# Patient Record
Sex: Female | Born: 1970 | Race: White | Hispanic: No | Marital: Married | Smoking: Never smoker
Health system: Southern US, Community
[De-identification: ages and names within clinical notes are randomized; demographics above are authoritative.]

## PROBLEM LIST (undated history)

## (undated) DIAGNOSIS — M549 Dorsalgia, unspecified: Secondary | ICD-10-CM

## (undated) DIAGNOSIS — R519 Headache, unspecified: Secondary | ICD-10-CM

## (undated) DIAGNOSIS — G43909 Migraine, unspecified, not intractable, without status migrainosus: Secondary | ICD-10-CM

## (undated) DIAGNOSIS — T7840XA Allergy, unspecified, initial encounter: Secondary | ICD-10-CM

## (undated) DIAGNOSIS — T8859XA Other complications of anesthesia, initial encounter: Secondary | ICD-10-CM

## (undated) DIAGNOSIS — M542 Cervicalgia: Secondary | ICD-10-CM

## (undated) DIAGNOSIS — I7300 Raynaud's syndrome without gangrene: Secondary | ICD-10-CM

## (undated) DIAGNOSIS — G4710 Hypersomnia, unspecified: Secondary | ICD-10-CM

## (undated) DIAGNOSIS — M5137 Other intervertebral disc degeneration, lumbosacral region: Secondary | ICD-10-CM

## (undated) DIAGNOSIS — H269 Unspecified cataract: Secondary | ICD-10-CM

## (undated) DIAGNOSIS — N189 Chronic kidney disease, unspecified: Secondary | ICD-10-CM

## (undated) DIAGNOSIS — I10 Essential (primary) hypertension: Secondary | ICD-10-CM

## (undated) DIAGNOSIS — L502 Urticaria due to cold and heat: Secondary | ICD-10-CM

## (undated) DIAGNOSIS — I499 Cardiac arrhythmia, unspecified: Secondary | ICD-10-CM

## (undated) DIAGNOSIS — R768 Other specified abnormal immunological findings in serum: Secondary | ICD-10-CM

## (undated) DIAGNOSIS — K469 Unspecified abdominal hernia without obstruction or gangrene: Secondary | ICD-10-CM

## (undated) DIAGNOSIS — D689 Coagulation defect, unspecified: Secondary | ICD-10-CM

## (undated) DIAGNOSIS — K219 Gastro-esophageal reflux disease without esophagitis: Secondary | ICD-10-CM

## (undated) DIAGNOSIS — K589 Irritable bowel syndrome without diarrhea: Secondary | ICD-10-CM

## (undated) DIAGNOSIS — M797 Fibromyalgia: Secondary | ICD-10-CM

## (undated) DIAGNOSIS — R7689 Other specified abnormal immunological findings in serum: Secondary | ICD-10-CM

## (undated) DIAGNOSIS — E876 Hypokalemia: Secondary | ICD-10-CM

## (undated) DIAGNOSIS — G8929 Other chronic pain: Secondary | ICD-10-CM

## (undated) DIAGNOSIS — M1990 Unspecified osteoarthritis, unspecified site: Secondary | ICD-10-CM

## (undated) DIAGNOSIS — D649 Anemia, unspecified: Secondary | ICD-10-CM

## (undated) DIAGNOSIS — F419 Anxiety disorder, unspecified: Secondary | ICD-10-CM

## (undated) DIAGNOSIS — Z8489 Family history of other specified conditions: Secondary | ICD-10-CM

## (undated) DIAGNOSIS — R42 Dizziness and giddiness: Secondary | ICD-10-CM

## (undated) DIAGNOSIS — N3010 Interstitial cystitis (chronic) without hematuria: Secondary | ICD-10-CM

## (undated) DIAGNOSIS — T782XXA Anaphylactic shock, unspecified, initial encounter: Secondary | ICD-10-CM

## (undated) DIAGNOSIS — Z889 Allergy status to unspecified drugs, medicaments and biological substances status: Secondary | ICD-10-CM

## (undated) DIAGNOSIS — F988 Other specified behavioral and emotional disorders with onset usually occurring in childhood and adolescence: Secondary | ICD-10-CM

## (undated) DIAGNOSIS — F429 Obsessive-compulsive disorder, unspecified: Secondary | ICD-10-CM

## (undated) DIAGNOSIS — E785 Hyperlipidemia, unspecified: Secondary | ICD-10-CM

## (undated) DIAGNOSIS — J45909 Unspecified asthma, uncomplicated: Secondary | ICD-10-CM

## (undated) DIAGNOSIS — M51379 Other intervertebral disc degeneration, lumbosacral region without mention of lumbar back pain or lower extremity pain: Secondary | ICD-10-CM

## (undated) DIAGNOSIS — K429 Umbilical hernia without obstruction or gangrene: Secondary | ICD-10-CM

## (undated) HISTORY — PX: SPINE SURGERY: SHX786

## (undated) HISTORY — PX: COMBINED HYSTEROSCOPY DIAGNOSTIC / D&C: SUR297

## (undated) HISTORY — DX: Allergy, unspecified, initial encounter: T78.40XA

## (undated) HISTORY — DX: Unspecified cataract: H26.9

## (undated) HISTORY — PX: ROTATOR CUFF REPAIR: SHX139

## (undated) HISTORY — PX: HAND SURGERY: SHX662

## (undated) HISTORY — PX: CERVICAL FUSION: SHX112

## (undated) HISTORY — DX: Coagulation defect, unspecified: D68.9

## (undated) HISTORY — PX: COLONOSCOPY: SHX174

## (undated) HISTORY — DX: Hypokalemia: E87.6

## (undated) HISTORY — PX: UPPER GASTROINTESTINAL ENDOSCOPY: SHX188

## (undated) HISTORY — DX: Anaphylactic shock, unspecified, initial encounter: T78.2XXA

## (undated) HISTORY — DX: Hyperlipidemia, unspecified: E78.5

## (undated) HISTORY — PX: LAPAROSCOPIC ENDOMETRIOSIS FULGURATION: SUR769

## (undated) HISTORY — PX: BLADDER SURGERY: SHX569

## (undated) HISTORY — PX: OTHER SURGICAL HISTORY: SHX169

---

## 1998-08-12 ENCOUNTER — Other Ambulatory Visit: Admission: RE | Admit: 1998-08-12 | Discharge: 1998-08-12 | Payer: Self-pay | Admitting: Obstetrics & Gynecology

## 1999-08-25 ENCOUNTER — Other Ambulatory Visit: Admission: RE | Admit: 1999-08-25 | Discharge: 1999-08-25 | Payer: Self-pay | Admitting: Obstetrics & Gynecology

## 1999-10-30 ENCOUNTER — Emergency Department (HOSPITAL_COMMUNITY): Admission: EM | Admit: 1999-10-30 | Discharge: 1999-10-31 | Payer: Self-pay

## 2000-07-31 ENCOUNTER — Encounter: Payer: Self-pay | Admitting: Emergency Medicine

## 2000-07-31 ENCOUNTER — Emergency Department (HOSPITAL_COMMUNITY): Admission: EM | Admit: 2000-07-31 | Discharge: 2000-07-31 | Payer: Self-pay | Admitting: Emergency Medicine

## 2000-09-08 ENCOUNTER — Other Ambulatory Visit: Admission: RE | Admit: 2000-09-08 | Discharge: 2000-09-08 | Payer: Self-pay | Admitting: Obstetrics & Gynecology

## 2000-10-21 ENCOUNTER — Encounter: Payer: Self-pay | Admitting: Emergency Medicine

## 2000-10-21 ENCOUNTER — Emergency Department (HOSPITAL_COMMUNITY): Admission: EM | Admit: 2000-10-21 | Discharge: 2000-10-21 | Payer: Self-pay | Admitting: Emergency Medicine

## 2000-12-06 ENCOUNTER — Emergency Department (HOSPITAL_COMMUNITY): Admission: EM | Admit: 2000-12-06 | Discharge: 2000-12-06 | Payer: Self-pay | Admitting: Emergency Medicine

## 2001-04-10 ENCOUNTER — Inpatient Hospital Stay (HOSPITAL_COMMUNITY): Admission: EM | Admit: 2001-04-10 | Discharge: 2001-04-12 | Payer: Self-pay | Admitting: *Deleted

## 2002-03-06 ENCOUNTER — Other Ambulatory Visit: Admission: RE | Admit: 2002-03-06 | Discharge: 2002-03-06 | Payer: Self-pay | Admitting: Obstetrics & Gynecology

## 2003-01-18 ENCOUNTER — Emergency Department (HOSPITAL_COMMUNITY): Admission: EM | Admit: 2003-01-18 | Discharge: 2003-01-18 | Payer: Self-pay | Admitting: Emergency Medicine

## 2009-01-15 ENCOUNTER — Emergency Department (HOSPITAL_BASED_OUTPATIENT_CLINIC_OR_DEPARTMENT_OTHER): Admission: EM | Admit: 2009-01-15 | Discharge: 2009-01-15 | Payer: Self-pay | Admitting: Emergency Medicine

## 2009-01-15 ENCOUNTER — Ambulatory Visit: Payer: Self-pay | Admitting: Radiology

## 2011-01-28 NOTE — H&P (Signed)
[facility]  Patient:    Samantha [name], Samantha [name] Visit Number: [id number] MRN: [id number]          Service Type: PSY Location: 30 [id number] [id number] Attending Physician:  Denny Peon Dictated by:   Candi Leash. Orsini, N.P. Admit Date:  04/09/2001 Discharge Date: 04/12/2001                     Psychiatric Admission Assessment  IDENTIFYING INFORMATION:  This is a 40 year old married white female, voluntarily admitted on April 09, 2001, for intentional overdose.  HISTORY OF PRESENT ILLNESS:  The patient presents with a history of intentional overdose on 6 Ativan, 7 Xanax, 1 Trazodone on April 09, 2001. Patient reports that she took her medicine because she was "tired of everyones mouth."  She wanted a peaceful sleep for about 12-15 hours. Her intention was not a suicide attempt.  The patient reports she has been having some depressive symptoms, financial concerns, been unemployed for the past 6 months, and grieving over 3 family deaths for the past year.  The patient has been feeling very overwhelmed.  Her sleeping and eating has been satisfactory. She denies any current suicidal or homicidal ideations.  She denies any auditory or visual hallucinations, no paranoia.  Patient is overwhelmed that her coworkers have gotten jobs and she has not, and is worried about getting fired.  PAST PSYCHIATRIC HISTORY:  This is the first admission to [facility].  She sees Dr. Isaac Bliss in [facility].  She was in [facility] in September 2001 for an overdose.  SOCIAL HISTORY:  She is a 40 year old married white female, married for 5 years with no children.  She lives with her husband.  She is out of work.  She has no legal problems.  She has completed high school and 3 years of college.  FAMILY HISTORY:  She has a grandfather and uncle with alcohol issues.  ALCOHOL DRUG HISTORY:  She is a nonsmoker.  She drinks 1-2 beers about 3 times a week, been drinking  since the age of 56, and marijuana use about 10 years ago.  PAST MEDICAL HISTORY:  Primary care Alic Hilburn is Dr. Piedad Climes in [place]. Medical problems are hypoglycemia, interstitial cystitis, irritable bowel syndrome, hiatal hernia and asthma.  MEDICATIONS: 1. Fluoxetine  20 mg 2 caps 3 times a day. 2. Maxifed G 550 CR 1 tab bid 3. Vioxx 25 mg 1 tab q.d. 4. Clarinex 5 mg 1 tab p.o. 5. Nexium 40 mg 1 q.d.  DRUG ALLERGIES:  EES.  PHYSICAL EXAMINATION:  Performed at [facility].  Blood sugar was 74, salicylate level was less than 4, acetaminophen level less than 10. CBC was within normal limits.  Urinalysis showed trace ketones.  Alcohol level was 68.  MENTAL STATUS EXAMINATION:  She is an alert, young adult, cooperative female with psychomotor retardation, fair eye contact, somewhat disheveled.  Speech is soft spoken, relevant, slow to answer.  Mood is depressed, affect is angry and sarcastic initially, then became more pleasant.  Patient was teary-eyed when she was speaking about the deaths in her family.  Thought processes are coherent.  There is no evidence of psychosis, no auditory or visual hallucinations, no suicidal or homicidal ideation, no paranoia.  Cognitively, she is oriented x 3.  Her memory is good, her judgment is impaired.  Insight is fair.  She has poor impulse control.  ADMISSION DIAGNOSES: Axis I:    1. Major depression.  2. Obsessive-compulsive disorder by history. Axis II:   Deferred. Axis III:  Asthma, gastroesophageal reflux disease and irritable bowel            syndrome, and interstitial cystitis. Axis IV:   Moderate with problems with primary support group, occupation,            and Nurse, children's. Axis V:    Current 40, estimated this past year 54.  INITIAL PLAN OF CARE:  Voluntary admission to [facility] for depression and intentional overdose.  Contract for safety, check every 15 minutes.  We will resume her  routine medications.  Patient is to attend groups.  Our goal is to decrease depressive symptoms so patient can be safe to follow up with Dr. Isaac Bliss in [facility].  TENTATIVE LENGTH OF STAY:  Two to three days. Dictated by:   Candi Leash. Orsini, N.P. Attending Physician:  Denny Peon DD:  05/29/01 TD:  05/29/01 Job: [id number]

## 2011-01-28 NOTE — Discharge Summary (Signed)
[facility]  Patient:    Samantha [name], BUZZELL Visit Number: [id number] MRN: [id number]          Service Type: PSY Location: 30 0302 02 Attending Physician:  Denny Peon Dictated by:   Netta Cedars, M.D. Admit Date:  04/09/2001 Discharge Date: 04/12/2001                             Discharge Summary  HISTORY OF PRESENT ILLNESS:  The patient is a 40 year old, white, married female who was admitted on voluntary paper after intentional overdose on six tablets of Ativan, seven tablets of Xanax, and one tablet of Trazodone on April 09, 2001.  She wanted "peaceful sleep," but denied suicidal ideations. She is not sure whether she was aware that dose of medication could hurt her, but she strongly denies that hurting herself was intended by her action.  PAST MEDICAL HISTORY:  Historically, she was under treatment with Roselind Messier, M.D., at the [facility] and was hospitalized at one point after a serious overdose at [facility] in September of 2001. Medically she suffers from hypoglycemia, interstitial cystitis, and hiatal hernia.  The patient gives a history suggestive for some obsessive and compulsive symptoms.  MEDICATIONS PRIOR TO ADMISSION:  Prozac 40 mg daily, Vioxx, Clarinex, and Nexium.  PHYSICAL EXAMINATION:  The physical examination done at the [facility] Emergency Department was normal.  The salicylate level was lower than 4.  The patient was kept several hours in the emergency room and then transferred to our unit since she became medically clear.  HOSPITAL COURSE:  After admitting to the ward, the patient was placed on special observation.  She strongly denied suicidal thoughts.  She was evidently frustrated, having a hard time finding a new job.  She also listed several incidents of death within her family which supposedly triggered current depression reaction.  The patient also suffered from chronic  pain related to cystitis and she had surgery of the bladder in the past.  On April 10, 2001, she had a lengthy session with me.  She recognizes depressed for over a month, but not to the point of wanting to kill herself.  She felt simply exhausted.  She felt that she took more medications than necessary in order to put herself to sleep.  She complained of having a "traffic jam" in her head and reported occasional auditory hallucinations like somebody calling her name.  I decided to try Zyprexa 2.5 mg at bedtime.  Later on on April 11, 2001, the patient told that she felt too sleepy and overdrugged on Zyprexa, but denied having any more hallucinations or racing thoughts.  Review of her history once again brought some notion of "mood swings."  I decided to start her on Neurontin, which could help both with anxiety and thus a mood stabilizer.  On April 11, 2001, the patient had a successful meeting, a family session with her husband in the presence of the case worker.  Both clients the patient felt that she was ready for discharge.  On April 12, 2001, she reported tolerating Neurontin okay.  Still some difficulties with initial insomnia, but she sleeps about seven hours.  Affect was bright.  She reported more even moods.  No hallucinations.  No dangerous ideations.  The patient agreed for the plan to discharge her to an intensive outpatient program. Review of blood work showed normal FHA, tyroid function tests, and chemistry. A urine pregnancy  test was negative.  At the time of hospitalization, the patient did not have symptoms of cystitis.  DISCHARGE DIAGNOSES: Axis I.    1. Major depression, recurrent with psychotic features, moderate to               severe.            2. Rule out bipolar disorder, type 2, depressed.            3. Obsessive-compulsive disorder, improved.            4. Rule out attention deficit disorder, adult type. Axis II.   No diagnosis. Axis III.  1. Interstitial  cystitis.            2. Arthritis.            3. Gastroesophageal reflux disease. Axis IV.   Psychosocial stressors:  Moderate stresses, medical problems,            social problems, and financial problems. Axis V.    Global assessment of functioning on admission of 35, upon discharge            of 60, and maximum for the past year of 65.  DISCHARGE MEDICATIONS: 1. Prozac 40 mg daily. 2. Neurontin 100 mg one in the morning and at noon and two at night. 3. Protonix 40 mg twice a day. 4. Nasonex spray daily. 5. Vioxx 25 mg daily. 6. Xanax 1 mg half of a tablet up to twice a day as needed for anxiety.  SPECIAL INSTRUCTIONS:  The patients husband will secure her medications.  She should call for recurrence of symptoms or problems with these medications. The side effects of medications were explained to her.  FOLLOW-UP:  The patient has a follow-up appointment with Roselind Messier, M.D., on May 02, 2001, at 10:15 a.m.  She prefers this over an intensive outpatient program. Dictated by:   Netta Cedars, M.D. Attending Physician:  Denny Peon DD:  05/29/01 TD:  05/29/01 Job: [id number]

## 2011-06-22 ENCOUNTER — Other Ambulatory Visit: Payer: Self-pay | Admitting: Orthopedic Surgery

## 2011-06-22 ENCOUNTER — Encounter (HOSPITAL_BASED_OUTPATIENT_CLINIC_OR_DEPARTMENT_OTHER)
Admission: RE | Admit: 2011-06-22 | Discharge: 2011-06-22 | Disposition: A | Discharge status: Home / Self Care | Payer: PRIVATE HEALTH INSURANCE | Source: Ambulatory Visit | Attending: Orthopedic Surgery | Admitting: Orthopedic Surgery

## 2011-06-22 ENCOUNTER — Ambulatory Visit
Admission: RE | Admit: 2011-06-22 | Discharge: 2011-06-22 | Disposition: A | Discharge status: Home / Self Care | Payer: PRIVATE HEALTH INSURANCE | Source: Ambulatory Visit | Attending: Orthopedic Surgery | Admitting: Orthopedic Surgery

## 2011-06-22 DIAGNOSIS — Z01811 Encounter for preprocedural respiratory examination: Secondary | ICD-10-CM

## 2011-06-22 LAB — BASIC METABOLIC PANEL
CO2: 31 mEq/L (ref 19–32)
Calcium: 10 mg/dL (ref 8.4–10.5)
Creatinine, Ser: 0.67 mg/dL (ref 0.50–1.10)
GFR calc Af Amer: >90 mL/min (ref >90)
Sodium: 138 mEq/L (ref 135–145)

## 2011-06-24 ENCOUNTER — Other Ambulatory Visit: Payer: Self-pay | Admitting: Orthopedic Surgery

## 2011-06-24 ENCOUNTER — Ambulatory Visit (HOSPITAL_BASED_OUTPATIENT_CLINIC_OR_DEPARTMENT_OTHER)
Admission: RE | Admit: 2011-06-24 | Discharge: 2011-06-24 | Disposition: A | Discharge status: Home / Self Care | Payer: PRIVATE HEALTH INSURANCE | Source: Ambulatory Visit | Attending: Orthopedic Surgery | Admitting: Orthopedic Surgery

## 2011-06-24 DIAGNOSIS — I7300 Raynaud's syndrome without gangrene: Secondary | ICD-10-CM | POA: Insufficient documentation

## 2011-06-24 DIAGNOSIS — Z01818 Encounter for other preprocedural examination: Secondary | ICD-10-CM | POA: Insufficient documentation

## 2011-06-24 DIAGNOSIS — Z01812 Encounter for preprocedural laboratory examination: Secondary | ICD-10-CM | POA: Insufficient documentation

## 2011-06-24 DIAGNOSIS — R229 Localized swelling, mass and lump, unspecified: Secondary | ICD-10-CM | POA: Insufficient documentation

## 2011-06-24 DIAGNOSIS — IMO0001 Reserved for inherently not codable concepts without codable children: Secondary | ICD-10-CM | POA: Insufficient documentation

## 2011-06-24 DIAGNOSIS — Z01810 Encounter for preprocedural cardiovascular examination: Secondary | ICD-10-CM | POA: Insufficient documentation

## 2011-06-24 LAB — POCT HEMOGLOBIN-HEMACUE: Hemoglobin: 13.6 g/dL (ref 12.0–15.0)

## 2011-06-29 NOTE — Op Note (Signed)
NAMEMarland [name]  ADDISON, FREIMUTH    ACCOUNT NO.:  [id number]  MEDICAL RECORD NO.:  [id number]  LOCATION:                                 FACILITY:  PHYSICIAN:  Katy Fitch. Anikka Marsan, M.D. DATE OF BIRTH:  12/04/1970  DATE OF PROCEDURE:  06/24/2011 DATE OF DISCHARGE:                              OPERATIVE REPORT   PREOPERATIVE DIAGNOSIS:  Uncomfortable mass, left long finger, A1-A2 pulley junction of flexor retinaculum.  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  OPERATION:  Excisional biopsy of mass, left long finger, A1-A2 pulley junction of flexor retinaculum.  OPERATING SURGEON:  Katy Fitch. Micaiah Remillard, MD  ASSISTANT:  Marveen Reeks Dasnoit, PA-C  ANESTHESIA:  General sedation/2% lidocaine metacarpal head level block and flexor sheath block of left long finger.  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  She was self-referred for evaluation of painful lumps and swelling over the fingers of her right and left hands.  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  She has a history of "arthritis." She has Raynaud phenomenon and advised that she has fibromyalgia.  She has multiple drug allergies.  She is followed by Dr. Derek Jack for primary care.  She presented for evaluation of tender nodule type masses over the left long finger A1-A2 pulley junction and in the region of her right ring finger.  We advised that we could proceed with excisional biopsy for diagnosis and hopeful resolution of this predicament.  With her history of arthritis, it is possible that these represent areas of synovitis or inflammatory cell collection.  After informed consent, she is brought to the operating at this time.  The goal of surgery is to relieve her pain and the mass effect.  Sharlon initially contemplated bilateral surgeries at a single setting and after  deliberating her choice decided to address one hand at a time, so that she would have a hand for self-care.  PROCEDURE IN DETAIL:  Makayli Johnson-Coleman was brought to room 1 of the [facility] and placed in supine position on the operating table.  Following an anesthesia informed consent with Dr. Gelene Mink, monitored anesthesia care was recommended and accepted.  Under Dr. Thornton Dales supervision, IV sedation was provided.  The left hand was prepped with Betadine soap solution and sterilely draped. Lidocaine 2% was infiltrated in the path of the intended incision and the flexor sheath.  After 5 minutes, excellent anesthesia was achieved.  After routine surgical time-out, the left hand and arm were exsanguinated with an Esmarch bandage and the arterial tourniquet on the proximal left brachium inflated to 220 mmHg.  The procedure commenced with a oblique Brunner incision exposing the flexor sheath.  Subcutaneous tissues were carefully divided revealing a moderate degree of edema in the subcutaneous region.  The flexor sheath was encountered with identification of proximal margin of the A2 pulley, and with careful dissection on the central and ulnar aspect of the A1-A2 pulley junction, we identified a gray inflammatory mass that appeared to be a form of synovitis extruding through the flexor sheath.  This raises the question of an inflammatory arthritis disorder versus a possible infectious predicament.  We did not have  much tissue to culture and as it was solid, we elected to send the mass for pathologic evaluation with specific attention to inflammatory conditions such as rheumatoid arthritis or other inflammatory disorders.  If Shaakira has an inflammatory biopsy report, she is contemplating surgery to the right hand for similar predicament.  If we operate on the right ring finger, we will be able to send biopsy specimens for culture.  She has multiple drug  allergies including multiple antibiotics.  We will wait a pathologic result prior to proceeding with attempted treatment of this predicament pending pathologic evaluation.  For aftercare, she is provided hydrocodone/Vicodin 5/325 one p.o. q.4-6 hours p.r.n. pain, 20 tablets without refill.  We will see her back for followup in 7 days for dressing change, discussion of pathologic results, and probable suture removal.     Katy Fitch. Misk Galentine, M.D.     RVS/MEDQ  D:  06/24/2011  T:  06/24/2011  Job:  [id number]  cc:   Debbora Dus, MD  Electronically Signed by Josephine Igo M.D. on 06/29/2011 07:55:53 AM

## 2011-07-15 ENCOUNTER — Ambulatory Visit (HOSPITAL_BASED_OUTPATIENT_CLINIC_OR_DEPARTMENT_OTHER)
Admission: RE | Admit: 2011-07-15 | Discharge: 2011-07-15 | Disposition: A | Discharge status: Home / Self Care | Payer: PRIVATE HEALTH INSURANCE | Source: Ambulatory Visit | Attending: Orthopedic Surgery | Admitting: Orthopedic Surgery

## 2011-07-15 DIAGNOSIS — M65849 Other synovitis and tenosynovitis, unspecified hand: Secondary | ICD-10-CM | POA: Insufficient documentation

## 2011-07-15 DIAGNOSIS — M65839 Other synovitis and tenosynovitis, unspecified forearm: Secondary | ICD-10-CM | POA: Insufficient documentation

## 2011-07-15 DIAGNOSIS — M6530 Trigger finger, unspecified finger: Secondary | ICD-10-CM | POA: Insufficient documentation

## 2011-07-15 DIAGNOSIS — M6740 Ganglion, unspecified site: Secondary | ICD-10-CM | POA: Insufficient documentation

## 2011-07-15 DIAGNOSIS — I10 Essential (primary) hypertension: Secondary | ICD-10-CM | POA: Insufficient documentation

## 2011-07-17 NOTE — Op Note (Signed)
Samantha [name], Samantha [name]    ACCOUNT NO.:  [id number]  MEDICAL RECORD NO.:  [id number]  LOCATION:                               FACILITY:  [facility]  PHYSICIAN:  Katy Fitch. Dennys Guin, M.D. DATE OF BIRTH:  [birth year]  DATE OF PROCEDURE:  07/15/2011 DATE OF DISCHARGE:                              OPERATIVE REPORT   PREOPERATIVE DIAGNOSES: 1. 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 2. Painful mass, right ring finger A2 pulley, radial aspect causing     compression of radial proper digital nerve with grasp prehension.  POSTOPERATIVE DIAGNOSES: 1. 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 2. Painful mass, right ring finger A2 pulley, radial aspect causing     compression of radial proper digital nerve with grasp prehension.  OPERATION: 1. Release of right ring finger A1 pulley. 2. Through separate incision is excision of A2 myxoid cyst,     decompressing radial proper digital nerve.  OPERATING SURGEON:  Katy Fitch. Carinna Newhart, MD  ASSISTANT:  Marveen Reeks Dasnoit, PA  ANESTHESIA:  2% lidocaine, metacarpal head level block, and flexor sheath block of right ring finger, supplemented by IV sedation.  SUPERVISING ANESTHESIOLOGIST:  Dr. Gelene Mink  INDICATIONS:  Samantha [name] is a 40 year old woman referred for evaluation and management of bilateral hand discomfort and masses. She is status post correction of her left hand predicament.  She now presents for management of her right ring finger symptoms.  She was noted to have triggering of right ring finger, particularly in the morning.  She has a history of Raynaud phenomenon and possible inflammatory arthropathy.  She has a history also of a painful 4-mm diameter mass on the radial aspect of the A2 pulley irritating her right ring finger radial neurovascular bundle.  She requested correction of both predicaments.  Preoperatively, she was reminded of the potential risks, benefits of surgery.   Risks include infection, neurovascular injury, failure to relieve all her symptoms, and the possibility of developing another cyst.  Potential benefits would be pain relief, improved hand function, and relief of triggering.  Questions were invited and answered in detail.  Preoperatively, she was interviewed in the holding area where proper operative site identification protocol was followed and her finger was marked.  Informed consent was accomplished by Dr. Gelene Mink regarding anesthetic choices.  Monitored anesthesia care was recommended and accepted.  In room 1 under Dr. Thornton Dales direct supervision, Ms. Tamala Julian was sedated.  Her right arm was then prepped with Betadine soap  and solution, sterilely draped.  Following exsanguination of the right arm with Esmarch bandage, an arterial tourniquet on the proximal brachium was inflated to 250 mmHg.  After routine surgical time-out, procedure commenced with an oblique incision directly over the A1 pulley of the right ring finger.  Subcutaneous tissues were carefully divided, placing four blunt Ragnell retractors, allowing exposure of the A1 pulley.  The pulley was split with scalpel and scissors.  The tendons were, otherwise, normal.  The wound was then repaired with intradermal 3-0 Prolene. Attention was directed to the A2 pulley where a short oblique incision was fashioned directly over the palpable mass.  Subcutaneous tissues were carefully divided taking care to identify the radial neurovascular bundle.  Again, four blunt Ragnell retractors were  placed for exposure followed by removal of the cyst with a micro rongeur.  The cyst was removed intact followed by debridement of its base with the micro rongeur.  This wound was likewise repaired with intradermal 3-0 Prolene.  Compressive dressing was applied with Xeroflo, sterile gauze, and Coban. There were no apparent complications.  For aftercare, [name]- [name] will  keep her wounds dry.  She will work on immediate range of motion exercise.  We will see her back for followup in our office in a week for suture removal and initiation of a postop rehabilitation program.     Katy Fitch. Johari Pinney, M.D.     RVS/MEDQ  D:  07/15/2011  T:  07/16/2011  Job:  [id number]  Electronically Signed by Josephine Igo M.D. on 07/17/2011 06:00:14 AM

## 2011-10-31 ENCOUNTER — Other Ambulatory Visit: Payer: Self-pay | Admitting: Orthopaedic Surgery

## 2011-10-31 ENCOUNTER — Other Ambulatory Visit (HOSPITAL_COMMUNITY): Payer: Self-pay | Admitting: Orthopaedic Surgery

## 2011-10-31 DIAGNOSIS — M25562 Pain in left knee: Secondary | ICD-10-CM

## 2011-11-07 ENCOUNTER — Ambulatory Visit (HOSPITAL_COMMUNITY)
Admission: RE | Admit: 2011-11-07 | Discharge: 2011-11-07 | Disposition: A | Discharge status: Home / Self Care | Payer: Commercial Managed Care - PPO | Source: Ambulatory Visit | Attending: Orthopaedic Surgery | Admitting: Orthopaedic Surgery

## 2011-11-07 DIAGNOSIS — M25569 Pain in unspecified knee: Secondary | ICD-10-CM | POA: Insufficient documentation

## 2011-11-07 DIAGNOSIS — M25469 Effusion, unspecified knee: Secondary | ICD-10-CM | POA: Insufficient documentation

## 2011-11-07 DIAGNOSIS — M25562 Pain in left knee: Secondary | ICD-10-CM

## 2011-11-25 ENCOUNTER — Other Ambulatory Visit: Payer: Self-pay

## 2011-11-25 ENCOUNTER — Emergency Department (HOSPITAL_BASED_OUTPATIENT_CLINIC_OR_DEPARTMENT_OTHER)
Admission: EM | Admit: 2011-11-25 | Discharge: 2011-11-25 | Disposition: A | Discharge status: Home / Self Care | Payer: Commercial Managed Care - PPO | Attending: Emergency Medicine | Admitting: Emergency Medicine

## 2011-11-25 ENCOUNTER — Encounter (HOSPITAL_BASED_OUTPATIENT_CLINIC_OR_DEPARTMENT_OTHER): Payer: Self-pay | Admitting: *Deleted

## 2011-11-25 DIAGNOSIS — M79609 Pain in unspecified limb: Secondary | ICD-10-CM | POA: Insufficient documentation

## 2011-11-25 DIAGNOSIS — Z79899 Other long term (current) drug therapy: Secondary | ICD-10-CM | POA: Insufficient documentation

## 2011-11-25 DIAGNOSIS — IMO0001 Reserved for inherently not codable concepts without codable children: Secondary | ICD-10-CM | POA: Insufficient documentation

## 2011-11-25 DIAGNOSIS — I7300 Raynaud's syndrome without gangrene: Secondary | ICD-10-CM | POA: Insufficient documentation

## 2011-11-25 DIAGNOSIS — I10 Essential (primary) hypertension: Secondary | ICD-10-CM | POA: Insufficient documentation

## 2011-11-25 DIAGNOSIS — F411 Generalized anxiety disorder: Secondary | ICD-10-CM | POA: Insufficient documentation

## 2011-11-25 DIAGNOSIS — K219 Gastro-esophageal reflux disease without esophagitis: Secondary | ICD-10-CM | POA: Insufficient documentation

## 2011-11-25 DIAGNOSIS — R0789 Other chest pain: Secondary | ICD-10-CM | POA: Insufficient documentation

## 2011-11-25 DIAGNOSIS — F988 Other specified behavioral and emotional disorders with onset usually occurring in childhood and adolescence: Secondary | ICD-10-CM | POA: Insufficient documentation

## 2011-11-25 HISTORY — DX: Other specified behavioral and emotional disorders with onset usually occurring in childhood and adolescence: F98.8

## 2011-11-25 HISTORY — DX: Irritable bowel syndrome, unspecified: K58.9

## 2011-11-25 HISTORY — DX: Fibromyalgia: M79.7

## 2011-11-25 HISTORY — DX: Gastro-esophageal reflux disease without esophagitis: K21.9

## 2011-11-25 HISTORY — DX: Obsessive-compulsive disorder, unspecified: F42.9

## 2011-11-25 HISTORY — DX: Unspecified osteoarthritis, unspecified site: M19.90

## 2011-11-25 HISTORY — DX: Dorsalgia, unspecified: M54.9

## 2011-11-25 HISTORY — DX: Dizziness and giddiness: R42

## 2011-11-25 HISTORY — DX: Cervicalgia: M54.2

## 2011-11-25 HISTORY — DX: Essential (primary) hypertension: I10

## 2011-11-25 HISTORY — DX: Unspecified abdominal hernia without obstruction or gangrene: K46.9

## 2011-11-25 HISTORY — DX: Raynaud's syndrome without gangrene: I73.00

## 2011-11-25 HISTORY — DX: Other chronic pain: G89.29

## 2011-11-25 LAB — COMPREHENSIVE METABOLIC PANEL
Alkaline Phosphatase: 61 U/L (ref 39–117)
BUN: 11 mg/dL (ref 6–23)
Calcium: 10 mg/dL (ref 8.4–10.5)
GFR calc Af Amer: >90 mL/min (ref >90)
GFR calc non Af Amer: >90 mL/min (ref >90)
Glucose, Bld: 86 mg/dL (ref 70–99)
Total Protein: 7.5 g/dL (ref 6.0–8.3)

## 2011-11-25 LAB — DIFFERENTIAL
Eosinophils Absolute: 0 10*3/uL (ref 0.0–0.7)
Eosinophils Relative: 0 % (ref 0–5)
Lymphs Abs: 2.5 10*3/uL (ref 0.7–4.0)
Monocytes Absolute: 0.5 10*3/uL (ref 0.1–1.0)
Monocytes Relative: 4 % (ref 3–12)

## 2011-11-25 LAB — CBC
MCHC: 35.2 g/dL (ref 30.0–36.0)
WBC: 13 10*3/uL — ABNORMAL HIGH (ref 4.0–10.5)

## 2011-11-25 LAB — TROPONIN I: Troponin I: <0.3 ng/mL (ref <0.30)

## 2011-11-25 NOTE — Discharge Instructions (Signed)
Chest Pain (Nonspecific) It is often hard to give a specific diagnosis for the cause of chest pain. There is always a chance that your pain could be related to something serious, such as a heart attack or a blood clot in the lungs. You need to follow up with your caregiver for further evaluation. CAUSES   Heartburn.   Pneumonia or bronchitis.   Anxiety or stress.   Inflammation around your heart (pericarditis) or lung (pleuritis or pleurisy).   A blood clot in the lung.   A collapsed lung (pneumothorax). It can develop suddenly on its own (spontaneous pneumothorax) or from injury (trauma) to the chest.   Shingles infection (herpes zoster virus).  The chest wall is composed of bones, muscles, and cartilage. Any of these can be the source of the pain.  The bones can be bruised by injury.   The muscles or cartilage can be strained by coughing or overwork.   The cartilage can be affected by inflammation and become sore (costochondritis).  DIAGNOSIS  Lab tests or other studies, such as X-rays, electrocardiography, stress testing, or cardiac imaging, may be needed to find the cause of your pain.  TREATMENT   Treatment depends on what may be causing your chest pain. Treatment may include:   Acid blockers for heartburn.   Anti-inflammatory medicine.   Pain medicine for inflammatory conditions.   Antibiotics if an infection is present.   You may be advised to change lifestyle habits. This includes stopping smoking and avoiding alcohol, caffeine, and chocolate.   You may be advised to keep your head raised (elevated) when sleeping. This reduces the chance of acid going backward from your stomach into your esophagus.   Most of the time, nonspecific chest pain will improve within 2 to 3 days with rest and mild pain medicine.  HOME CARE INSTRUCTIONS   If antibiotics were prescribed, take your antibiotics as directed. Finish them even if you start to feel better.   For the next few  days, avoid physical activities that bring on chest pain. Continue physical activities as directed.   Do not smoke.   Avoid drinking alcohol.   Only take over-the-counter or prescription medicine for pain, discomfort, or fever as directed by your caregiver.   Follow your caregiver's suggestions for further testing if your chest pain does not go away.   Keep any follow-up appointments you made. If you do not go to an appointment, you could develop lasting (chronic) problems with pain. If there is any problem keeping an appointment, you must call to reschedule.  SEEK MEDICAL CARE IF:   You think you are having problems from the medicine you are taking. Read your medicine instructions carefully.   Your chest pain does not go away, even after treatment.   You develop a rash with blisters on your chest.  SEEK IMMEDIATE MEDICAL CARE IF:   You have increased chest pain or pain that spreads to your arm, neck, jaw, back, or abdomen.   You develop shortness of breath, an increasing cough, or you are coughing up blood.   You have severe back or abdominal pain, feel nauseous, or vomit.   You develop severe weakness, fainting, or chills.   You have a fever.  THIS IS AN EMERGENCY. Do not wait to see if the pain will go away. Get medical help at once. Call your local emergency services (911 in [place] Do not drive yourself to the hospital. MAKE SURE YOU:   Understand these instructions.     Will watch your condition.   Will get help right away if you are not doing well or get worse.  Document Released: 06/08/2005 Document Revised: 08/18/2011 Document Reviewed: 04/03/2008 [employer] Patient Information 2012 [employer]. 

## 2011-11-25 NOTE — ED Provider Notes (Signed)
History     CSN: [id number]  Arrival date & time 11/25/11  7829   First MD Initiated Contact with Patient 11/25/11 [id number]      Chief Complaint  Patient presents with  . Chest Pain    (Consider location/radiation/quality/duration/timing/severity/associated sxs/prior treatment) HPI Comments: This began after some stressful events that occurred at school her autistic child attends.  Patient is a 41 y.o. female presenting with chest pain. The history is provided by the patient.  Chest Pain The chest pain began yesterday. Chest pain occurs constantly. The chest pain is unchanged. The pain is associated with stress. The severity of the pain is moderate. The quality of the pain is described as tightness. The pain radiates to the left arm. Chest pain is worsened by certain positions and stress. Pertinent negatives for primary symptoms include no fever, no shortness of breath, no cough, no palpitations, no nausea and no vomiting. Treatments tried: valium. Risk factors: htn.  Her past medical history is significant for anxiety/panic attacks.  Her family medical history is significant for CAD in family.  Procedure history is negative for cardiac catheterization, stress echo and exercise treadmill test.     Past Medical History  Diagnosis Date  . IBS (irritable bowel syndrome)   . GERD (gastroesophageal reflux disease)   . Hernia   . Fibromyalgia   . Raynaud disease   . Arthritis   . Chronic neck pain   . Endometriosis   . Vertigo   . ADD (attention deficit disorder)   . OCD (obsessive compulsive disorder)   . Hypertension   . Chronic back pain     Past Surgical History  Procedure Date  . Bladder surgery   . Cervical fusion   . Rotator cuff repair   . Cesarean section   . Hand surgery     No family history on file.  History  Substance Use Topics  . Smoking status: Not on file  . Smokeless tobacco: Not on file  . Alcohol Use:     OB History    Grav Para Term Preterm  Abortions TAB SAB Ect Mult Living                  Review of Systems  Constitutional: Negative for fever.  Respiratory: Negative for cough and shortness of breath.   Cardiovascular: Positive for chest pain. Negative for palpitations.  Gastrointestinal: Negative for nausea and vomiting.  All other systems reviewed and are negative.    Allergies  Aleve; Bactrim; Cymbalta; Erythromycin; Hydrocodone-homatropine; Levaquin; Meloxicam; and Tetracyclines & related  Home Medications   Current Outpatient Rx  Name Route Sig Dispense Refill  . AMPHETAMINE-DEXTROAMPHET ER 10 MG PO CP24 Oral Take 10 mg by mouth every morning.    . ATENOLOL-CHLORTHALIDONE 50-25 MG PO TABS Oral Take 1 tablet by mouth daily.    Marland Kitchen BUTALBITAL-APAP-CAFF-COD 50-325-40-30 MG PO CAPS Oral Take 1 capsule by mouth every 4 (four) hours as needed.    Marland Kitchen VITAMIN D 1000 UNITS PO TABS Oral Take by mouth Nightly.    . CYCLOBENZAPRINE HCL 10 MG PO TABS Oral Take 10 mg by mouth 3 (three) times daily as needed.    Marland Kitchen DIAZEPAM 10 MG PO TABS Oral Take 10 mg by mouth every 6 (six) hours as needed.    Marland Kitchen HYDROCODONE-ACETAMINOPHEN 5-500 MG PO TABS Oral Take 1 tablet by mouth every 6 (six) hours as needed.    Marland Kitchen HYDROMORPHONE HCL 4 MG PO TABS Oral Take 4 mg by  mouth every 4 (four) hours as needed.    Marland Kitchen NORTRIPTYLINE HCL 50 MG PO CAPS Oral Take 50 mg by mouth at bedtime.    . SERTRALINE HCL 100 MG PO TABS Oral Take 100 mg by mouth Nightly.      BP 116/76  Pulse 76  Temp(Src) 97.7 F (36.5 C) (Oral)  Resp 20  Ht 5\' 3"  (1.6 m)  Wt 145 lb (65.772 kg)  BMI 25.69 kg/m2  SpO2 100%  Physical Exam  Nursing note and vitals reviewed. Constitutional: She is oriented to person, place, and time. She appears well-developed and well-nourished. No distress.  HENT:  Head: Normocephalic and atraumatic.  Neck: Normal range of motion. Neck supple.  Cardiovascular: Normal rate and regular rhythm.  Exam reveals no gallop and no friction rub.   No  murmur heard. Pulmonary/Chest: Effort normal and breath sounds normal. No respiratory distress. She has no wheezes.  Abdominal: Soft. Bowel sounds are normal. She exhibits no distension. There is no tenderness.  Musculoskeletal: Normal range of motion.  Neurological: She is alert and oriented to person, place, and time.  Skin: Skin is warm and dry. She is not diaphoretic.    ED Course  Procedures (including critical care time)  Labs Reviewed - No data to display No results found.   No diagnosis found.   Date: 11/25/2011  Rate: 77  Rhythm: normal sinus rhythm  QRS Axis: normal  Intervals: normal  ST/T Wave abnormalities: normal  Conduction Disutrbances:none  Narrative Interpretation:   Old EKG Reviewed: unchanged    MDM  The ekg, labs are normal and the symptoms are atypical for cardiac pain.  Her presentation is more consistent with anxiety/panic attack.  She will discharged with follow up with pcp in the next week if not improving.  To return prn if she worsens.        Geoffery Lyons, MD 11/25/11 1125

## 2011-11-25 NOTE — ED Notes (Signed)
Secondary Assessment- Pt states she took PO valium last night and PTA without relief. Denies nausea, diaphoresis or SHOB however states she doesn't want to take a deep breath.  Lungs CTA throughout.

## 2012-07-17 ENCOUNTER — Encounter: Payer: Self-pay | Admitting: Internal Medicine

## 2012-07-17 ENCOUNTER — Ambulatory Visit (INDEPENDENT_AMBULATORY_CARE_PROVIDER_SITE_OTHER): Payer: Commercial Managed Care - PPO | Admitting: Internal Medicine

## 2012-07-17 VITALS — BP 111/78 | HR 86 | Temp 97.5°F | Ht 63.0 in | Wt 153.0 lb

## 2012-07-17 DIAGNOSIS — A4902 Methicillin resistant Staphylococcus aureus infection, unspecified site: Secondary | ICD-10-CM

## 2012-07-17 MED ORDER — MUPIROCIN 2 % EX OINT: TOPICAL_OINTMENT | Freq: Two times a day (BID) | CUTANEOUS | Status: DC

## 2012-07-17 NOTE — Progress Notes (Signed)
  Subjective:    Patient ID: Samantha [name], female    DOB: Oct 25, 1970, 41 y.o.   MRN: [id number]  HPI She comes in as a new patient. She has a history of multiple medication allergies and intolerances.  She was sent by her hand surgeon by her request to see her prior to consideration of surgery. She had a documented MRSA abscess infection of the right cheek in the right ear. It has now cleared up. She did take antibiotics and used over-the-counter therapy. She's never had this before. She had no fever or chills. She also relates that her son has had it recently as well twice. She is also going to a allergist in reference to her multiple allergies. She did have pictures available of the infection. Culture was resistant to urethra myosin, sensitive to Cipro, tetracycline and clindamycin, though no D test reported.    Review of Systems  Constitutional: Negative for fever, chills and fatigue.  Gastrointestinal: Negative for nausea and diarrhea.  Skin: Negative for rash.  Hematological: Negative for adenopathy.  Psychiatric/Behavioral: Negative for dysphoric mood. The patient is not nervous/anxious.        Objective:   Physical Exam  Constitutional: She appears well-developed and well-nourished. No distress.  HENT:       Well-healed lesion of her right cheek          Assessment & Plan:

## 2012-07-17 NOTE — Assessment & Plan Note (Addendum)
Her infection has now resolved. I did give her instructions for decolonization with mupirocin. She will return as needed. With active MRSA, perioperative prophylaxis should be with vancomycin. More than 30 minutes spent including counseling and review of allergies with pharmacy.

## 2012-07-17 NOTE — Addendum Note (Signed)
Addended by: Gardiner Barefoot on: 07/17/2012 11:42 AM   Modules accepted: Level of Service

## 2012-07-19 NOTE — Progress Notes (Signed)
Thank you for forwarding this note. We will prophylax accordingly.  Rob Tour manager

## 2012-08-17 ENCOUNTER — Other Ambulatory Visit: Payer: Self-pay | Admitting: Orthopedic Surgery

## 2012-08-22 ENCOUNTER — Encounter (HOSPITAL_BASED_OUTPATIENT_CLINIC_OR_DEPARTMENT_OTHER): Payer: Self-pay | Admitting: *Deleted

## 2012-08-22 NOTE — Progress Notes (Signed)
Pt here 11/12 for other finger cysts Multiple allergies-fibromyalgia-IC,no cardiac-asthma

## 2012-08-22 NOTE — H&P (Signed)
  Samantha [name] is an 41 y.o. female.   Chief Complaint: c/o mass left long finger HPI: Patient is a 41 y/o female who presented 07/04/12 for evaluation of a mass overlying the flexor sheath of the left long finger for about 1 month prior to presentation. No history of any trauma. She has pain with direct palpation. No numbness or tingling.   Past Medical History  Diagnosis Date  . IBS (irritable bowel syndrome)   . GERD (gastroesophageal reflux disease)   . Hernia   . Fibromyalgia   . Raynaud disease   . Arthritis   . Chronic neck pain   . Endometriosis   . Vertigo   . ADD (attention deficit disorder)   . OCD (obsessive compulsive disorder)   . Hypertension   . Chronic back pain   . Multiple allergies   . IC (interstitial cystitis)   . Asthma     Past Surgical History  Procedure Date  . Bladder surgery   . Cervical fusion   . Rotator cuff repair   . Cesarean section   . Hand surgery     No family history on file. Social History:  reports that she has never smoked. She has never used smokeless tobacco. She reports that she does not drink alcohol or use illicit drugs.  Allergies:  Allergies  Allergen Reactions  . Bactrim Anaphylaxis  . Cymbalta (Duloxetine Hcl) Shortness Of Breath  . Adhesive (Tape)   . Doxycycline     Possibly joint pain or itch   . Erythromycin     Upset stomach  . Hydrocodone-Homatropine     Not true allergy. "irritated"  . Levofloxacin     Weakness, myalgia  . Lorazepam Other (See Comments)    Hallucination  . Meloxicam Itching  . Naproxen Sodium Itching  . Omeprazole-Sodium Bicarbonate     Chest pain-sore all over-HA  . Ranitidine     Severe joint pain  . Tetracyclines & Related   . Neosporin (Neomycin-Bacitracin Zn-Polymyx) Rash    No prescriptions prior to admission    No results found for this or any previous visit (from the past 48 hour(s)).  No results found.   Pertinent items are noted in HPI.  Height 5\' 3"   (1.6 m), weight 65.772 kg (145 lb).  General appearance: alert Head: Normocephalic, without obvious abnormality Neck: supple, symmetrical, trachea midline Resp: clear to auscultation bilaterally Cardio: regular rate and rhythm GI: normal findings: bowel sounds normal Extremities: Exam reveals cystic mass overlying the A-2 pulley of the left long finger. She has FROM of the digits. She has pain with direct palpation. Pulses: 2+ and symmetric Skin: mobility and turgor normal Neurologic: Grossly normal    Assessment/Plan Impression: Left long finger A-2 pulley cyst  Plan: To the OR for excision A-2 pulley cyst left long finger.The procedure, risks,benefits and post-op course were discussed with the patient at length and they were in agreement with the plan.   DASNOIT,Argus Caraher J 08/22/2012, 4:39 PM    H&P documentation: 08/23/2012  -History and Physical Reviewed  -Patient has been re-examined  -No change in the plan of care  Wyn Forster, MD

## 2012-08-23 ENCOUNTER — Encounter (HOSPITAL_BASED_OUTPATIENT_CLINIC_OR_DEPARTMENT_OTHER)
Admission: RE | Disposition: A | Discharge status: Home / Self Care | Payer: Self-pay | Source: Ambulatory Visit | Attending: Orthopedic Surgery

## 2012-08-23 ENCOUNTER — Encounter (HOSPITAL_BASED_OUTPATIENT_CLINIC_OR_DEPARTMENT_OTHER): Payer: Self-pay | Admitting: Anesthesiology

## 2012-08-23 ENCOUNTER — Ambulatory Visit (HOSPITAL_BASED_OUTPATIENT_CLINIC_OR_DEPARTMENT_OTHER)
Admission: RE | Admit: 2012-08-23 | Discharge: 2012-08-23 | Disposition: A | Discharge status: Home / Self Care | Payer: Commercial Managed Care - PPO | Source: Ambulatory Visit | Attending: Orthopedic Surgery | Admitting: Orthopedic Surgery

## 2012-08-23 ENCOUNTER — Encounter (HOSPITAL_BASED_OUTPATIENT_CLINIC_OR_DEPARTMENT_OTHER): Payer: Self-pay | Admitting: *Deleted

## 2012-08-23 ENCOUNTER — Ambulatory Visit (HOSPITAL_BASED_OUTPATIENT_CLINIC_OR_DEPARTMENT_OTHER): Payer: Commercial Managed Care - PPO | Admitting: Anesthesiology

## 2012-08-23 ENCOUNTER — Encounter (HOSPITAL_BASED_OUTPATIENT_CLINIC_OR_DEPARTMENT_OTHER): Payer: Self-pay | Admitting: Certified Registered"

## 2012-08-23 DIAGNOSIS — J45909 Unspecified asthma, uncomplicated: Secondary | ICD-10-CM | POA: Insufficient documentation

## 2012-08-23 DIAGNOSIS — K219 Gastro-esophageal reflux disease without esophagitis: Secondary | ICD-10-CM | POA: Insufficient documentation

## 2012-08-23 DIAGNOSIS — Z881 Allergy status to other antibiotic agents status: Secondary | ICD-10-CM | POA: Insufficient documentation

## 2012-08-23 DIAGNOSIS — Z8614 Personal history of Methicillin resistant Staphylococcus aureus infection: Secondary | ICD-10-CM | POA: Insufficient documentation

## 2012-08-23 DIAGNOSIS — Z981 Arthrodesis status: Secondary | ICD-10-CM | POA: Insufficient documentation

## 2012-08-23 DIAGNOSIS — IMO0001 Reserved for inherently not codable concepts without codable children: Secondary | ICD-10-CM | POA: Insufficient documentation

## 2012-08-23 DIAGNOSIS — I10 Essential (primary) hypertension: Secondary | ICD-10-CM | POA: Insufficient documentation

## 2012-08-23 DIAGNOSIS — N3010 Interstitial cystitis (chronic) without hematuria: Secondary | ICD-10-CM | POA: Insufficient documentation

## 2012-08-23 DIAGNOSIS — M6740 Ganglion, unspecified site: Secondary | ICD-10-CM | POA: Insufficient documentation

## 2012-08-23 DIAGNOSIS — F988 Other specified behavioral and emotional disorders with onset usually occurring in childhood and adolescence: Secondary | ICD-10-CM | POA: Insufficient documentation

## 2012-08-23 DIAGNOSIS — M129 Arthropathy, unspecified: Secondary | ICD-10-CM | POA: Insufficient documentation

## 2012-08-23 DIAGNOSIS — I7300 Raynaud's syndrome without gangrene: Secondary | ICD-10-CM | POA: Insufficient documentation

## 2012-08-23 HISTORY — DX: Unspecified asthma, uncomplicated: J45.909

## 2012-08-23 HISTORY — DX: Interstitial cystitis (chronic) without hematuria: N30.10

## 2012-08-23 HISTORY — PX: LESION REMOVAL: SHX5196

## 2012-08-23 HISTORY — DX: Allergy status to unspecified drugs, medicaments and biological substances: Z88.9

## 2012-08-23 LAB — POCT I-STAT, CHEM 8
BUN: 16 mg/dL (ref 6–23)
Calcium, Ion: 1.2 mmol/L (ref 1.12–1.23)
Chloride: 100 mEq/L (ref 96–112)
Glucose, Bld: 86 mg/dL (ref 70–99)
HCT: 36 % (ref 36.0–46.0)
Potassium: 3.5 mEq/L (ref 3.5–5.1)

## 2012-08-23 SURGERY — EXCISION, LESION, HAND
Anesthesia: General | Site: Finger | Laterality: Left | Wound class: Clean

## 2012-08-23 MED ORDER — PROPOFOL 10 MG/ML IV EMUL
INTRAVENOUS | Status: DC | PRN
  Administered 2012-08-23: 100 ug/kg/min via INTRAVENOUS

## 2012-08-23 MED ORDER — FENTANYL CITRATE 0.05 MG/ML IJ SOLN
INTRAMUSCULAR | Status: DC | PRN
  Administered 2012-08-23: 100 ug via INTRAVENOUS

## 2012-08-23 MED ORDER — MIDAZOLAM HCL 5 MG/5ML IJ SOLN
INTRAMUSCULAR | Status: DC | PRN
  Administered 2012-08-23: 2 mg via INTRAVENOUS

## 2012-08-23 MED ORDER — CHLORHEXIDINE GLUCONATE 4 % EX LIQD: 60.0000 mL | Freq: Once | CUTANEOUS | Status: DC

## 2012-08-23 MED ORDER — FENTANYL CITRATE 0.05 MG/ML IJ SOLN: 25.0000 ug | INTRAMUSCULAR | Status: DC | PRN

## 2012-08-23 MED ORDER — LIDOCAINE HCL 2 % IJ SOLN
INTRAMUSCULAR | Status: DC | PRN
  Administered 2012-08-23: 3 mL

## 2012-08-23 MED ORDER — LIDOCAINE HCL (CARDIAC) 20 MG/ML IV SOLN
INTRAVENOUS | Status: DC | PRN
  Administered 2012-08-23: 2 mg via INTRAVENOUS

## 2012-08-23 MED ORDER — ONDANSETRON HCL 4 MG/2ML IJ SOLN
INTRAMUSCULAR | Status: DC | PRN
  Administered 2012-08-23: 4 mg via INTRAVENOUS

## 2012-08-23 MED ORDER — LACTATED RINGERS IV SOLN
INTRAVENOUS | Status: DC
Start: 2012-08-23 — End: 2012-08-23
  Administered 2012-08-23: 10:00:00 via INTRAVENOUS

## 2012-08-23 SURGICAL SUPPLY — 48 items
BANDAGE ADHESIVE 1X3 (GAUZE/BANDAGES/DRESSINGS) IMPLANT
BANDAGE ELASTIC 3 VELCRO ST LF (GAUZE/BANDAGES/DRESSINGS) IMPLANT
BANDAGE GAUZE ELAST BULKY 4 IN (GAUZE/BANDAGES/DRESSINGS) IMPLANT
BLADE MINI RND TIP GREEN BEAV (BLADE) IMPLANT
BLADE SURG 15 STRL LF DISP TIS (BLADE) ×1 IMPLANT
BLADE SURG 15 STRL SS (BLADE) ×2
BNDG CMPR 9X4 STRL LF SNTH (GAUZE/BANDAGES/DRESSINGS) ×1
BNDG CMPR MD 5X2 ELC HKLP STRL (GAUZE/BANDAGES/DRESSINGS) ×1
BNDG ELASTIC 2 VLCR STRL LF (GAUZE/BANDAGES/DRESSINGS) ×2 IMPLANT
BNDG ESMARK 4X9 LF (GAUZE/BANDAGES/DRESSINGS) ×2 IMPLANT
BRUSH SCRUB EZ PLAIN DRY (MISCELLANEOUS) ×2 IMPLANT
CLOTH BEACON ORANGE TIMEOUT ST (SAFETY) ×2 IMPLANT
CORDS BIPOLAR (ELECTRODE) IMPLANT
COVER MAYO STAND STRL (DRAPES) ×2 IMPLANT
COVER TABLE BACK 60X90 (DRAPES) ×2 IMPLANT
CUFF TOURNIQUET SINGLE 18IN (TOURNIQUET CUFF) ×1 IMPLANT
DECANTER SPIKE VIAL GLASS SM (MISCELLANEOUS) IMPLANT
DRAPE EXTREMITY T 121X128X90 (DRAPE) ×2 IMPLANT
DRAPE SURG 17X23 STRL (DRAPES) ×2 IMPLANT
GLOVE BIO SURGEON STRL SZ 6.5 (GLOVE) ×2 IMPLANT
GLOVE BIOGEL M STRL SZ7.5 (GLOVE) ×2 IMPLANT
GLOVE EXAM NITRILE EXT CUFF MD (GLOVE) ×1 IMPLANT
GLOVE ORTHO TXT STRL SZ7.5 (GLOVE) ×2 IMPLANT
GOWN BRE IMP PREV XXLGXLNG (GOWN DISPOSABLE) ×4 IMPLANT
GOWN PREVENTION PLUS XLARGE (GOWN DISPOSABLE) ×2 IMPLANT
NDL SAFETY ECLIPSE 18X1.5 (NEEDLE) ×1 IMPLANT
NEEDLE 27GAX1X1/2 (NEEDLE) ×2 IMPLANT
NEEDLE HYPO 18GX1.5 SHARP (NEEDLE) ×2
PACK BASIN DAY SURGERY FS (CUSTOM PROCEDURE TRAY) ×2 IMPLANT
PAD CAST 3X4 CTTN HI CHSV (CAST SUPPLIES) IMPLANT
PADDING CAST ABS 4INX4YD NS (CAST SUPPLIES)
PADDING CAST ABS COTTON 4X4 ST (CAST SUPPLIES) IMPLANT
PADDING CAST COTTON 3X4 STRL (CAST SUPPLIES)
SPLINT PLASTER CAST XFAST 3X15 (CAST SUPPLIES) IMPLANT
SPLINT PLASTER XTRA FASTSET 3X (CAST SUPPLIES)
SPONGE GAUZE 4X4 12PLY (GAUZE/BANDAGES/DRESSINGS) ×2 IMPLANT
STOCKINETTE 4X48 STRL (DRAPES) ×2 IMPLANT
STRIP CLOSURE SKIN 1/2X4 (GAUZE/BANDAGES/DRESSINGS) ×2 IMPLANT
SUT PROLENE 3 0 PS 2 (SUTURE) IMPLANT
SUT PROLENE 4 0 P 3 18 (SUTURE) ×2 IMPLANT
SUT VIC AB 4-0 P-3 18XBRD (SUTURE) IMPLANT
SUT VIC AB 4-0 P3 18 (SUTURE)
SYR 3ML 23GX1 SAFETY (SYRINGE) IMPLANT
SYR CONTROL 10ML LL (SYRINGE) ×2 IMPLANT
TOWEL OR 17X24 6PK STRL BLUE (TOWEL DISPOSABLE) ×4 IMPLANT
TRAY DSU PREP LF (CUSTOM PROCEDURE TRAY) ×2 IMPLANT
UNDERPAD 30X30 INCONTINENT (UNDERPADS AND DIAPERS) ×2 IMPLANT
WATER STERILE IRR 1000ML POUR (IV SOLUTION) ×1 IMPLANT

## 2012-08-23 NOTE — Transfer of Care (Signed)
Immediate Anesthesia Transfer of Care Note  Patient: Samantha [name]  Procedure(s) Performed: Procedure(s) (LRB) with comments: LESION REMOVAL HAND (Left)  Patient Location: PACU  Anesthesia Type:MAC  Level of Consciousness: awake, alert , oriented and patient cooperative  Airway & Oxygen Therapy: Patient Spontanous Breathing and Patient connected to face mask oxygen  Post-op Assessment: Report given to PACU RN and Post -op Vital signs reviewed and stable  Post vital signs: Reviewed and stable  Complications: No apparent anesthesia complications

## 2012-08-23 NOTE — Anesthesia Postprocedure Evaluation (Signed)
  Anesthesia Post-op Note  Patient: Samantha [name]  Procedure(s) Performed: Procedure(s) (LRB) with comments: LESION REMOVAL HAND (Left)  Patient Location: PACU  Anesthesia Type:MAC  Level of Consciousness: awake  Airway and Oxygen Therapy: Patient Spontanous Breathing  Post-op Pain: mild  Post-op Assessment: Post-op Vital signs reviewed  Post-op Vital Signs: Reviewed  Complications: No apparent anesthesia complications

## 2012-08-23 NOTE — Anesthesia Preprocedure Evaluation (Signed)
Anesthesia Evaluation  Patient identified by MRN, date of birth, ID band Patient awake    Reviewed: Allergy & Precautions, H&P , NPO status , Patient's Chart, lab work & pertinent test results  Airway Mallampati: II      Dental   Pulmonary neg pulmonary ROS, asthma ,  breath sounds clear to auscultation        Cardiovascular hypertension, Pt. on medications + Peripheral Vascular Disease Rhythm:Regular Rate:Normal     Neuro/Psych Anxiety    GI/Hepatic Neg liver ROS, GERD-  ,  Endo/Other  negative endocrine ROS  Renal/GU negative Renal ROS     Musculoskeletal  (+) Fibromyalgia -  Abdominal   Peds  Hematology   Anesthesia Other Findings   Reproductive/Obstetrics                           Anesthesia Physical Anesthesia Plan  ASA: III  Anesthesia Plan: General   Post-op Pain Management:    Induction: Intravenous  Airway Management Planned: LMA  Additional Equipment:   Intra-op Plan:   Post-operative Plan: Extubation in OR  Informed Consent: I have reviewed the patients History and Physical, chart, labs and discussed the procedure including the risks, benefits and alternatives for the proposed anesthesia with the patient or authorized representative who has indicated his/her understanding and acceptance.   Dental advisory given  Plan Discussed with: CRNA, Anesthesiologist and Surgeon  Anesthesia Plan Comments:         Anesthesia Quick Evaluation

## 2012-08-23 NOTE — Op Note (Signed)
[id number] 

## 2012-08-23 NOTE — Brief Op Note (Signed)
08/23/2012  11:56 AM  PATIENT:  Samantha [name]  41 y.o. female  PRE-OPERATIVE DIAGNOSIS:  cyst left long finger A2  POST-OPERATIVE DIAGNOSIS:  cyst left long finger A2  PROCEDURE:  Excision of A-2 pulley myxoid cyst of left long finger  SURGEON:  Surgeon(s) and Role:    * Wyn Forster., MD - Primary  PHYSICIAN ASSISTANT:   ASSISTANTS: Mallory Shirk.A-C    ANESTHESIA:   IV sedation  EBL:  Total I/O In: 200 [I.V.:200] Out: -   BLOOD ADMINISTERED:none  DRAINS: none   LOCAL MEDICATIONS USED:  XYLOCAINE   SPECIMEN:  No Specimen  DISPOSITION OF SPECIMEN:  N/A  COUNTS:  YES  TOURNIQUET:   Total Tourniquet Time Documented: Forearm (Left) - 7 minutes  DICTATION: .Other Dictation: Dictation Number [id number]  PLAN OF CARE: Discharge to home after PACU  PATIENT DISPOSITION:  PACU - hemodynamically stable.

## 2012-08-23 NOTE — Anesthesia Procedure Notes (Signed)
Procedure Name: MAC Date/Time: 08/23/2012 11:30 AM Performed by: Verlan Friends Pre-anesthesia Checklist: Patient identified, Timeout performed, Emergency Drugs available, Suction available and Patient being monitored Patient Re-evaluated:Patient Re-evaluated prior to inductionOxygen Delivery Method: Simple face mask Placement Confirmation: positive ETCO2

## 2012-08-24 ENCOUNTER — Encounter (HOSPITAL_BASED_OUTPATIENT_CLINIC_OR_DEPARTMENT_OTHER): Payer: Self-pay | Admitting: Orthopedic Surgery

## 2012-08-24 NOTE — Op Note (Signed)
NAMEMarland Kitchen  Samantha, [name]    ACCOUNT NO.:  [id number]  MEDICAL RECORD NO.:  [id number]  LOCATION:                                 FACILITY:  PHYSICIAN:  Katy Fitch. Marciel Offenberger, M.D. DATE OF BIRTH:  1971/08/26  DATE OF PROCEDURE:  08/23/2012 DATE OF DISCHARGE:                              OPERATIVE REPORT   PREOPERATIVE DIAGNOSIS:  Painful, enlarging myxoid cyst, left long finger A2 pulley.  POSTOPERATIVE DIAGNOSIS:  Painful, enlarging myxoid cyst, left long finger A2 pulley.  OPERATION:  Excision of A2 pulley myxoid cyst.  OPERATING SURGEON:  Katy Fitch. Jade Burright, M.D.  ASSISTANT:  Marveen Reeks. Dasnoit, PA-C  ANESTHESIA:  2% lidocaine metacarpal head level block of left long finger supplemented by IV sedation.  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  She has a history of a painful mass developing in her left long finger flexor sheath.  We have treated her in the past for similar predicaments in other fingers.  Samantha [name] has a complex past medical history.  She is currently being evaluated by Dr. Laurette Schimke for multiple environmental and drug allergies.  She had a recent MRSA infection, which postpone surgery.  She now presents for relief of her mass in the left long finger.  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  Preoperatively, she was interviewed in the holding area.  Her current list of allergies is influx.  Dr. Lucie Leather had performed extensive testing and informed Samantha [name] that she did not have any allergies that he could document through skin testing.  On the trial in her basis, she still maintains an extensive list of medications and environmental stimulants that caused her to have an untoward response.  At the present  time, she will not require prophylactic antibiotics.  PROCEDURE:  Samantha [name] was brought to room #2 of the [facility] and placed in supine position on the operating table.  Following IV sedation under the supervision of Dr. Randa Evens, the left hand was prepped with Betadine soap and solution, and sterilely draped. A pneumatic tourniquet was applied to the proximal forearm.  A 2% lidocaine was infiltrated in the path of the intended incision and around the digital nerves of the long finger.  After few moments, excellent anesthesia was achieved.  The left hand and arm were then prepped with Betadine soap and solution, and sterilely draped. Following exsanguination by direct compression, the arterial tourniquet on the proximal forearm was inflated to 220 mmHg.  Procedure commenced with a routine surgical time-out.  We then performed a short oblique incision ending at the proximal finger flexion crease.  Subcutaneous tissues were meticulously dissected retracting the ulnar neurovascular bundle.  The flexor sheath was identified and a 1-cm diameter cyst encountered.  After four Ragnell retractors were placed, the cyst was drained of its contents, removed from the flexor sheath and its base, curetted down to the level of the flexor tendon.  Bleeding points were not problematic.  The wound was repaired with intradermal 4-0 Prolene.  A compressive dressing was applied with Xeroflo, sterile gauze, and a  compression wrap.  For aftercare, Samantha [name] is going to use hydromorphone 4 mg tablets that she has at home for chronic pain management.  We will see her back for followup in our office in 1 week for suture removal.     Katy Fitch. Bj Morlock, M.D.     RVS/MEDQ  D:  08/23/2012  T:  08/24/2012  Job:  [id number]

## 2013-04-01 ENCOUNTER — Telehealth: Payer: Self-pay | Admitting: *Deleted

## 2013-04-01 NOTE — Telephone Encounter (Signed)
Pt.notified

## 2013-04-01 NOTE — Telephone Encounter (Signed)
Pt left message requesting an appointment, states she was bitten by a bug over the weekend. The bit is now red with induration, pain, and is hot to the touch.  She would like to know if she should follow up with [facility] or her PCP.  Pt last seen in 07/2012 for evaluation of MRSA prior to a surgical procedure. Andree Coss, RN

## 2013-04-01 NOTE — Telephone Encounter (Signed)
PCP?

## 2013-07-30 ENCOUNTER — Ambulatory Visit (HOSPITAL_COMMUNITY): Payer: Commercial Managed Care - PPO | Admitting: Psychiatry

## 2014-07-09 DIAGNOSIS — Z8614 Personal history of Methicillin resistant Staphylococcus aureus infection: Secondary | ICD-10-CM | POA: Insufficient documentation

## 2014-07-31 DIAGNOSIS — J45909 Unspecified asthma, uncomplicated: Secondary | ICD-10-CM | POA: Insufficient documentation

## 2014-07-31 DIAGNOSIS — L719 Rosacea, unspecified: Secondary | ICD-10-CM | POA: Insufficient documentation

## 2014-07-31 DIAGNOSIS — M791 Myalgia, unspecified site: Secondary | ICD-10-CM | POA: Insufficient documentation

## 2014-07-31 DIAGNOSIS — H8110 Benign paroxysmal vertigo, unspecified ear: Secondary | ICD-10-CM | POA: Insufficient documentation

## 2014-07-31 DIAGNOSIS — M25579 Pain in unspecified ankle and joints of unspecified foot: Secondary | ICD-10-CM | POA: Insufficient documentation

## 2014-07-31 DIAGNOSIS — M51379 Other intervertebral disc degeneration, lumbosacral region without mention of lumbar back pain or lower extremity pain: Secondary | ICD-10-CM | POA: Insufficient documentation

## 2014-07-31 DIAGNOSIS — G8929 Other chronic pain: Secondary | ICD-10-CM | POA: Insufficient documentation

## 2014-07-31 DIAGNOSIS — M549 Dorsalgia, unspecified: Secondary | ICD-10-CM | POA: Insufficient documentation

## 2014-07-31 DIAGNOSIS — R200 Anesthesia of skin: Secondary | ICD-10-CM | POA: Insufficient documentation

## 2014-07-31 DIAGNOSIS — M5137 Other intervertebral disc degeneration, lumbosacral region: Secondary | ICD-10-CM | POA: Insufficient documentation

## 2014-07-31 DIAGNOSIS — G5760 Lesion of plantar nerve, unspecified lower limb: Secondary | ICD-10-CM | POA: Insufficient documentation

## 2014-07-31 DIAGNOSIS — I10 Essential (primary) hypertension: Secondary | ICD-10-CM | POA: Insufficient documentation

## 2014-07-31 DIAGNOSIS — R102 Pelvic and perineal pain: Secondary | ICD-10-CM

## 2014-07-31 DIAGNOSIS — M5481 Occipital neuralgia: Secondary | ICD-10-CM | POA: Insufficient documentation

## 2014-11-18 ENCOUNTER — Encounter: Payer: Self-pay | Admitting: Neurology

## 2014-11-18 DIAGNOSIS — F5111 Primary hypersomnia: Secondary | ICD-10-CM | POA: Insufficient documentation

## 2014-11-18 DIAGNOSIS — M5410 Radiculopathy, site unspecified: Secondary | ICD-10-CM | POA: Insufficient documentation

## 2014-11-18 DIAGNOSIS — M5416 Radiculopathy, lumbar region: Secondary | ICD-10-CM | POA: Insufficient documentation

## 2014-11-18 DIAGNOSIS — G4700 Insomnia, unspecified: Secondary | ICD-10-CM | POA: Insufficient documentation

## 2014-11-18 DIAGNOSIS — G894 Chronic pain syndrome: Secondary | ICD-10-CM | POA: Insufficient documentation

## 2014-11-18 DIAGNOSIS — M5010 Cervical disc disorder with radiculopathy, unspecified cervical region: Secondary | ICD-10-CM | POA: Insufficient documentation

## 2014-11-19 ENCOUNTER — Ambulatory Visit (INDEPENDENT_AMBULATORY_CARE_PROVIDER_SITE_OTHER): Payer: Commercial Managed Care - PPO | Admitting: Neurology

## 2014-11-19 ENCOUNTER — Encounter: Payer: Self-pay | Admitting: Neurology

## 2014-11-19 VITALS — BP 100/70 | HR 66 | Resp 12 | Ht 63.5 in | Wt 151.0 lb

## 2014-11-19 DIAGNOSIS — G5601 Carpal tunnel syndrome, right upper limb: Secondary | ICD-10-CM

## 2014-11-19 DIAGNOSIS — G4700 Insomnia, unspecified: Secondary | ICD-10-CM | POA: Diagnosis not present

## 2014-11-19 DIAGNOSIS — M5416 Radiculopathy, lumbar region: Secondary | ICD-10-CM

## 2014-11-19 DIAGNOSIS — M5010 Cervical disc disorder with radiculopathy, unspecified cervical region: Secondary | ICD-10-CM | POA: Diagnosis not present

## 2014-11-19 DIAGNOSIS — F5111 Primary hypersomnia: Secondary | ICD-10-CM

## 2014-11-19 DIAGNOSIS — G5600 Carpal tunnel syndrome, unspecified upper limb: Secondary | ICD-10-CM | POA: Insufficient documentation

## 2014-11-19 MED ORDER — HYDROMORPHONE HCL 4 MG PO TABS: 4.0000 mg | ORAL_TABLET | Freq: Three times a day (TID) | ORAL | Status: DC | PRN

## 2014-11-19 MED ORDER — AMPHETAMINE-DEXTROAMPHETAMINE 10 MG PO TABS: 10.0000 mg | ORAL_TABLET | Freq: Every day | ORAL | Status: DC

## 2014-11-19 MED ORDER — FENTANYL 25 MCG/HR TD PT72: 25.0000 ug | MEDICATED_PATCH | TRANSDERMAL | Status: DC

## 2014-11-19 MED ORDER — DIAZEPAM 10 MG PO TABS: ORAL_TABLET | ORAL | Status: DC

## 2014-11-19 NOTE — Progress Notes (Signed)
[facility]  PATIENT: Samantha [name] DOB: 05/27/1971  REFERRING DOCTOR OR PCP:  Dr. Jesse Fall SOURCE: Patient and [facility] is external give me to sleepiness and pain sleepiness ogy records  _________________________________   HISTORICAL  CHIEF COMPLAINT:  Chief Complaint  Patient presents with  . Insomnia  . ADHD  . Fibromyalgia    HISTORY OF PRESENT ILLNESS:  Samantha [name] is a 44 year old woman with excessive daytime sleepiness and lower back and leg pain. I had seen her in the past at [facility] and she is transitioning care to [facility] neurology.  Daytime sleepiness: She had a long history of constant sleepiness despite getting 8-12 hours of sleep most nights. Despite being sleepy during the day she will often have insomnia at night. Overnight PSG showed delayed sleep onset but was otherwise normal. MSLT showed mildly reduced mean sleep latency of 9.6 minutes without any sleep onset REM.   She was placed on stimulants with Adderall 20 mg in the morning and 10 mg around noon. Some days, she takes just 20 mg or takes 15 - 15 mg.   That helped her excessive daytime sleepiness but she continued to have difficulty falling asleep at night.    There is some trouble falling asleep at night.   She notes attention deficit as well and some difficulties coming up with the right words.     Sleep studies:   PSG 06/27/2013 showed no significant OSA (AHI = 1).  She had moderately severe snoring. There is no significant periodic limb movements of sleep. Sleep efficiency was 68% and she had a delay sleep latency. She had normal amount of slow wave N3 sleep but no REM sleep.   MSLT 11/29/2013 was abnormal with a slightly reduced mean sleep latency of 9.6 minutes.   There was no sleep onset REM sleep. 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  Low back and leg pain: MRI report was reviewed from 08/09/2011. It shows L4-L5 and  L5-S1 degenerative disc disease but no definite nerve root compression.  Neck pain/cervical radiculopathy:  She reports neck pain and pain that goes down the right arm to the fingers.   She has had a C5-C6 fusion in the past. MRI report from 12/19/2013 was reviewed.  It showed C5-C6 fusion and also showed C6-C7 > C4-C5 disc protrusion.  Chronic pain management: She is on fentanyl 25 g patches and hydromorphone 4 mg 3 times a day. She sometimes will cut fentanyl in 1/2 or 1/3 of pain is better.  She does not take all  the  3 hydromorphone's every day.   She is also on diazepam 5 mg 3 times a day. In the past, she has had lumbar and cervical ESI's with short term benefit.  Hand numbness:   The NCV was reportedly negative but the injections and wearing a brace helped.  REVIEW OF SYSTEMS: Constitutional: No fevers, chills, sweats, or change in appetite.   Sleepiness Eyes: No visual changes, double vision, eye pain.   Eyes itch at times Ear, nose and throat: No hearing loss, ear pain, nasal congestion, sore throat Cardiovascular: No chest pain, palpitations Respiratory: No shortness of breath at rest or with exertion.   No wheezes GastrointestinaI: No nausea, vomiting, diarrhea, abdominal pain, fecal incontinence Genitourinary: No dysuria, urinary retention or frequency.  No nocturia. Musculoskeletal: as above Integumentary: No rash, pruritus, skin lesions Neurological: as above Psychiatric: No depression at this time.  No anxiety Endocrine: No palpitations, diaphoresis,  change in appetite, change in weigh.  Reports increased thirst Hematologic/Lymphatic: No anemia, purpura, petechiae. Allergic/Immunologic: No itchy/runny eyes, nasal congestion, recent allergic reactions, rashes  ALLERGIES: Allergies  Allergen Reactions  . Bactrim Anaphylaxis  . Cymbalta [Duloxetine Hcl] Shortness Of Breath  . Adhesive [Tape]   . Doxycycline     Possibly joint pain or itch   . Erythromycin     Upset  stomach  . Hydrocodone-Homatropine     Not true allergy. "irritated"  . Levofloxacin     Weakness, myalgia  . Lorazepam Other (See Comments)    Hallucination  . Meloxicam Itching  . Naproxen Sodium Itching  . Omeprazole-Sodium Bicarbonate     Chest pain-sore all over-HA  . Ranitidine     Severe joint pain  . Tetracyclines & Related   . Neosporin [Neomycin-Bacitracin Zn-Polymyx] Rash    HOME MEDICATIONS:  Current outpatient prescriptions:  .  albuterol (PROVENTIL HFA;VENTOLIN HFA) 108 (90 BASE) MCG/ACT inhaler, Inhale 2 puffs into the lungs every 6 (six) hours as needed., Disp: , Rfl:  .  amphetamine-dextroamphetamine (ADDERALL XR) 10 MG 24 hr capsule, Take 10 mg by mouth every morning., Disp: , Rfl:  .  atenolol-chlorthalidone (TENORETIC) 50-25 MG per tablet, Take 1 tablet by mouth daily., Disp: , Rfl:  .  azelastine (ASTELIN) 137 MCG/SPRAY nasal spray, Place 1 spray into the nose every evening. Use in each nostril as directed, Disp: , Rfl:  .  butalbital-acetaminophen-caffeine (FIORICET WITH CODEINE) 50-325-40-30 MG per capsule, Take 1 capsule by mouth every 4 (four) hours as needed., Disp: , Rfl:  .  cholecalciferol (VITAMIN D) 1000 UNITS tablet, Take by mouth Nightly., Disp: , Rfl:  .  cyclobenzaprine (FLEXERIL) 10 MG tablet, Take 10 mg by mouth 3 (three) times daily as needed., Disp: , Rfl:  .  diazepam (VALIUM) 10 MG tablet, Take 10 mg by mouth every 6 (six) hours as needed., Disp: , Rfl:  .  fluticasone (FLONASE) 50 MCG/ACT nasal spray, Place 2 sprays into the nose daily., Disp: , Rfl:  .  fluticasone (FLOVENT HFA) 110 MCG/ACT inhaler, Inhale 1 puff into the lungs 2 (two) times daily., Disp: , Rfl:  .  HYDROmorphone (DILAUDID) 4 MG tablet, Take 4 mg by mouth every 4 (four) hours as needed., Disp: , Rfl:  .  mupirocin ointment (BACTROBAN) 2 %, Apply topically 2 (two) times daily., Disp: 30 g, Rfl: 0 .  sertraline (ZOLOFT) 100 MG tablet, Take 150 mg by mouth Nightly. , Disp: ,  Rfl:   PAST MEDICAL HISTORY: Past Medical History  Diagnosis Date  . IBS (irritable bowel syndrome)   . GERD (gastroesophageal reflux disease)   . Hernia   . Fibromyalgia   . Raynaud disease   . Arthritis   . Chronic neck pain   . Endometriosis   . Vertigo   . ADD (attention deficit disorder)   . OCD (obsessive compulsive disorder)   . Hypertension   . Chronic back pain   . Multiple allergies   . IC (interstitial cystitis)   . Asthma     PAST SURGICAL HISTORY: Past Surgical History  Procedure Laterality Date  . Bladder surgery    . Cervical fusion    . Rotator cuff repair    . Cesarean section    . Hand surgery    . Lesion removal  08/23/2012    Procedure: LESION REMOVAL HAND;  Surgeon: Cammie Sickle., MD;  Location: [facility];  Service: Orthopedics;  Laterality:  Left;    FAMILY HISTORY: History reviewed. No pertinent family history.  SOCIAL HISTORY:  History   Social History  . Marital Status: Married    Spouse Name: N/A  . Number of Children: N/A  . Years of Education: N/A   Occupational History  . Not on file.   Social History Main Topics  . Smoking status: Never Smoker   . Smokeless tobacco: Never Used  . Alcohol Use: No  . Drug Use: No  . Sexual Activity: Not on file   Other Topics Concern  . Not on file   Social History Narrative     PHYSICAL EXAM  Filed Vitals:   11/19/14 0904  BP: 100/70  Pulse: 66  Resp: 12  Height: 5' 3.5" (1.613 m)  Weight: 151 lb (68.493 kg)    Body mass index is 26.33 kg/(m^2).   General: The patient is well-developed and well-nourished and in no acute distress  Neck: The neck is supple, no carotid bruits are noted.  The neck is tender.  Cardiovascular: The heart has a regular rate and rhythm with a normal S1 and S2. There were no murmurs, gallops or rubs. Lungs are clear to auscultation.  Skin: Extremities are without significant edema.  Musculoskeletal:  Back is tender.   Tender  over cerv paraspinals and many FMS tender points  Neurologic Exam  Mental status: The patient is alert and oriented x 3 at the time of the examination. The patient has apparent normal recent and remote memory, with an apparently normal attention span and concentration ability.   Speech is normal.  Cranial nerves: Extraocular movements are full.  Facial symmetry is present. Reports dec left facial sensation to soft touch.  .Facial strength is normal.  Trapezius and sternocleidomastoid strength is normal. No dysarthria is noted.  The tongue is midline, and the patient has symmetric elevation of the soft palate.  Motor:  Muscle bulk is normal.   Tone is normal. Strength is  5 / 5 in all 4 extremities except 4+/5 right APB.   Sensory: Sensory testing shows reduced right thenar eminence sensation and intact to pinprick, soft touch and vibration sensation elsewhere.  Coordination: Cerebellar testing reveals good finger-nose-finger and heel-to-shin bilaterally.  Gait and station: Station is normal.   Gait is normal. Tandem gait is normal. Romberg is negative.   Reflexes: Deep tendon reflexes are symmetric and normal bilaterally.   Plantar responses are flexor.    DIAGNOSTIC DATA (LABS, IMAGING, TESTING) - I reviewed patient records, labs, notes, testing and imaging myself where available.      ASSESSMENT AND PLAN  Primary hypersomnia  Insomnia  Lumbar radicular pain  Cervical disc disorder with radiculopathy of cervical region  Carpal tunnel syndrome of right wrist   1.  Refill Adderall for hypersomnia 2.  Refill fentanyl/dilaudid for pain 3.  Change valium to 5 mg prn anxiety qd and 10 mg nightly to help insomnia better 4.  Where right wrist splint at night and when not active during the day. rtc 4 months or call sooner if problems.   Richard A. Felecia Shelling, MD, PhD 11/16/4825, 0:78 AM Certified in Neurology, Clinical Neurophysiology, Sleep Medicine, Pain Medicine and  Neuroimaging  [facility] [address], [place], [place] [address] [phone]

## 2014-11-24 DIAGNOSIS — E876 Hypokalemia: Secondary | ICD-10-CM | POA: Insufficient documentation

## 2014-11-24 DIAGNOSIS — F4322 Adjustment disorder with anxiety: Secondary | ICD-10-CM | POA: Insufficient documentation

## 2015-01-07 ENCOUNTER — Ambulatory Visit (HOSPITAL_COMMUNITY): Payer: Commercial Managed Care - PPO | Admitting: Clinical

## 2015-01-12 ENCOUNTER — Encounter (HOSPITAL_COMMUNITY): Payer: Self-pay | Admitting: Clinical

## 2015-01-12 ENCOUNTER — Encounter (INDEPENDENT_AMBULATORY_CARE_PROVIDER_SITE_OTHER): Payer: Self-pay

## 2015-01-12 ENCOUNTER — Ambulatory Visit (INDEPENDENT_AMBULATORY_CARE_PROVIDER_SITE_OTHER): Payer: Commercial Managed Care - PPO | Admitting: Clinical

## 2015-01-12 DIAGNOSIS — F909 Attention-deficit hyperactivity disorder, unspecified type: Secondary | ICD-10-CM | POA: Diagnosis not present

## 2015-01-12 DIAGNOSIS — F4310 Post-traumatic stress disorder, unspecified: Secondary | ICD-10-CM | POA: Diagnosis not present
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
## 2015-01-19 ENCOUNTER — Telehealth: Payer: Self-pay | Admitting: Neurology

## 2015-01-19 NOTE — Telephone Encounter (Signed)
Patient called and needs a refill on her Rx. amphetamine-dextroamphetamine (ADDERALL) 10 MG tablet. Please call and advise.

## 2015-01-20 NOTE — Telephone Encounter (Signed)
I have spoken with [facility] this morning.  She has an appt. with RAS tomorrow am--Adderall can be r/f at that appt/fim

## 2015-01-21 ENCOUNTER — Ambulatory Visit (INDEPENDENT_AMBULATORY_CARE_PROVIDER_SITE_OTHER): Payer: Commercial Managed Care - PPO | Admitting: Neurology

## 2015-01-21 ENCOUNTER — Encounter: Payer: Self-pay | Admitting: Neurology

## 2015-01-21 VITALS — BP 100/70 | HR 64 | Resp 12 | Ht 63.5 in | Wt 147.2 lb

## 2015-01-21 DIAGNOSIS — G894 Chronic pain syndrome: Secondary | ICD-10-CM | POA: Diagnosis not present

## 2015-01-21 DIAGNOSIS — F5111 Primary hypersomnia: Secondary | ICD-10-CM

## 2015-01-21 DIAGNOSIS — M5416 Radiculopathy, lumbar region: Secondary | ICD-10-CM

## 2015-01-21 DIAGNOSIS — G4700 Insomnia, unspecified: Secondary | ICD-10-CM | POA: Diagnosis not present

## 2015-01-21 DIAGNOSIS — M5010 Cervical disc disorder with radiculopathy, unspecified cervical region: Secondary | ICD-10-CM

## 2015-01-21 DIAGNOSIS — F4322 Adjustment disorder with anxiety: Secondary | ICD-10-CM

## 2015-01-21 DIAGNOSIS — M25512 Pain in left shoulder: Secondary | ICD-10-CM | POA: Diagnosis not present

## 2015-01-21 MED ORDER — AMPHETAMINE-DEXTROAMPHETAMINE 10 MG PO TABS: 10.0000 mg | ORAL_TABLET | Freq: Every day | ORAL | Status: DC

## 2015-01-21 MED ORDER — OXYCODONE-ACETAMINOPHEN 5-325 MG PO TABS: 1.0000 | ORAL_TABLET | Freq: Two times a day (BID) | ORAL | Status: DC | PRN

## 2015-01-21 NOTE — Progress Notes (Signed)
[facility]  PATIENT: Samantha [name] DOB: 1970-09-27  REFERRING DOCTOR OR PCP:  Dr. Jesse Fall SOURCE: Patient and [facility] is external give me to sleepiness and pain sleepiness ogy records  _________________________________   HISTORICAL  CHIEF COMPLAINT:  Chief Complaint  Patient presents with  . hypersomnia    Sts. she is sleeping better with Valium 10mg  qhs,  She also takes Benadryl at night for allergies and feels this helps. Sts. lbp is worse.  She is using Fentanyl patches but feels Fentanyl actually exacerbates pain.  Sts. she injured her right shoulder during yardwork--sts. she saw her pcp and x-rays were negative, but pain is not resolving.   Sts. neck pain is the same.  Sts. she only uses right wrist splint for cts at night if she is hurting--o/w does not wear splint/fim  . Insomnia    Sts. she has stopped taking Dilaudid because it does not help pain/fim  . Back Pain  . Neck Pain  . Carpal Tunnel    HISTORY OF PRESENT ILLNESS:  Samantha [name] is a 44 year old woman with excessive daytime sleepiness and lower back and leg pain.   Daytime sleepiness/insomnia: She reports that she still has EDS but does not need to take naps.   She takes Adderall 20 mg in am and 10 mg at lunch.    She is getting 5-6 hours sleep most nights, sometimes longer.   That helped her excessive daytime sleepiness but she continued to have difficulty falling asleep at night.    There is some trouble falling asleep at night.   She also notes attention deficit as well and some difficulties focusing and coming up with the right words.      She is sleeping better since getting a dog, falling asleep quickly most nights but still waking up after 5-6 hours and then having trouble falling back asleep.  She notes anxiety at night.     EPWORTH SLEEPINESS SCALE  On a scale of 0 - 3 what is the chance of dozing:  Sitting and Reading:   3 Watching  TV:    2 Sitting inactive in a public place: 1 Passenger in car for one hour: 2 Lying down to rest in the afternoon: 2 Sitting and talking to someone: 0 Sitting quietly after lunch:  3 In a car, stopped in traffic:  1  Total (out of 24):   14/24   Sleep studies:   PSG 06/27/2013 showed no significant OSA (AHI = 1).  She had moderately severe snoring. There is no significant periodic limb movements of sleep. Sleep efficiency was 68% and she had a delay sleep latency. She had normal amount of slow wave N3 sleep but no REM sleep.   MSLT 11/29/2013 was abnormal with a slightly reduced mean sleep latency of 9.6 minutes.   There was no sleep onset REM sleep. 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  Low back and leg pain: She gets pain in the back, legs and shoulders.   LB and leg / knee pain is worse with walking.   Rest helps.     MRI report was reviewed from 08/09/2011. It shows L4-L5 and L5-S1 degenerative disc disease but no definite nerve root compression.  Neck pain/cervical radiculopathy:  She reports neck pain and pain that goes down the right arm to the fingers.     Touching her shoulder (ie seatbelt) and external elevation of arm increases shoulder pain.   She was  told xray of shoulder was fine.  She has had a C5-C6 fusion in the past. MRI report from 12/19/2013 was reviewed.  It showed C5-C6 fusion and also showed C6-C7 > C4-C5 disc protrusion.  Chronic pain management: She tried fentanyl 25 g patches and hydromorphone 4 mg 3 times a day. She sometimes will cut fentanyl in 1/2 or 1/3 since the fentanyl makes her feel bad.   She stopped Dilaudid as she got 'bitchy'.  She did best on Percocet in the past.  She is also on diazepam 5 mg 3 times a day. In the past, she has had lumbar and cervical ESI's with short term benefit.  Hand numbness:   The NCV was reportedly negative but the injections and wearing a brace helped.  REVIEW OF SYSTEMS: Constitutional: No fevers, chills,  sweats, or change in appetite.   Sleepiness Eyes: No visual changes, double vision, eye pain.   Eyes itch at times Ear, nose and throat: No hearing loss, ear pain, nasal congestion, sore throat Cardiovascular: No chest pain, palpitations Respiratory: No shortness of breath at rest or with exertion.   No wheezes GastrointestinaI: No nausea, vomiting, diarrhea, abdominal pain, fecal incontinence Genitourinary: No dysuria, urinary retention or frequency.  No nocturia. Musculoskeletal: as above Integumentary: No rash, pruritus, skin lesions Neurological: as above Psychiatric: No depression at this time.  No anxiety Endocrine: No palpitations, diaphoresis, change in appetite, change in weigh.  Reports increased thirst Hematologic/Lymphatic: No anemia, purpura, petechiae. Allergic/Immunologic: No itchy/runny eyes, nasal congestion, recent allergic reactions, rashes  ALLERGIES: Allergies  Allergen Reactions  . Bactrim Anaphylaxis  . Cymbalta [Duloxetine Hcl] Shortness Of Breath  . Adhesive [Tape]   . Doxycycline     Possibly joint pain or itch   . Erythromycin     Upset stomach  . Hydrocodone-Homatropine     Not true allergy. "irritated"  . Levofloxacin     Weakness, myalgia  . Lorazepam Other (See Comments)    Hallucination  . Meloxicam Itching  . Naproxen Sodium Itching  . Omeprazole-Sodium Bicarbonate     Chest pain-sore all over-HA  . Ranitidine     Severe joint pain  . Tetracyclines & Related   . Neosporin [Neomycin-Bacitracin Zn-Polymyx] Rash    HOME MEDICATIONS:  Current outpatient prescriptions:  .  albuterol (PROVENTIL HFA;VENTOLIN HFA) 108 (90 BASE) MCG/ACT inhaler, Inhale 2 puffs into the lungs every 6 (six) hours as needed., Disp: , Rfl:  .  amphetamine-dextroamphetamine (ADDERALL) 10 MG tablet, Take 1 tablet (10 mg total) by mouth daily with breakfast., Disp: 90 tablet, Rfl: 0 .  atenolol-chlorthalidone (TENORETIC) 50-25 MG per tablet, Take 1 tablet by mouth  daily., Disp: , Rfl:  .  azelastine (ASTELIN) 137 MCG/SPRAY nasal spray, Place 1 spray into the nose every evening. Use in each nostril as directed, Disp: , Rfl:  .  buPROPion (WELLBUTRIN SR) 100 MG 12 hr tablet, Take 100 mg by mouth 2 (two) times daily., Disp: , Rfl:  .  butalbital-acetaminophen-caffeine (FIORICET WITH CODEINE) 50-325-40-30 MG per capsule, Take 1 capsule by mouth every 4 (four) hours as needed., Disp: , Rfl:  .  cholecalciferol (VITAMIN D) 1000 UNITS tablet, Take by mouth Nightly., Disp: , Rfl:  .  diazepam (VALIUM) 10 MG tablet, 1/2 pill as needed daily for anxiety prn 1 pill at bedtime, Disp: 45 tablet, Rfl: 5 .  fentaNYL (DURAGESIC - DOSED MCG/HR) 25 MCG/HR patch, Place 1 patch (25 mcg total) onto the skin every 3 (three) days., Disp: 10 patch,  Rfl: 0 .  fluticasone (FLONASE) 50 MCG/ACT nasal spray, Place 2 sprays into the nose daily., Disp: , Rfl:  .  fluticasone (FLOVENT HFA) 110 MCG/ACT inhaler, Inhale 1 puff into the lungs 2 (two) times daily., Disp: , Rfl:  .  mupirocin ointment (BACTROBAN) 2 %, Apply topically 2 (two) times daily., Disp: 30 g, Rfl: 0 .  potassium chloride (K-DUR) 10 MEQ tablet, Take 2 tablets daily, Disp: , Rfl:  .  sertraline (ZOLOFT) 100 MG tablet, Take 150 mg by mouth Nightly. , Disp: , Rfl:  .  cyclobenzaprine (FLEXERIL) 10 MG tablet, Take 10 mg by mouth 3 (three) times daily as needed., Disp: , Rfl:  .  HYDROmorphone (DILAUDID) 4 MG tablet, Take 1 tablet (4 mg total) by mouth 3 (three) times daily as needed. (Patient not taking: Reported on 01/21/2015), Disp: 90 tablet, Rfl: 0  PAST MEDICAL HISTORY: Past Medical History  Diagnosis Date  . IBS (irritable bowel syndrome)   . GERD (gastroesophageal reflux disease)   . Hernia   . Fibromyalgia   . Raynaud disease   . Arthritis   . Chronic neck pain   . Endometriosis   . Vertigo   . ADD (attention deficit disorder)   . OCD (obsessive compulsive disorder)   . Hypertension   . Chronic back pain    . Multiple allergies   . IC (interstitial cystitis)   . Asthma     PAST SURGICAL HISTORY: Past Surgical History  Procedure Laterality Date  . Bladder surgery    . Cervical fusion    . Rotator cuff repair    . Cesarean section    . Hand surgery    . Lesion removal  08/23/2012    Procedure: LESION REMOVAL HAND;  Surgeon: Cammie Sickle., MD;  Location: [facility];  Service: Orthopedics;  Laterality: Left;    FAMILY HISTORY: Family History  Problem Relation Age of Onset  . ADD / ADHD Brother     SOCIAL HISTORY:  History   Social History  . Marital Status: Married    Spouse Name: N/A  . Number of Children: N/A  . Years of Education: N/A   Occupational History  . Not on file.   Social History Main Topics  . Smoking status: Never Smoker   . Smokeless tobacco: Never Used  . Alcohol Use: No  . Drug Use: No  . Sexual Activity: Yes    Birth Control/ Protection: IUD   Other Topics Concern  . Not on file   Social History Narrative     PHYSICAL EXAM  Filed Vitals:   01/21/15 0914  BP: 100/70  Pulse: 64  Resp: 12  Height: 5' 3.5" (1.613 m)  Weight: 147 lb 3.2 oz (66.769 kg)    Body mass index is 25.66 kg/(m^2).   General: The patient is well-developed and well-nourished and in no acute distress.  Musculoskeletal:  Back is tender.   Mildly tender over lower cerv paraspinals and many FMS tender points.   Moderately severe tender over left subacromial bursa.  Neurologic Exam  Mental status: The patient is alert and oriented x 3 at the time of the examination. The patient has apparent normal recent and remote memory, with an apparently normal attention span and concentration ability.   Speech is normal.  Cranial nerves: Extraocular movements are full.  Facial symmetry is present.   .Facial strength is normal.  Trapezius and sternocleidomastoid strength is normal. No dysarthria is noted.  The  tongue is midline, and the patient has symmetric  elevation of the soft palate.   Motor:  Muscle bulk is normal.   Tone is normal. Strength is  5 / 5 in all 4 extremities except 4+/5 right APB.   Sensory: Sensory testing shows reduced right thenar eminence sensation and intact to pinprick, soft touch and vibration sensation elsewhere.  Coordination: Cerebellar testing reveals good finger-nose-finger.  Gait and station: Station is normal.   Gait is normal. Tandem gait is normal.    Reflexes: Deep tendon reflexes are symmetric and normal bilaterally.       DIAGNOSTIC DATA (LABS, IMAGING, TESTING) - I reviewed patient records, labs, notes, testing and imaging myself where available.      ASSESSMENT AND PLAN  Primary hypersomnia  Chronic pain syndrome  Insomnia  Lumbar radicular pain  Cervical disc disorder with radiculopathy  Left shoulder pain  Adjustment disorder with anxiety    1.  Refill Adderall for hypersomnia 2.  changel fentanyl/dilaudid to percocet for pain 3.  Inject subacromial bursa with 40 mg depot Medrol and Marcaine using sterile technique. A few minutes later she noted reduced shoulder pain.  rtc 4 months or call sooner if problems.   Richard A. Felecia Shelling, MD, PhD 10/16/5595, 4:16 AM Certified in Neurology, Clinical Neurophysiology, Sleep Medicine, Pain Medicine and Neuroimaging  [facility] [address], [place], [place] [address] [phone]

## 2015-02-03 ENCOUNTER — Ambulatory Visit (INDEPENDENT_AMBULATORY_CARE_PROVIDER_SITE_OTHER): Payer: Commercial Managed Care - PPO | Admitting: Clinical

## 2015-02-03 ENCOUNTER — Encounter (HOSPITAL_COMMUNITY): Payer: Self-pay | Admitting: Clinical

## 2015-02-03 DIAGNOSIS — F909 Attention-deficit hyperactivity disorder, unspecified type: Secondary | ICD-10-CM

## 2015-02-03 DIAGNOSIS — F4310 Post-traumatic stress disorder, unspecified: Secondary | ICD-10-CM | POA: Diagnosis not present

## 2015-02-03 NOTE — Psych (Signed)
   THERAPIST PROGRESS NOTE  Session Time: 8:12 -9:07  Participation Level: Active  Behavioral Response: CasualAlertAnxious  Type of Therapy: Individual Therapy  Treatment Goals addressed: improve psychiatric symptoms, emotional regulation skills, discuss and process trauma  Interventions: CBT and Motivational Interviewing, Grounding and Mindfulness Techniques  Summary: Samantha [name] is a 44 y.o. female who presents with PTSD and ADHD.  Suicidal/Homicidal: No -without intent/plan  Therapist Response:  Samantha [name] met with clinician for an individual session. Samantha [name] discussed her psychiatric symptoms and  her current life events. Samantha [name] shared that her son turned 6 and that in preparing for his party she had flashbacks. Client and clinician discussed briefly what the flashbacks were. Samantha [name] shared about her thoughts and emotions about the flash backs. Clinician introduced grounding and mindfulness techniques. Client and clinician discussed the process and the purpose. Samantha [name] asked clarifying questions which clinician answered. 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 Samantha [name] agreed to practice two techniques daily until next session.   Plan: Return again in 1 weeks.  Diagnosis: Axis I: PTSD and ADHD    Yarden Hillis A, LCSW 02/03/2015

## 2015-02-11 ENCOUNTER — Ambulatory Visit (HOSPITAL_COMMUNITY): Payer: Self-pay | Admitting: Clinical

## 2015-02-18 ENCOUNTER — Encounter (HOSPITAL_COMMUNITY): Payer: Self-pay | Admitting: Clinical

## 2015-02-18 ENCOUNTER — Ambulatory Visit (INDEPENDENT_AMBULATORY_CARE_PROVIDER_SITE_OTHER): Payer: Commercial Managed Care - PPO | Admitting: Clinical

## 2015-02-18 DIAGNOSIS — F909 Attention-deficit hyperactivity disorder, unspecified type: Secondary | ICD-10-CM

## 2015-02-18 DIAGNOSIS — F4310 Post-traumatic stress disorder, unspecified: Secondary | ICD-10-CM
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
## 2015-03-02 ENCOUNTER — Ambulatory Visit (HOSPITAL_COMMUNITY): Payer: Self-pay | Admitting: Clinical

## 2015-03-19 ENCOUNTER — Ambulatory Visit (INDEPENDENT_AMBULATORY_CARE_PROVIDER_SITE_OTHER): Payer: Commercial Managed Care - PPO | Admitting: Clinical

## 2015-03-19 ENCOUNTER — Encounter (HOSPITAL_COMMUNITY): Payer: Self-pay | Admitting: Clinical

## 2015-03-19 DIAGNOSIS — F909 Attention-deficit hyperactivity disorder, unspecified type: Secondary | ICD-10-CM

## 2015-03-19 DIAGNOSIS — F4310 Post-traumatic stress disorder, unspecified: Secondary | ICD-10-CM | POA: Diagnosis not present

## 2015-03-19 NOTE — Progress Notes (Signed)
   THERAPIST PROGRESS NOTE  Session Time: 9:07 -10:03  Participation Level: Active  Behavioral Response: CasualAlertDepressed  Type of Therapy: Individual Therapy  Treatment Goals addressed: improve psychiatric symptoms, Improve unhelpful thinking patterns, emotional regulation skills,   Interventions: CBT and Motivational Interviewing,   Summary: Samantha [name] is a 44 y.o. female who presents with PTSD and ADHD.  Suicidal/Homicidal: No -without intent/plan  Therapist Response:  Samantha [name] met with clinician for an individual session. Samantha [name] discussed her psychiatric symptoms, her current life events, and her homework. Samantha [name] shared that she had been practicing her grounding and mindfulness techniques. She shared that she had been using doodling as a method of grounding also. Samantha [name] shared that she found the exercises helpful. Samantha [name] also discussed her relationship with her husband. She shared that she would like to be separated from him but that financial circumstances make that very difficult right now. Samantha [name] and clinician discussed what was in her power to change and what was not. Client and clinician discussed some basic cbt concepts. Client and clinician discussed how our thoughts affect our emotions and actions. Clinician introduced the idea of keeping a gratitude journal. Clinician explained the process and purpose. Samantha [name] stated that she thought she would enjoy the process and agreed to keep one daily until the next session.  Plan: Return again in 1 weeks.  Diagnosis: Axis I: PTSD and ADHD    Gwenette Wellons A, LCSW 03/19/2015

## 2015-03-23 ENCOUNTER — Ambulatory Visit: Payer: Commercial Managed Care - PPO | Admitting: Neurology

## 2015-03-26 ENCOUNTER — Encounter (HOSPITAL_COMMUNITY): Payer: Self-pay | Admitting: Clinical

## 2015-03-26 ENCOUNTER — Ambulatory Visit (INDEPENDENT_AMBULATORY_CARE_PROVIDER_SITE_OTHER): Payer: Commercial Managed Care - PPO | Admitting: Clinical

## 2015-03-26 DIAGNOSIS — F4310 Post-traumatic stress disorder, unspecified: Secondary | ICD-10-CM | POA: Diagnosis not present

## 2015-03-26 DIAGNOSIS — F909 Attention-deficit hyperactivity disorder, unspecified type: Secondary | ICD-10-CM

## 2015-03-26 NOTE — Progress Notes (Signed)
   THERAPIST PROGRESS NOTE  Session Time: 8:00 -  Participation Level: Active  Behavioral Response: CasualAlertNA  Type of Therapy: Individual Therapy  Treatment Goals addressed: improve psychiatric symptoms, Improve unhelpful thinking patterns, emotional regulation skills, discuss and process trauma  Interventions: CBT and Motivational Interviewing, Grounding and Mindfulness Techniques  Summary: Serai Tukes is a 44 y.o. female who presents with PTSD and ADHD.  Suicidal/Homicidal: No -without intent/plan  Therapist Response:  Priscilla met with clinician for an individual session. Eniyah discussed her psychiatric symptoms, her current life events, and her homework. Tanith reported that she had a very good week. She shared that it had been her birthday and she had gotten to spend the evening with a very dear friend. She shared that they laughed a lot and that she felt good about being able to share some good advice with her. Kayann shared this was a rare birthday for her because everyone was nice to her. Her husband arranged for her to see her friend, her parents had come by and were pleasant. She smiled and giggled as she spoke about her day. Donetta shared that she has continued to practice her modified grounding techniques. She shared that she found them very helpful. Oveta shared some entries from her gratitude journal. Client and clinician discussed how Merrisa could modify her practice so that she could get a greater therapeutic value from the exercise. Nyrie and clinician discussed the purpose of the practice. Wilmarie asked clarifying questions and stated that she understood and would continue the practice along with her grounding and mindfulness practice.    Plan: Return again in 1 weeks.  Diagnosis: Axis I: PTSD and ADHD    Evalena Fujii A, LCSW 03/26/2015

## 2015-04-09 ENCOUNTER — Ambulatory Visit (HOSPITAL_COMMUNITY): Payer: Self-pay | Admitting: Psychology

## 2015-04-29 ENCOUNTER — Ambulatory Visit (HOSPITAL_COMMUNITY): Payer: Self-pay | Admitting: Psychology

## 2015-05-04 ENCOUNTER — Ambulatory Visit (HOSPITAL_COMMUNITY): Payer: Self-pay | Admitting: Clinical

## 2015-05-13 ENCOUNTER — Ambulatory Visit (INDEPENDENT_AMBULATORY_CARE_PROVIDER_SITE_OTHER): Payer: Commercial Managed Care - PPO | Admitting: Clinical

## 2015-05-13 ENCOUNTER — Ambulatory Visit (HOSPITAL_COMMUNITY): Payer: Self-pay | Admitting: Psychology

## 2015-05-13 ENCOUNTER — Encounter (HOSPITAL_COMMUNITY): Payer: Self-pay | Admitting: Clinical

## 2015-05-13 DIAGNOSIS — F4310 Post-traumatic stress disorder, unspecified: Secondary | ICD-10-CM

## 2015-05-13 DIAGNOSIS — F909 Attention-deficit hyperactivity disorder, unspecified type: Secondary | ICD-10-CM
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
## 2015-05-26 ENCOUNTER — Encounter: Payer: Self-pay | Admitting: Neurology

## 2015-05-26 ENCOUNTER — Ambulatory Visit (INDEPENDENT_AMBULATORY_CARE_PROVIDER_SITE_OTHER): Payer: Commercial Managed Care - PPO | Admitting: Neurology

## 2015-05-26 VITALS — BP 108/70 | HR 66 | Resp 14 | Ht 63.5 in | Wt 149.0 lb

## 2015-05-26 DIAGNOSIS — M791 Myalgia, unspecified site: Secondary | ICD-10-CM

## 2015-05-26 DIAGNOSIS — F4322 Adjustment disorder with anxiety: Secondary | ICD-10-CM | POA: Diagnosis not present

## 2015-05-26 DIAGNOSIS — G4700 Insomnia, unspecified: Secondary | ICD-10-CM | POA: Diagnosis not present

## 2015-05-26 DIAGNOSIS — M25512 Pain in left shoulder: Secondary | ICD-10-CM

## 2015-05-26 DIAGNOSIS — M5481 Occipital neuralgia: Secondary | ICD-10-CM | POA: Diagnosis not present

## 2015-05-26 DIAGNOSIS — G894 Chronic pain syndrome: Secondary | ICD-10-CM

## 2015-05-26 DIAGNOSIS — F5111 Primary hypersomnia: Secondary | ICD-10-CM | POA: Diagnosis not present

## 2015-05-26 MED ORDER — FENTANYL 25 MCG/HR TD PT72: 25.0000 ug | MEDICATED_PATCH | TRANSDERMAL | Status: DC
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
## 2015-05-28 ENCOUNTER — Ambulatory Visit (INDEPENDENT_AMBULATORY_CARE_PROVIDER_SITE_OTHER): Payer: Commercial Managed Care - PPO | Admitting: Clinical

## 2015-05-28 DIAGNOSIS — F909 Attention-deficit hyperactivity disorder, unspecified type: Secondary | ICD-10-CM

## 2015-05-28 DIAGNOSIS — F4310 Post-traumatic stress disorder, unspecified: Secondary | ICD-10-CM

## 2015-06-03 ENCOUNTER — Encounter (HOSPITAL_COMMUNITY): Payer: Self-pay | Admitting: Clinical

## 2015-06-03 NOTE — Progress Notes (Signed)
   THERAPIST PROGRESS NOTE  Session Time: 9:02 - 9:58  Participation Level: Active  Behavioral Response: CasualAlertAnxious  Type of Therapy: Individual Therapy  Treatment Goals addressed: improve psychiatric symptoms, Improve unhelpful thinking patterns, emotional regulation skills,   Interventions: CBT and Motivational Interviewing, Grounding and Mindfulness Techniques  Summary: Samantha [name] is a 44 y.o. female who presents with PTSD and ADHD.  Suicidal/Homicidal: No -without intent/plan  Therapist Response:  Samantha [name] met with clinician for an individual session. Samantha [name] discussed her psychiatric symptoms, her current life events, and her homework.  She shared that she had completed her homework. Client and clinician reviewed her homework. Samantha [name] shared how the topics related to her life. Samantha [name] shared about practicing her techniques to lessen the effects of her PTSD symptoms. Samantha [name] identified areas for improvement. She shared some of the things she might be able to practice to improve her emotional regulation.  Samantha [name] shared that she and he husband were not getting a long. She shared that their interactions make her anxious and depressed. Client and clinician discussed her response to his behaviors. Samantha [name] shared that when she remembers she uses her grounding and mindfulness techniques. She shared about practicing her martial arts as a mindfulness technique. Samantha [name] shared that she can not leave her husband because she is unable to work because of all her health issues. She shred that she feels like they would both be better off if they were separate but she plans to stay.  Samantha [name] explored ways that she could increase her independence. Samantha [name] agreed to continue her homework until next session.       Plan: Return again in 1 weeks.  Diagnosis: Axis I: PTSD and ADHD  Powell,Frances A, LCSW 06/03/2015

## 2015-06-16 ENCOUNTER — Ambulatory Visit (HOSPITAL_COMMUNITY): Payer: Self-pay | Admitting: Clinical

## 2015-06-30 ENCOUNTER — Ambulatory Visit (HOSPITAL_COMMUNITY): Payer: Self-pay | Admitting: Clinical

## 2015-07-14 ENCOUNTER — Ambulatory Visit (HOSPITAL_COMMUNITY): Payer: Self-pay | Admitting: Clinical

## 2015-07-28 ENCOUNTER — Ambulatory Visit (HOSPITAL_COMMUNITY): Payer: Self-pay | Admitting: Clinical

## 2015-09-01 DIAGNOSIS — R1011 Right upper quadrant pain: Secondary | ICD-10-CM | POA: Insufficient documentation

## 2015-09-01 DIAGNOSIS — J0110 Acute frontal sinusitis, unspecified: Secondary | ICD-10-CM | POA: Insufficient documentation

## 2015-09-03 DIAGNOSIS — R079 Chest pain, unspecified: Secondary | ICD-10-CM | POA: Insufficient documentation

## 2015-09-03 DIAGNOSIS — I700 Atherosclerosis of aorta: Secondary | ICD-10-CM | POA: Insufficient documentation

## 2015-09-25 ENCOUNTER — Ambulatory Visit (INDEPENDENT_AMBULATORY_CARE_PROVIDER_SITE_OTHER): Payer: Managed Care, Other (non HMO) | Admitting: Neurology

## 2015-09-25 ENCOUNTER — Encounter: Payer: Self-pay | Admitting: Neurology

## 2015-09-25 VITALS — BP 112/78 | HR 66 | Resp 14 | Ht 63.5 in | Wt 148.0 lb

## 2015-09-25 DIAGNOSIS — G4700 Insomnia, unspecified: Secondary | ICD-10-CM | POA: Diagnosis not present

## 2015-09-25 DIAGNOSIS — M25512 Pain in left shoulder: Secondary | ICD-10-CM | POA: Diagnosis not present

## 2015-09-25 DIAGNOSIS — G894 Chronic pain syndrome: Secondary | ICD-10-CM

## 2015-09-25 DIAGNOSIS — M542 Cervicalgia: Secondary | ICD-10-CM | POA: Diagnosis not present

## 2015-09-25 DIAGNOSIS — F5111 Primary hypersomnia: Secondary | ICD-10-CM | POA: Diagnosis not present

## 2015-09-25 MED ORDER — MORPHINE SULFATE ER 15 MG PO TBCR: 15.0000 mg | EXTENDED_RELEASE_TABLET | Freq: Two times a day (BID) | ORAL | Status: DC

## 2015-09-25 MED ORDER — CYCLOBENZAPRINE HCL 10 MG PO TABS: 10.0000 mg | ORAL_TABLET | Freq: Three times a day (TID) | ORAL | Status: DC | PRN
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
## 2015-09-25 NOTE — Patient Instructions (Signed)
Cervical Sprain  A cervical sprain is an injury in the neck in which the strong, fibrous tissues (ligaments) that connect your neck bones stretch or tear. Cervical sprains can range from mild to severe. Severe cervical sprains can cause the neck vertebrae to be unstable. This can lead to damage of the spinal cord and can result in serious nervous system problems. The amount of time it takes for a cervical sprain to get better depends on the cause and extent of the injury. Most cervical sprains heal in 1 to 3 weeks.  CAUSES   Severe cervical sprains may be caused by:    Contact sport injuries (such as from football, rugby, wrestling, hockey, auto racing, gymnastics, diving, martial arts, or boxing).    Motor vehicle collisions.    Whiplash injuries. This is an injury from a sudden forward and backward whipping movement of the head and neck.   Falls.   Mild cervical sprains may be caused by:    Being in an awkward position, such as while cradling a telephone between your ear and shoulder.    Sitting in a chair that does not offer proper support.    Working at a poorly designed computer station.    Looking up or down for long periods of time.   SYMPTOMS    Pain, soreness, stiffness, or a burning sensation in the front, back, or sides of the neck. This discomfort may develop immediately after the injury or slowly, 24 hours or more after the injury.    Pain or tenderness directly in the middle of the back of the neck.    Shoulder or upper back pain.    Limited ability to move the neck.    Headache.    Dizziness.    Weakness, numbness, or tingling in the hands or arms.    Muscle spasms.    Difficulty swallowing or chewing.    Tenderness and swelling of the neck.   DIAGNOSIS   Most of the time your health care provider can diagnose a cervical sprain by taking your history and doing a physical exam. Your health care provider will ask about previous neck injuries and any known neck  problems, such as arthritis in the neck. X-rays may be taken to find out if there are any other problems, such as with the bones of the neck. Other tests, such as a CT scan or MRI, may also be needed.   TREATMENT   Treatment depends on the severity of the cervical sprain. Mild sprains can be treated with rest, keeping the neck in place (immobilization), and pain medicines. Severe cervical sprains are immediately immobilized. Further treatment is done to help with pain, muscle spasms, and other symptoms and may include:   Medicines, such as pain relievers, numbing medicines, or muscle relaxants.    Physical therapy. This may involve stretching exercises, strengthening exercises, and posture training. Exercises and improved posture can help stabilize the neck, strengthen muscles, and help stop symptoms from returning.   HOME CARE INSTRUCTIONS    Put ice on the injured area.     Put ice in a plastic bag.     Place a towel between your skin and the bag.     Leave the ice on for 15-20 minutes, 3-4 times a day.    If your injury was severe, you may have been given a cervical collar to wear. A cervical collar is a two-piece collar designed to keep your neck from moving while it heals.      Do not remove the collar unless instructed by your health care provider.    If you have long hair, keep it outside of the collar.    Ask your health care provider before making any adjustments to your collar. Minor adjustments may be required over time to improve comfort and reduce pressure on your chin or on the back of your head.    Ifyou are allowed to remove the collar for cleaning or bathing, follow your health care provider's instructions on how to do so safely.    Keep your collar clean by wiping it with mild soap and water and drying it completely. If the collar you have been given includes removable pads, remove them every 1-2 days and hand wash them with soap and water. Allow them to air dry. They should be completely  dry before you wear them in the collar.    If you are allowed to remove the collar for cleaning and bathing, wash and dry the skin of your neck. Check your skin for irritation or sores. If you see any, tell your health care provider.    Do not drive while wearing the collar.    Only take over-the-counter or prescription medicines for pain, discomfort, or fever as directed by your health care provider.    Keep all follow-up appointments as directed by your health care provider.    Keep all physical therapy appointments as directed by your health care provider.    Make any needed adjustments to your workstation to promote good posture.    Avoid positions and activities that make your symptoms worse.    Warm up and stretch before being active to help prevent problems.   SEEK MEDICAL CARE IF:    Your pain is not controlled with medicine.    You are unable to decrease your pain medicine over time as planned.    Your activity level is not improving as expected.   SEEK IMMEDIATE MEDICAL CARE IF:    You develop any bleeding.   You develop stomach upset.   You have signs of an allergic reaction to your medicine.    Your symptoms get worse.    You develop new, unexplained symptoms.    You have numbness, tingling, weakness, or paralysis in any part of your body.   MAKE SURE YOU:    Understand these instructions.   Will watch your condition.   Will get help right away if you are not doing well or get worse.     This information is not intended to replace advice given to you by your health care provider. Make sure you discuss any questions you have with your health care provider.     Document Released: 06/26/2007 Document Revised: 09/03/2013 Document Reviewed: 03/06/2013  [employer]vier Interactive Patient Education 2016 [employer]

## 2015-09-29 ENCOUNTER — Ambulatory Visit (INDEPENDENT_AMBULATORY_CARE_PROVIDER_SITE_OTHER): Payer: Managed Care, Other (non HMO) | Admitting: Allergy and Immunology

## 2015-09-29 ENCOUNTER — Encounter: Payer: Self-pay | Admitting: Allergy and Immunology

## 2015-09-29 VITALS — BP 102/68 | HR 64 | Resp 16

## 2015-09-29 DIAGNOSIS — H1010 Acute atopic conjunctivitis, unspecified eye: Secondary | ICD-10-CM

## 2015-09-29 DIAGNOSIS — M1990 Unspecified osteoarthritis, unspecified site: Secondary | ICD-10-CM

## 2015-09-29 DIAGNOSIS — K219 Gastro-esophageal reflux disease without esophagitis: Secondary | ICD-10-CM

## 2015-09-29 DIAGNOSIS — T7840XD Allergy, unspecified, subsequent encounter: Secondary | ICD-10-CM | POA: Diagnosis not present

## 2015-09-29 DIAGNOSIS — J309 Allergic rhinitis, unspecified: Secondary | ICD-10-CM

## 2015-09-29 DIAGNOSIS — J387 Other diseases of larynx: Secondary | ICD-10-CM | POA: Diagnosis not present

## 2015-09-29 DIAGNOSIS — J4520 Mild intermittent asthma, uncomplicated: Secondary | ICD-10-CM

## 2015-09-29 NOTE — Patient Instructions (Addendum)
  1. Continue action plan for asthma flare:   A. Flovent 222 inhalations twice a day with spacer  B. ProAir HFA 2 puffs every 4-6 hours if needed  2. Continue EpiPen, Benadryl, M.D./ER for allergic reaction  3. Continue treatment of allergic rhinitis:   A. fluticasone nasal spray - 1-2 sprays each nostril one time per day  B. Azelastine nasal spray - 1-2 sprays each nostril two time per day  4. Can continue over-the-counter antihistamine if needed  5. Blood tests - alpha gal panel, ANA with reflex, RF, CCP, sed, CRP  6. Further evaluation?  7. Return to clinic in 1 year or earlier if problem

## 2015-09-29 NOTE — Progress Notes (Signed)
[facility]  Follow-up Note  Referring Provider: Maylon Peppers, MD Primary Provider: Maylon Peppers, MD Date of Office Visit: 09/29/2015  Subjective:   Samantha [name] is a 45 y.o. female who returns to the [facility] in re-evaluation of the following:  HPI Comments: Samantha [name] returns to this clinic on 09/29/2015 in reevaluation of her asthma, allergic rhinoconjunctivitis, and laryngopharyngeal reflux, and recurrent allergic reactions. It is been over a year and a half since I've last seen her in his clinic in overall she is done very well. She has an action plan to initiate whenever she develops an asthma flare. She's had to activate that plan during the spring and fall of the year for about one or 2 weeks. Her action plan includes high-dose Flovent and as needed use of a bronchodilator. Her requirement for bronchodilator averages out to about twice a month. She can exercise for the most part without any problem. She's not been having much problems with her nose while intermittently using nasal fluticasone and nasal saline and occasionally some antihistamines spray. She did have to allergic reactions with unknown trigger giving rise to diffuse flushing for which she used her EpiPen on 2 occasions since last seen in this clinic. Her reflux is okay and she does not require any type of treatment at this point in time and managed and she's not had any throat issues. She apparently had an upper endoscopy performed in October 2016 which was "great".  Samantha [name] is apparently also been having problems with her joints. She complains about having hand joint pain and swelling on a pretty common basis. 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 Sometimes her ankles and toes will swell up as well. She is questioning whether or not she may have an autoimmune disease giving rise to  these problems.   Current Outpatient Prescriptions on File Prior to Visit  Medication Sig Dispense Refill  . albuterol (PROVENTIL HFA;VENTOLIN HFA) 108 (90 BASE) MCG/ACT inhaler Inhale 2 puffs into the lungs every 6 (six) hours as needed.    Marland Kitchen amphetamine-dextroamphetamine (ADDERALL) 10 MG tablet Take 2 in am, 1 in pm daily.    Marland Kitchen aspirin EC 81 MG tablet Take 81 mg by mouth.    Marland Kitchen atenolol-chlorthalidone (TENORETIC) 50-25 MG per tablet Take 1 tablet by mouth daily.    Marland Kitchen azelastine (ASTELIN) 137 MCG/SPRAY nasal spray Place 1 spray into the nose every evening. Use in each nostril as directed    . butalbital-acetaminophen-caffeine (FIORICET WITH CODEINE) 50-325-40-30 MG per capsule Take 1 capsule by mouth every 4 (four) hours as needed.    . cholecalciferol (VITAMIN D) 1000 UNITS tablet Take by mouth Nightly.    . cyclobenzaprine (FLEXERIL) 10 MG tablet Take 1 tablet (10 mg total) by mouth 3 (three) times daily as needed. Reported on 09/25/2015 90 tablet 3  . diazepam (VALIUM) 10 MG tablet 1/2 pill as needed daily for anxiety prn 1 pill at bedtime 45 tablet 5  . fluticasone (FLONASE) 50 MCG/ACT nasal spray Place 2 sprays into the nose daily.    . fluticasone (FLOVENT HFA) 110 MCG/ACT inhaler Inhale 1 puff into the lungs 2 (two) times daily.    Marland Kitchen morphine (MS CONTIN) 15 MG 12 hr tablet Take 1 tablet (15 mg total) by mouth every 12 (twelve) hours. 60 tablet 0  . mupirocin ointment (BACTROBAN) 2 % Apply topically 2 (two) times  daily. 30 g 0  . oxyCODONE-acetaminophen (ROXICET) 5-325 MG per tablet Take 1 tablet by mouth 2 (two) times daily as needed for severe pain. 60 tablet 0  . potassium chloride (K-DUR) 10 MEQ tablet Reported on 09/25/2015    . pravastatin (PRAVACHOL) 20 MG tablet Take 20 mg by mouth.    . sertraline (ZOLOFT) 100 MG tablet Take 150 mg by mouth Nightly.      No current facility-administered medications on file prior to visit.    No orders of the defined types were placed in this  encounter.    Past Medical History  Diagnosis Date  . IBS (irritable bowel syndrome)   . GERD (gastroesophageal reflux disease)   . Hernia   . Fibromyalgia   . Raynaud disease   . Arthritis   . Chronic neck pain   . Endometriosis   . Vertigo   . ADD (attention deficit disorder)   . OCD (obsessive compulsive disorder)   . Hypertension   . Chronic back pain   . Multiple allergies   . IC (interstitial cystitis)   . Asthma     Past Surgical History  Procedure Laterality Date  . Bladder surgery    . Cervical fusion    . Rotator cuff repair    . Cesarean section    . Hand surgery    . Lesion removal  08/23/2012    Procedure: LESION REMOVAL HAND;  Surgeon: Cammie Sickle., MD;  Location: [facility];  Service: Orthopedics;  Laterality: Left;    Allergies  Allergen Reactions  . Bactrim Anaphylaxis  . Cymbalta [Duloxetine Hcl] Shortness Of Breath  . Adhesive [Tape]   . Ciprofloxacin   . Doxycycline     Possibly joint pain or itch   . Erythromycin     Upset stomach  . Hydrocodone-Homatropine     Not true allergy. "irritated"  . Levofloxacin     Weakness, myalgia  . Lorazepam Other (See Comments)    Hallucination  . Meloxicam Itching  . Naproxen Sodium Itching  . Omeprazole-Sodium Bicarbonate     Chest pain-sore all over-HA  . Ranitidine     Severe joint pain  . Tetracyclines & Related   . Triamcinolone   . Neosporin [Neomycin-Bacitracin Zn-Polymyx] Rash    Review of systems negative except as noted in HPI / PMHx or noted below:  Review of Systems  Constitutional: Negative.   HENT: Negative.   Eyes: Negative.   Respiratory: Negative.   Cardiovascular: Negative.   Gastrointestinal: Negative.   Genitourinary: Negative.   Musculoskeletal: Negative.   Skin: Negative.   Neurological: Negative.   Endo/Heme/Allergies: Negative.   Psychiatric/Behavioral: Negative.      Objective:   Filed Vitals:   09/29/15 0828  BP: 102/68  Pulse: 64   Resp: 16          Physical Exam  Constitutional: She is well-developed, well-nourished, and in no distress. No distress.  HENT:  Head: Normocephalic.  Right Ear: Tympanic membrane, external ear and ear canal normal.  Left Ear: Tympanic membrane, external ear and ear canal normal.  Nose: Nose normal. No mucosal edema or rhinorrhea.  Mouth/Throat: Uvula is midline, oropharynx is clear and moist and mucous membranes are normal. No oropharyngeal exudate.  Eyes: Conjunctivae are normal.  Neck: Trachea normal. No tracheal tenderness present. No tracheal deviation present. No thyromegaly present.  Cardiovascular: Normal rate, regular rhythm, S1 normal, S2 normal and normal heart sounds.   No murmur heard. Pulmonary/Chest:  Breath sounds normal. No stridor. No respiratory distress. She has no wheezes. She has no rales.  Musculoskeletal: She exhibits no edema or tenderness.  Lymphadenopathy:       Head (right side): No tonsillar adenopathy present.       Head (left side): No tonsillar adenopathy present.    She has no cervical adenopathy.    She has no axillary adenopathy.  Neurological: She is alert. Gait normal.  Skin: No rash noted. She is not diaphoretic. No erythema. Nails show no clubbing.  Psychiatric: Mood and affect normal.    Diagnostics:    Spirometry was performed and demonstrated an FEV1 of 2.33 at 124 % of predicted.  The patient had an Asthma Control Test with the following results: ACT Total Score: 20.    Assessment and Plan:   1. Asthma, mild intermittent, well-controlled   2. Allergic rhinoconjunctivitis   3. LPRD (laryngopharyngeal reflux disease)   4. Allergic reaction, subsequent encounter   5. Arthritis     1. Continue action plan for asthma flare:   A. Flovent 220 2 inhalations twice a day with spacer  B. ProAir HFA 2 puffs every 4-6 hours if needed  2. Continue EpiPen, Benadryl, M.D./ER for allergic reaction  3. Continue treatment of allergic  rhinitis:   A. fluticasone nasal spray - 1-2 sprays each nostril one time per day  B. Azelastine nasal spray - 1-2 sprays each nostril two time per day  4. Can continue over-the-counter antihistamine if needed  5. Blood tests - alpha gal panel, ANA with reflex, RF, CCP, sed, CRP  6. Further evaluation?  7. Return to clinic in 1 year or earlier if problem  Rachelle's respiratory tract issue appears to stable while using her current medical therapy and I see no need for changing her medical therapy at this point in time. She will contact me should she have significant problems in the future while utilizing this plan. She does appear to have some problem  With arthritis and I will have her obtain the blood tests noted above in investigation of this problem.  Allena Katz, MD [place]

## 2015-10-06 LAB — ALPHA-GAL PANEL
Alpha Gal IgE*: <0.1 kU/L (ref <0.35)
BEEF CLASS INTERPRETATION: 0
Class Interpretation: 0
Class Interpretation: 0
Lamb/Mutton (Ovis spp) IgE: <0.1 kU/L (ref <0.35)

## 2015-10-06 LAB — RHEUMATOID FACTOR

## 2015-10-06 LAB — C-REACTIVE PROTEIN: CRP: 0.3 mg/L (ref 0.0–4.9)

## 2015-10-06 LAB — CYCLIC CITRUL PEPTIDE ANTIBODY, IGG/IGA: CYCLIC CITRULLIN PEPTIDE AB: 7 U (ref 0–19)

## 2015-10-06 LAB — SEDIMENTATION RATE: SED RATE: 2 mm/h (ref 0–32)

## 2015-10-06 LAB — ANA W/REFLEX IF POSITIVE: ANA: NEGATIVE

## 2015-10-20 ENCOUNTER — Other Ambulatory Visit: Payer: Self-pay | Admitting: *Deleted

## 2015-10-20 ENCOUNTER — Other Ambulatory Visit: Payer: Self-pay

## 2015-10-20 MED ORDER — FLUTICASONE PROPIONATE HFA 110 MCG/ACT IN AERO: 1.0000 | INHALATION_SPRAY | Freq: Two times a day (BID) | RESPIRATORY_TRACT | Status: DC

## 2015-10-20 MED ORDER — ALBUTEROL SULFATE HFA 108 (90 BASE) MCG/ACT IN AERS: 2.0000 | INHALATION_SPRAY | Freq: Four times a day (QID) | RESPIRATORY_TRACT | Status: DC | PRN

## 2015-10-20 MED ORDER — BECLOMETHASONE DIPROPIONATE 80 MCG/ACT IN AERS: 2.0000 | INHALATION_SPRAY | Freq: Two times a day (BID) | RESPIRATORY_TRACT | Status: DC

## 2015-10-20 MED ORDER — AZELASTINE HCL 0.1 % NA SOLN: 1.0000 | Freq: Every evening | NASAL | Status: DC

## 2015-10-20 NOTE — Telephone Encounter (Signed)
Patient was seen on 09/29/2015, she went to pick her meds and none were sent in.  Flovent 220 2 inhalations twice a day with spacer ProAir HFA 2 puffs every 4-6 hours if needed  EpiPen fluticasone nasal spray - 1-2 sprays each nostril one time per day Azelastine nasal spray - 1-2 sprays each nostril two time per day  [place] [facility] [address]. Patient is wanting a 90 day supply.  Thanks

## 2015-10-20 NOTE — Telephone Encounter (Signed)
Sent in refils

## 2015-10-20 NOTE — Telephone Encounter (Signed)
Ovar 80 two inhalations two times per day

## 2015-10-20 NOTE — Telephone Encounter (Signed)
Sent script to pharmacy. 

## 2015-10-20 NOTE — Telephone Encounter (Signed)
Flovent is no longer cover by her insurance. Insurance allows Qvar and Pulmicort. Please advise.

## 2016-01-06 ENCOUNTER — Telehealth: Payer: Self-pay | Admitting: Neurology

## 2016-01-06 MED ORDER — MORPHINE SULFATE ER 15 MG PO TBCR: 15.0000 mg | EXTENDED_RELEASE_TABLET | Freq: Two times a day (BID) | ORAL | Status: DC

## 2016-01-06 NOTE — Telephone Encounter (Signed)
Patient  is calling to get a written Rx for medication morphine (MS CONTIN) 15 MG 12 hr tablet #60. I advised the Rx will be ready in 24 hours unless the nurse advises otherwise. The patient also advised her husband will pay her account balance when he picks up the Rx.

## 2016-01-06 NOTE — Telephone Encounter (Signed)
RAS is ooo today.  Rx. awaiting Dr. Rhea Belton sig/fim

## 2016-01-06 NOTE — Telephone Encounter (Signed)
RX for morphine placed up front for pick up.

## 2016-01-25 ENCOUNTER — Ambulatory Visit (INDEPENDENT_AMBULATORY_CARE_PROVIDER_SITE_OTHER): Payer: Managed Care, Other (non HMO) | Admitting: Neurology

## 2016-01-25 ENCOUNTER — Encounter: Payer: Self-pay | Admitting: Neurology

## 2016-01-25 VITALS — BP 106/70 | HR 66 | Resp 14 | Ht 63.5 in | Wt 142.5 lb

## 2016-01-25 DIAGNOSIS — M791 Myalgia, unspecified site: Secondary | ICD-10-CM

## 2016-01-25 DIAGNOSIS — M51379 Other intervertebral disc degeneration, lumbosacral region without mention of lumbar back pain or lower extremity pain: Secondary | ICD-10-CM

## 2016-01-25 DIAGNOSIS — M5481 Occipital neuralgia: Secondary | ICD-10-CM

## 2016-01-25 DIAGNOSIS — F5111 Primary hypersomnia: Secondary | ICD-10-CM

## 2016-01-25 DIAGNOSIS — M542 Cervicalgia: Secondary | ICD-10-CM

## 2016-01-25 DIAGNOSIS — M5137 Other intervertebral disc degeneration, lumbosacral region: Secondary | ICD-10-CM

## 2016-01-25 DIAGNOSIS — G894 Chronic pain syndrome: Secondary | ICD-10-CM | POA: Diagnosis not present

## 2016-01-25 MED ORDER — MORPHINE SULFATE ER 15 MG PO TBCR: 15.0000 mg | EXTENDED_RELEASE_TABLET | Freq: Two times a day (BID) | ORAL | Status: DC

## 2016-01-25 MED ORDER — CYCLOBENZAPRINE HCL 10 MG PO TABS: 10.0000 mg | ORAL_TABLET | Freq: Three times a day (TID) | ORAL | Status: DC | PRN
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
## 2016-06-02 ENCOUNTER — Encounter: Payer: Self-pay | Admitting: Neurology

## 2016-06-02 ENCOUNTER — Ambulatory Visit (INDEPENDENT_AMBULATORY_CARE_PROVIDER_SITE_OTHER): Payer: Managed Care, Other (non HMO) | Admitting: Neurology

## 2016-06-02 VITALS — BP 106/76 | HR 74 | Resp 14 | Ht 63.5 in | Wt 141.5 lb

## 2016-06-02 DIAGNOSIS — G8929 Other chronic pain: Secondary | ICD-10-CM

## 2016-06-02 DIAGNOSIS — R32 Unspecified urinary incontinence: Secondary | ICD-10-CM | POA: Insufficient documentation

## 2016-06-02 DIAGNOSIS — G894 Chronic pain syndrome: Secondary | ICD-10-CM | POA: Diagnosis not present

## 2016-06-02 DIAGNOSIS — F5111 Primary hypersomnia: Secondary | ICD-10-CM | POA: Diagnosis not present

## 2016-06-02 DIAGNOSIS — R102 Pelvic and perineal pain: Secondary | ICD-10-CM

## 2016-06-02 DIAGNOSIS — R292 Abnormal reflex: Secondary | ICD-10-CM

## 2016-06-02 DIAGNOSIS — M25512 Pain in left shoulder: Secondary | ICD-10-CM | POA: Diagnosis not present

## 2016-06-02 DIAGNOSIS — N949 Unspecified condition associated with female genital organs and menstrual cycle: Secondary | ICD-10-CM

## 2016-06-02 DIAGNOSIS — M542 Cervicalgia: Secondary | ICD-10-CM | POA: Diagnosis not present

## 2016-06-02 MED ORDER — MORPHINE SULFATE 15 MG PO TABS: 15.0000 mg | ORAL_TABLET | Freq: Two times a day (BID) | ORAL | 0 refills | Status: DC | PRN
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
## 2016-06-12 ENCOUNTER — Ambulatory Visit
Admission: RE | Admit: 2016-06-12 | Discharge: 2016-06-12 | Disposition: A | Discharge status: Home / Self Care | Payer: Managed Care, Other (non HMO) | Source: Ambulatory Visit | Attending: Neurology | Admitting: Neurology

## 2016-06-12 DIAGNOSIS — M542 Cervicalgia: Secondary | ICD-10-CM | POA: Diagnosis not present

## 2016-06-12 DIAGNOSIS — R32 Unspecified urinary incontinence: Secondary | ICD-10-CM

## 2016-06-12 DIAGNOSIS — R292 Abnormal reflex: Secondary | ICD-10-CM

## 2016-06-17 ENCOUNTER — Telehealth: Payer: Self-pay | Admitting: Neurology

## 2016-06-17 ENCOUNTER — Telehealth: Payer: Self-pay | Admitting: *Deleted

## 2016-06-17 DIAGNOSIS — M5010 Cervical disc disorder with radiculopathy, unspecified cervical region: Secondary | ICD-10-CM

## 2016-06-17 NOTE — Telephone Encounter (Signed)
-----   Message from Britt Bottom, MD sent at 06/17/2016  8:33 AM EDT ----- Please let her know that the MRI of the cervical spine does show a lot of degenerative changes. At C2-C3, the changes are worse to the left and could put some pressure on the left C3 nerve root that could cause some left shoulder region pain. If she is not doing any better, we can refer to a surgeon for an opinion.

## 2016-06-17 NOTE — Telephone Encounter (Addendum)
Patient called to request results of October 1st MRI, please call [phone] to advise.

## 2016-06-17 NOTE — Telephone Encounter (Signed)
I have spoken with Samantha [name] this morning and per RAS, advised MRI of c-spine shows a lot of degen changes, some worse on the left; possibly putting pressure on a nerve root and thereby causing left shoulder region pain.  OK for referral to NS if pain has continued.  She sts. it has and accepted NS referral.  Referral ordered in EPIC/fim

## 2016-06-17 NOTE — Telephone Encounter (Signed)
See result note/fim 

## 2016-07-08 ENCOUNTER — Other Ambulatory Visit: Payer: Self-pay

## 2016-07-08 ENCOUNTER — Other Ambulatory Visit: Payer: Self-pay | Admitting: Neurology

## 2016-07-08 MED ORDER — ALBUTEROL SULFATE HFA 108 (90 BASE) MCG/ACT IN AERS: 2.0000 | INHALATION_SPRAY | Freq: Four times a day (QID) | RESPIRATORY_TRACT | 1 refills | Status: DC | PRN

## 2016-07-18 DIAGNOSIS — L509 Urticaria, unspecified: Secondary | ICD-10-CM | POA: Insufficient documentation

## 2016-07-29 DIAGNOSIS — G43909 Migraine, unspecified, not intractable, without status migrainosus: Secondary | ICD-10-CM | POA: Insufficient documentation

## 2016-07-29 DIAGNOSIS — G458 Other transient cerebral ischemic attacks and related syndromes: Secondary | ICD-10-CM | POA: Insufficient documentation

## 2016-07-29 DIAGNOSIS — K582 Mixed irritable bowel syndrome: Secondary | ICD-10-CM | POA: Insufficient documentation

## 2016-08-23 ENCOUNTER — Telehealth: Payer: Self-pay | Admitting: Neurology

## 2016-08-23 MED ORDER — MORPHINE SULFATE 15 MG PO TABS: 15.0000 mg | ORAL_TABLET | Freq: Two times a day (BID) | ORAL | 0 refills | Status: DC | PRN

## 2016-08-23 NOTE — Telephone Encounter (Signed)
Rx. awaiting RAS sig/fim 

## 2016-08-23 NOTE — Telephone Encounter (Signed)
MSIR rx. up front [facility]/fim

## 2016-08-23 NOTE — Telephone Encounter (Signed)
Pt request refill for morphine (MSIR) 15 MG tablet

## 2016-10-04 ENCOUNTER — Encounter: Payer: Self-pay | Admitting: Neurology

## 2016-10-04 ENCOUNTER — Ambulatory Visit (INDEPENDENT_AMBULATORY_CARE_PROVIDER_SITE_OTHER): Payer: Managed Care, Other (non HMO) | Admitting: Neurology

## 2016-10-04 VITALS — BP 106/70 | HR 66 | Resp 6 | Ht 63.5 in | Wt 147.0 lb

## 2016-10-04 DIAGNOSIS — G894 Chronic pain syndrome: Secondary | ICD-10-CM | POA: Diagnosis not present

## 2016-10-04 DIAGNOSIS — M542 Cervicalgia: Secondary | ICD-10-CM | POA: Diagnosis not present

## 2016-10-04 DIAGNOSIS — G5601 Carpal tunnel syndrome, right upper limb: Secondary | ICD-10-CM

## 2016-10-04 DIAGNOSIS — M5010 Cervical disc disorder with radiculopathy, unspecified cervical region: Secondary | ICD-10-CM | POA: Diagnosis not present

## 2016-10-04 DIAGNOSIS — M797 Fibromyalgia: Secondary | ICD-10-CM

## 2016-10-04 MED ORDER — MORPHINE SULFATE 15 MG PO TABS: 15.0000 mg | ORAL_TABLET | Freq: Two times a day (BID) | ORAL | 0 refills | Status: DC | PRN

## 2016-10-04 MED ORDER — ETODOLAC 400 MG PO TABS: 400.0000 mg | ORAL_TABLET | Freq: Two times a day (BID) | ORAL | 4 refills | Status: DC

## 2016-10-04 NOTE — Progress Notes (Signed)
[name] DOB: May 16, 1971  REFERRING DOCTOR OR PCP:  Dr. Jesse Fall SOURCE: Patient and [facility] is external give me to sleepiness and pain sleepiness ogy records  _________________________________   HISTORICAL  CHIEF COMPLAINT:  Chief Complaint  Patient presents with  . Neck Pain    Since stopping MS Contin and only taking MSIR, she sts. abd. pain has resolved, neck and back pain are worse, coccygeal pain is the same/fim  . Back Pain  . Coccygeal Pain    HISTORY OF PRESENT ILLNESS:  Samantha [name] is a 46 year old woman with neck pain, arm pain and lower back pain  Neck/arm Pain:   She reports more pain in the neck going down the right arm to the right 3-5th fingers.     She also has pain in the upper back/posterior shoulder region.    Pain increases with movements  and she is wearing a splint on the right.     She has had a C5-C6 fusion in 1999. MRI 06/13/2016 showed Multilevel degenerative changes. At C2-C3 and is moderately severe right foraminal narrowing at could lead to right C3 nerve root compression. He 3 C4 there are degenerative changes at multiple upon both the C4 nerve root prior ACDF. At C6-C7 there is mild spinal stenosis.    She saw a neurosurgeon who felt the changes were not bad enough for surgery.     TPI and/or shoulder injections have not helped much in the past.    PT did not help in the past.  Other arm pain:   She sees Dr. Burney Gauze, ortho, who is seeing her for her elbow pain.         Low back, mid back pain and leg pain:   She also reports axial lower back and coccyx region .  If she sits along time, pain will radiate into legs.     Pain increases with activity,  walking or prolonged sitting.    Pain is mildly better with heat.   MSIR 15 mg helps slightly but she tries to just take one/day.     Oxycodone caused GI upset and did not help.  Fentanyl adhesive was not tolerated.  PT did not  help in the past.  Some TPIs helped but the past few have not lasted long.     MRI report  from 08/09/2011 showed L4-L5 and L5-S1 degenerative disc disease but no definite nerve root compression.  She reports another MRI 2015 with Dr. Patrice Paradise (ortho spine) .     In the past, she has not been able to tolerate gabapentin or Cymbalta   Daytime sleepiness:     She has excessive daytime sleepiness helped by Adderall (Dr. Lenna Gilford writes).  She is sleepy every day and takes naps without benefit..   She is noting some sleep onset insomnia despite valium which was helping more in the past.  Flexeril helps her stay asleep.     PSG 06/27/2013 showed no significant OSA (AHI = 1).  She had moderately severe snoring. There is no significant periodic limb movements of sleep. Sleep efficiency was 68% and she had a delay sleep latency. She had normal amount of slow wave N3 sleep but no REM sleep.   MSLT 11/29/2013 had mean sleep latency of 9.6 minutes (borderline).   There was no sleep onset REM sleep. 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   She has sleepiness Eyes: No visual changes, double vision, eye pain.   Eyes itch at times Ear, nose and throat: No hearing loss, ear pain, nasal congestion, sore throat Cardiovascular: No chest pain, palpitations Respiratory: No shortness of breath at rest or with exertion.   No wheezes GastrointestinaI: No nausea, vomiting, diarrhea, abdominal pain, fecal incontinence Genitourinary: No dysuria, urinary retention or frequency.  No nocturia. Musculoskeletal: as above Integumentary: No rash, pruritus, skin lesions Neurological: as above Psychiatric: Mild depression and anxiety. \Endocrine: No palpitations, diaphoresis, change in appetite, change in weigh.  Reports increased thirst Hematologic/Lymphatic: No anemia, purpura,  petechiae. Allergic/Immunologic: No itchy/runny eyes, nasal congestion, recent allergic reactions, rashes  ALLERGIES: Allergies  Allergen Reactions  . Bactrim Anaphylaxis  . Cymbalta [Duloxetine Hcl] Shortness Of Breath  . Adhesive [Tape]   . Ciprofloxacin   . Doxycycline     Possibly joint pain or itch   . Erythromycin     Upset stomach  . Hydrocodone-Homatropine     Not true allergy. "irritated"  . Levofloxacin     Weakness, myalgia  . Lorazepam Other (See Comments)    Hallucination  . Meloxicam Itching  . Naproxen Sodium Itching  . Omeprazole-Sodium Bicarbonate     Chest pain-sore all over-HA  . Ranitidine     Severe joint pain  . Tetracyclines & Related   . Triamcinolone   . Neosporin [Neomycin-Bacitracin Zn-Polymyx] Rash    HOME MEDICATIONS:  Current Outpatient Prescriptions:  .  albuterol (PROVENTIL HFA;VENTOLIN HFA) 108 (90 Base) MCG/ACT inhaler, Inhale 2 puffs into the lungs every 6 (six) hours as needed., Disp: 1 Inhaler, Rfl: 1 .  atenolol-chlorthalidone (TENORETIC) 50-25 MG per tablet, Take 1 tablet by mouth daily., Disp: , Rfl:  .  azelastine (ASTELIN) 0.1 % nasal spray, Place 1 spray into both nostrils every evening. Use in each nostril as directed, Disp: 30 mL, Rfl: 5 .  beclomethasone (QVAR) 80 MCG/ACT inhaler, Inhale 2 puffs into the lungs 2 (two) times daily., Disp: 1 Inhaler, Rfl: 5 .  butalbital-acetaminophen-caffeine (FIORICET WITH CODEINE) 50-325-40-30 MG per capsule, Take 1 capsule by mouth every 4 (four) hours as needed., Disp: , Rfl:  .  cholecalciferol (VITAMIN D) 1000 UNITS tablet, Take by mouth Nightly., Disp: , Rfl:  .  cyclobenzaprine (FLEXERIL) 10 MG tablet, TAKE ONE (1) TABLET BY MOUTH 3 TIMES DAILY AS NEEDED, Disp: 90 tablet, Rfl: 3 .  diazepam (VALIUM) 10 MG tablet, 1/2 pill as needed daily for anxiety prn 1 pill at bedtime, Disp: 45 tablet, Rfl: 5 .  fluticasone (FLONASE) 50 MCG/ACT nasal spray, Place 2 sprays into the nose daily., Disp: ,  Rfl:  .  fluticasone (FLOVENT HFA) 110 MCG/ACT inhaler, Inhale 1 puff into the lungs 2 (two) times daily., Disp: 1 Inhaler, Rfl: 1 .  levonorgestrel (MIRENA) 20 MCG/24HR IUD, by Intrauterine route., Disp: , Rfl:  .  morphine (MSIR) 15 MG tablet, Take 1 tablet (15 mg total) by mouth 2 (two) times daily as needed for severe pain., Disp: 60 tablet, Rfl: 0 .  rosuvastatin (CRESTOR) 10 MG tablet, Take 10 mg by mouth., Disp: , Rfl:  .  sertraline (ZOLOFT) 100 MG tablet, Take 150 mg by mouth Nightly. , Disp: , Rfl:  .  amphetamine-dextroamphetamine (ADDERALL) 10 MG tablet, Take 2 in am, 1 in pm daily., Disp: , Rfl:  .  etodolac (LODINE) 400 MG tablet, Take 1 tablet (400 mg total) by  mouth 2 (two) times daily., Disp: 60 tablet, Rfl: 4  PAST MEDICAL HISTORY: Past Medical History:  Diagnosis Date  . ADD (attention deficit disorder)   . Arthritis   . Asthma   . Chronic back pain   . Chronic neck pain   . Endometriosis   . Fibromyalgia   . GERD (gastroesophageal reflux disease)   . Hernia   . Hypertension   . IBS (irritable bowel syndrome)   . IC (interstitial cystitis)   . Multiple allergies   . OCD (obsessive compulsive disorder)   . Raynaud disease   . Vertigo     PAST SURGICAL HISTORY: Past Surgical History:  Procedure Laterality Date  . BLADDER SURGERY    . CERVICAL FUSION    . CESAREAN SECTION    . HAND SURGERY    . LESION REMOVAL  08/23/2012   Procedure: LESION REMOVAL HAND;  Surgeon: Cammie Sickle., MD;  Location: [facility];  Service: Orthopedics;  Laterality: Left;  . ROTATOR CUFF REPAIR      FAMILY HISTORY: Family History  Problem Relation Age of Onset  . ADD / ADHD Brother     SOCIAL HISTORY:  Social History   Social History  . Marital status: Married    Spouse name: N/A  . Number of children: N/A  . Years of education: N/A   Occupational History  . Not on file.   Social History Main Topics  . Smoking status: Never Smoker  . Smokeless  tobacco: Never Used  . Alcohol use No  . Drug use: No  . Sexual activity: Yes    Birth control/ protection: IUD   Other Topics Concern  . Not on file   Social History Narrative  . No narrative on file     PHYSICAL EXAM  Vitals:   10/04/16 1143  BP: 106/70  Pulse: 66  Resp: (!) 6  Weight: 147 lb (66.7 kg)  Height: 5' 3.5" (1.613 m)    Body mass index is 25.63 kg/m.   General: The patient is well-developed and well-nourished and in no acute distress.  Musculoskeletal: .   Tender over right > left lower cerv paraspinals, trapezius muscles.  Also tender over many FMS tender points (bilateral in trunk)   Right > left lower lumbar tenderness.  Tender right elbow.   OK Rom in shoulders  Neurologic Exam  Mental status: The patient is alert and oriented x 3 at the time of the examination. The patient has apparent normal recent and remote memory, with an apparently normal attention span and concentration ability.   Speech is normal.  Cranial nerves: Extraocular movements are full.  Facial symmetry is present.   .Facial strength is normal.  Trapezius and sternocleidomastoid strength is normal. No dysarthria is noted.    Motor:  Muscle bulk is normal.   Tone is normal. Strength is  5 / 5 in all 4 extremities   Sensory: On sensory testing, she reports reduced right hand touch / temp sensation, worse in C6 distribution.  Coordination: Cerebellar testing reveals good finger-nose-finger.  Gait and station: Station is normal.   Gait is mildly arthritic. Tandem gait is mildly wide.    Reflexes: Deep tendon reflexes are symmetric and increased in  Knees with spread,  and 2+ elsewhere bilaterally.       DIAGNOSTIC DATA (LABS, IMAGING, TESTING) - I reviewed patient records, labs, notes, testing and imaging myself where available.      ASSESSMENT AND PLAN  Chronic pain syndrome  Cervical disc disorder with radiculopathy  Neck pain  Carpal tunnel syndrome of right  wrist  Fibromyalgia   1.  Renew MSIR 15 mg up to bid.   Continue cyclobenzaprine for muscle spasms.  Add etodolac 2.   She will continue to get  Adderall and diazepam from Dr. Lenna Gilford  3.  Advised to remain active and exercises tolerated.     4.  rtc 4 months, sooner if problems rtc 4 months or call sooner if problems.   Manvi Guilliams A. Felecia Shelling, MD, PhD [id number], [id number] PM Certified in Neurology, Clinical Neurophysiology, Sleep Medicine, Pain Medicine and Neuroimaging  [facility] [address] [place], [place] [address] [phone]

## 2016-10-06 DIAGNOSIS — G8929 Other chronic pain: Secondary | ICD-10-CM | POA: Insufficient documentation

## 2016-10-06 DIAGNOSIS — M7711 Lateral epicondylitis, right elbow: Secondary | ICD-10-CM | POA: Insufficient documentation

## 2016-10-06 DIAGNOSIS — M25521 Pain in right elbow: Secondary | ICD-10-CM | POA: Insufficient documentation

## 2016-11-08 DIAGNOSIS — E782 Mixed hyperlipidemia: Secondary | ICD-10-CM | POA: Insufficient documentation

## 2016-11-08 DIAGNOSIS — E785 Hyperlipidemia, unspecified: Secondary | ICD-10-CM | POA: Insufficient documentation

## 2016-11-08 DIAGNOSIS — M1009 Idiopathic gout, multiple sites: Secondary | ICD-10-CM | POA: Insufficient documentation

## 2016-12-20 DIAGNOSIS — D6861 Antiphospholipid syndrome: Secondary | ICD-10-CM | POA: Insufficient documentation

## 2017-01-02 ENCOUNTER — Other Ambulatory Visit: Payer: Self-pay

## 2017-01-02 ENCOUNTER — Telehealth: Payer: Self-pay | Admitting: Neurology

## 2017-01-02 MED ORDER — MORPHINE SULFATE 15 MG PO TABS: 15.0000 mg | ORAL_TABLET | Freq: Two times a day (BID) | ORAL | 0 refills | Status: DC | PRN

## 2017-01-02 NOTE — Telephone Encounter (Signed)
Dr.Sater is out of the office the whole week, he has prescribed MSIR for the patient. Dr.Sethi will only do a 30 day refill for patient, he is the work in.

## 2017-01-02 NOTE — Telephone Encounter (Signed)
LMOM (identified vm) that rx. will be up front [facility] tomorrow for her to pick up; hopefully she will hear back from [facility] regarding an appt. with Dr. Jon Billings

## 2017-01-02 NOTE — Telephone Encounter (Signed)
Patient called office in reference to being seen sooner with Dr. Felecia Shelling.  Patient states she is having pain from wrist going all the way up to her neck and right eye.  Patient has gone to her PCP (stating nothing they can do for her)  and an eye Dr. Patient is barley able to use the right arm and sleeps with braces on her wrist.  Unable to life or grab anything, can barely write.  Please call

## 2017-01-02 NOTE — Telephone Encounter (Signed)
I have spoken with Iyonna this afternoon.  She c/o increasing neck pain.  In Oct. 2017, she was referred to [facility], sts. was seen there around Christmas time. Sts. she has not been able to get thru to [facility] to make another appt. I have spoken with Helene Kelp at [facility]. Dr. Joya Salm has retired, they received the one message that pt. left this morning and are sending her chart to Dr. Trenton Gammon for review, to see if he is able to see her. Pt. requesting r/f of Morphine. As Dr. Felecia Shelling is out of the office all week, I will check with w/I physician Dr. Leonie Man to see if he is able to r/f this/fim

## 2017-01-05 ENCOUNTER — Other Ambulatory Visit: Payer: Self-pay | Admitting: Allergy and Immunology

## 2017-01-17 ENCOUNTER — Encounter: Payer: Self-pay | Admitting: Allergy and Immunology

## 2017-01-17 ENCOUNTER — Ambulatory Visit (INDEPENDENT_AMBULATORY_CARE_PROVIDER_SITE_OTHER): Payer: Managed Care, Other (non HMO) | Admitting: Allergy and Immunology

## 2017-01-17 VITALS — BP 128/70 | HR 72 | Resp 16

## 2017-01-17 DIAGNOSIS — T7840XD Allergy, unspecified, subsequent encounter: Secondary | ICD-10-CM

## 2017-01-17 DIAGNOSIS — K219 Gastro-esophageal reflux disease without esophagitis: Secondary | ICD-10-CM

## 2017-01-17 DIAGNOSIS — J3089 Other allergic rhinitis: Secondary | ICD-10-CM | POA: Diagnosis not present

## 2017-01-17 DIAGNOSIS — L500 Allergic urticaria: Secondary | ICD-10-CM

## 2017-01-17 DIAGNOSIS — J4520 Mild intermittent asthma, uncomplicated: Secondary | ICD-10-CM

## 2017-01-17 MED ORDER — BECLOMETHASONE DIPROP HFA 80 MCG/ACT IN AERB: 2.0000 | INHALATION_SPRAY | Freq: Two times a day (BID) | RESPIRATORY_TRACT | 3 refills | Status: DC

## 2017-01-17 NOTE — Patient Instructions (Addendum)
  1. Continue action plan for asthma flare:   A. Qvar 80 redihaler 2 inhalations twice a day with spacer  B. ProAir HFA 2 puffs every 4-6 hours if needed  2. Continue EpiPen, Benadryl, M.D./ER for allergic reaction  3. Continue treatment of allergic rhinitis:   A. Nasacort nasal spray - 1 sprays each nostril one time per day  B. Azelastine nasal spray - 1-2 sprays each nostril two time per day  4. Can continue over-the-counter antihistamine if needed  5. Blood - lupus anticoagulant panel, anticardiolipin antibody   6. Omalizumab / Xolair for urticaria and allergies?   7. Return to clinic in 12 weeks or earlier if problem. Call about omalizumab / Xolair

## 2017-01-17 NOTE — Progress Notes (Signed)
Follow-up Note  Referring Provider: Maylon Peppers, MD Primary Provider: Maylon Peppers, MD Date of Office Visit: 01/17/2017  Subjective:   Samantha [name] (DOB: 03/27/1971) is a 46 y.o. female who returns to the [facility] on 01/17/2017 in re-evaluation of the following:  HPI: Amaria returns to this clinic in reevaluation of her asthma and allergic rhinoconjunctivitis and LPR and history of recurrent allergic reactions with urticaria. I last saw her in this clinic approximately 16 months ago.  Overall her respiratory status has been very good while consistently using medical therapy. She has not required a systemic steroid or an antibiotic to treat a respiratory tract issues since I have last seen her int his clinic. She only utilizes her action plan which includes Qvar and pro-air a few times a month. Her upper airways are doing well as long she continues to use a nasal steroid and a nasal antihistamine on a pretty consistent basis.  She has continued to have problems with her hives. On a daily basis she has a few hives pop up and occasionally she will develop a global outbreak. Once again there is no obvious provoking factor giving rise to these issues.  In addition, she informs me that recently she took etodolac and developed a erythematous swelling episode on her face after one week of use. She develops itchiness when using Aleve and a similar type reaction with meloxicam. She can take ibuprofen without any problem but this doesn't appear to help her pain issue.  When she was last seen in this clinic she had lots of arthralgia and we obtain blood tests looking for a significant rheumatologic disease which were all negative. 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  Allergies as of 01/17/2017      Reactions   Bactrim Anaphylaxis   Cymbalta [duloxetine Hcl] Shortness Of Breath   Adhesive [tape]    Ciprofloxacin    Doxycycline    Possibly joint pain or itch   Erythromycin    Upset stomach   Hydrocodone-homatropine    Not true allergy. "irritated"   Levofloxacin    Weakness, myalgia   Lorazepam Other (See Comments)   Hallucination   Meloxicam Itching   Naproxen Sodium Itching   Omeprazole-sodium Bicarbonate    Chest pain-sore all over-HA   Ranitidine    Severe joint pain   Tetracyclines & Related    Triamcinolone    Neosporin [neomycin-bacitracin Zn-polymyx] Rash      Medication List      ADDERALL 10 MG tablet Generic drug:  amphetamine-dextroamphetamine Take 2 in am, 1 in pm daily.   albuterol 108 (90 Base) MCG/ACT inhaler Commonly known as:  PROVENTIL HFA;VENTOLIN HFA Inhale 2 puffs into the lungs every 6 (six) hours as needed.   atenolol-chlorthalidone 50-25 MG tablet Commonly known as:  TENORETIC Take 1 tablet by mouth daily.   azelastine 0.1 % nasal spray Commonly known as:  ASTELIN Place 1 spray into both nostrils every evening. Use in each nostril as directed   beclomethasone 80 MCG/ACT inhaler Commonly known as:  QVAR Inhale 2 puffs into the lungs 2 (two) times daily.   butalbital-acetaminophen-caffeine 50-325-40-30 MG capsule Commonly known as:  FIORICET WITH CODEINE Take 1 capsule by  mouth every 4 (four) hours as needed.   cholecalciferol 1000 units tablet Commonly known as:  VITAMIN D Take by mouth Nightly.   cyclobenzaprine 10 MG tablet Commonly known as:  FLEXERIL TAKE ONE (1) TABLET BY MOUTH 3 TIMES DAILY AS NEEDED   diazepam 10 MG tablet Commonly known as:  VALIUM 1/2 pill as needed daily for anxiety prn 1 pill at bedtime   EPINEPHrine 0.3 mg/0.3 mL Soaj injection Commonly known as:  EPI-PEN Inject 0.3 mg into the skin.   etodolac 400 MG  tablet Commonly known as:  LODINE Take 1 tablet (400 mg total) by mouth 2 (two) times daily.   fluticasone 110 MCG/ACT inhaler Commonly known as:  FLOVENT HFA Inhale 1 puff into the lungs 2 (two) times daily.   fluticasone 50 MCG/ACT nasal spray Commonly known as:  FLONASE Place 2 sprays into the nose daily.   levonorgestrel 20 MCG/24HR IUD Commonly known as:  MIRENA by Intrauterine route.   morphine 15 MG tablet Commonly known as:  MSIR Take 1 tablet (15 mg total) by mouth 2 (two) times daily as needed for severe pain.   rosuvastatin 10 MG tablet Commonly known as:  CRESTOR Take 10 mg by mouth.   sertraline 100 MG tablet Commonly known as:  ZOLOFT Take 150 mg by mouth Nightly.   triamcinolone 55 MCG/ACT Aero nasal inhaler Commonly known as:  NASACORT Place into the nose.       Past Medical History:  Diagnosis Date  . ADD (attention deficit disorder)   . Arthritis   . Asthma   . Chronic back pain   . Chronic neck pain   . Endometriosis   . Fibromyalgia   . GERD (gastroesophageal reflux disease)   . Hernia   . Hypertension   . IBS (irritable bowel syndrome)   . IC (interstitial cystitis)   . Multiple allergies   . OCD (obsessive compulsive disorder)   . Raynaud disease   . Vertigo     Past Surgical History:  Procedure Laterality Date  . BLADDER SURGERY    . CERVICAL FUSION    . CESAREAN SECTION    . HAND SURGERY    . LESION REMOVAL  08/23/2012   Procedure: LESION REMOVAL HAND;  Surgeon: Cammie Sickle., MD;  Location: [facility];  Service: Orthopedics;  Laterality: Left;  . ROTATOR CUFF REPAIR      Review of systems negative except as noted in HPI / PMHx or noted below:  Review of Systems  Constitutional: Negative.   HENT: Negative.   Eyes: Negative.   Respiratory: Negative.   Cardiovascular: Negative.   Gastrointestinal: Negative.   Genitourinary: Negative.   Musculoskeletal: Negative.   Skin: Negative.   Neurological:  Negative.   Endo/Heme/Allergies: Negative.   Psychiatric/Behavioral: Negative.      Objective:   Vitals:   01/17/17 1732  BP: 128/70  Pulse: 72  Resp: 16          Physical Exam  Constitutional: She is well-developed, well-nourished, and in no distress.  HENT:  Head: Normocephalic.  Right Ear: Tympanic membrane, external ear and ear canal normal.  Left Ear: Tympanic membrane, external ear and ear canal normal.  Nose: Nose normal. No mucosal edema or rhinorrhea.  Mouth/Throat: Uvula is midline, oropharynx is clear and moist and mucous membranes are normal. No oropharyngeal exudate.  Eyes: Conjunctivae are normal.  Neck: Trachea normal. No tracheal tenderness present. No tracheal deviation present. No thyromegaly present.  Cardiovascular:  Normal rate, regular rhythm, S1 normal, S2 normal and normal heart sounds.   No murmur heard. Pulmonary/Chest: Breath sounds normal. No stridor. No respiratory distress. She has no wheezes. She has no rales.  Musculoskeletal: She exhibits no edema.  Lymphadenopathy:       Head (right side): No tonsillar adenopathy present.       Head (left side): No tonsillar adenopathy present.    She has no cervical adenopathy.  Neurological: She is alert. Gait normal.  Skin: No rash noted. She is not diaphoretic. No erythema. Nails show no clubbing.  Psychiatric: Mood and affect normal.    Diagnostics:    Spirometry was performed and demonstrated an FEV1 of 2.38 at 87 % of predicted.  Assessment and Plan:   1. Asthma, mild intermittent, well-controlled   2. Other allergic rhinitis   3. LPRD (laryngopharyngeal reflux disease)   4. Allergic reaction, subsequent encounter   5. Allergic urticaria     1. Continue action plan for asthma flare:   A. Qvar 80 redihaler 2 inhalations twice a day with spacer  B. ProAir HFA 2 puffs every 4-6 hours if needed  2. Continue EpiPen, Benadryl, M.D./ER for allergic reaction  3. Continue treatment of allergic  rhinitis:   A. Nasacort nasal spray - 1 sprays each nostril one time per day  B. Azelastine nasal spray - 1-2 sprays each nostril two time per day  4. Can continue over-the-counter antihistamine if needed  5. Blood - lupus anticoagulant panel, anticardiolipin antibody   6. Omalizumab / Xolair for urticaria and allergies?   7. Return to clinic in 12 weeks or earlier if problem. Call about omalizumab / Loreli Slot appears to be doing okay regarding her respiratory tract issue and I will have her continue to use anti-inflammatory agents as noted above for her upper and lower respiratory tract. Her urticaria is somewhat out of control and she may be a candidate for omalizumab and I've given her literature on this form of treatment today and she is presently considering this option. I did order blood tests in further investigation of her anticardiolipin antibody laboratory status and I will contact her with the results of these blood tests once they're available for review.  Allena Katz, MD Allergy / Immunology [place]

## 2017-01-19 LAB — CARDIOLIPIN ANTIBODIES, IGG, IGM, IGA: Anticardiolipin IgG: 37 GPL U/mL — ABNORMAL HIGH (ref 0–14)

## 2017-01-19 LAB — LUPUS ANTICOAGULANT PANEL
Dilute Viper Venom Time: 31.2 s (ref 0.0–47.0)
PTT Lupus Anticoagulant: 34.9 s (ref 0.0–51.9)

## 2017-01-23 ENCOUNTER — Telehealth: Payer: Self-pay | Admitting: Allergy and Immunology

## 2017-01-23 MED ORDER — EPINEPHRINE 0.3 MG/0.3ML IJ SOAJ: INTRAMUSCULAR | 1 refills | Status: DC

## 2017-01-23 MED ORDER — ALBUTEROL SULFATE HFA 108 (90 BASE) MCG/ACT IN AERS: 2.0000 | INHALATION_SPRAY | RESPIRATORY_TRACT | 1 refills | Status: DC | PRN

## 2017-01-23 MED ORDER — AZELASTINE HCL 0.1 % NA SOLN: 1.0000 | Freq: Every evening | NASAL | 5 refills | Status: DC

## 2017-01-23 NOTE — Telephone Encounter (Signed)
Sent rx in . Patient notified

## 2017-01-23 NOTE — Telephone Encounter (Signed)
Pt called and said that only the Qvar was called in and nothing else, Need pro air, nasacort, azelastine,alb of neb, epi-pen called into [facility] in [place]. [phone]

## 2017-02-07 ENCOUNTER — Ambulatory Visit (INDEPENDENT_AMBULATORY_CARE_PROVIDER_SITE_OTHER): Payer: Managed Care, Other (non HMO) | Admitting: Neurology

## 2017-02-07 ENCOUNTER — Telehealth: Payer: Self-pay | Admitting: *Deleted

## 2017-02-07 ENCOUNTER — Encounter: Payer: Self-pay | Admitting: Neurology

## 2017-02-07 VITALS — BP 106/60 | HR 72 | Resp 16 | Ht 63.5 in | Wt 144.5 lb

## 2017-02-07 DIAGNOSIS — G8929 Other chronic pain: Secondary | ICD-10-CM

## 2017-02-07 DIAGNOSIS — R260 Ataxic gait: Secondary | ICD-10-CM | POA: Diagnosis not present

## 2017-02-07 DIAGNOSIS — M797 Fibromyalgia: Secondary | ICD-10-CM

## 2017-02-07 DIAGNOSIS — M545 Low back pain: Secondary | ICD-10-CM

## 2017-02-07 DIAGNOSIS — M542 Cervicalgia: Secondary | ICD-10-CM | POA: Diagnosis not present

## 2017-02-07 DIAGNOSIS — H9192 Unspecified hearing loss, left ear: Secondary | ICD-10-CM

## 2017-02-07 MED ORDER — METHOCARBAMOL 500 MG PO TABS: 500.0000 mg | ORAL_TABLET | Freq: Three times a day (TID) | ORAL | 5 refills | Status: DC

## 2017-02-07 MED ORDER — MORPHINE SULFATE 15 MG PO TABS: 15.0000 mg | ORAL_TABLET | Freq: Two times a day (BID) | ORAL | 0 refills | Status: DC | PRN

## 2017-02-07 NOTE — Progress Notes (Signed)
[name] DOB: 24-Dec-1970  REFERRING DOCTOR OR PCP:  Dr. Jesse Fall SOURCE: Patient and [facility] is external give me to sleepiness and pain sleepiness ogy records  _________________________________   HISTORICAL  CHIEF COMPLAINT:  Chief Complaint  Patient presents with  . Neck Pain    Sts. neck and back pain are worse. She was not able to tolerate Etodoloac--sts. it caused a facial rash, and so PCP has rec. she avoid all NSAIDS.   PCP has increased Adderall to 20mg  tid./fim  . Back Pain  . Coccygeal Pain    HISTORY OF PRESENT ILLNESS:  Samantha [name] is a 46 year old woman with neck pain, arm pain and lower back pain.    Neck/arm Pain:   She reports pain in her right neck radiating to the back of the head and also in the upper back on the right.   She reports having a NCV/EMG of the right arm and they reportedly showed (study at Dr. Bertis Ruddy office).   No nerve damage and she says they told her it was probably coming more from her neck.    She has had a C5-C6 fusion in 1999. MRI 06/13/2016 showed multilevel degenerative changes. At C2-C3 there is moderately severe right foraminal narrowing at could lead to right C3 nerve root compression. He 3 C4 there are degenerative changes at multiple upon both the C4 nerve root prior ACDF.  At C6-C7 there is mild spinal stenosis.   She has seen Dr. Annette Stable.     TPI and/or shoulder injections have not helped much in the past.    PT did not help in the past.    Etodolac caused swelling so they stopped.     Low back, mid back pain and leg pain:  She feels her lower back pain is doing worse. The worst pain is in the sacral coccyx region    Pain is mildly better with heat.   PT did not help in the past.  Some TPIs helped but the past few have not lasted long.     MRI report  from 08/09/2011 showed L4-L5 and L5-S1 degenerative disc disease but no definite nerve root compression.  She  reports another MRI 2015 with Dr. Patrice Paradise (ortho spine) .     In the past, she has not been able to tolerate gabapentin or Cymbalta.   PT has not helped.     Pain Med's:   MSIR 15 mg helps slightly but she tries to < one/day.  It worsens constipation    Oxycodone caused GI upset and did not help.  Fentanyl adhesive was not tolerated.     She is also on Flexeril but it makes her sleepy.   She takes valium once daily (Dr. Lenna Gilford prescribes).     Daytime sleepiness:    This is stable.    She reports that the excessive daytime sleepiness is better with Adderall.   She is sleepy every day and takes naps without benefit..   She is noting some sleep onset insomnia despite valium which was helping more in the past.  Flexeril helps her stay asleep.     PSG 06/27/2013 showed no significant OSA (AHI = 1).  She had moderately severe snoring. There is no significant periodic limb movements of sleep. Sleep efficiency was 68% and she had a delay sleep latency. She had normal amount of slow wave N3 sleep but no REM sleep.   MSLT 11/29/2013 had  mean sleep latency of 9.6 minutes (borderline).   There was no sleep onset REM sleep. 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       Other:   She has a positive anti-cardiolipin IgG but other tests (ANA, RF, Lup Anticoag are negative)  REVIEW OF SYSTEMS: Constitutional: No fevers, chills, sweats, or change in appetite.   She has sleepiness Eyes: No visual changes, double vision, eye pain.   Eyes itch at times Ear, nose and throat: No hearing loss, ear pain, nasal congestion, sore throat Cardiovascular: No chest pain, palpitations Respiratory: No shortness of breath at rest or with exertion.   No wheezes GastrointestinaI: No nausea, vomiting, diarrhea, abdominal pain, fecal incontinence Genitourinary: No dysuria, urinary retention or frequency.  No nocturia. Musculoskeletal: as above Integumentary: No rash, pruritus, skin lesions Neurological: as  above Psychiatric: Mild depression and anxiety. \Endocrine: No palpitations, diaphoresis, change in appetite, change in weigh.  Reports increased thirst Hematologic/Lymphatic: No anemia, purpura, petechiae. Allergic/Immunologic: No itchy/runny eyes, nasal congestion, recent allergic reactions, rashes  ALLERGIES: Allergies  Allergen Reactions  . Bactrim Anaphylaxis  . Cymbalta [Duloxetine Hcl] Shortness Of Breath  . Adhesive [Tape]   . Ciprofloxacin   . Doxycycline     Possibly joint pain or itch   . Erythromycin     Upset stomach  . Hydrocodone-Homatropine     Not true allergy. "irritated"  . Levofloxacin     Weakness, myalgia  . Lorazepam Other (See Comments)    Hallucination  . Meloxicam Itching  . Naproxen Sodium Itching  . Omeprazole-Sodium Bicarbonate     Chest pain-sore all over-HA  . Ranitidine     Severe joint pain  . Tetracyclines & Related   . Triamcinolone   . Etodolac Rash  . Neosporin [Neomycin-Bacitracin Zn-Polymyx] Rash    HOME MEDICATIONS:  Current Outpatient Prescriptions:  .  albuterol (PROVENTIL HFA;VENTOLIN HFA) 108 (90 Base) MCG/ACT inhaler, Inhale 2 puffs into the lungs every 4 (four) hours as needed., Disp: 1 Inhaler, Rfl: 1 .  amphetamine-dextroamphetamine (ADDERALL) 20 MG tablet, Take 20 mg by mouth 3 (three) times daily., Disp: , Rfl:  .  atenolol-chlorthalidone (TENORETIC) 50-25 MG tablet, Take 1 tablet by mouth daily., Disp: , Rfl:  .  azelastine (ASTELIN) 0.1 % nasal spray, Place 1 spray into both nostrils every evening. Use in each nostril as directed, Disp: 30 mL, Rfl: 5 .  beclomethasone (QVAR) 80 MCG/ACT inhaler, Inhale 2 puffs into the lungs 2 (two) times daily., Disp: 1 Inhaler, Rfl: 5 .  Beclomethasone Diprop HFA (QVAR REDIHALER) 80 MCG/ACT AERB, Inhale 2 puffs into the lungs 2 (two) times daily., Disp: 1 Inhaler, Rfl: 3 .  butalbital-acetaminophen-caffeine (FIORICET WITH CODEINE) 50-325-40-30 MG per capsule, Take 1 capsule by mouth  every 4 (four) hours as needed., Disp: , Rfl:  .  cholecalciferol (VITAMIN D) 1000 UNITS tablet, Take by mouth Nightly., Disp: , Rfl:  .  cyclobenzaprine (FLEXERIL) 10 MG tablet, TAKE ONE (1) TABLET BY MOUTH 3 TIMES DAILY AS NEEDED, Disp: 90 tablet, Rfl: 3 .  diazepam (VALIUM) 10 MG tablet, 1/2 pill as needed daily for anxiety prn 1 pill at bedtime, Disp: 45 tablet, Rfl: 5 .  EPINEPHrine 0.3 mg/0.3 mL IJ SOAJ injection, Use as directed for severe allergic reaction, Disp: 2 Device, Rfl: 1 .  fluticasone (FLONASE) 50 MCG/ACT nasal spray, Place 2 sprays into the nose daily., Disp: , Rfl:  .  fluticasone (FLOVENT HFA) 110 MCG/ACT inhaler, Inhale 1 puff into the  lungs 2 (two) times daily., Disp: 1 Inhaler, Rfl: 1 .  levonorgestrel (MIRENA) 20 MCG/24HR IUD, by Intrauterine route., Disp: , Rfl:  .  morphine (MSIR) 15 MG tablet, Take 1 tablet (15 mg total) by mouth 2 (two) times daily as needed for severe pain., Disp: 60 tablet, Rfl: 0 .  rosuvastatin (CRESTOR) 10 MG tablet, Take 10 mg by mouth., Disp: , Rfl:  .  sertraline (ZOLOFT) 100 MG tablet, Take 150 mg by mouth Nightly. , Disp: , Rfl:  .  triamcinolone (NASACORT) 55 MCG/ACT AERO nasal inhaler, Place into the nose., Disp: , Rfl:  .  methocarbamol (ROBAXIN) 500 MG tablet, Take 1 tablet (500 mg total) by mouth 3 (three) times daily., Disp: 90 tablet, Rfl: 5  PAST MEDICAL HISTORY: Past Medical History:  Diagnosis Date  . ADD (attention deficit disorder)   . Arthritis   . Asthma   . Chronic back pain   . Chronic neck pain   . Endometriosis   . Fibromyalgia   . GERD (gastroesophageal reflux disease)   . Hernia   . Hypertension   . IBS (irritable bowel syndrome)   . IC (interstitial cystitis)   . Multiple allergies   . OCD (obsessive compulsive disorder)   . Raynaud disease   . Vertigo     PAST SURGICAL HISTORY: Past Surgical History:  Procedure Laterality Date  . BLADDER SURGERY    . CERVICAL FUSION    . CESAREAN SECTION    . HAND  SURGERY    . LESION REMOVAL  08/23/2012   Procedure: LESION REMOVAL HAND;  Surgeon: Cammie Sickle., MD;  Location: [facility];  Service: Orthopedics;  Laterality: Left;  . ROTATOR CUFF REPAIR      FAMILY HISTORY: Family History  Problem Relation Age of Onset  . ADD / ADHD Brother     SOCIAL HISTORY:  Social History   Social History  . Marital status: Married    Spouse name: N/A  . Number of children: N/A  . Years of education: N/A   Occupational History  . Not on file.   Social History Main Topics  . Smoking status: Never Smoker  . Smokeless tobacco: Never Used  . Alcohol use No  . Drug use: No  . Sexual activity: Yes    Birth control/ protection: IUD   Other Topics Concern  . Not on file   Social History Narrative  . No narrative on file     PHYSICAL EXAM  Vitals:   02/07/17 1142  BP: 106/60  Pulse: 72  Resp: 16  Weight: 144 lb 8 oz (65.5 kg)  Height: 5' 3.5" (1.613 m)    Body mass index is 25.2 kg/m.   General: The patient is well-developed and well-nourished and in no acute distress.  Musculoskeletal: .   She has tenderness over the lower cervical paraspinal muscles, right greater than left. He is also tenderness in the trapezius and rhomboid muscles. There is moderate tenderness over many of the fibromyalgia tender points, especially in the upper trunk.    She she also has   Right > left lower lumbar tenderness.  Tender right elbow.   OK Rom in shoulders  Neurologic Exam  Mental status: The patient is alert and oriented x 3 at the time of the examination. The patient has apparent normal recent and remote memory, with an apparently normal attention span and concentration ability.   Speech is normal.     Cranial nerves: Extraocular  movements are full.  Facial strength and sensation is normal.  Trapezius and sternocleidomastoid strength is normal. No dysarthria is noted.    Hearing is reduced on the left and Weber lateralizes to the  right  Motor:  Muscle bulk is normal.   Tone is normal. Strength is  5 / 5 in all 4 extremities   Sensory: On sensory testing, she reports reduced left vibration sensation relative to the left.   Coordination: Cerebellar testing reveals good finger-nose-finger.  Gait and station: Station is normal.   Gait is mildly arthritic. Tandem gait is wide.    Reflexes: Deep tendon reflexes are symmetric and increased in knees with spread,  and 2+ elsewhere bilaterally.       DIAGNOSTIC DATA (LABS, IMAGING, TESTING) - I reviewed patient records, labs, notes, testing and imaging myself where available.      ASSESSMENT AND PLAN  Neck pain  Fibromyalgia  Chronic midline low back pain without sciatica  Ataxic gait  Hearing loss of left ear, unspecified hearing loss type   1.   Renew MSIR 15 mg up to bid.  Change cyclobenzaprine to Robaxin for muscle spasms.    2.   She will continue to get  Adderall and diazepam from Dr. Lenna Gilford  3.  MRI brain for left hearing loss and ataxia.   R/o schwannoma and demyelination 4.   Advised to remain active and exercises tolerated.     5.   rtc 4 months or call sooner if problems.   Brittiany Wiehe A. Felecia Shelling, MD, PhD 2/48/2500, 37:04 PM Certified in Neurology, Clinical Neurophysiology, Sleep Medicine, Pain Medicine and Neuroimaging  [facility] [address], [place], [place] [address] [phone]

## 2017-02-07 NOTE — Telephone Encounter (Signed)
Please clarify on lab note. Unsure what to tell patient please advise. Patient has questions and unable to answer

## 2017-02-09 NOTE — Telephone Encounter (Signed)
Please advise 

## 2017-02-13 NOTE — Telephone Encounter (Signed)
Please inform patient that unless she becomes symptomatic at this point in time there is no need for any therapy. She can consider taking a daily aspirin, if she can tolerate aspirin, as a preventative. However, whether or not aspirin will prevent anything in her circumstance is unknown. She may just have a low level laboratory abnormality that will never give rise to any type of condition. We will discuss further with RV.

## 2017-02-14 NOTE — Telephone Encounter (Signed)
Patient has been advised and informed of labs and recommendations.

## 2017-04-05 ENCOUNTER — Telehealth: Payer: Self-pay | Admitting: Neurology

## 2017-04-05 MED ORDER — MORPHINE SULFATE 15 MG PO TABS: 15.0000 mg | ORAL_TABLET | Freq: Two times a day (BID) | ORAL | 0 refills | Status: DC | PRN

## 2017-04-05 NOTE — Addendum Note (Signed)
Addended by: France Ravens I on: 04/05/2017 12:53 PM   Modules accepted: Orders

## 2017-04-05 NOTE — Telephone Encounter (Signed)
Patient requesting refill of morphine (MSIR) 15 MG tablet.

## 2017-04-05 NOTE — Telephone Encounter (Signed)
Rx. up front [facility]/fim 

## 2017-04-05 NOTE — Telephone Encounter (Signed)
Rx. awaiting YY sig, as RAS is ooo today/fim 

## 2017-04-25 ENCOUNTER — Encounter: Payer: Self-pay | Admitting: Allergy and Immunology

## 2017-04-25 ENCOUNTER — Ambulatory Visit (INDEPENDENT_AMBULATORY_CARE_PROVIDER_SITE_OTHER): Payer: Managed Care, Other (non HMO) | Admitting: Allergy and Immunology

## 2017-04-25 VITALS — BP 118/68 | HR 72 | Resp 16

## 2017-04-25 DIAGNOSIS — J3089 Other allergic rhinitis: Secondary | ICD-10-CM

## 2017-04-25 DIAGNOSIS — L500 Allergic urticaria: Secondary | ICD-10-CM

## 2017-04-25 DIAGNOSIS — R768 Other specified abnormal immunological findings in serum: Secondary | ICD-10-CM

## 2017-04-25 DIAGNOSIS — J4520 Mild intermittent asthma, uncomplicated: Secondary | ICD-10-CM

## 2017-04-25 DIAGNOSIS — R760 Raised antibody titer: Secondary | ICD-10-CM

## 2017-04-25 NOTE — Progress Notes (Signed)
Follow-up Note  Referring Provider: Maylon Peppers, MD Primary Provider: Maylon Peppers, MD Date of Office Visit: 04/25/2017  Subjective:   Samantha [name] (DOB: 1970/10/20) is a 46 y.o. female who returns to the [facility] on 04/25/2017 in re-evaluation of the following:  HPI: Samantha [name] returns to this clinic in reevaluation of her asthma and allergic rhinitis and chronic urticaria and history of LPR. I last saw her in this clinic on 01/17/2017 at which point in time she was doing very well.  She has continued to do very well. Her urticaria is under very good control and only appears to exacerbate about 1 time per week for which she will take some Benadryl. Apparently cat exposure is a major trigger for this to occur.   Her asthma has been under excellent control and she rarely uses a short acting bronchodilator and she has not had to activate her action plan and has not required a systemic steroid.  Her nose has really been doing quite well. She has been using a combination of a nasal steroid and and nasal antihistamine which she thinks is working quite well and she has not required an antibiotic to treat an episode of sinusitis.  She has not been having many issues with reflux at this point and her throat is doing well.  She had neck surgery in June with a good result. She is scheduled to have a head MRI in evaluation of right sided pressure-like headaches.  During her last visit she informed me that she apparently had a history of being anticardiolipin antibody positive which we investigated with blood tests during that visit.   Allergies as of 04/25/2017      Reactions   Bactrim Anaphylaxis   Cymbalta [duloxetine Hcl] Shortness Of Breath   Adhesive [tape]    Ciprofloxacin    Doxycycline    Possibly joint pain or itch   Erythromycin    Upset stomach   Hydrocodone-homatropine    Not true allergy. "irritated"   Levofloxacin    Weakness, myalgia   Lorazepam Other (See Comments)   Hallucination   Meloxicam Itching   Naproxen Sodium Itching   Omeprazole-sodium Bicarbonate    Chest pain-sore all over-HA   Ranitidine    Severe joint pain   Tetracyclines & Related    Triamcinolone    Etodolac Rash   Neosporin [neomycin-bacitracin Zn-polymyx] Rash      Medication List      albuterol 108 (90 Base) MCG/ACT inhaler Commonly known as:  PROVENTIL HFA;VENTOLIN HFA Inhale 2 puffs into the lungs every 4 (four) hours as needed.   amphetamine-dextroamphetamine 20 MG tablet Commonly known as:  ADDERALL Take 20 mg by mouth 3 (three) times daily.   atenolol-chlorthalidone 50-25 MG tablet Commonly known as:  TENORETIC Take 1 tablet by mouth daily.   azelastine 0.1 % nasal spray Commonly known as:  ASTELIN Place 1 spray into both nostrils every evening. Use in each nostril as directed   beclomethasone 80 MCG/ACT inhaler Commonly known as:  QVAR Inhale 2 puffs into the lungs 2 (two) times daily.   beclomethasone 80 MCG/ACT inhaler Commonly known as:  QVAR REDIHALER Inhale 2 puffs into the lungs 2 (two) times daily.   butalbital-acetaminophen-caffeine 50-325-40-30 MG capsule Commonly known as:  FIORICET WITH CODEINE Take 1 capsule by mouth every 4 (four) hours as needed.   cholecalciferol 1000 units tablet Commonly known as:  VITAMIN D Take by mouth Nightly.   cyclobenzaprine 10  MG tablet Commonly known as:  FLEXERIL TAKE ONE (1) TABLET BY MOUTH 3 TIMES DAILY AS NEEDED   diazepam 10 MG tablet Commonly known as:  VALIUM 1/2 pill as needed daily for anxiety prn 1 pill at bedtime   EPINEPHrine 0.3 mg/0.3 mL Soaj injection Commonly known as:  EPI-PEN Use as directed for severe allergic reaction   fluticasone 110 MCG/ACT inhaler Commonly known as:  FLOVENT HFA Inhale 1 puff into the lungs 2 (two) times daily.   fluticasone 50 MCG/ACT nasal spray Commonly known as:  FLONASE Place 2 sprays into the nose daily.     levonorgestrel 20 MCG/24HR IUD Commonly known as:  MIRENA by Intrauterine route.   methocarbamol 500 MG tablet Commonly known as:  ROBAXIN Take 1 tablet (500 mg total) by mouth 3 (three) times daily.   morphine 15 MG tablet Commonly known as:  MSIR Take 1 tablet (15 mg total) by mouth 2 (two) times daily as needed for severe pain.   rosuvastatin 10 MG tablet Commonly known as:  CRESTOR Take 10 mg by mouth.   sertraline 100 MG tablet Commonly known as:  ZOLOFT Take 150 mg by mouth Nightly.   triamcinolone 55 MCG/ACT Aero nasal inhaler Commonly known as:  NASACORT Place into the nose.       Past Medical History:  Diagnosis Date  . ADD (attention deficit disorder)   . Arthritis   . Asthma   . Chronic back pain   . Chronic neck pain   . Endometriosis   . Fibromyalgia   . GERD (gastroesophageal reflux disease)   . Hernia   . Hypertension   . IBS (irritable bowel syndrome)   . IC (interstitial cystitis)   . Multiple allergies   . OCD (obsessive compulsive disorder)   . Raynaud disease   . Vertigo     Past Surgical History:  Procedure Laterality Date  . BLADDER SURGERY    . CERVICAL FUSION    . CESAREAN SECTION    . HAND SURGERY    . LESION REMOVAL  08/23/2012   Procedure: LESION REMOVAL HAND;  Surgeon: Cammie Sickle., MD;  Location: [facility];  Service: Orthopedics;  Laterality: Left;  . ROTATOR CUFF REPAIR      Review of systems negative except as noted in HPI / PMHx or noted below:  Review of Systems  Constitutional: Negative.   HENT: Negative.   Eyes: Negative.   Respiratory: Negative.   Cardiovascular: Negative.   Gastrointestinal: Negative.   Genitourinary: Negative.   Musculoskeletal: Negative.   Skin: Negative.   Neurological: Negative.   Endo/Heme/Allergies: Negative.   Psychiatric/Behavioral: Negative.      Objective:   Vitals:   04/25/17 1207  BP: 118/68  Pulse: 72  Resp: 16          Physical Exam   Constitutional: She is well-developed, well-nourished, and in no distress.  HENT:  Head: Normocephalic.  Right Ear: Tympanic membrane, external ear and ear canal normal.  Left Ear: Tympanic membrane, external ear and ear canal normal.  Nose: Nose normal. No mucosal edema or rhinorrhea.  Mouth/Throat: Uvula is midline, oropharynx is clear and moist and mucous membranes are normal. No oropharyngeal exudate.  Eyes: Conjunctivae are normal.  Neck: Trachea normal. No tracheal tenderness present. No tracheal deviation present. No thyromegaly present.  Cardiovascular: Normal rate, regular rhythm, S1 normal, S2 normal and normal heart sounds.   No murmur heard. Pulmonary/Chest: Breath sounds normal. No stridor. No respiratory  distress. She has no wheezes. She has no rales.  Musculoskeletal: She exhibits no edema.  Lymphadenopathy:       Head (right side): No tonsillar adenopathy present.       Head (left side): No tonsillar adenopathy present.    She has no cervical adenopathy.  Neurological: She is alert. Gait normal.  Skin: No rash noted. She is not diaphoretic. No erythema. Nails show no clubbing.  Psychiatric: Mood and affect normal.    Diagnostics: Results of blood tests obtained 01/17/2017 identified a anticardiolipin IgG antibody titer of 37 U/L with a negative IgM and negative IgA anticardiolipin and normal lupus anticoagulant analysis.   Spirometry was performed and demonstrated an FEV1 of 2.44 at 84 % of predicted.  The patient had an Asthma Control Test with the following results: ACT Total Score: 18.    Assessment and Plan:   1. Asthma, mild intermittent, well-controlled   2. Other allergic rhinitis   3. Allergic urticaria   4. Anticardiolipin antibody positive     1. Continue action plan for asthma flare:   A. Qvar 80 Redihaler 2 inhalations twice a day with spacer  B. ProAir HFA 2 puffs every 4-6 hours if needed  2. Continue EpiPen, Benadryl, M.D./ER for allergic  reaction  3. Continue treatment of allergic rhinitis:   A. Nasacort nasal spray - 1 sprays each nostril one time per day  B. Azelastine nasal spray - 1-2 sprays each nostril two time per day  4. Can continue over-the-counter antihistamine if needed  5. Continue daily aspirin use for general preventative health  6. Return to clinic in 6 months or earlier if problem.    7. Obtain fall flu vaccine  Samantha [name] appears to be doing relatively well on her current plan and we will continue to have her use the medications noted above to address her atopic state. Her anticardiolipin antibody positivity is relatively low and she is already taking an aspirin for general health so she is probably covered concerning this issue at this point. I will see her back in this clinic in 6 months or earlier if there is a problem. I encouraged her to obtain the flu vaccine but her previous history with receiving the flu vaccine has not been very good and I doubt that she will actually get this vaccination.  Allena Katz, MD Allergy / Immunology [place]

## 2017-04-25 NOTE — Patient Instructions (Addendum)
  1. Continue action plan for asthma flare:   A. Qvar 80 Redihaler 2 inhalations twice a day with spacer  B. ProAir HFA 2 puffs every 4-6 hours if needed  2. Continue EpiPen, Benadryl, M.D./ER for allergic reaction  3. Continue treatment of allergic rhinitis:   A. Nasacort nasal spray - 1 sprays each nostril one time per day  B. Azelastine nasal spray - 1-2 sprays each nostril two time per day  4. Can continue over-the-counter antihistamine if needed  5. Cantinue daily aspirin use for general preventative health  6. Return to clinic in 6 months or earlier if problem.    7. Obtain fall flu vaccine

## 2017-04-30 ENCOUNTER — Ambulatory Visit
Admission: RE | Admit: 2017-04-30 | Discharge: 2017-04-30 | Disposition: A | Discharge status: Home / Self Care | Payer: Managed Care, Other (non HMO) | Source: Ambulatory Visit | Attending: Neurology | Admitting: Neurology

## 2017-04-30 DIAGNOSIS — R260 Ataxic gait: Secondary | ICD-10-CM | POA: Diagnosis not present

## 2017-04-30 DIAGNOSIS — H9192 Unspecified hearing loss, left ear: Secondary | ICD-10-CM

## 2017-04-30 MED ORDER — GADOBENATE DIMEGLUMINE 529 MG/ML IV SOLN
10.0000 mL | Freq: Once | INTRAVENOUS | Status: AC | PRN
  Administered 2017-04-30: 10 mL via INTRAVENOUS

## 2017-05-02 ENCOUNTER — Other Ambulatory Visit: Payer: Self-pay | Admitting: Allergy

## 2017-05-02 ENCOUNTER — Telehealth: Payer: Self-pay | Admitting: *Deleted

## 2017-05-02 MED ORDER — EPINEPHRINE 0.3 MG/0.3ML IJ SOAJ: 0.3000 mg | Freq: Once | INTRAMUSCULAR | 1 refills | Status: AC

## 2017-05-02 NOTE — Telephone Encounter (Signed)
-----   Message from Britt Bottom, MD sent at 05/01/2017  7:37 PM EDT ----- Please let her know that the MRI of the brain was normal.

## 2017-05-02 NOTE — Telephone Encounter (Signed)
I have spoken with pt. this morning and per RAS, advised her that MRI brain is normal.  She verbalized understanding of same/fim

## 2017-06-05 ENCOUNTER — Other Ambulatory Visit: Payer: Self-pay | Admitting: Allergy

## 2017-06-05 MED ORDER — ALBUTEROL SULFATE HFA 108 (90 BASE) MCG/ACT IN AERS: 2.0000 | INHALATION_SPRAY | RESPIRATORY_TRACT | 1 refills | Status: DC | PRN

## 2017-06-12 ENCOUNTER — Ambulatory Visit (INDEPENDENT_AMBULATORY_CARE_PROVIDER_SITE_OTHER): Payer: Managed Care, Other (non HMO) | Admitting: Neurology

## 2017-06-12 ENCOUNTER — Encounter: Payer: Self-pay | Admitting: Neurology

## 2017-06-12 VITALS — BP 97/55 | HR 76 | Resp 18 | Ht 63.5 in | Wt 140.5 lb

## 2017-06-12 DIAGNOSIS — M5416 Radiculopathy, lumbar region: Secondary | ICD-10-CM | POA: Diagnosis not present

## 2017-06-12 DIAGNOSIS — G894 Chronic pain syndrome: Secondary | ICD-10-CM

## 2017-06-12 DIAGNOSIS — M542 Cervicalgia: Secondary | ICD-10-CM

## 2017-06-12 DIAGNOSIS — R29898 Other symptoms and signs involving the musculoskeletal system: Secondary | ICD-10-CM

## 2017-06-12 MED ORDER — MORPHINE SULFATE 15 MG PO TABS: 15.0000 mg | ORAL_TABLET | Freq: Two times a day (BID) | ORAL | 0 refills | Status: DC | PRN

## 2017-06-12 NOTE — Progress Notes (Signed)
[name] DOB: 1971/01/26  REFERRING DOCTOR OR PCP:  Dr. Jesse Fall SOURCE: Patient and [facility] is external give me to sleepiness and pain sleepiness ogy records  _________________________________   HISTORICAL  CHIEF COMPLAINT:  Chief Complaint  Patient presents with  . Neck Pain    Sts. coccygeal pain is worse.  Since last ov, she's had cervical surgery (fusion).  Sts. due to continued insomnia, pcp added Xanax 1mg  qhs, in addition to Diazepam. Samantha [name]  . Back Pain  . Coccygeal Pain    HISTORY OF PRESENT ILLNESS:  Samantha [name] is a 46 year old woman with neck pain, arm pain and lower back pain.    Update 06/12/2017:  Since the last visit, she has had cervical fusion at C6-C7 (Dr. Annette Stable) and previously, she had fusion at C5-C6.   She continues to report neck pain and arm pain but is able to use her arms better since the surgery.        Unfortunately, she is now noting more pain in the lower back especially in the coccyx region. She also has more leg pain, right worse than left.   She notes the right leg feels weak when she stands up and she notes numbness going into her legs, right worse than left.     She is on MSIR 15 mg po, usually 1/2 bid up to bid.   She is also on diazepam 10 mg po up to tid (qd or bid most days) and Flexeril prn.   She takes Xanax at bedtime 1mg .  She tolerates the medications well and has not escalated.    She notes some depression despite zoloft.   Cymbalta was poorly tolerated in the past.    ____________________________________ From 02/07/2017:  Neck/arm Pain:   She reports pain in her right neck radiating to the back of the head and also in the upper back on the right.   She reports having a NCV/EMG of the right arm and they reportedly showed (study at Dr. Bertis Ruddy office).   No nerve damage and she says they told her it was probably coming more from her neck.    She has had  a C5-C6 fusion in 1999. MRI 06/13/2016 showed multilevel degenerative changes. At C2-C3 there is moderately severe right foraminal narrowing at could lead to right C3 nerve root compression. He 3 C4 there are degenerative changes at multiple upon both the C4 nerve root prior ACDF.  At C6-C7 there is mild spinal stenosis.   She has seen Dr. Annette Stable.     TPI and/or shoulder injections have not helped much in the past.    PT did not help in the past.    Etodolac caused swelling so they stopped.     Low back, mid back pain and leg pain:  She feels her lower back pain is doing worse. The worst pain is in the sacral coccyx region    Pain is mildly better with heat.   PT did not help in the past.  Some TPIs helped but the past few have not lasted long.     MRI report  from 08/09/2011 showed L4-L5 and L5-S1 degenerative disc disease but no definite nerve root compression.  She reports another MRI 2015 with Dr. Patrice Paradise (ortho spine) .     In the past, she has not been able to tolerate gabapentin or Cymbalta.   PT has not helped.     Pain Med's:  MSIR 15 mg helps slightly but she tries to < one/day.  It worsens constipation    Oxycodone caused GI upset and did not help.  Fentanyl adhesive was not tolerated.     She is also on Flexeril but it makes her sleepy.   She takes valium once daily (Dr. Lenna Gilford prescribes).     Daytime sleepiness:    This is stable.    She reports that the excessive daytime sleepiness is better with Adderall.   She is sleepy every day and takes naps without benefit..   She is noting some sleep onset insomnia despite valium which was helping more in the past.  Flexeril helps her stay asleep.     PSG 06/27/2013 showed no significant OSA (AHI = 1).  She had moderately severe snoring. There is no significant periodic limb movements of sleep. Sleep efficiency was 68% and she had a delay sleep latency. She had normal amount of slow wave N3 sleep but no REM sleep.   MSLT 11/29/2013 had mean sleep latency of  9.6 minutes (borderline).   There was no sleep onset REM sleep. 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       Other:   She has a positive anti-cardiolipin IgG but other tests (ANA, RF, Lup Anticoag are negative)  REVIEW OF SYSTEMS: Constitutional: No fevers, chills, sweats, or change in appetite.   She has sleepiness Eyes: No visual changes, double vision, eye pain.   Eyes itch at times Ear, nose and throat: No hearing loss, ear pain, nasal congestion, sore throat Cardiovascular: No chest pain, palpitations Respiratory: No shortness of breath at rest or with exertion.   No wheezes GastrointestinaI: No nausea, vomiting, diarrhea, abdominal pain, fecal incontinence Genitourinary: No dysuria, urinary retention or frequency.  No nocturia. Musculoskeletal: as above Integumentary: No rash, pruritus, skin lesions Neurological: as above Psychiatric: Mild depression and anxiety. \Endocrine: No palpitations, diaphoresis, change in appetite, change in weigh.  Reports increased thirst Hematologic/Lymphatic: No anemia, purpura, petechiae. Allergic/Immunologic: No itchy/runny eyes, nasal congestion, recent allergic reactions, rashes  ALLERGIES: Allergies  Allergen Reactions  . Bactrim Anaphylaxis  . Cymbalta [Duloxetine Hcl] Shortness Of Breath  . Other Hives    Insect stings  . Sulfur Itching  . Adhesive [Tape]   . Ciprofloxacin   . Doxycycline     Possibly joint pain or itch   . Erythromycin     Upset stomach  . Hydrocodone-Homatropine     Not true allergy. "irritated"  . Levofloxacin     Weakness, myalgia  . Lorazepam Other (See Comments)    Hallucination  . Meloxicam Itching  . Naproxen Sodium Itching  . Omeprazole-Sodium Bicarbonate     Chest pain-sore all over-HA  . Ranitidine     Severe joint pain  . Tetracyclines & Related   . Triamcinolone   . Etodolac Rash  . Neosporin [Neomycin-Bacitracin Zn-Polymyx] Rash    HOME  MEDICATIONS:  Current Outpatient Prescriptions:  .  albuterol (PROVENTIL HFA;VENTOLIN HFA) 108 (90 Base) MCG/ACT inhaler, Inhale 2 puffs into the lungs every 4 (four) hours as needed., Disp: 1 Inhaler, Rfl: 1 .  ALPRAZolam (XANAX) 1 MG tablet, Take 1 mg by mouth., Disp: , Rfl:  .  amphetamine-dextroamphetamine (ADDERALL) 20 MG tablet, Take 20 mg by mouth 3 (three) times daily., Disp: , Rfl:  .  atenolol-chlorthalidone (TENORETIC) 50-25 MG tablet, Take 1 tablet by mouth daily., Disp: , Rfl:  .  azelastine (ASTELIN) 0.1 % nasal spray, Place 1 spray  into both nostrils every evening. Use in each nostril as directed, Disp: 30 mL, Rfl: 5 .  Beclomethasone Diprop HFA (QVAR REDIHALER) 80 MCG/ACT AERB, Inhale 2 puffs into the lungs 2 (two) times daily., Disp: 1 Inhaler, Rfl: 3 .  butalbital-acetaminophen-caffeine (FIORICET WITH CODEINE) 50-325-40-30 MG per capsule, Take 1 capsule by mouth every 4 (four) hours as needed., Disp: , Rfl:  .  cholecalciferol (VITAMIN D) 1000 UNITS tablet, Take by mouth Nightly., Disp: , Rfl:  .  Cyanocobalamin (VITAMIN B 12 PO), Take by mouth as needed., Disp: , Rfl:  .  cyclobenzaprine (FLEXERIL) 10 MG tablet, TAKE ONE (1) TABLET BY MOUTH 3 TIMES DAILY AS NEEDED, Disp: 90 tablet, Rfl: 3 .  diazepam (VALIUM) 10 MG tablet, 1/2 pill as needed daily for anxiety prn 1 pill at bedtime, Disp: 45 tablet, Rfl: 5 .  EPINEPHrine 0.3 mg/0.3 mL IJ SOAJ injection, Use as directed for severe allergic reaction, Disp: 2 Device, Rfl: 1 .  fluticasone (FLONASE) 50 MCG/ACT nasal spray, Place 2 sprays into the nose daily., Disp: , Rfl:  .  fluticasone (FLOVENT HFA) 110 MCG/ACT inhaler, Inhale 1 puff into the lungs 2 (two) times daily., Disp: 1 Inhaler, Rfl: 1 .  levonorgestrel (MIRENA) 20 MCG/24HR IUD, by Intrauterine route., Disp: , Rfl:  .  MAGNESIUM PO, Take by mouth as needed., Disp: , Rfl:  .  morphine (MSIR) 15 MG tablet, Take 1 tablet (15 mg total) by mouth 2 (two) times daily as needed for  severe pain., Disp: 60 tablet, Rfl: 0 .  rosuvastatin (CRESTOR) 10 MG tablet, Take 10 mg by mouth., Disp: , Rfl:  .  sertraline (ZOLOFT) 100 MG tablet, Take 150 mg by mouth Nightly. , Disp: , Rfl:  .  triamcinolone (NASACORT) 55 MCG/ACT AERO nasal inhaler, Use one spray in each nostril once daily., Disp: , Rfl:   PAST MEDICAL HISTORY: Past Medical History:  Diagnosis Date  . ADD (attention deficit disorder)   . Arthritis   . Asthma   . Chronic back pain   . Chronic neck pain   . Endometriosis   . Fibromyalgia   . GERD (gastroesophageal reflux disease)   . Hernia   . Hypertension   . IBS (irritable bowel syndrome)   . IC (interstitial cystitis)   . Multiple allergies   . OCD (obsessive compulsive disorder)   . Raynaud disease   . Vertigo     PAST SURGICAL HISTORY: Past Surgical History:  Procedure Laterality Date  . BLADDER SURGERY    . CERVICAL FUSION    . CESAREAN SECTION    . HAND SURGERY    . LESION REMOVAL  08/23/2012   Procedure: LESION REMOVAL HAND;  Surgeon: Cammie Sickle., MD;  Location: [facility];  Service: Orthopedics;  Laterality: Left;  . ROTATOR CUFF REPAIR      FAMILY HISTORY: Family History  Problem Relation Age of Onset  . ADD / ADHD Brother     SOCIAL HISTORY:  Social History   Social History  . Marital status: Married    Spouse name: N/A  . Number of children: N/A  . Years of education: N/A   Occupational History  . Not on file.   Social History Main Topics  . Smoking status: Never Smoker  . Smokeless tobacco: Never Used  . Alcohol use No  . Drug use: No  . Sexual activity: Yes    Birth control/ protection: IUD   Other Topics Concern  .  Not on file   Social History Narrative  . No narrative on file     PHYSICAL EXAM  Vitals:   06/12/17 1119  BP: (!) 97/55  Pulse: 76  Resp: 18  Weight: 140 lb 8 oz (63.7 kg)  Height: 5' 3.5" (1.613 m)    Body mass index is 24.5 kg/m.   General: The patient is  well-developed and well-nourished and in no acute distress.   ACDF scar anteriorly  Musculoskeletal: .   She is tender ov there is moderate tenderness over the right greater than left lower lumbar paraspinal muscles and the right greater than left piriformis muscles.    Neurologic Exam  Mental status: The patient is alert and oriented x 3 at the time of the examination. The patient has apparent normal recent and remote memory, with an apparently normal attention span and concentration ability.   Speech is normal.     Cranial nerves: Extraocular movements are full.  Facial strength and sensation is normal. Trapezius strength is normal.   Hearing is reduced on the left and Weber lateralizes to the right  Motor:  Muscle bulk is normal.   Tone is normal. Strength is  5 / 5 in the arms but reduced EHL strength in her feet   Sensory: On sensory testing, she reports reduced left vibration sensation relative to the left in the entire foot.   She notes reduced touch/pain in the left L5 distribution and the right S1 distribution.   Coordination: Cerebellar testing reveals good finger-nose-finger.  Gait and station: Station is normal.   Gait is mildly arthritic. Tandem gait is wide.    Reflexes: Deep tendon reflexes are symmetric and increased in knees with spread,  They are 2 at the right ankle and 3 at the left ankle.       DIAGNOSTIC DATA (LABS, IMAGING, TESTING) - I reviewed patient records, labs, notes, testing and imaging myself where available.      ASSESSMENT AND PLAN  Lumbar radicular pain  Weakness of right lower extremity  Neck pain  Chronic pain syndrome   1.   Renew MSIR 15 mg up to bid.  Continue cyclobenzaprine .    2.   She will continue to get  Adderall and diazepam from Dr. Lenna Gilford  3.   MRI lumbar spine without contrast for lumbar radiculopathy (has sensory, motor and DTR asymmetry on exam), not responding to medications and conservative therapy (meds and has done PT  in past).   Based on results, we may need to refer for [facility] or surgery.    4.   Advised to remain active and exercises tolerated.     5.   rtc 4 months or call sooner if problems.   Samantha [name] A. Felecia Shelling, MD, PhD 86/03/6194, 09:32 AM Certified in Neurology, Clinical Neurophysiology, Sleep Medicine, Pain Medicine and Neuroimaging  [facility] [address], [place], [place] [address] [phone]

## 2017-06-14 ENCOUNTER — Telehealth: Payer: Self-pay | Admitting: Neurology

## 2017-06-14 DIAGNOSIS — R29898 Other symptoms and signs involving the musculoskeletal system: Secondary | ICD-10-CM

## 2017-06-14 DIAGNOSIS — M5416 Radiculopathy, lumbar region: Secondary | ICD-10-CM

## 2017-06-14 NOTE — Telephone Encounter (Signed)
New order for MRI L-spine without contrast in EPIC/fim

## 2017-06-14 NOTE — Telephone Encounter (Signed)
Noted,  Thank you!

## 2017-06-14 NOTE — Telephone Encounter (Signed)
Samantha [name] with [facility] contacted me and informed me the order needs to be changed to MRI Lumbar spine wo contrast because they don't do limited.

## 2017-08-17 ENCOUNTER — Other Ambulatory Visit: Payer: Self-pay | Admitting: Allergy and Immunology

## 2018-03-27 ENCOUNTER — Other Ambulatory Visit: Payer: Self-pay | Admitting: Allergy

## 2018-03-27 MED ORDER — ALBUTEROL SULFATE HFA 108 (90 BASE) MCG/ACT IN AERS: 2.0000 | INHALATION_SPRAY | RESPIRATORY_TRACT | 1 refills | Status: DC | PRN

## 2018-03-27 MED ORDER — AZELASTINE HCL 0.1 % NA SOLN: 1.0000 | Freq: Every evening | NASAL | 1 refills | Status: DC

## 2018-06-06 ENCOUNTER — Telehealth: Payer: Self-pay | Admitting: Neurology

## 2018-06-06 NOTE — Telephone Encounter (Signed)
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 1st available appt 12/26. She is on the wait list but would like to be seen sooner if possible.

## 2018-06-06 NOTE — Telephone Encounter (Signed)
Noted. I have added her to [facility] wait list and will call if a sooner appt. becomes available/fim

## 2018-07-02 ENCOUNTER — Ambulatory Visit (INDEPENDENT_AMBULATORY_CARE_PROVIDER_SITE_OTHER): Payer: Managed Care, Other (non HMO) | Admitting: Neurology

## 2018-07-02 ENCOUNTER — Telehealth: Payer: Self-pay | Admitting: Neurology

## 2018-07-02 ENCOUNTER — Encounter: Payer: Self-pay | Admitting: Neurology

## 2018-07-02 VITALS — BP 118/60 | HR 61 | Ht 63.5 in | Wt 150.5 lb

## 2018-07-02 DIAGNOSIS — G894 Chronic pain syndrome: Secondary | ICD-10-CM | POA: Diagnosis not present

## 2018-07-02 DIAGNOSIS — M542 Cervicalgia: Secondary | ICD-10-CM | POA: Diagnosis not present

## 2018-07-02 DIAGNOSIS — M5010 Cervical disc disorder with radiculopathy, unspecified cervical region: Secondary | ICD-10-CM | POA: Diagnosis not present

## 2018-07-02 DIAGNOSIS — M51379 Other intervertebral disc degeneration, lumbosacral region without mention of lumbar back pain or lower extremity pain: Secondary | ICD-10-CM

## 2018-07-02 DIAGNOSIS — F5111 Primary hypersomnia: Secondary | ICD-10-CM

## 2018-07-02 DIAGNOSIS — M5137 Other intervertebral disc degeneration, lumbosacral region: Secondary | ICD-10-CM

## 2018-07-02 DIAGNOSIS — R159 Full incontinence of feces: Secondary | ICD-10-CM

## 2018-07-02 MED ORDER — DIAZEPAM 10 MG PO TABS: ORAL_TABLET | ORAL | 5 refills | Status: DC

## 2018-07-02 MED ORDER — CYCLOBENZAPRINE HCL 10 MG PO TABS: 10.0000 mg | ORAL_TABLET | Freq: Two times a day (BID) | ORAL | 5 refills | Status: DC | PRN

## 2018-07-02 MED ORDER — AMPHETAMINE-DEXTROAMPHETAMINE 10 MG PO TABS: 10.0000 mg | ORAL_TABLET | Freq: Three times a day (TID) | ORAL | 0 refills | Status: DC

## 2018-07-02 NOTE — Telephone Encounter (Signed)
Spoke to patient she is aware of this and to call GI at [phone] if she has not heard anything in the next 2-3 business days.

## 2018-07-02 NOTE — Telephone Encounter (Signed)
Called pt. Offered appt today at 11am, check in 1030am with Dr. Felecia Shelling. She accepted. I scheduled her. She will bring updated insurance cards and med list with her.

## 2018-07-02 NOTE — Telephone Encounter (Signed)
[employer] order sent to GI. They obtain the auth and will reach out to the pt to schedule.  °

## 2018-07-02 NOTE — Progress Notes (Signed)
[name] DOB: 1970-11-11  REFERRING DOCTOR OR PCP:  Dr. Gerarda Fraction is PCP   _________________________________   HISTORICAL  CHIEF COMPLAINT:  Chief Complaint  Patient presents with  . Follow-up    RM 12, alone. Last seen 06/12/17. Here to discuss adderral. States it no longer is helping. She is homeschooling and hard for her to stay awake.    HISTORY OF PRESENT ILLNESS:  Samantha [name] is a 47 y.o. woman with neck pain, arm pain and lower back pain and excessive sleepiness.    Update 07/02/2018: She is having more excessive daytime sleepiness and attention deficit.   She had been on Adderall 20 mg tid (written by Jesse Fall then [facility], PA-C over the past year).  She reports that her current PCP is not writing stimulants so she is stretching ler last script out with 10 mg 1-2 times a day.     She is prescribed on Diazepam 10 mg po tid but takes it just once a day.   In the past, she was on opiates but has not been on any opiates for a while.  She also had been on Fioricet for migraine headaches in the past.  Earlier this year, she ws switched from Paxil to Zoloft but had eye pain and headaches.   She is now off of the antidepressant.     Currently, mood is doing ok.   She is taking valium prn (but usually once a day).      She gets a lot of neck spasms.   Flexeril has helped her pain as much as opiates did.   She notes reduced ROM in her neck.   She has bowel incontinence x a couple years but it is worse this year.     She had C6C7 fusion by Dr. Annette Stable June 2018 (previously had C5C6 surgery).        Update 06/12/2017:  Since the last visit, she has had cervical fusion at C6-C7 (Dr. Annette Stable) and previously, she had fusion at C5-C6.   She continues to report neck pain and arm pain but is able to use her arms better since the surgery.        Unfortunately, she is now noting more pain in the lower back especially in  the coccyx region. She also has more leg pain, right worse than left.   She notes the right leg feels weak when she stands up and she notes numbness going into her legs, right worse than left.     She is on MSIR 15 mg po, usually 1/2 bid up to bid.   She is also on diazepam 10 mg po up to tid (qd or bid most days) and Flexeril prn.   She takes Xanax at bedtime 1mg .  She tolerates the medications well and has not escalated.    She notes some depression despite zoloft.   Cymbalta was poorly tolerated in the past.    ____________________________________ From 02/07/2017:  Neck/arm Pain:   She reports pain in her right neck radiating to the back of the head and also in the upper back on the right.   She reports having a NCV/EMG of the right arm and they reportedly showed (study at Dr. Bertis Ruddy office).   No nerve damage and she says they told her it was probably coming more from her neck.    She has had a C5-C6 fusion in 1999. MRI 06/13/2016 showed multilevel degenerative changes. At  C2-C3 there is moderately severe right foraminal narrowing at could lead to right C3 nerve root compression. He 3 C4 there are degenerative changes at multiple upon both the C4 nerve root prior ACDF.  At C6-C7 there is mild spinal stenosis.   She has seen Dr. Annette Stable.     TPI and/or shoulder injections have not helped much in the past.    PT did not help in the past.    Etodolac caused swelling so they stopped.     Low back, mid back pain and leg pain:  She feels her lower back pain is doing worse. The worst pain is in the sacral coccyx region    Pain is mildly better with heat.   PT did not help in the past.  Some TPIs helped but the past few have not lasted long.     MRI report  from 08/09/2011 showed L4-L5 and L5-S1 degenerative disc disease but no definite nerve root compression.  She reports another MRI 2015 with Dr. Patrice Paradise (ortho spine) .     In the past, she has not been able to tolerate gabapentin or Cymbalta.   PT has not  helped.     Pain Med's:   MSIR 15 mg helps slightly but she tries to < one/day.  It worsens constipation    Oxycodone caused GI upset and did not help.  Fentanyl adhesive was not tolerated.     She is also on Flexeril but it makes her sleepy.   She takes valium once daily (Dr. Lenna Gilford prescribes).     Daytime sleepiness:    This is stable.    She reports that the excessive daytime sleepiness is better with Adderall.   She is sleepy every day and takes naps without benefit..   She is noting some sleep onset insomnia despite valium which was helping more in the past.  Flexeril helps her stay asleep.     PSG 06/27/2013 showed no significant OSA (AHI = 1).  She had moderately severe snoring. There is no significant periodic limb movements of sleep. Sleep efficiency was 68% and she had a delay sleep latency. She had normal amount of slow wave N3 sleep but no REM sleep.   MSLT 11/29/2013 had mean sleep latency of 9.6 minutes (borderline).   There was no sleep onset REM sleep. 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       Other:   She has a positive anti-cardiolipin IgG but other tests (ANA, RF, Lup Anticoag are negative)  REVIEW OF SYSTEMS: Constitutional: No fevers, chills, sweats, or change in appetite.   She has sleepiness Eyes: No visual changes, double vision, eye pain.   Eyes itch at times Ear, nose and throat: No hearing loss, ear pain, nasal congestion, sore throat Cardiovascular: No chest pain, palpitations Respiratory: No shortness of breath at rest or with exertion.   No wheezes GastrointestinaI: No nausea, vomiting, diarrhea, abdominal pain, fecal incontinence Genitourinary: No dysuria, urinary retention or frequency.  No nocturia. Musculoskeletal: as above Integumentary: No rash, pruritus, skin lesions Neurological: as above Psychiatric: Mild depression and anxiety. \Endocrine: No palpitations, diaphoresis, change in appetite, change in weigh.  Reports  increased thirst Hematologic/Lymphatic: No anemia, purpura, petechiae. Allergic/Immunologic: No itchy/runny eyes, nasal congestion, recent allergic reactions, rashes  ALLERGIES: Allergies  Allergen Reactions  . Bactrim Anaphylaxis  . Cymbalta [Duloxetine Hcl] Shortness Of Breath  . Other Hives    Insect stings  . Sulfur Itching  . Adhesive [Tape]   .  Ciprofloxacin   . Doxycycline     Possibly joint pain or itch   . Erythromycin     Upset stomach  . Hydrocodone-Homatropine     Not true allergy. "irritated"  . Levofloxacin     Weakness, myalgia  . Lorazepam Other (See Comments)    Hallucination  . Meloxicam Itching  . Naproxen Sodium Itching  . Omeprazole-Sodium Bicarbonate     Chest pain-sore all over-HA  . Ranitidine     Severe joint pain  . Tetracyclines & Related   . Tizanidine     "knocked her out"- had a rebound migraine  . Triamcinolone   . Etodolac Rash  . Neosporin [Neomycin-Bacitracin Zn-Polymyx] Rash    HOME MEDICATIONS:  Current Outpatient Medications:  .  albuterol (PROVENTIL HFA;VENTOLIN HFA) 108 (90 Base) MCG/ACT inhaler, Inhale 2 puffs into the lungs every 4 (four) hours as needed., Disp: 1 Inhaler, Rfl: 1 .  atenolol-chlorthalidone (TENORETIC) 50-25 MG tablet, Take 1 tablet by mouth daily., Disp: , Rfl:  .  azelastine (ASTELIN) 0.1 % nasal spray, Place 1 spray into both nostrils every evening. Use in each nostril as directed, Disp: 30 mL, Rfl: 1 .  butalbital-acetaminophen-caffeine (FIORICET WITH CODEINE) 50-325-40-30 MG per capsule, Take 1 capsule by mouth every 4 (four) hours as needed., Disp: , Rfl:  .  cholecalciferol (VITAMIN D) 1000 UNITS tablet, Take by mouth Nightly., Disp: , Rfl:  .  Cyanocobalamin (VITAMIN B 12 PO), Take by mouth as needed., Disp: , Rfl:  .  EPINEPHrine 0.3 mg/0.3 mL IJ SOAJ injection, Use as directed for severe allergic reaction, Disp: 2 Device, Rfl: 1 .  fluticasone (FLONASE) 50 MCG/ACT nasal spray, Place 2 sprays into the  nose daily., Disp: , Rfl:  .  fluticasone (FLOVENT HFA) 110 MCG/ACT inhaler, Inhale 1 puff into the lungs 2 (two) times daily., Disp: 1 Inhaler, Rfl: 1 .  levonorgestrel (MIRENA) 20 MCG/24HR IUD, by Intrauterine route., Disp: , Rfl:  .  MAGNESIUM PO, Take by mouth as needed., Disp: , Rfl:  .  QVAR REDIHALER 80 MCG/ACT inhaler, INHALE 2 PUFFS TWICE DAILY, Disp: 10.6 g, Rfl: 2 .  rosuvastatin (CRESTOR) 10 MG tablet, Take 10 mg by mouth., Disp: , Rfl:  .  triamcinolone (NASACORT) 55 MCG/ACT AERO nasal inhaler, Use one spray in each nostril once daily., Disp: , Rfl:  .  amphetamine-dextroamphetamine (ADDERALL) 10 MG tablet, Take 1 tablet (10 mg total) by mouth 3 (three) times daily., Disp: 90 tablet, Rfl: 0 .  cyclobenzaprine (FLEXERIL) 10 MG tablet, Take 1 tablet (10 mg total) by mouth 2 (two) times daily as needed for muscle spasms., Disp: 60 tablet, Rfl: 5 .  diazepam (VALIUM) 10 MG tablet, Up to one pill po daily for anxiety or sleep, Disp: 30 tablet, Rfl: 5  PAST MEDICAL HISTORY: Past Medical History:  Diagnosis Date  . ADD (attention deficit disorder)   . Arthritis   . Asthma   . Chronic back pain   . Chronic neck pain   . Endometriosis   . Fibromyalgia   . GERD (gastroesophageal reflux disease)   . Hernia   . Hypertension   . IBS (irritable bowel syndrome)   . IC (interstitial cystitis)   . Multiple allergies   . OCD (obsessive compulsive disorder)   . Raynaud disease   . Vertigo     PAST SURGICAL HISTORY: Past Surgical History:  Procedure Laterality Date  . BLADDER SURGERY    . CERVICAL FUSION    . CESAREAN  SECTION    . HAND SURGERY    . LESION REMOVAL  08/23/2012   Procedure: LESION REMOVAL HAND;  Surgeon: Cammie Sickle., MD;  Location: [place];  Service: Orthopedics;  Laterality: Left;  . ROTATOR CUFF REPAIR      FAMILY HISTORY: Family History  Problem Relation Age of Onset  . ADD / ADHD Brother     SOCIAL HISTORY:  Social History    Socioeconomic History  . Marital status: Married    Spouse name: Not on file  . Number of children: Not on file  . Years of education: Not on file  . Highest education level: Not on file  Occupational History  . Not on file  Social Needs  . Financial resource strain: Not on file  . Food insecurity:    Worry: Not on file    Inability: Not on file  . Transportation needs:    Medical: Not on file    Non-medical: Not on file  Tobacco Use  . Smoking status: Never Smoker  . Smokeless tobacco: Never Used  Substance and Sexual Activity  . Alcohol use: No  . Drug use: No  . Sexual activity: Yes    Birth control/protection: IUD  Lifestyle  . Physical activity:    Days per week: Not on file    Minutes per session: Not on file  . Stress: Not on file  Relationships  . Social connections:    Talks on phone: Not on file    Gets together: Not on file    Attends religious service: Not on file    Active member of club or organization: Not on file    Attends meetings of clubs or organizations: Not on file    Relationship status: Not on file  . Intimate partner violence:    Fear of current or ex partner: Not on file    Emotionally abused: Not on file    Physically abused: Not on file    Forced sexual activity: Not on file  Other Topics Concern  . Not on file  Social History Narrative  . Not on file     PHYSICAL EXAM  Vitals:   07/02/18 1059  BP: 118/60  Pulse: 61  Weight: 150 lb 8 oz (68.3 kg)  Height: 5' 3.5" (1.613 m)    Body mass index is 26.24 kg/m.   General: The patient is well-developed and well-nourished and in no acute distress.   She has an ACDF scar anteriorly  Musculoskeletal: .  She has tenderness over the lower lumbar paraspinal muscles.  She has milder tenderness over the cervical paraspinal muscles.   Reduced range of motion in the neck.  Neurologic Exam  Mental status: The patient is alert and oriented x 3 at the time of the examination. The  patient has apparent normal recent and remote memory, with an apparently normal attention span and concentration ability.   Speech is normal.     Cranial nerves: Extraocular movements are full.  Facial strength is normal. Trapezius strength is normal.     Motor:  Muscle bulk is normal.   Tone is normal. Strength is  5 / 5 in the arms but reduced EHL strength in her feet    Coordination: Cerebellar testing reveals good finger-nose-finger.  Gait and station: Station is normal.   Gait is mildly arthritic. Tandem gait is wide.    Reflexes: Deep tendon reflexes are symmetric and increased in knees with spread,  They are  2 at the right ankle and 3 at the left ankle.       DIAGNOSTIC DATA (LABS, IMAGING, TESTING) - I reviewed patient records, labs, notes, testing and imaging myself where available.      ASSESSMENT AND PLAN  Cervical disc disorder with radiculopathy of cervical region - Plan: DG Cervical Spine With Flex & Extend, MR THORACIC SPINE WO CONTRAST  Cervicalgia - Plan: MR CERVICAL SPINE WO CONTRAST, DG Cervical Spine With Flex & Extend, MR THORACIC SPINE WO CONTRAST  Degeneration of intervertebral disc of lumbosacral region  Chronic pain syndrome  Primary hypersomnia  Incontinence of feces, unspecified fecal incontinence type   1.   Continue cyclobenzaprine prn for pain and spasms    2.   I will write her for Adderall 10 mg p.o. 3 times daily and diazepam 10 mg up to 1 pill a day.  Due to interactions of butalbital with other medications, I prefer her not to be on Fioricet. 3.   MRI cervical and thoracic spine due to her pain and reports of fecal incontinence.  This will help to rule out spinal cord compression or significant spinal stenosis.  Additionally I will check regular x-rays of the cervical spine with flexion and extension to make sure there is no pseudoarthrosis. 4.   Advised to remain active and exercises tolerated.     5.   rtc 5-6 months or call sooner if  problems.   Based on results of the study she also may need to come in earlier or be referred   Richard A. Felecia Shelling, MD, PhD 84/[id number]/5993, 5:70 PM Certified in Neurology, Clinical Neurophysiology, Sleep Medicine, Pain Medicine and Neuroimaging  [facility] [address] [place], [place] [address] [phone]

## 2018-07-04 DIAGNOSIS — F411 Generalized anxiety disorder: Secondary | ICD-10-CM | POA: Insufficient documentation

## 2018-07-04 DIAGNOSIS — F32A Depression, unspecified: Secondary | ICD-10-CM | POA: Insufficient documentation

## 2018-07-04 DIAGNOSIS — F419 Anxiety disorder, unspecified: Secondary | ICD-10-CM | POA: Insufficient documentation

## 2018-07-11 ENCOUNTER — Ambulatory Visit: Payer: 59 | Admitting: Psychology

## 2018-07-24 ENCOUNTER — Ambulatory Visit (INDEPENDENT_AMBULATORY_CARE_PROVIDER_SITE_OTHER): Payer: Managed Care, Other (non HMO) | Admitting: Allergy and Immunology

## 2018-07-24 ENCOUNTER — Encounter: Payer: Self-pay | Admitting: Allergy and Immunology

## 2018-07-24 VITALS — BP 118/80 | HR 100 | Resp 20 | Ht 63.0 in | Wt 156.4 lb

## 2018-07-24 DIAGNOSIS — K219 Gastro-esophageal reflux disease without esophagitis: Secondary | ICD-10-CM | POA: Diagnosis not present

## 2018-07-24 DIAGNOSIS — L500 Allergic urticaria: Secondary | ICD-10-CM | POA: Diagnosis not present

## 2018-07-24 DIAGNOSIS — J4520 Mild intermittent asthma, uncomplicated: Secondary | ICD-10-CM

## 2018-07-24 DIAGNOSIS — J3089 Other allergic rhinitis: Secondary | ICD-10-CM

## 2018-07-24 DIAGNOSIS — Z886 Allergy status to analgesic agent status: Secondary | ICD-10-CM

## 2018-07-24 DIAGNOSIS — R760 Raised antibody titer: Secondary | ICD-10-CM

## 2018-07-24 NOTE — Progress Notes (Signed)
Follow-up Note  Referring Provider: Maylon Peppers, MD Primary Provider: Thomes Dinning, MD Date of Office Visit: 07/24/2018  Subjective:   Samantha [name] (DOB: 17-Aug-1971) is a 47 y.o. female who returns to the [facility] on 07/24/2018 in re-evaluation of the following:  HPI: Samantha [name] returns to this clinic in evaluation of her asthma and allergic rhinitis and chronic urticaria and history of LPR.  I have not seen her in this clinic since 25 April 2017.  Her asthma appears to be doing quite well.  Rarely does she need to activate an action plan or use a short acting bronchodilator.  However, she states that she took high-dose aspirin sometime in October and developed very significant stuffy nose and had some wheezing and coughing and had to use a short acting bronchodilator.  In the past the use of ibuprofen and Naprosyn has given rise to hives.  She was using 82 mg of aspirin on a daily basis for her anticardiolipin antibody positivity without any adverse effect prior to the use of high-dose aspirin.  She has not required a systemic steroid or antibiotic to treat any type of respiratory tract issue.  She believes that her nose is doing quite well at this point in time while consistently using any nasal antihistamine and a nasal steroid.  Her reflux is doing quite well at this point and she is had very little issues with LPR.  Her urticaria has for the most part been relatively inactive.  Occasionally she will develop a little red area for which she will take a Benadryl but for the most part this is not a particularly big issue at this point.  Apparently she used high dose acetaminophen on 29 October and she broke out in a painful blistering rash on her face for which she went to see her primary care doctor and was treated with Valtrex.  In about 4 days her reaction resolved.  She refuses to receive the flu vaccine.  Allergies as of 07/24/2018    Reactions   Bactrim Anaphylaxis   Bee Venom Hives   Chocolate Flavor Anaphylaxis   Cymbalta [duloxetine Hcl] Shortness Of Breath   Other Hives   Insect stings Wine spirit   Aspartame Other (See Comments)   Coconut Oil Other (See Comments)   Cranberry Extract Other (See Comments)   Sulfur Itching   Adhesive [tape]    Ciprofloxacin    Doxycycline    Possibly joint pain or itch   Erythromycin    Upset stomach   Hydrocodone-homatropine    Not true allergy. "irritated"   Levofloxacin    Weakness, myalgia   Lorazepam Other (See Comments)   Hallucination   Meloxicam Itching   Naproxen Sodium Itching   Omeprazole-sodium Bicarbonate    Chest pain-sore all over-HA   Ranitidine    Severe joint pain   Tetracyclines & Related    Tizanidine    "knocked her out"- had a rebound migraine   Triamcinolone    Etodolac Rash   Neosporin [neomycin-bacitracin Zn-polymyx] Rash      Medication List      albuterol 108 (90 Base) MCG/ACT inhaler Commonly known as:  PROVENTIL HFA;VENTOLIN HFA Inhale 2 puffs into the lungs every 4 (four) hours as needed.   amphetamine-dextroamphetamine 10 MG tablet Commonly known as:  ADDERALL Take 1 tablet (10 mg total) by mouth 3 (three) times daily.   atenolol-chlorthalidone 50-25 MG tablet Commonly known as:  TENORETIC Take 1 tablet by mouth  daily.   azelastine 0.1 % nasal spray Commonly known as:  ASTELIN Place 1 spray into both nostrils every evening. Use in each nostril as directed   busPIRone 7.5 MG tablet Commonly known as:  BUSPAR Take 7.5 mg by mouth 3 (three) times daily.   butalbital-acetaminophen-caffeine 50-325-40-30 MG capsule Commonly known as:  FIORICET WITH CODEINE Take 1 capsule by mouth every 4 (four) hours as needed.   cholecalciferol 1000 units tablet Commonly known as:  VITAMIN D Take by mouth Nightly.   cyclobenzaprine 10 MG tablet Commonly known as:  FLEXERIL Take 1 tablet (10 mg total) by mouth 2 (two) times daily as  needed for muscle spasms.   diazepam 10 MG tablet Commonly known as:  VALIUM Up to one pill po daily for anxiety or sleep   EPINEPHrine 0.3 mg/0.3 mL Soaj injection Commonly known as:  EPI-PEN Use as directed for severe allergic reaction   FLUoxetine 10 MG tablet Commonly known as:  PROZAC Take 10 mg by mouth daily.   fluticasone 110 MCG/ACT inhaler Commonly known as:  FLOVENT HFA Inhale 1 puff into the lungs 2 (two) times daily.   fluticasone 50 MCG/ACT nasal spray Commonly known as:  FLONASE Place 2 sprays into the nose daily.   levonorgestrel 20 MCG/24HR IUD Commonly known as:  MIRENA by Intrauterine route.   MAGNESIUM PO Take by mouth as needed.   pravastatin 20 MG tablet Commonly known as:  PRAVACHOL Take 20 mg by mouth daily.   QVAR REDIHALER 80 MCG/ACT inhaler Generic drug:  beclomethasone INHALE 2 PUFFS TWICE DAILY   rosuvastatin 10 MG tablet Commonly known as:  CRESTOR Take 10 mg by mouth.   triamcinolone 55 MCG/ACT Aero nasal inhaler Commonly known as:  NASACORT Use one spray in each nostril once daily.   VITAMIN B 12 PO Take by mouth as needed.       Past Medical History:  Diagnosis Date  . ADD (attention deficit disorder)   . Arthritis   . Asthma   . Chronic back pain   . Chronic neck pain   . Endometriosis   . Fibromyalgia   . GERD (gastroesophageal reflux disease)   . Hernia   . Hypertension   . IBS (irritable bowel syndrome)   . IC (interstitial cystitis)   . Multiple allergies   . OCD (obsessive compulsive disorder)   . Raynaud disease   . Vertigo     Past Surgical History:  Procedure Laterality Date  . BLADDER SURGERY    . CERVICAL FUSION    . CESAREAN SECTION    . HAND SURGERY    . LESION REMOVAL  08/23/2012   Procedure: LESION REMOVAL HAND;  Surgeon: Cammie Sickle., MD;  Location: [facility];  Service: Orthopedics;  Laterality: Left;  . ROTATOR CUFF REPAIR      Review of systems negative except as  noted in HPI / PMHx or noted below:  Review of Systems  Constitutional: Negative.   HENT: Negative.   Eyes: Negative.   Respiratory: Negative.   Cardiovascular: Negative.   Gastrointestinal: Negative.   Genitourinary: Negative.   Musculoskeletal: Negative.   Skin: Negative.   Neurological: Negative.   Endo/Heme/Allergies: Negative.   Psychiatric/Behavioral: Negative.      Objective:   Vitals:   07/24/18 1604  BP: 118/80  Pulse: 100  Resp: 20   Height: 5\' 3"  (160 cm)  Weight: 156 lb 6.4 oz (70.9 kg)   Physical Exam  HENT:  Head: Normocephalic.  Right Ear: Tympanic membrane, external ear and ear canal normal.  Left Ear: Tympanic membrane, external ear and ear canal normal.  Nose: Nose normal. No mucosal edema or rhinorrhea.  Mouth/Throat: Uvula is midline, oropharynx is clear and moist and mucous membranes are normal. No oropharyngeal exudate.  Eyes: Conjunctivae are normal.  Neck: Trachea normal. No tracheal tenderness present. No tracheal deviation present. No thyromegaly present.  Cardiovascular: Normal rate, regular rhythm, S1 normal, S2 normal and normal heart sounds.  No murmur heard. Pulmonary/Chest: Breath sounds normal. No stridor. No respiratory distress. She has no wheezes. She has no rales.  Musculoskeletal: She exhibits no edema.  Lymphadenopathy:       Head (right side): No tonsillar adenopathy present.       Head (left side): No tonsillar adenopathy present.    She has no cervical adenopathy.  Neurological: She is alert.  Skin: No rash noted. She is not diaphoretic. No erythema. Nails show no clubbing.    Diagnostics:    Spirometry was performed and demonstrated an FEV1 of 1.51 at 54 % of predicted.  The patient had an Asthma Control Test with the following results: ACT Total Score: 18.    Assessment and Plan:   1. Asthma, mild intermittent, well-controlled   2. Other allergic rhinitis   3. Allergic urticaria   4. LPRD (laryngopharyngeal  reflux disease)   5. Anticardiolipin antibody positive   6. Allergy to nonsteroidal anti-inflammatory drug (NSAID)     1. Continue action plan for asthma flare:   A. Qvar 80 Redihaler 2 inhalations twice a day with spacer  B. ProAir HFA 2 puffs every 4-6 hours if needed  2. Continue EpiPen, Benadryl, M.D./ER for allergic reaction  3. Continue treatment of allergic rhinitis:   A. Nasacort nasal spray - 1 sprays each nostril one time per day  B. Azelastine nasal spray - 1-2 sprays each nostril two time per day  4. Can continue over-the-counter antihistamine if needed  5. Continue daily 82 mcg aspirin use for general preventative health  6. Return to clinic in 6 months or earlier if problem.    Samantha [name] has had pretty good control of her airway disease on her current plan and she will remain on a combination of a nasal antihistamine and nasal steroid and use an action plan for an asthma flare.  As well, her urticaria appears to be doing relatively well and she has not had any bad allergic reactions in the past year.  I think with her anticardiolipin antibody positivity she should probably remain on a low-dose aspirin.  Obviously low-dose aspirin has never cause her problem in the past.  Certainly with her history of ibuprofen and Naprosyn and high-dose aspirin reactivity she should not be using nonsteroidal anti-inflammatory drugs.  I think her acetaminophen issue was probably tied up with zoster and she will need to work through whether or not she is allergic to acetaminophen by using a very low dose and building up over the course of a few days.  If there is any problems she will let me know and we will work through this issue in more detail.  She may need to rely on the use of a selective Cox 2 inhibitor if she cannot tolerate NSAIDs and acetaminophen.  Otherwise, I will see her back in this clinic in 6 months or earlier if there is a problem.  Allena Katz, MD Allergy / Immunology [place]

## 2018-07-24 NOTE — Patient Instructions (Addendum)
  1. Continue action plan for asthma flare:   A. Qvar 80 Redihaler 2 inhalations twice a day with spacer  B. ProAir HFA 2 puffs every 4-6 hours if needed  2. Continue EpiPen, Benadryl, M.D./ER for allergic reaction  3. Continue treatment of allergic rhinitis:   A. Nasacort nasal spray - 1 sprays each nostril one time per day  B. Azelastine nasal spray - 1-2 sprays each nostril two time per day  4. Can continue over-the-counter antihistamine if needed  5. Continue daily 82 mcg aspirin use for general preventative health  6. Return to clinic in 6 months or earlier if problem.

## 2018-07-25 ENCOUNTER — Encounter: Payer: Self-pay | Admitting: Allergy and Immunology

## 2018-07-27 ENCOUNTER — Other Ambulatory Visit: Payer: Self-pay | Admitting: *Deleted

## 2018-07-27 MED ORDER — EPINEPHRINE 0.3 MG/0.3ML IJ SOAJ: INTRAMUSCULAR | 1 refills | Status: DC

## 2018-07-27 MED ORDER — ALBUTEROL SULFATE HFA 108 (90 BASE) MCG/ACT IN AERS: 2.0000 | INHALATION_SPRAY | RESPIRATORY_TRACT | 2 refills | Status: DC | PRN

## 2018-07-27 MED ORDER — AZELASTINE HCL 0.1 % NA SOLN: 1.0000 | Freq: Every evening | NASAL | 5 refills | Status: DC

## 2018-07-28 ENCOUNTER — Other Ambulatory Visit: Payer: Self-pay

## 2018-07-28 ENCOUNTER — Ambulatory Visit
Admission: RE | Admit: 2018-07-28 | Discharge: 2018-07-28 | Disposition: A | Discharge status: Home / Self Care | Payer: 59 | Source: Ambulatory Visit | Attending: Neurology | Admitting: Neurology

## 2018-07-28 DIAGNOSIS — M5010 Cervical disc disorder with radiculopathy, unspecified cervical region: Secondary | ICD-10-CM | POA: Diagnosis not present

## 2018-07-28 DIAGNOSIS — M542 Cervicalgia: Secondary | ICD-10-CM

## 2018-07-30 ENCOUNTER — Telehealth: Payer: Self-pay | Admitting: Neurology

## 2018-07-30 ENCOUNTER — Telehealth: Payer: Self-pay

## 2018-07-30 DIAGNOSIS — M51379 Other intervertebral disc degeneration, lumbosacral region without mention of lumbar back pain or lower extremity pain: Secondary | ICD-10-CM

## 2018-07-30 DIAGNOSIS — M5010 Cervical disc disorder with radiculopathy, unspecified cervical region: Secondary | ICD-10-CM

## 2018-07-30 DIAGNOSIS — M5137 Other intervertebral disc degeneration, lumbosacral region: Secondary | ICD-10-CM

## 2018-07-30 MED ORDER — EPINEPHRINE 0.3 MG/0.3ML IJ SOAJ: INTRAMUSCULAR | 2 refills | Status: DC

## 2018-07-30 MED ORDER — METHOCARBAMOL 500 MG PO TABS: 500.0000 mg | ORAL_TABLET | Freq: Three times a day (TID) | ORAL | 5 refills | Status: DC

## 2018-07-30 NOTE — Telephone Encounter (Signed)
Patient is calling to see if we can do auvi-q for her epi pens.  Patient also tried to do acetaminophen again and she broke out in big nodules. She took about two doses of 500 mg.  Please Advise

## 2018-07-30 NOTE — Telephone Encounter (Signed)
Spoke to patient advised of change. Patient is allergic to both of these medications will send message to Dr Neldon Mc to advise

## 2018-07-30 NOTE — Telephone Encounter (Addendum)
I called pt. Advised I spoke with Dr. Felecia Shelling who recommended Robaxin 500mg  TID instead of flexeril. She is agreeable to this. I sent rx to pharmacy.   I went over MRI cervical/thoracic also. She is requesting referral to Dr. Trenton Gammon again at [facility] and spine to be re-evaluated. She is having increased pain. Having numb/ting in fingers and toes. She has decreased function of right arm and leg. Her leg drags as she walks per pt. Advised we will place this for her.  She has not gotten xrays done that Dr. Felecia Shelling ordered on 07/02/18. She will try to get this done as well.

## 2018-07-30 NOTE — Addendum Note (Signed)
Addended by: Hope Pigeon on: 07/30/2018 04:59 PM   Modules accepted: Orders

## 2018-07-30 NOTE — Telephone Encounter (Addendum)
Patient statescyclobenzaprine (FLEXERIL) 10 MG tablet is making her have heart palpitations and chest pains. She cannot take any high dose aspirin, Excedrin  because it triggers her asthma. She cannot take Tylenol because she breaks out in blisters and a rash. Is there anything she can take for pain, muscle spasms? Please call and discuss

## 2018-07-30 NOTE — Telephone Encounter (Signed)
Please have her try a selective Cox-2 inhibitor for pain relief. Her choices are Celebrex and Mobic. We can start with Mobic 7.5 mg 1-2 times per day if needed.

## 2018-07-30 NOTE — Telephone Encounter (Signed)
Spoke to patient advised we sent in [place] Q. Dr Elder Cyphers

## 2018-07-30 NOTE — Telephone Encounter (Signed)
Samantha [name]: [id number] (exp. 07/25/18 to 10/23/18) patient had Mri's at GI on 07/28/18.

## 2018-07-30 NOTE — Telephone Encounter (Signed)
Then she has a problem that will require more direction from a pain specialist. If she can not take Cox-1 editors or Cox 2 inhibitors or acetaminophen then she is going to require some form of prescription pain altering medication which I do not have the [place] license schedule to prescribe.

## 2018-07-31 NOTE — Telephone Encounter (Signed)
Attempted to call pt x 2 received a fax tone unable to leave message

## 2018-08-01 NOTE — Telephone Encounter (Signed)
Spoke to patient advised  she needs to go see a pain specialist for pain management per Dr Jacqualine Code. Patient verbalized understanding states she is seeing a neurologist and he has prescribed something new 2 days ago.

## 2018-08-08 ENCOUNTER — Ambulatory Visit (HOSPITAL_COMMUNITY): Payer: 59 | Admitting: Psychiatry

## 2018-08-13 NOTE — Telephone Encounter (Signed)
I called and spoke with pt. She states Robaxin did not help with pain/spasms. She is very sleepy/tired during the day and then unable to sleep at night. She cannot drive while taking medication. She is too sleepy. She would like to know what other medication Dr. Felecia Shelling would recommend.   She has appt scheduled with neurosurgery the middle of December. I recommended she get put on the cx list. She verbalized understanding but states she has to pre-plan her appt so her husband can bring her.

## 2018-08-13 NOTE — Telephone Encounter (Signed)
Patient states methocarbamol (ROBAXIN) 500 MG tablet keeps her awake all night and she is asleep all day. She has decreased medication and has not helped. Please call and discuss. I advised Dr. Felecia Shelling is out of the office and will send to his nurse.

## 2018-08-14 MED ORDER — METAXALONE 800 MG PO TABS: ORAL_TABLET | ORAL | 0 refills | Status: DC

## 2018-08-14 NOTE — Telephone Encounter (Signed)
Pt called back. I relayed recommendation below. She is agreeable to plan. Sent in new rx for Skelaxin for pt. She will call back if she has further questions or concerns.   She has noticed since being off robaxin that she has been sleeping well at night.

## 2018-08-14 NOTE — Telephone Encounter (Signed)
Spoke with Dr. Felecia Shelling- she can stop robaxin and we can try her on Skelaxin 800mg  tab up to three times daily #90, 0 refills

## 2018-08-14 NOTE — Telephone Encounter (Signed)
Called, LVM for pt to call 

## 2018-08-14 NOTE — Addendum Note (Signed)
Addended by: Rossie Muskrat L on: 08/14/2018 11:27 AM   Modules accepted: Orders

## 2018-08-20 ENCOUNTER — Telehealth: Payer: Self-pay | Admitting: Neurology

## 2018-08-20 DIAGNOSIS — M5416 Radiculopathy, lumbar region: Secondary | ICD-10-CM

## 2018-08-20 DIAGNOSIS — G894 Chronic pain syndrome: Secondary | ICD-10-CM

## 2018-08-20 DIAGNOSIS — M5010 Cervical disc disorder with radiculopathy, unspecified cervical region: Secondary | ICD-10-CM

## 2018-08-20 NOTE — Addendum Note (Signed)
Addended by: France Ravens I on: 08/20/2018 02:01 PM   Modules accepted: Orders

## 2018-08-20 NOTE — Telephone Encounter (Signed)
I have spoken with Samantha [name] and advised she stop Robaxin. She sts. she continues to have worsening neck/back pain. Doesn't want injections--does not feel they have helped. Sts. she is not able to tolerate other muscle relaxers either d/t side effects. She has been referred to Dr. Trenton Gammon to explore any surgical options that may be available to her, and sts. she has a pending appt.  Referral also placed to Dr. Maryjean Ka so that other less invasive  pain mx. options may be explored./fim

## 2018-08-20 NOTE — Telephone Encounter (Signed)
Pt has called re: her taking metaxalone (SKELAXIN) 800 MG tablet, pt states shortly after taking medication she had an asthma attack.  Please call

## 2018-08-21 ENCOUNTER — Other Ambulatory Visit: Payer: Self-pay | Admitting: Neurology

## 2018-08-23 ENCOUNTER — Ambulatory Visit
Admission: RE | Admit: 2018-08-23 | Discharge: 2018-08-23 | Disposition: A | Discharge status: Home / Self Care | Payer: Managed Care, Other (non HMO) | Source: Ambulatory Visit | Attending: Neurology | Admitting: Neurology

## 2018-08-23 DIAGNOSIS — M542 Cervicalgia: Secondary | ICD-10-CM

## 2018-08-23 DIAGNOSIS — M5010 Cervical disc disorder with radiculopathy, unspecified cervical region: Secondary | ICD-10-CM

## 2018-08-24 ENCOUNTER — Telehealth: Payer: Self-pay

## 2018-08-24 NOTE — Telephone Encounter (Signed)
I called Samantha [name] and discussed her xray results. Samantha [name] reports that she is already aware of these results because she saw Dr. Trenton Gammon yesterday and will be seeing Dr. Maryjean Ka in pain management soon. Samantha [name] verbalized understanding of results. Samantha [name] had no questions at this time but was encouraged to call back if questions arise.

## 2018-08-24 NOTE — Telephone Encounter (Signed)
-----   Message from Britt Bottom, MD sent at 08/24/2018 12:36 PM EST ----- Please let her know that the x-ray of her cervical spine with flexion and extension shows the postoperative changes but the fusion appears to be good.  There is no sliding of the vertebral bodies when she moves her neck.  She should continue to follow-up with neurosurgery.

## 2018-08-27 ENCOUNTER — Encounter: Payer: Self-pay | Admitting: Gastroenterology

## 2018-09-03 ENCOUNTER — Other Ambulatory Visit: Payer: Self-pay | Admitting: Allergy

## 2018-09-03 MED ORDER — BECLOMETHASONE DIPROP HFA 80 MCG/ACT IN AERB: 2.0000 | INHALATION_SPRAY | Freq: Two times a day (BID) | RESPIRATORY_TRACT | 2 refills | Status: DC

## 2018-09-06 ENCOUNTER — Ambulatory Visit: Payer: Managed Care, Other (non HMO) | Admitting: Neurology

## 2018-09-06 ENCOUNTER — Encounter

## 2018-09-19 ENCOUNTER — Other Ambulatory Visit: Payer: Self-pay | Admitting: Neurology

## 2018-09-21 ENCOUNTER — Telehealth: Payer: Self-pay | Admitting: *Deleted

## 2018-09-21 NOTE — Telephone Encounter (Signed)
Advised front office to contact patient to make an appointment to address concerns.

## 2018-09-21 NOTE — Telephone Encounter (Signed)
Patient called stating she has a question for Dr Neldon Mc, patient would like to know if Dr Neldon Mc can do a test for medications she will know what she can and can't take. Patient also states she is breaking out in hives when certain parts of her skin gets cold, patient states she has to wear layers of clothes. Please advise [phone]

## 2018-09-21 NOTE — Telephone Encounter (Signed)
I was advise by at nurse to schedule an appointment  Need to know if patient needs a office visit to discuss issues or if an appointment needs to be made for testing.  Patient was just seen in august and has an upcoming appointment in may for office visit. Please advise

## 2018-09-24 ENCOUNTER — Telehealth: Payer: Self-pay | Admitting: Allergy and Immunology

## 2018-09-24 NOTE — Telephone Encounter (Signed)
Patient was advise to take antihistamine, she stated she has many concerns regarding many medication and doesn't want to specify which medication it is cause she states it is many, so wanted to know if there is an underlying cause that may be having her react to so many medication ie: abx, pain medication (Tylenol). Advise patient she had labs drawn back 2017 and may have to have labs redrawn to make sure it may not be something new. Patient is unsure of what to do regarding this.

## 2018-09-24 NOTE — Telephone Encounter (Signed)
Please inform patient that Samantha [name] can add OTC cetirizine 10 mg tablet 1 or 2 times per day to see if this helps prevent cold-induced hives.  Samantha [name] can see [place] in clinic if this is ineffective.  Concerning her medication allergy which medications does Samantha [name] want to investigate?

## 2018-09-24 NOTE — Telephone Encounter (Signed)
Returned call. Called patient back left voicemail to return call.

## 2018-09-24 NOTE — Telephone Encounter (Signed)
Lmom for patient to call back regarding her concern

## 2018-09-24 NOTE — Telephone Encounter (Signed)
Pt was returning a phone call [phone].

## 2018-09-24 NOTE — Telephone Encounter (Signed)
See previous telephone note. 

## 2018-09-26 ENCOUNTER — Ambulatory Visit (INDEPENDENT_AMBULATORY_CARE_PROVIDER_SITE_OTHER): Payer: 59 | Admitting: Psychology

## 2018-09-26 DIAGNOSIS — F429 Obsessive-compulsive disorder, unspecified: Secondary | ICD-10-CM | POA: Diagnosis not present

## 2018-09-26 DIAGNOSIS — F4310 Post-traumatic stress disorder, unspecified: Secondary | ICD-10-CM

## 2018-10-02 ENCOUNTER — Ambulatory Visit: Payer: Self-pay | Admitting: Gastroenterology

## 2018-10-10 ENCOUNTER — Ambulatory Visit (INDEPENDENT_AMBULATORY_CARE_PROVIDER_SITE_OTHER): Payer: 59 | Admitting: Psychology

## 2018-10-10 DIAGNOSIS — F429 Obsessive-compulsive disorder, unspecified: Secondary | ICD-10-CM

## 2018-10-10 DIAGNOSIS — F4310 Post-traumatic stress disorder, unspecified: Secondary | ICD-10-CM

## 2018-10-22 ENCOUNTER — Ambulatory Visit: Payer: Managed Care, Other (non HMO) | Admitting: Neurology

## 2018-10-22 ENCOUNTER — Other Ambulatory Visit: Payer: Self-pay | Admitting: Neurology

## 2018-10-26 ENCOUNTER — Ambulatory Visit (INDEPENDENT_AMBULATORY_CARE_PROVIDER_SITE_OTHER): Payer: 59 | Admitting: Psychology

## 2018-10-26 DIAGNOSIS — F429 Obsessive-compulsive disorder, unspecified: Secondary | ICD-10-CM

## 2018-10-26 DIAGNOSIS — F4310 Post-traumatic stress disorder, unspecified: Secondary | ICD-10-CM

## 2018-11-02 ENCOUNTER — Ambulatory Visit (HOSPITAL_COMMUNITY): Payer: Self-pay | Admitting: Psychiatry

## 2018-11-06 DIAGNOSIS — R3129 Other microscopic hematuria: Secondary | ICD-10-CM | POA: Insufficient documentation

## 2018-11-07 ENCOUNTER — Encounter (HOSPITAL_COMMUNITY): Payer: Self-pay

## 2018-11-07 ENCOUNTER — Ambulatory Visit (HOSPITAL_COMMUNITY): Payer: 59 | Admitting: Psychiatry

## 2018-11-08 ENCOUNTER — Ambulatory Visit (HOSPITAL_COMMUNITY): Payer: 59 | Admitting: Psychiatry

## 2018-11-09 ENCOUNTER — Ambulatory Visit (INDEPENDENT_AMBULATORY_CARE_PROVIDER_SITE_OTHER): Payer: 59 | Admitting: Psychology

## 2018-11-09 DIAGNOSIS — F429 Obsessive-compulsive disorder, unspecified: Secondary | ICD-10-CM

## 2018-11-09 DIAGNOSIS — F4310 Post-traumatic stress disorder, unspecified: Secondary | ICD-10-CM | POA: Diagnosis not present

## 2018-11-13 DIAGNOSIS — R918 Other nonspecific abnormal finding of lung field: Secondary | ICD-10-CM | POA: Insufficient documentation

## 2018-11-21 ENCOUNTER — Ambulatory Visit (INDEPENDENT_AMBULATORY_CARE_PROVIDER_SITE_OTHER): Payer: 59 | Admitting: Psychiatry

## 2018-11-21 ENCOUNTER — Encounter (HOSPITAL_COMMUNITY): Payer: Self-pay | Admitting: Psychiatry

## 2018-11-21 VITALS — BP 124/80 | Ht 63.5 in | Wt 166.0 lb

## 2018-11-21 DIAGNOSIS — F4312 Post-traumatic stress disorder, chronic: Secondary | ICD-10-CM

## 2018-11-21 DIAGNOSIS — F4001 Agoraphobia with panic disorder: Secondary | ICD-10-CM | POA: Insufficient documentation

## 2018-11-21 DIAGNOSIS — F908 Attention-deficit hyperactivity disorder, other type: Secondary | ICD-10-CM

## 2018-11-21 DIAGNOSIS — F410 Panic disorder [episodic paroxysmal anxiety] without agoraphobia: Secondary | ICD-10-CM | POA: Diagnosis not present

## 2018-11-21 MED ORDER — AMPHETAMINE-DEXTROAMPHETAMINE 10 MG PO TABS: 10.0000 mg | ORAL_TABLET | Freq: Three times a day (TID) | ORAL | 0 refills | Status: DC

## 2018-11-21 MED ORDER — SUVOREXANT 10 MG PO TABS: 10.0000 mg | ORAL_TABLET | Freq: Every evening | ORAL | 0 refills | Status: AC | PRN

## 2018-11-21 MED ORDER — VORTIOXETINE HBR 5 MG PO TABS: 5.0000 mg | ORAL_TABLET | Freq: Every day | ORAL | 0 refills | Status: AC

## 2018-11-21 MED ORDER — OXAZEPAM 15 MG PO CAPS: 15.0000 mg | ORAL_CAPSULE | Freq: Every day | ORAL | 0 refills | Status: AC | PRN
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
## 2018-11-23 ENCOUNTER — Ambulatory Visit: Payer: 59 | Admitting: Psychology

## 2018-11-28 NOTE — Progress Notes (Deleted)
PATIENT: Samantha [name] DOB: [birth year]  REASON FOR VISIT: follow up HISTORY FROM: patient  No chief complaint on file.    HISTORY OF PRESENT ILLNESS: Today 11/28/18 Samantha [name] is a 48 y.o. female here today for follow up.     HISTORY: (copied from Dr Garth Bigness note on 07/02/2018) [name] is a 48 y.o. woman with neck pain, arm pain and lower back pain and excessive sleepiness.    Update 07/02/2018: She is having more excessive daytime sleepiness and attention deficit.   She had been on Adderall 20 mg tid (written by Jesse Fall then [facility], PA-C over the past year).  She reports that her current PCP is not writing stimulants so she is stretching ler last script out with 10 mg 1-2 times a day.     She is prescribed on Diazepam 10 mg po tid but takes it just once a day.   In the past, she was on opiates but has not been on any opiates for a while.  She also had been on Fioricet for migraine headaches in the past.  Earlier this year, she ws switched from Paxil to Zoloft but had eye pain and headaches.   She is now off of the antidepressant.     Currently, mood is doing ok.   She is taking valium prn (but usually once a day).      She gets a lot of neck spasms.   Flexeril has helped her pain as much as opiates did.   She notes reduced ROM in her neck.   She has bowel incontinence x a couple years but it is worse this year.     She had C6C7 fusion by Dr. Annette Stable June 2018 (previously had C5C6 surgery).        Update 06/12/2017:  Since the last visit, she has had cervical fusion at C6-C7 (Dr. Annette Stable) and previously, she had fusion at C5-C6.   She continues to report neck pain and arm pain but is able to use her arms better since the surgery.        Unfortunately, she is now noting more pain in the lower back especially in the coccyx region. She also has more leg pain, right worse than left.   She notes the right leg feels weak when she stands up and she notes  numbness going into her legs, right worse than left.     She is on MSIR 15 mg po, usually 1/2 bid up to bid.   She is also on diazepam 10 mg po up to tid (qd or bid most days) and Flexeril prn.   She takes Xanax at bedtime 1mg .  She tolerates the medications well and has not escalated.    She notes some depression despite zoloft.   Cymbalta was poorly tolerated in the past.    ____________________________________ From 02/07/2017:  Neck/arm Pain:   She reports pain in her right neck radiating to the back of the head and also in the upper back on the right.   She reports having a NCV/EMG of the right arm and they reportedly showed (study at Dr. Bertis Ruddy office).   No nerve damage and she says they told her it was probably coming more from her neck.    She has had a C5-C6 fusion in 1999. MRI 06/13/2016 showed multilevel degenerative changes. At C2-C3 there is moderately severe right foraminal narrowing at could lead to right C3 nerve root compression. He 3 C4 there are  degenerative changes at multiple upon both the C4 nerve root prior ACDF.  At C6-C7 there is mild spinal stenosis.   She has seen Dr. Annette Stable.     TPI and/or shoulder injections have not helped much in the past.    PT did not help in the past.    Etodolac caused swelling so they stopped.     Low back, mid back pain and leg pain:  She feels her lower back pain is doing worse. The worst pain is in the sacral coccyx region    Pain is mildly better with heat.   PT did not help in the past.  Some TPIs helped but the past few have not lasted long.     MRI report  from 08/09/2011 showed L4-L5 and L5-S1 degenerative disc disease but no definite nerve root compression.  She reports another MRI 2015 with Dr. Patrice Paradise (ortho spine) .     In the past, she has not been able to tolerate gabapentin or Cymbalta.   PT has not helped.     Pain Med's:   MSIR 15 mg helps slightly but she tries to < one/day.  It worsens constipation    Oxycodone caused GI upset  and did not help.  Fentanyl adhesive was not tolerated.     She is also on Flexeril but it makes her sleepy.   She takes valium once daily (Dr. Lenna Gilford prescribes).     Daytime sleepiness:    This is stable.    She reports that the excessive daytime sleepiness is better with Adderall.   She is sleepy every day and takes naps without benefit..   She is noting some sleep onset insomnia despite valium which was helping more in the past.  Flexeril helps her stay asleep.     PSG 06/27/2013 showed no significant OSA (AHI = 1).  She had moderately severe snoring. There is no significant periodic limb movements of sleep. Sleep efficiency was 68% and she had a delay sleep latency. She had normal amount of slow wave N3 sleep but no REM sleep.   MSLT 11/29/2013 had mean sleep latency of 9.6 minutes (borderline).   There was no sleep onset REM sleep. 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       Other:   She has a positive anti-cardiolipin IgG but other tests (ANA, RF, Lup Anticoag are negative)  REVIEW OF SYSTEMS: Out of a complete 14 system review of symptoms, the patient complains only of the following symptoms, and all other reviewed systems are negative.  ALLERGIES: Allergies  Allergen Reactions  . Bactrim Anaphylaxis  . Bee Venom Hives  . Chocolate Flavor Anaphylaxis  . Cymbalta [Duloxetine Hcl] Shortness Of Breath  . Other Hives    Insect stings Wine spirit  . Aspartame Other (See Comments)  . Coconut Oil Other (See Comments)  . Cranberry Extract Other (See Comments)  . Sulfur Itching  . Adhesive [Tape]   . Ciprofloxacin   . Doxycycline     Possibly joint pain or itch   . Erythromycin     Upset stomach  . Hydrocodone-Homatropine     Not true allergy. "irritated"  . Levofloxacin     Weakness, myalgia  . Lorazepam Other (See Comments)    Hallucination  . Meloxicam Itching  . Naproxen Sodium Itching  . Omeprazole-Sodium Bicarbonate     Chest pain-sore all  over-HA  . Ranitidine     Severe joint pain  . Robaxin [Methocarbamol]  Made her very tired and unable to sleep at night  . Tetracyclines & Related   . Tizanidine     "knocked her out"- had a rebound migraine  . Triamcinolone   . Etodolac Rash  . Neosporin [Neomycin-Bacitracin Zn-Polymyx] Rash    HOME MEDICATIONS: Outpatient Medications Prior to Visit  Medication Sig Dispense Refill  . albuterol (PROVENTIL HFA;VENTOLIN HFA) 108 (90 Base) MCG/ACT inhaler Inhale 2 puffs into the lungs every 4 (four) hours as needed. 1 Inhaler 2  . amphetamine-dextroamphetamine (ADDERALL) 10 MG tablet Take 1 tablet (10 mg total) by mouth 3 (three) times daily. 90 tablet 0  . atenolol-chlorthalidone (TENORETIC) 50-25 MG tablet Take 1 tablet by mouth daily.    Marland Kitchen azelastine (ASTELIN) 0.1 % nasal spray Place 1 spray into both nostrils every evening. 30 mL 5  . beclomethasone (QVAR REDIHALER) 80 MCG/ACT inhaler Inhale 2 puffs into the lungs 2 (two) times daily. 10.6 g 2  . butalbital-acetaminophen-caffeine (FIORICET WITH CODEINE) 50-325-40-30 MG per capsule Take 1 capsule by mouth every 4 (four) hours as needed.    . cholecalciferol (VITAMIN D) 1000 UNITS tablet Take by mouth Nightly.    . Cyanocobalamin (VITAMIN B 12 PO) Take by mouth as needed.    Marland Kitchen EPINEPHrine (AUVI-Q) 0.3 mg/0.3 mL IJ SOAJ injection Use as directed for severe allergic reaction 2 Device 2  . fluticasone (FLONASE) 50 MCG/ACT nasal spray Place 2 sprays into the nose daily.    . fluticasone (FLOVENT HFA) 110 MCG/ACT inhaler Inhale 1 puff into the lungs 2 (two) times daily. 1 Inhaler 1  . levonorgestrel (MIRENA) 20 MCG/24HR IUD by Intrauterine route.    Marland Kitchen MAGNESIUM PO Take by mouth as needed.    . metaxalone (SKELAXIN) 800 MG tablet Take 1 tablet up to three times a day as needed 90 tablet 0  . oxazepam (SERAX) 15 MG capsule Take 1 capsule (15 mg total) by mouth daily as needed for up to 30 days for sleep or anxiety. 30 capsule 0  .  pravastatin (PRAVACHOL) 20 MG tablet Take 20 mg by mouth daily.    . rosuvastatin (CRESTOR) 10 MG tablet Take 10 mg by mouth.    . Suvorexant (BELSOMRA) 10 MG TABS Take 10 mg by mouth at bedtime as needed for up to 30 days. 30 tablet 0  . triamcinolone (NASACORT) 55 MCG/ACT AERO nasal inhaler Use one spray in each nostril once daily.    Marland Kitchen vortioxetine HBr (TRINTELLIX) 5 MG TABS tablet Take 1 tablet (5 mg total) by mouth daily for 30 days. 30 tablet 0   No facility-administered medications prior to visit.     PAST MEDICAL HISTORY: Past Medical History:  Diagnosis Date  . ADD (attention deficit disorder)   . Arthritis   . Asthma   . Chronic back pain   . Chronic neck pain   . Endometriosis   . Fibromyalgia   . GERD (gastroesophageal reflux disease)   . Hernia   . Hypertension   . IBS (irritable bowel syndrome)   . IC (interstitial cystitis)   . Multiple allergies   . OCD (obsessive compulsive disorder)   . Raynaud disease   . Vertigo     PAST SURGICAL HISTORY: Past Surgical History:  Procedure Laterality Date  . BLADDER SURGERY    . CERVICAL FUSION    . CESAREAN SECTION    . HAND SURGERY    . LESION REMOVAL  08/23/2012   Procedure: LESION REMOVAL HAND;  Surgeon: Youlanda Mighty  Luisa Dago., MD;  Location: [place];  Service: Orthopedics;  Laterality: Left;  . ROTATOR CUFF REPAIR      FAMILY HISTORY: Family History  Problem Relation Age of Onset  . ADD / ADHD Brother     SOCIAL HISTORY: Social History   Socioeconomic History  . Marital status: Married    Spouse name: Not on file  . Number of children: Not on file  . Years of education: Not on file  . Highest education level: Not on file  Occupational History  . Not on file  Social Needs  . Financial resource strain: Not on file  . Food insecurity:    Worry: Not on file    Inability: Not on file  . Transportation needs:    Medical: Not on file    Non-medical: Not on file  Tobacco Use  . Smoking  status: Never Smoker  . Smokeless tobacco: Never Used  Substance and Sexual Activity  . Alcohol use: No  . Drug use: No  . Sexual activity: Yes    Birth control/protection: I.U.D.  Lifestyle  . Physical activity:    Days per week: Not on file    Minutes per session: Not on file  . Stress: Not on file  Relationships  . Social connections:    Talks on phone: Not on file    Gets together: Not on file    Attends religious service: Not on file    Active member of club or organization: Not on file    Attends meetings of clubs or organizations: Not on file    Relationship status: Not on file  . Intimate partner violence:    Fear of current or ex partner: Not on file    Emotionally abused: Not on file    Physically abused: Not on file    Forced sexual activity: Not on file  Other Topics Concern  . Not on file  Social History Narrative  . Not on file      PHYSICAL EXAM  There were no vitals filed for this visit. There is no height or weight on file to calculate BMI.  Generalized: Well developed, in no acute distress  Cardiology: normal rate and rhythm, no murmur noted Neurological examination  Mentation: Alert oriented to time, place, history taking. Follows all commands speech and language fluent Cranial nerve II-XII: Pupils were equal round reactive to light. Extraocular movements were full, visual field were full on confrontational test. Facial sensation and strength were normal. Uvula tongue midline. Head turning and shoulder shrug  were normal and symmetric. Motor: The motor testing reveals 5 over 5 strength of all 4 extremities. Good symmetric motor tone is noted throughout.  Sensory: Sensory testing is intact to soft touch on all 4 extremities. No evidence of extinction is noted.  Coordination: Cerebellar testing reveals good finger-nose-finger and heel-to-shin bilaterally.  Gait and station: Gait is normal. Tandem gait is normal. Romberg is negative. No drift is seen.   Reflexes: Deep tendon reflexes are symmetric and normal bilaterally.   DIAGNOSTIC DATA (LABS, IMAGING, TESTING) - I reviewed patient records, labs, notes, testing and imaging myself where available.  No flowsheet data found.   Lab Results  Component Value Date   WBC 13.0 (H) 11/25/2011   HGB 12.2 08/23/2012   HCT 36.0 08/23/2012   MCV 87.8 11/25/2011   PLT 261 11/25/2011      Component Value Date/Time   NA 139 08/23/2012 1023   K 3.5 08/23/2012 1023  CL 100 08/23/2012 1023   CO2 29 11/25/2011 0950   GLUCOSE 86 08/23/2012 1023   BUN 16 08/23/2012 1023   CREATININE 0.70 08/23/2012 1023   CALCIUM 10.0 11/25/2011 0950   PROT 7.5 11/25/2011 0950   ALBUMIN 4.1 11/25/2011 0950   AST 17 11/25/2011 0950   ALT 13 11/25/2011 0950   ALKPHOS 61 11/25/2011 0950   BILITOT 0.2 (L) 11/25/2011 0950   GFRNONAA >90 11/25/2011 0950   GFRAA >90 11/25/2011 0950   No results found for: CHOL, HDL, LDLCALC, LDLDIRECT, TRIG, CHOLHDL No results found for: HGBA1C No results found for: VITAMINB12 No results found for: TSH     ASSESSMENT AND PLAN 48 y.o. year old female  has a past medical history of ADD (attention deficit disorder), Arthritis, Asthma, Chronic back pain, Chronic neck pain, Endometriosis, Fibromyalgia, GERD (gastroesophageal reflux disease), Hernia, Hypertension, IBS (irritable bowel syndrome), IC (interstitial cystitis), Multiple allergies, OCD (obsessive compulsive disorder), Raynaud disease, and Vertigo. here with ***    ICD-10-CM   1. 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    No orders of the defined types were placed in this encounter.     I spent 15 minutes with the patient. 50% of this time was spent counseling and educating patient on plan of care and medications.    Debbora Presto, FNP-C 11/28/2018, 3:22 PM [facility]  [address] [place], [place] [address] [phone]

## 2018-12-04 ENCOUNTER — Ambulatory Visit: Payer: Managed Care, Other (non HMO) | Admitting: Family Medicine

## 2018-12-04 ENCOUNTER — Ambulatory Visit: Payer: Managed Care, Other (non HMO) | Admitting: Neurology

## 2018-12-07 ENCOUNTER — Ambulatory Visit (INDEPENDENT_AMBULATORY_CARE_PROVIDER_SITE_OTHER): Payer: 59 | Admitting: Psychology

## 2018-12-07 DIAGNOSIS — F429 Obsessive-compulsive disorder, unspecified: Secondary | ICD-10-CM

## 2018-12-07 DIAGNOSIS — F4310 Post-traumatic stress disorder, unspecified: Secondary | ICD-10-CM

## 2018-12-21 ENCOUNTER — Ambulatory Visit: Payer: 59 | Admitting: Psychology

## 2018-12-24 ENCOUNTER — Encounter (HOSPITAL_COMMUNITY): Payer: Self-pay | Admitting: Psychiatry

## 2018-12-24 ENCOUNTER — Ambulatory Visit (INDEPENDENT_AMBULATORY_CARE_PROVIDER_SITE_OTHER): Payer: 59 | Admitting: Psychiatry

## 2018-12-24 ENCOUNTER — Other Ambulatory Visit: Payer: Self-pay

## 2018-12-24 DIAGNOSIS — F908 Attention-deficit hyperactivity disorder, other type: Secondary | ICD-10-CM

## 2018-12-24 DIAGNOSIS — F410 Panic disorder [episodic paroxysmal anxiety] without agoraphobia: Secondary | ICD-10-CM

## 2018-12-24 DIAGNOSIS — F4312 Post-traumatic stress disorder, chronic: Secondary | ICD-10-CM

## 2018-12-24 MED ORDER — AMPHETAMINE-DEXTROAMPHETAMINE 10 MG PO TABS: 10.0000 mg | ORAL_TABLET | Freq: Three times a day (TID) | ORAL | 0 refills | Status: DC

## 2018-12-24 MED ORDER — VORTIOXETINE HBR 10 MG PO TABS: 10.0000 mg | ORAL_TABLET | Freq: Every day | ORAL | 1 refills | Status: DC

## 2018-12-24 MED ORDER — ALPRAZOLAM 1 MG PO TABS: 1.0000 mg | ORAL_TABLET | Freq: Two times a day (BID) | ORAL | 1 refills | Status: AC | PRN
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
## 2018-12-24 NOTE — Progress Notes (Deleted)
BH MD/PA/NP OP Progress Note  12/24/2018 4:29 PM Ragan Duhon  MRN:  [id number]  Chief Complaint:  HPI: *** Visit Diagnosis:    ICD-10-CM   1. Panic disorder F41.0   2. Chronic post-traumatic stress disorder (PTSD) F43.12   3. 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) F90.8     Past Psychiatric History: ***  Past Medical History:  Past Medical History:  Diagnosis Date  . ADD (attention deficit disorder)   . Arthritis   . Asthma   . Chronic back pain   . Chronic neck pain   . Endometriosis   . Fibromyalgia   . GERD (gastroesophageal reflux disease)   . Hernia   . Hypertension   . IBS (irritable bowel syndrome)   . IC (interstitial cystitis)   . Multiple allergies   . OCD (obsessive compulsive disorder)   . Raynaud disease   . Vertigo     Past Surgical History:  Procedure Laterality Date  . BLADDER SURGERY    . CERVICAL FUSION    . CESAREAN SECTION    . HAND SURGERY    . LESION REMOVAL  08/23/2012   Procedure: LESION REMOVAL HAND;  Surgeon: Cammie Sickle., MD;  Location: [facility];  Service: Orthopedics;  Laterality: Left;  . ROTATOR CUFF REPAIR      Family Psychiatric History: ***  Family History:  Family History  Problem Relation Age of Onset  . ADD / ADHD Brother     Social History:  Social History   Socioeconomic History  . Marital status: Married    Spouse name: Not on file  . Number of children: Not on file  . Years of education: Not on file  . Highest education level: Not on file  Occupational History  . Not on file  Social Needs  . Financial resource strain: Not on file  . Food insecurity:    Worry: Not on file    Inability: Not on file  . Transportation needs:    Medical: Not on file    Non-medical: Not on file  Tobacco Use  . Smoking status: Never Smoker  . Smokeless tobacco: Never Used  Substance and Sexual Activity  . Alcohol use: No  . Drug use: No  . Sexual activity: Yes   Birth control/protection: I.U.D.  Lifestyle  . Physical activity:    Days per week: Not on file    Minutes per session: Not on file  . Stress: Not on file  Relationships  . Social connections:    Talks on phone: Not on file    Gets together: Not on file    Attends religious service: Not on file    Active member of club or organization: Not on file    Attends meetings of clubs or organizations: Not on file    Relationship status: Not on file  Other Topics Concern  . Not on file  Social History Narrative  . Not on file    Allergies:  Allergies  Allergen Reactions  . Bactrim Anaphylaxis  . Bee Venom Hives  . Chocolate Flavor Anaphylaxis  . Cymbalta [Duloxetine Hcl] Shortness Of Breath  . Other Hives    Insect stings Wine spirit  . Aspartame Other (See Comments)  . Coconut Oil Other (See Comments)  . Cranberry Extract Other (See Comments)  . Sulfur Itching  . Adhesive [Tape]   . Ciprofloxacin   . Doxycycline     Possibly joint pain or itch   .  Erythromycin     Upset stomach  . Hydrocodone-Homatropine     Not true allergy. "irritated"  . Levofloxacin     Weakness, myalgia  . Lorazepam Other (See Comments)    Hallucination  . Meloxicam Itching  . Naproxen Sodium Itching  . Omeprazole-Sodium Bicarbonate     Chest pain-sore all over-HA  . Ranitidine     Severe joint pain  . Robaxin [Methocarbamol]     Made her very tired and unable to sleep at night  . Tetracyclines & Related   . Tizanidine     "knocked her out"- had a rebound migraine  . Triamcinolone   . Etodolac Rash  . Neosporin [Neomycin-Bacitracin Zn-Polymyx] Rash    Metabolic Disorder Labs: No results found for: HGBA1C, MPG No results found for: PROLACTIN No results found for: CHOL, TRIG, HDL, CHOLHDL, VLDL, LDLCALC No results found for: TSH  Therapeutic Level Labs: No results found for: LITHIUM No results found for: VALPROATE No components found for:  CBMZ  Current Medications: Current  Outpatient Medications  Medication Sig Dispense Refill  . albuterol (PROVENTIL HFA;VENTOLIN HFA) 108 (90 Base) MCG/ACT inhaler Inhale 2 puffs into the lungs every 4 (four) hours as needed. 1 Inhaler 2  . ALPRAZolam (XANAX) 1 MG tablet Take 1 tablet (1 mg total) by mouth 2 (two) times daily as needed for anxiety or sleep. 60 tablet 1  . amphetamine-dextroamphetamine (ADDERALL) 10 MG tablet Take 1 tablet (10 mg total) by mouth 3 (three) times daily. 90 tablet 0  . atenolol-chlorthalidone (TENORETIC) 50-25 MG tablet Take 1 tablet by mouth daily.    Marland Kitchen azelastine (ASTELIN) 0.1 % nasal spray Place 1 spray into both nostrils every evening. 30 mL 5  . beclomethasone (QVAR REDIHALER) 80 MCG/ACT inhaler Inhale 2 puffs into the lungs 2 (two) times daily. 10.6 g 2  . butalbital-acetaminophen-caffeine (FIORICET WITH CODEINE) 50-325-40-30 MG per capsule Take 1 capsule by mouth every 4 (four) hours as needed.    . cholecalciferol (VITAMIN D) 1000 UNITS tablet Take by mouth Nightly.    . Cyanocobalamin (VITAMIN B 12 PO) Take by mouth as needed.    Marland Kitchen EPINEPHrine (AUVI-Q) 0.3 mg/0.3 mL IJ SOAJ injection Use as directed for severe allergic reaction 2 Device 2  . fluticasone (FLONASE) 50 MCG/ACT nasal spray Place 2 sprays into the nose daily.    . fluticasone (FLOVENT HFA) 110 MCG/ACT inhaler Inhale 1 puff into the lungs 2 (two) times daily. 1 Inhaler 1  . levonorgestrel (MIRENA) 20 MCG/24HR IUD by Intrauterine route.    Marland Kitchen MAGNESIUM PO Take by mouth as needed.    . metaxalone (SKELAXIN) 800 MG tablet Take 1 tablet up to three times a day as needed 90 tablet 0  . pravastatin (PRAVACHOL) 20 MG tablet Take 20 mg by mouth daily.    . rosuvastatin (CRESTOR) 10 MG tablet Take 10 mg by mouth.    . triamcinolone (NASACORT) 55 MCG/ACT AERO nasal inhaler Use one spray in each nostril once daily.    Marland Kitchen vortioxetine HBr (TRINTELLIX) 10 MG TABS tablet Take 1 tablet (10 mg total) by mouth daily. 30 tablet 1   No current  facility-administered medications for this visit.      Musculoskeletal: Strength & Muscle Tone: {desc; muscle tone:32375} Gait & Station: {PE GAIT ED WUJW:11914} Patient leans: {Patient Leans:21022755}  Psychiatric Specialty Exam: ROS  There were no vitals taken for this visit.There is no height or weight on file to calculate BMI.  General Appearance: {Appearance:22683}  Eye Contact:  {BHH EYE CONTACT:22684}  Speech:  {Speech:22685}  Volume:  {Volume (PAA):22686}  Mood:  {BHH MOOD:22306}  Affect:  {Affect (PAA):22687}  Thought Process:  {Thought Process (PAA):22688}  Orientation:  {BHH ORIENTATION (PAA):22689}  Thought Content: {Thought Content:22690}   Suicidal Thoughts:  {ST/HT (PAA):22692}  Homicidal Thoughts:  {ST/HT (PAA):22692}  Memory:  {BHH MEMORY:22881}  Judgement:  {Judgement (PAA):22694}  Insight:  {Insight (PAA):22695}  Psychomotor Activity:  {Psychomotor (PAA):22696}  Concentration:  {Concentration:21399}  Recall:  {BHH GOOD/FAIR/POOR:22877}  Fund of Knowledge: {BHH GOOD/FAIR/POOR:22877}  Language: {BHH GOOD/FAIR/POOR:22877}  Akathisia:  {BHH YES OR NO:22294}  Handed:  {Handed:22697}  AIMS (if indicated): {Desc; done/not:10129}  Assets:  {Assets (PAA):22698}  ADL's:  {BHH JSE'G:31517}  Cognition: {chl bhh cognition:304700322}  Sleep:  {BHH GOOD/FAIR/POOR:22877}   Screenings: PHQ2-9     Office Visit from 07/17/2012 in [facility] for Infectious Disease  PHQ-2 Total Score  0       Assessment and Plan: ***   Stephanie Acre, MD 12/24/2018, 4:29 PM

## 2018-12-27 IMAGING — CR DG CERVICAL SPINE WITH FLEX & EXTEND
8 series · 8 of 8 positions shown · non-contrast
Comparison: Cervical MRI July 28, 2018; cervical spine
radiograph April 18, 2017

CLINICAL DATA: Chronic cervicalgia.  Postoperative fixation

EXAM:
CERVICAL SPINE COMPLETE WITH FLEXION AND EXTENSION VIEWS

[w cervical spine lat]
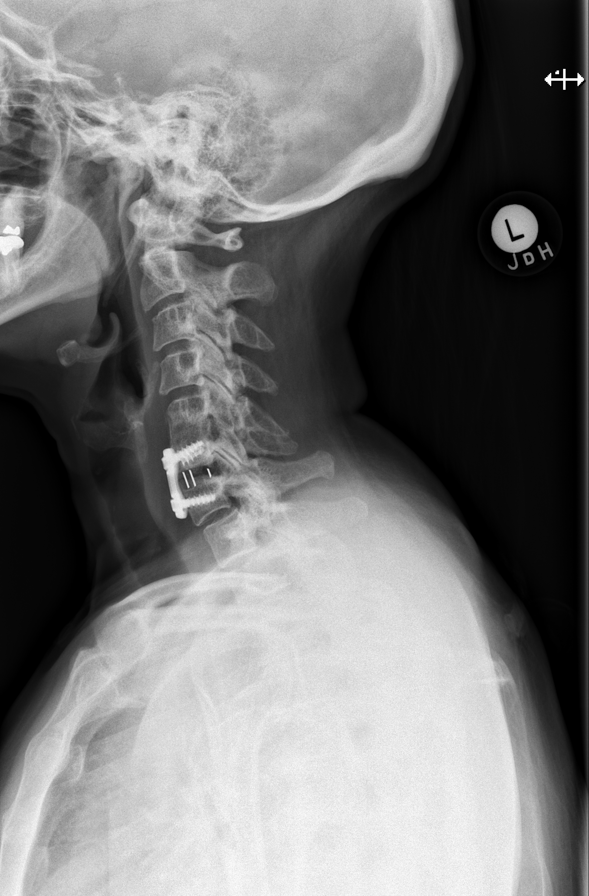

[w cervical spine flexion]
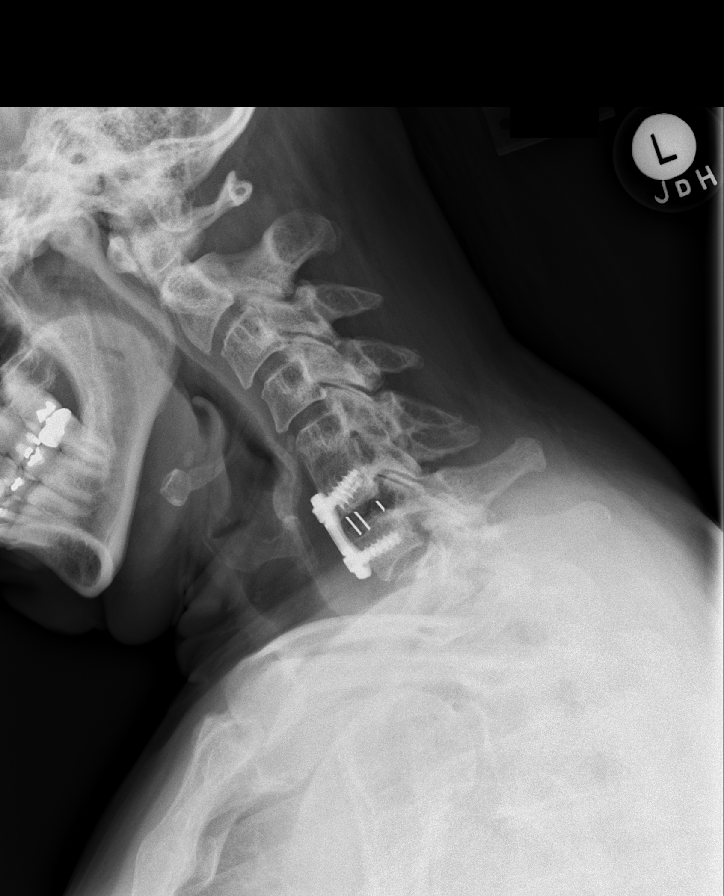

[w cervical spine extension]
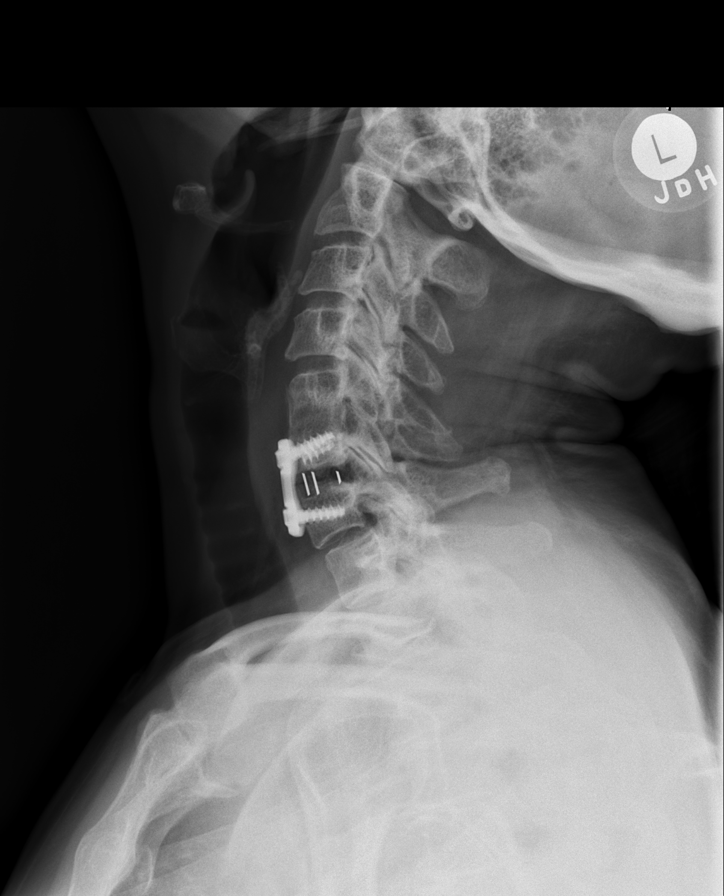

[w cervical spine ap_obl (1 of 2)]
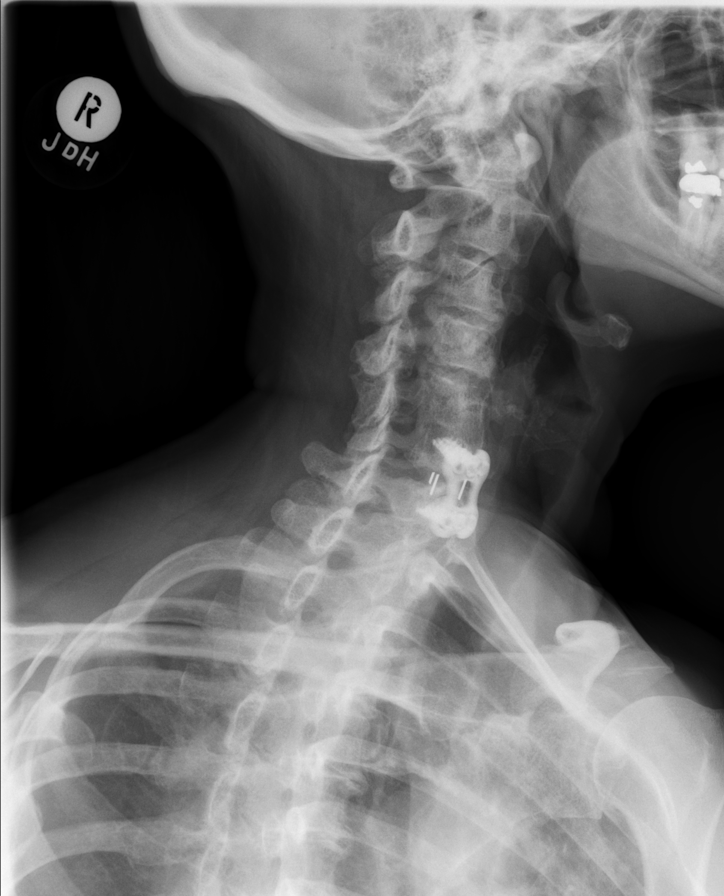

[w cervical spine ap_obl (2 of 2)]
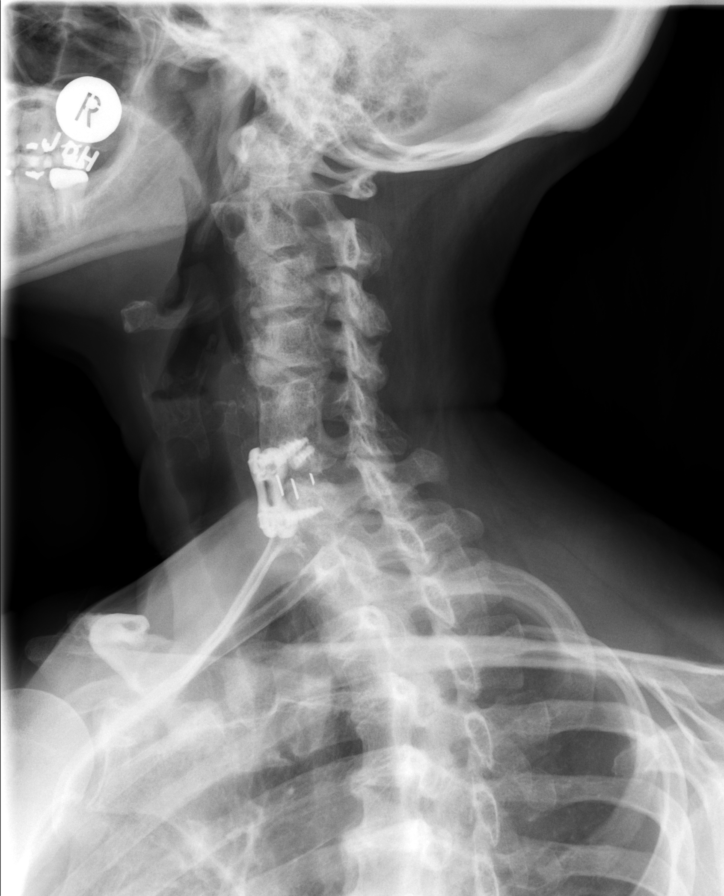

[w cervical spine ap]
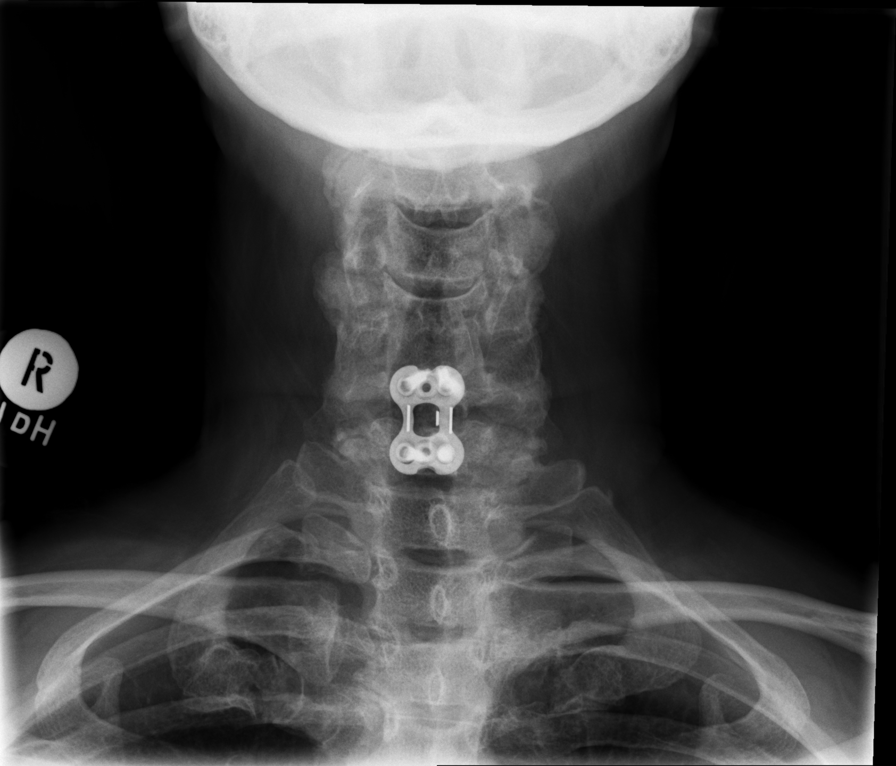

[w cervical spine odontoid]
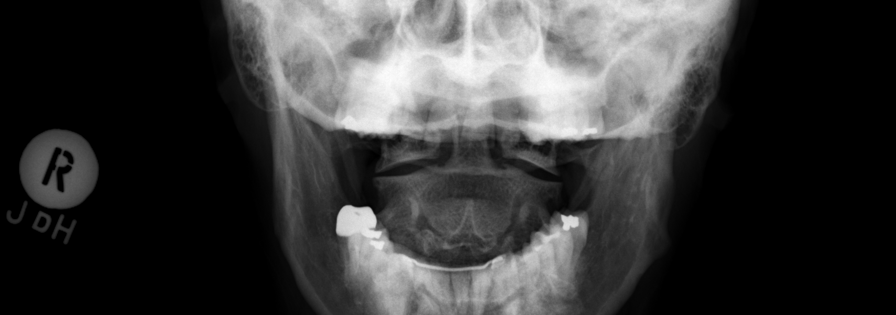

[w cervical swimmers]
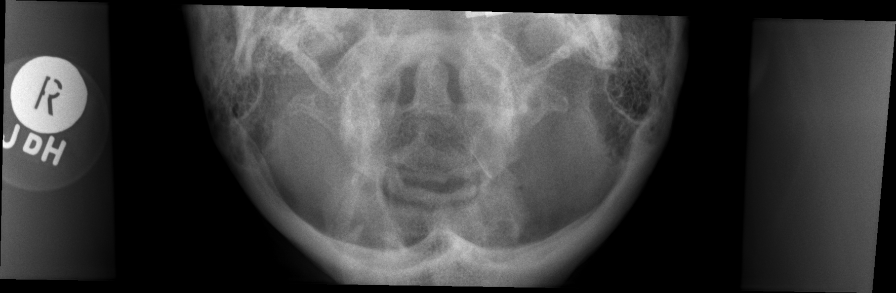

[8 of 8 positions shown; findings below may reference images not displayed]

FINDINGS: Frontal, neutral lateral, flexion lateral, extension lateral,
open-mouth odontoid, and bilateral oblique views were obtained.
Patient is status post anterior screw and plate fixation at C6 and
C7. Screw tips are in the respective vertebral bodies. There is a
disc spacer at C6-7. There is ankylosis at C5-6.

There is no appreciable fracture. On neutral lateral imaging, there
is 1 mm of anterolisthesis of C7 on T1. This mild spondylolisthesis
does not change between neutral lateral, flexion lateral, extension
lateral positioning. There is no other spondylolisthesis. There is
moderate disc space narrowing at C7-T1. Ankylosis noted at C5-6.
Other disc spaces appear unremarkable. There is moderate exit
foraminal narrowing due to bony hypertrophy at C3-4 and C4-5
bilaterally. No erosive changes. Lung apices are clear. There is
mild reversal of lordotic curvature on neutral lateral imaging.
IMPRESSION: 1. Anterior screw and plate fixation at C6 and C7 with support
hardware intact.

2. 1 mm anterolisthesis of C7 on T1 which does not change between
neutral lateral, flexion lateral, and extension lateral positioning.
No other spondylolisthesis. No fracture.

3.  Areas of osteoarthritic change.  Note ankylosis at C5-6.

4. Reversal of lordotic curvature on neutral lateral imaging, a
finding likely indicative of a degree of muscle spasm.

## 2018-12-28 ENCOUNTER — Ambulatory Visit (INDEPENDENT_AMBULATORY_CARE_PROVIDER_SITE_OTHER): Payer: 59 | Admitting: Psychology

## 2018-12-28 DIAGNOSIS — F429 Obsessive-compulsive disorder, unspecified: Secondary | ICD-10-CM | POA: Diagnosis not present

## 2018-12-28 DIAGNOSIS — F4310 Post-traumatic stress disorder, unspecified: Secondary | ICD-10-CM

## 2019-01-02 ENCOUNTER — Other Ambulatory Visit: Payer: Self-pay | Admitting: Neurology

## 2019-01-04 ENCOUNTER — Ambulatory Visit: Payer: 59 | Admitting: Psychology

## 2019-01-11 ENCOUNTER — Ambulatory Visit (INDEPENDENT_AMBULATORY_CARE_PROVIDER_SITE_OTHER): Payer: 59 | Admitting: Psychology

## 2019-01-11 DIAGNOSIS — F4310 Post-traumatic stress disorder, unspecified: Secondary | ICD-10-CM

## 2019-01-11 DIAGNOSIS — F429 Obsessive-compulsive disorder, unspecified: Secondary | ICD-10-CM | POA: Diagnosis not present

## 2019-01-18 ENCOUNTER — Ambulatory Visit: Payer: 59 | Admitting: Psychology

## 2019-01-22 ENCOUNTER — Other Ambulatory Visit: Payer: Self-pay | Admitting: *Deleted

## 2019-01-22 ENCOUNTER — Ambulatory Visit (INDEPENDENT_AMBULATORY_CARE_PROVIDER_SITE_OTHER): Payer: Managed Care, Other (non HMO) | Admitting: Allergy and Immunology

## 2019-01-22 ENCOUNTER — Other Ambulatory Visit: Payer: Self-pay

## 2019-01-22 ENCOUNTER — Encounter: Payer: Self-pay | Admitting: Allergy and Immunology

## 2019-01-22 DIAGNOSIS — Z872 Personal history of diseases of the skin and subcutaneous tissue: Secondary | ICD-10-CM

## 2019-01-22 DIAGNOSIS — K219 Gastro-esophageal reflux disease without esophagitis: Secondary | ICD-10-CM

## 2019-01-22 DIAGNOSIS — J3089 Other allergic rhinitis: Secondary | ICD-10-CM | POA: Diagnosis not present

## 2019-01-22 DIAGNOSIS — J4520 Mild intermittent asthma, uncomplicated: Secondary | ICD-10-CM

## 2019-01-22 DIAGNOSIS — L500 Allergic urticaria: Secondary | ICD-10-CM | POA: Diagnosis not present

## 2019-01-22 DIAGNOSIS — R760 Raised antibody titer: Secondary | ICD-10-CM

## 2019-01-22 DIAGNOSIS — Z886 Allergy status to analgesic agent status: Secondary | ICD-10-CM

## 2019-01-22 MED ORDER — EPINEPHRINE 0.3 MG/0.3ML IJ SOAJ: 0.3000 mg | INTRAMUSCULAR | 2 refills | Status: DC | PRN

## 2019-01-22 NOTE — Progress Notes (Signed)
[place]   Follow-up Note  Referring Provider: Thomes Dinning, MD Primary Provider: Thomes Dinning, MD Date of Office Visit: 01/22/2019  Subjective:   Samantha [name] (DOB: 12/24/1970) is a 48 y.o. female who returns to the [facility] on 01/22/2019 in re-evaluation of the following:  HPI: This is a E-med visit requested by patient who is located at home.  Samantha [name] is followed in this clinic for asthma and allergic rhinitis and chronic urticaria and LPR and a history of possible acetaminophen induced dermatitis and history of positive anti-cardiolipin antibody.  I have not seen her in this clinic since 24 July 2018.  Changed to budesonide nasal spray which works better than Nasacort. Using Benadryl occasionally. No need for any antibiotics to treat sinusitis.  Hives stable. Can't use Claritin because caused itching and eczema. This winter developed cold induced urticaria and skin swelling when having cold exposure.  Previously had an issue with heat trigger giving rise to urticaria.  Can't tolerate zyrtec secondary to CNS side effects.  Tried Allergra but causes some itchiness.   Breathing been OK. Coughing / wheezing episodes with exercise. Does not use SABA prior to exercise. Rare activation of action plan occasionally. Apparently had a breathing problem with a new blood pressure medication. No steroid administration for asthma control.  Still breaking out in rash when using acetaminophen. 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  Allergies as of 01/22/2019      Reactions   Bactrim Anaphylaxis   Bee Venom Hives   Chocolate Flavor Anaphylaxis   Cymbalta [duloxetine Hcl] Shortness Of Breath   Other Hives   Insect stings Wine spirit   Aspartame Other (See Comments)   Coconut Oil Other (See Comments)   Cranberry Extract Other (See Comments)   Sulfur Itching   Adhesive [tape]    Ciprofloxacin    Doxycycline    Possibly joint pain or itch   Erythromycin    Upset stomach   Hydrocodone-homatropine    Not true allergy. "irritated"   Levofloxacin    Weakness, myalgia   Lorazepam Other (See Comments)   Hallucination   Meloxicam Itching   Naproxen Sodium Itching   Omeprazole-sodium Bicarbonate    Chest pain-sore all over-HA   Ranitidine    Severe joint pain   Robaxin [methocarbamol]    Made her very tired and unable to sleep at night   Tetracyclines & Related    Tizanidine    "knocked her out"- had a rebound migraine   Triamcinolone    Acetaminophen Rash   Etodolac Rash   Neosporin [neomycin-bacitracin Zn-polymyx] Rash      Medication List      albuterol 108 (90 Base) MCG/ACT inhaler Commonly known as:  VENTOLIN HFA Inhale 2 puffs into the lungs every 4 (four) hours as needed.   ALPRAZolam 1 MG tablet Commonly known as:  XANAX Take 1 tablet (1 mg total) by mouth 2 (two) times daily as needed for anxiety or sleep.   amphetamine-dextroamphetamine 10 MG tablet Commonly known as:  ADDERALL Take 1 tablet (10 mg total) by mouth 3 (three) times daily.   atenolol-chlorthalidone 50-25 MG tablet Commonly known as:  TENORETIC Take 1 tablet by mouth daily.   azelastine 0.1 % nasal spray Commonly known as:  ASTELIN Place 1 spray into both nostrils every evening.   beclomethasone 80 MCG/ACT inhaler Commonly  known as:  Engineer, agricultural Inhale 2 puffs into the lungs 2 (two) times daily.   cholecalciferol 1000 units tablet Commonly known as:  VITAMIN D Take by mouth Nightly.   EPINEPHrine 0.3 mg/0.3 mL Soaj injection Commonly known as:  Auvi-Q Use as directed for severe allergic reaction   fluticasone 110 MCG/ACT inhaler Commonly known as:  FLOVENT HFA Inhale 1 puff into the lungs 2 (two) times daily.   MAGNESIUM PO Take by mouth as needed.   potassium chloride 10 MEQ  tablet Commonly known as:  K-DUR   Rhinocort Allergy 32 MCG/ACT nasal spray Generic drug:  budesonide Place 1 spray into both nostrils daily.   rosuvastatin 10 MG tablet Commonly known as:  CRESTOR Take 10 mg by mouth.   Skyla 13.5 MG Iud Generic drug:  Levonorgestrel by Intrauterine route.   triamcinolone 55 MCG/ACT Aero nasal inhaler Commonly known as:  NASACORT Use one spray in each nostril once daily.   vortioxetine HBr 10 MG Tabs tablet Commonly known as:  TRINTELLIX Take 1 tablet (10 mg total) by mouth daily.       Past Medical History:  Diagnosis Date  . ADD (attention deficit disorder)   . Arthritis   . Asthma   . Chronic back pain   . Chronic neck pain   . Endometriosis   . Fibromyalgia   . GERD (gastroesophageal reflux disease)   . Hernia   . Hypertension   . IBS (irritable bowel syndrome)   . IC (interstitial cystitis)   . Multiple allergies   . OCD (obsessive compulsive disorder)   . Raynaud disease   . Vertigo     Past Surgical History:  Procedure Laterality Date  . BLADDER SURGERY    . CERVICAL FUSION    . CESAREAN SECTION    . HAND SURGERY    . LESION REMOVAL  08/23/2012   Procedure: LESION REMOVAL HAND;  Surgeon: Cammie Sickle., MD;  Location: [facility];  Service: Orthopedics;  Laterality: Left;  . ROTATOR CUFF REPAIR      Review of systems negative except as noted in HPI / PMHx or noted below:  Review of Systems  Constitutional: Negative.   HENT: Negative.   Eyes: Negative.   Respiratory: Negative.   Cardiovascular: Negative.   Gastrointestinal: Negative.   Genitourinary: Negative.   Musculoskeletal: Negative.   Skin: Negative.   Neurological: Negative.   Endo/Heme/Allergies: Negative.   Psychiatric/Behavioral: Negative.      Objective:   There were no vitals filed for this visit.        Physical Exam-deferred  Diagnostics: none  Assessment and Plan:   1. Asthma, mild intermittent,  well-controlled   2. Other allergic rhinitis   3. History of cold-induced urticaria   4. Allergic urticaria   5. LPRD (laryngopharyngeal reflux disease)   6. Anticardiolipin antibody positive   7. Allergy to nonsteroidal anti-inflammatory drug (NSAID)     1. Continue "action plan" for asthma flare:   A. Qvar 80 Redihaler 2 inhalations twice a day with spacer  B. ProAir HFA 2 puffs every 4-6 hours if needed  2. Continue EpiPen, Benadryl, M.D./ER for allergic reaction  3. Continue treatment of allergic rhinitis:   A. Budesonide nasal spray - 1 sprays each nostril two time per day  B. Azelastine nasal spray - 1 sprays each nostril two time per day  4. Can continue over-the-counter antihistamine if needed  5. Continue daily 82 mcg aspirin use for general  preventative health  6. Return to clinic in 6 months or earlier if problem.    Overall it appears that Samantha [name] is stable regarding her respiratory issue and her overactive immune system manifested as urticaria with thermal triggers.  She will remain on a collection of anti-inflammatory agents for her airway as noted above and she can use Benadryl as needed and continue on aspirin for her anticardiolipin antibody positivity and we will see her back in this clinic in 6 months or earlier if there is a problem.  Allena Katz, MD Allergy / Immunology [place]

## 2019-01-22 NOTE — Patient Instructions (Addendum)
  1. Continue "action plan" for asthma flare:   A. Qvar 80 Redihaler 2 inhalations twice a day with spacer  B. ProAir HFA 2 puffs every 4-6 hours if needed  2. Continue EpiPen, Benadryl, M.D./ER for allergic reaction  3. Continue treatment of allergic rhinitis:   A. Budesonide nasal spray - 1 sprays each nostril two time per day  B. Azelastine nasal spray - 1 sprays each nostril two time per day  4. Can continue over-the-counter antihistamine if needed  5. Continue daily 82 mcg aspirin use for general preventative health  6. Return to clinic in 6 months or earlier if problem.

## 2019-01-23 ENCOUNTER — Encounter: Payer: Self-pay | Admitting: Allergy and Immunology

## 2019-01-23 NOTE — Progress Notes (Signed)
Patient is at home. Provider is in office. Start Time: 1122 End time: 1142

## 2019-01-25 ENCOUNTER — Ambulatory Visit: Payer: 59 | Admitting: Psychology

## 2019-01-25 ENCOUNTER — Ambulatory Visit (INDEPENDENT_AMBULATORY_CARE_PROVIDER_SITE_OTHER): Payer: 59 | Admitting: Psychology

## 2019-01-25 DIAGNOSIS — F4310 Post-traumatic stress disorder, unspecified: Secondary | ICD-10-CM

## 2019-01-25 DIAGNOSIS — F429 Obsessive-compulsive disorder, unspecified: Secondary | ICD-10-CM | POA: Diagnosis not present

## 2019-02-01 ENCOUNTER — Ambulatory Visit: Payer: 59 | Admitting: Psychology

## 2019-02-08 ENCOUNTER — Ambulatory Visit: Payer: 59 | Admitting: Psychology

## 2019-02-08 ENCOUNTER — Ambulatory Visit (INDEPENDENT_AMBULATORY_CARE_PROVIDER_SITE_OTHER): Payer: 59 | Admitting: Psychology

## 2019-02-08 DIAGNOSIS — F4310 Post-traumatic stress disorder, unspecified: Secondary | ICD-10-CM

## 2019-02-20 ENCOUNTER — Other Ambulatory Visit (HOSPITAL_COMMUNITY): Payer: Self-pay | Admitting: Psychiatry

## 2019-02-20 ENCOUNTER — Other Ambulatory Visit: Payer: Self-pay | Admitting: *Deleted

## 2019-02-20 MED ORDER — ALBUTEROL SULFATE HFA 108 (90 BASE) MCG/ACT IN AERS: 2.0000 | INHALATION_SPRAY | RESPIRATORY_TRACT | 2 refills | Status: DC | PRN

## 2019-02-22 ENCOUNTER — Ambulatory Visit (INDEPENDENT_AMBULATORY_CARE_PROVIDER_SITE_OTHER): Payer: 59 | Admitting: Psychology

## 2019-02-22 DIAGNOSIS — F4310 Post-traumatic stress disorder, unspecified: Secondary | ICD-10-CM

## 2019-02-22 DIAGNOSIS — F429 Obsessive-compulsive disorder, unspecified: Secondary | ICD-10-CM | POA: Diagnosis not present

## 2019-02-26 ENCOUNTER — Ambulatory Visit (INDEPENDENT_AMBULATORY_CARE_PROVIDER_SITE_OTHER): Payer: 59 | Admitting: Psychiatry

## 2019-02-26 ENCOUNTER — Other Ambulatory Visit: Payer: Self-pay

## 2019-02-26 DIAGNOSIS — F410 Panic disorder [episodic paroxysmal anxiety] without agoraphobia: Secondary | ICD-10-CM | POA: Diagnosis not present

## 2019-02-26 DIAGNOSIS — F4312 Post-traumatic stress disorder, chronic: Secondary | ICD-10-CM

## 2019-02-26 DIAGNOSIS — F908 Attention-deficit hyperactivity disorder, other type: Secondary | ICD-10-CM | POA: Diagnosis not present

## 2019-02-26 MED ORDER — AMPHETAMINE-DEXTROAMPHETAMINE 10 MG PO TABS: 10.0000 mg | ORAL_TABLET | Freq: Three times a day (TID) | ORAL | 0 refills | Status: DC

## 2019-02-26 MED ORDER — ALPRAZOLAM 1 MG PO TABS: 1.0000 mg | ORAL_TABLET | Freq: Three times a day (TID) | ORAL | 1 refills | Status: DC | PRN
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
## 2019-03-08 ENCOUNTER — Ambulatory Visit (INDEPENDENT_AMBULATORY_CARE_PROVIDER_SITE_OTHER): Payer: 59 | Admitting: Psychology

## 2019-03-08 DIAGNOSIS — F429 Obsessive-compulsive disorder, unspecified: Secondary | ICD-10-CM

## 2019-03-08 DIAGNOSIS — F4310 Post-traumatic stress disorder, unspecified: Secondary | ICD-10-CM

## 2019-03-22 ENCOUNTER — Ambulatory Visit (INDEPENDENT_AMBULATORY_CARE_PROVIDER_SITE_OTHER): Payer: 59 | Admitting: Psychology

## 2019-03-22 DIAGNOSIS — F4310 Post-traumatic stress disorder, unspecified: Secondary | ICD-10-CM

## 2019-03-22 DIAGNOSIS — F422 Mixed obsessional thoughts and acts: Secondary | ICD-10-CM

## 2019-04-02 ENCOUNTER — Other Ambulatory Visit: Payer: Self-pay

## 2019-04-02 ENCOUNTER — Ambulatory Visit (INDEPENDENT_AMBULATORY_CARE_PROVIDER_SITE_OTHER): Payer: 59 | Admitting: Psychiatry

## 2019-04-02 DIAGNOSIS — F410 Panic disorder [episodic paroxysmal anxiety] without agoraphobia: Secondary | ICD-10-CM

## 2019-04-02 DIAGNOSIS — F4312 Post-traumatic stress disorder, chronic: Secondary | ICD-10-CM

## 2019-04-02 DIAGNOSIS — F908 Attention-deficit hyperactivity disorder, other type: Secondary | ICD-10-CM | POA: Diagnosis not present

## 2019-04-02 MED ORDER — AMPHETAMINE-DEXTROAMPHETAMINE 10 MG PO TABS: 10.0000 mg | ORAL_TABLET | Freq: Three times a day (TID) | ORAL | 0 refills | Status: DC

## 2019-04-02 MED ORDER — ALPRAZOLAM 1 MG PO TABS: 1.0000 mg | ORAL_TABLET | Freq: Three times a day (TID) | ORAL | 1 refills | Status: AC | PRN
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
## 2019-04-05 ENCOUNTER — Ambulatory Visit: Payer: 59 | Admitting: Psychology

## 2019-04-15 ENCOUNTER — Ambulatory Visit (HOSPITAL_COMMUNITY): Payer: 59 | Admitting: Psychiatry

## 2019-04-19 ENCOUNTER — Ambulatory Visit (INDEPENDENT_AMBULATORY_CARE_PROVIDER_SITE_OTHER): Payer: 59 | Admitting: Psychology

## 2019-04-19 DIAGNOSIS — F422 Mixed obsessional thoughts and acts: Secondary | ICD-10-CM | POA: Diagnosis not present

## 2019-04-19 DIAGNOSIS — F4310 Post-traumatic stress disorder, unspecified: Secondary | ICD-10-CM | POA: Diagnosis not present

## 2019-05-03 ENCOUNTER — Ambulatory Visit (INDEPENDENT_AMBULATORY_CARE_PROVIDER_SITE_OTHER): Payer: 59 | Admitting: Psychology

## 2019-05-03 DIAGNOSIS — F4310 Post-traumatic stress disorder, unspecified: Secondary | ICD-10-CM

## 2019-05-03 DIAGNOSIS — F422 Mixed obsessional thoughts and acts: Secondary | ICD-10-CM

## 2019-05-17 ENCOUNTER — Ambulatory Visit (INDEPENDENT_AMBULATORY_CARE_PROVIDER_SITE_OTHER): Payer: 59 | Admitting: Psychology

## 2019-05-17 DIAGNOSIS — F422 Mixed obsessional thoughts and acts: Secondary | ICD-10-CM | POA: Diagnosis not present

## 2019-05-17 DIAGNOSIS — F4310 Post-traumatic stress disorder, unspecified: Secondary | ICD-10-CM | POA: Diagnosis not present

## 2019-05-27 ENCOUNTER — Other Ambulatory Visit (HOSPITAL_COMMUNITY): Payer: Self-pay | Admitting: Psychiatry

## 2019-05-27 ENCOUNTER — Other Ambulatory Visit: Payer: Self-pay | Admitting: *Deleted

## 2019-05-27 MED ORDER — QVAR REDIHALER 80 MCG/ACT IN AERB: 2.0000 | INHALATION_SPRAY | Freq: Two times a day (BID) | RESPIRATORY_TRACT | 2 refills | Status: DC

## 2019-05-31 ENCOUNTER — Ambulatory Visit (INDEPENDENT_AMBULATORY_CARE_PROVIDER_SITE_OTHER): Payer: 59 | Admitting: Psychology

## 2019-05-31 DIAGNOSIS — F4310 Post-traumatic stress disorder, unspecified: Secondary | ICD-10-CM

## 2019-05-31 DIAGNOSIS — F422 Mixed obsessional thoughts and acts: Secondary | ICD-10-CM | POA: Diagnosis not present

## 2019-06-03 ENCOUNTER — Ambulatory Visit (INDEPENDENT_AMBULATORY_CARE_PROVIDER_SITE_OTHER): Payer: 59 | Admitting: Psychiatry

## 2019-06-03 ENCOUNTER — Other Ambulatory Visit: Payer: Self-pay

## 2019-06-03 DIAGNOSIS — F908 Attention-deficit hyperactivity disorder, other type: Secondary | ICD-10-CM

## 2019-06-03 DIAGNOSIS — F4312 Post-traumatic stress disorder, chronic: Secondary | ICD-10-CM | POA: Diagnosis not present

## 2019-06-03 DIAGNOSIS — F410 Panic disorder [episodic paroxysmal anxiety] without agoraphobia: Secondary | ICD-10-CM | POA: Diagnosis not present

## 2019-06-03 MED ORDER — ALPRAZOLAM 1 MG PO TABS: 1.0000 mg | ORAL_TABLET | Freq: Three times a day (TID) | ORAL | 2 refills | Status: AC | PRN

## 2019-06-03 MED ORDER — AMPHETAMINE-DEXTROAMPHETAMINE 10 MG PO TABS: 10.0000 mg | ORAL_TABLET | Freq: Three times a day (TID) | ORAL | 0 refills | Status: DC
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
## 2019-06-14 ENCOUNTER — Ambulatory Visit (INDEPENDENT_AMBULATORY_CARE_PROVIDER_SITE_OTHER): Payer: 59 | Admitting: Psychology

## 2019-06-14 DIAGNOSIS — F422 Mixed obsessional thoughts and acts: Secondary | ICD-10-CM | POA: Diagnosis not present

## 2019-06-14 DIAGNOSIS — F4310 Post-traumatic stress disorder, unspecified: Secondary | ICD-10-CM

## 2019-06-28 ENCOUNTER — Ambulatory Visit (INDEPENDENT_AMBULATORY_CARE_PROVIDER_SITE_OTHER): Payer: 59 | Admitting: Psychology

## 2019-06-28 DIAGNOSIS — F429 Obsessive-compulsive disorder, unspecified: Secondary | ICD-10-CM | POA: Diagnosis not present

## 2019-06-28 DIAGNOSIS — F4310 Post-traumatic stress disorder, unspecified: Secondary | ICD-10-CM

## 2019-07-12 ENCOUNTER — Ambulatory Visit (INDEPENDENT_AMBULATORY_CARE_PROVIDER_SITE_OTHER): Payer: 59 | Admitting: Psychology

## 2019-07-12 DIAGNOSIS — F429 Obsessive-compulsive disorder, unspecified: Secondary | ICD-10-CM | POA: Diagnosis not present

## 2019-07-12 DIAGNOSIS — F4310 Post-traumatic stress disorder, unspecified: Secondary | ICD-10-CM

## 2019-07-16 ENCOUNTER — Ambulatory Visit: Payer: Managed Care, Other (non HMO) | Admitting: Allergy and Immunology

## 2019-07-18 NOTE — Progress Notes (Signed)
RE: Samantha [name] [name] MRN: [id number] DOB: 12-Jun-1971 Date of Telemedicine Visit: 07/19/2019  Referring provider: Thomes Dinning, MD Primary care provider: Thomes Dinning, MD  Chief Complaint: Asthma (nonproductive coughing for a few weeks now. azithromycin given last week gave by PCP. her left eardrum is inflammed. has some swelling under her eyes where her sinus cavities are. these are very painful. )   Telemedicine Follow Up Visit via Telephone: I connected with Samantha [name] [name] for a follow up on 07/19/19 by telephone and verified that I am speaking with the correct person using two identifiers.   I discussed the limitations, risks, security and privacy concerns of performing an evaluation and management service by telephone and the availability of in person appointments. I also discussed with the patient that there may be a patient responsible charge related to this service. The patient expressed understanding and agreed to proceed.  Patient is at home Provider is at the office.  Visit start time: 1:24 Visit end time: 2:14  History of Present Illness: She is a 48 y.o. female, who is being followed for asthma, allergic rhinitis, reflux, chronic urticaria, and allergy to stinging insect. Her previous allergy office visit was on 01/22/2019 with Dr. Neldon Mc.  At today's visit she reports that, over the last 2 weeks she has been experiencing cough producing clear mucus, nasal congestion, sinus pressure, increased postnasal drainage, popping sound in both ears with the left ear more than the right and pain in both ears.  She reports that she went to her primary care doctor 1 week ago and was given azithromycin which has not improved her symptoms.  She reports that over the last 1 to 2 weeks she has had sweats and chills with a temperature ranging from 98 to 99 degrees.  She reports that she has some " nodules" on her skin that are raised, red, and painful occurring on the bridge of her nose  on both sides.  She reports that these do not have fluid or drainage coming from them.  She reports her asthma has been well controlled with no shortness of breath or wheeze with activity or rest.  She does report a cough that occurs mostly at night and as a result of thick postnasal drainage.  She is currently using Qvar 80-2 puffs twice a day with a spacer and albuterol with some relief of her symptoms.  She reports she is currently using azelastine, Benadryl twice a day, and a combination of Rhinocort and Flonase.  She is not using it daily sinus rinse.  She reports that she has spent most of her time at home, however, others have come and gone from her house.  Her current medications are listed in the chart.      Assessment and Plan: Asthma Continue Qvar 80-2 puffs twice a day to prevent cough or wheeze Continue albuterol 2 puffs every 4 hours as needed for cough or wheeze  Acute sinusitis Begin Augmentin 875 mg one tablet twice a day for 10 days Begin budesonide nasal rinses 1-2 times a day. This will replace Flonase and Rhinocort for right now  Allergic rhinitis Continue with nasal rinses as needed Continue azelastine nasal spray as needed for a runny nose.  For thick post nasal drainage, begin guaifenesin (Mucinex [phone] mg twice a day) to help thin mucus. Increase hydration as tolerated.   Cough Get a COVID test at the drive through site in [place]. We will send this order in for you and we will call with  results as soon as they are available Continue with the plan above  Call the clinic if this treatment plan is not working well for you  Follow up in 2 weeks or sooner if needed.  Return in about 2 weeks (around 08/02/2019), or if symptoms worsen or fail to improve.  Meds ordered this encounter  Medications  . amoxicillin-clavulanate (AUGMENTIN) 875-125 MG tablet    Sig: Take 1 tablet by mouth 2 (two) times daily.    Dispense:  20 tablet    Refill:  0  . budesonide  (PULMICORT) 0.5 MG/2ML nebulizer solution    Sig: Budesonide +saline rinse. Make 240 cc of saline mixture in the [employer] bottle using the prefilled salt packets. Then add the entire 2 mL vial of budesonide to the rinse bottle and mix together. Use twice a day    Dispense:  120 mL    Refill:  3    Medication List:  Current Outpatient Medications  Medication Sig Dispense Refill  . albuterol (VENTOLIN HFA) 108 (90 Base) MCG/ACT inhaler Inhale 2 puffs into the lungs every 4 (four) hours as needed. 1 Inhaler 2  . ALPRAZolam (XANAX) 1 MG tablet Take 1 tablet (1 mg total) by mouth 3 (three) times daily as needed for anxiety. 90 tablet 2  . amphetamine-dextroamphetamine (ADDERALL) 10 MG tablet Take 1 tablet (10 mg total) by mouth 3 (three) times daily. 90 tablet 0  . atenolol-chlorthalidone (TENORETIC) 50-25 MG tablet Take 1 tablet by mouth daily.    Marland Kitchen azelastine (ASTELIN) 0.1 % nasal spray Place 1 spray into both nostrils every evening. 30 mL 5  . beclomethasone (QVAR REDIHALER) 80 MCG/ACT inhaler Inhale 2 puffs into the lungs 2 (two) times daily. 10.6 g 2  . budesonide (RHINOCORT ALLERGY) 32 MCG/ACT nasal spray Place 1 spray into both nostrils daily.    . cholecalciferol (VITAMIN D) 1000 UNITS tablet Take by mouth Nightly.    Marland Kitchen EPINEPHrine (AUVI-Q) 0.3 mg/0.3 mL IJ SOAJ injection Use as directed for severe allergic reaction 2 Device 2  . EPINEPHrine (EPIPEN 2-PAK) 0.3 mg/0.3 mL IJ SOAJ injection Inject 0.3 mLs (0.3 mg total) into the muscle as needed for anaphylaxis. 2 Device 2  . Levonorgestrel (SKYLA) 13.5 MG IUD by Intrauterine route.    Marland Kitchen MAGNESIUM PO Take by mouth as needed.    . mupirocin ointment (BACTROBAN) 2 % Apply topically.    . potassium chloride (K-DUR) 10 MEQ tablet     . rosuvastatin (CRESTOR) 10 MG tablet Take 10 mg by mouth.    Marland Kitchen amoxicillin-clavulanate (AUGMENTIN) 875-125 MG tablet Take 1 tablet by mouth 2 (two) times daily. 20 tablet 0  . budesonide (PULMICORT) 0.5 MG/2ML  nebulizer solution Budesonide +saline rinse. Make 240 cc of saline mixture in the NeilMed bottle using the prefilled salt packets. Then add the entire 2 mL vial of budesonide to the rinse bottle and mix together. Use twice a day 120 mL 3   No current facility-administered medications for this visit.    Allergies: Allergies  Allergen Reactions  . Bactrim Anaphylaxis  . Bee Venom Hives  . Chocolate Flavor Anaphylaxis  . Cymbalta [Duloxetine Hcl] Shortness Of Breath  . Other Hives    Insect stings Wine spirit  . Aspartame Other (See Comments)  . Coconut Oil Other (See Comments)  . Cranberry Extract Other (See Comments)  . Sulfur Itching  . Adhesive [Tape]   . Ciprofloxacin   . Doxycycline     Possibly joint pain or itch   .  Erythromycin     Upset stomach  . Hydrocodone-Homatropine     Not true allergy. "irritated"  . Levofloxacin     Weakness, myalgia  . Lorazepam Other (See Comments)    Hallucination  . Meloxicam Itching  . Naproxen Sodium Itching  . Omeprazole-Sodium Bicarbonate     Chest pain-sore all over-HA  . Ranitidine     Severe joint pain  . Robaxin [Methocarbamol]     Made her very tired and unable to sleep at night  . Tetracyclines & Related   . Tizanidine     "knocked her out"- had a rebound migraine  . Triamcinolone   . Acetaminophen Rash  . Etodolac Rash  . Neosporin [Neomycin-Bacitracin Zn-Polymyx] Rash   I reviewed her past medical history, social history, family history, and environmental history and no significant changes have been reported from previous visit on 01/22/2019.  50Objective: Physical Exam Not obtained as encounter was done via telephone.   Previous notes and tests were reviewed.  I discussed the assessment and treatment plan with the patient. The patient was provided an opportunity to ask questions and all were answered. The patient agreed with the plan and demonstrated an understanding of the instructions.   The patient was advised  to call back or seek an in-person evaluation if the symptoms worsen or if the condition fails to improve as anticipated.  I provided 50 minutes of non-face-to-face time during this encounter.  It was my pleasure to participate in [facility] care today. Please feel free to contact me with any questions or concerns.   Sincerely,  Gareth Morgan, FNP

## 2019-07-18 NOTE — Patient Instructions (Addendum)
Asthma Continue Qvar 80-2 puffs twice a day to prevent cough or wheeze Continue albuterol 2 puffs every 4 hours as needed for cough or wheeze  Acute sinusitis Begin Augmentin 875 mg one tablet twice a day for 10 days Begin budesonide nasal rinses 1-2 times a day. This will replace Flonase and Rhinocort for right now  Allergic rhinitis Continue with nasal rinses as needed Continue azelastine nasal spray as needed for a runny nose.  For thick post nasal drainage, begin guaifenesin (Mucinex [phone] mg twice a day) to help thin mucus. Increase hydration as tolerated.   Cough Get a COVID test at the drive through site in [place]. We will send this order in for you and we will call with results as soon as they are available Continue with the plan above  Call the clinic if this treatment plan is not working well for you  Follow up in 2 weeks or sooner if needed.

## 2019-07-19 ENCOUNTER — Ambulatory Visit: Payer: 59 | Admitting: Psychology

## 2019-07-19 ENCOUNTER — Other Ambulatory Visit: Payer: Self-pay

## 2019-07-19 ENCOUNTER — Ambulatory Visit (INDEPENDENT_AMBULATORY_CARE_PROVIDER_SITE_OTHER): Payer: Managed Care, Other (non HMO) | Admitting: Family Medicine

## 2019-07-19 ENCOUNTER — Encounter: Payer: Self-pay | Admitting: Family Medicine

## 2019-07-19 DIAGNOSIS — Z9189 Other specified personal risk factors, not elsewhere classified: Secondary | ICD-10-CM

## 2019-07-19 DIAGNOSIS — Z20822 Contact with and (suspected) exposure to covid-19: Secondary | ICD-10-CM

## 2019-07-19 MED ORDER — BUDESONIDE 0.5 MG/2ML IN SUSP: RESPIRATORY_TRACT | 3 refills | Status: DC

## 2019-07-19 MED ORDER — AMOXICILLIN-POT CLAVULANATE 875-125 MG PO TABS: 1.0000 | ORAL_TABLET | Freq: Two times a day (BID) | ORAL | 0 refills | Status: DC

## 2019-07-20 LAB — NOVEL CORONAVIRUS, NAA: SARS-CoV-2, NAA: NOT DETECTED

## 2019-07-22 NOTE — Progress Notes (Signed)
Please call and let this patient know her covid test was negative. I can see that she has looked at the result in my chart and I just want to be sure to answer any questions she may have. Thank you

## 2019-07-22 NOTE — Progress Notes (Signed)
Thank you :)

## 2019-07-26 ENCOUNTER — Ambulatory Visit (INDEPENDENT_AMBULATORY_CARE_PROVIDER_SITE_OTHER): Payer: 59 | Admitting: Psychology

## 2019-07-26 ENCOUNTER — Other Ambulatory Visit (HOSPITAL_COMMUNITY): Payer: Self-pay | Admitting: Psychiatry

## 2019-07-26 ENCOUNTER — Other Ambulatory Visit: Payer: Self-pay

## 2019-07-26 DIAGNOSIS — F422 Mixed obsessional thoughts and acts: Secondary | ICD-10-CM | POA: Diagnosis not present

## 2019-07-26 DIAGNOSIS — F4310 Post-traumatic stress disorder, unspecified: Secondary | ICD-10-CM | POA: Diagnosis not present

## 2019-07-26 MED ORDER — AZELASTINE HCL 0.1 % NA SOLN: 1.0000 | Freq: Every evening | NASAL | 5 refills | Status: DC

## 2019-08-02 ENCOUNTER — Encounter: Payer: Self-pay | Admitting: Family Medicine

## 2019-08-02 ENCOUNTER — Ambulatory Visit (INDEPENDENT_AMBULATORY_CARE_PROVIDER_SITE_OTHER): Payer: 59 | Admitting: Psychology

## 2019-08-02 ENCOUNTER — Ambulatory Visit (INDEPENDENT_AMBULATORY_CARE_PROVIDER_SITE_OTHER): Payer: Managed Care, Other (non HMO) | Admitting: Family Medicine

## 2019-08-02 ENCOUNTER — Other Ambulatory Visit: Payer: Self-pay

## 2019-08-02 VITALS — BP 102/60 | HR 78 | Temp 97.2°F | Resp 16

## 2019-08-02 DIAGNOSIS — J339 Nasal polyp, unspecified: Secondary | ICD-10-CM

## 2019-08-02 DIAGNOSIS — F4310 Post-traumatic stress disorder, unspecified: Secondary | ICD-10-CM | POA: Diagnosis not present

## 2019-08-02 DIAGNOSIS — J310 Chronic rhinitis: Secondary | ICD-10-CM | POA: Insufficient documentation

## 2019-08-02 DIAGNOSIS — K219 Gastro-esophageal reflux disease without esophagitis: Secondary | ICD-10-CM | POA: Diagnosis not present

## 2019-08-02 DIAGNOSIS — J4540 Moderate persistent asthma, uncomplicated: Secondary | ICD-10-CM | POA: Diagnosis not present

## 2019-08-02 DIAGNOSIS — F422 Mixed obsessional thoughts and acts: Secondary | ICD-10-CM

## 2019-08-02 MED ORDER — XHANCE 93 MCG/ACT NA EXHU: 2.0000 | INHALANT_SUSPENSION | Freq: Two times a day (BID) | NASAL | 5 refills | Status: DC | PRN

## 2019-08-02 NOTE — Progress Notes (Signed)
[place] [address] Dept: [phone]  FOLLOW UP NOTE  Patient ID: Samantha [name], female    DOB: 1971/03/15  Age: 48 y.o. MRN: [id number] Date of Office Visit: 08/02/2019  Assessment  Chief Complaint: Asthma (ongoing sinus pressure, especially behind her eyes. right eye gives her the most issues. she has ear pain as well. very fatigued. )  HPI Samantha [name] is a 48 year old female who presents to the clinic for a follow up visit. She was last seen in this clinic on 07/19/2019 for evaluation of acute sinusitis, cough, asthma, and allergic rhinitis. In the interim, she has taken azithromycin as well as Augmentin with only slight improvement of her symptoms. At today's visit, she reports that she continues to experience severe nasal congestion and swelling on both sides with the right more sever than the left. She reports that she feels a lot of pressure behind her right eye. She denies vision changes and eye pain. She is currently using budesonide and saline nasal rinses once or twice a day. She reports that she has been evaluated by ENT specialist several years ago and surgery was reportedly suggested, which she declined at that time. Asthma is reported as well controlled with occasional shortness of breath and occasional dry cough which occurs in the evening after reclining in the chair. She continues to use Qvar 80- as needed for a week at a time. She uses her albuterol inhaler infrequently. Reflux is reported as well controlled. Her current medications are lsited in the chart.    Drug Allergies:  Allergies  Allergen Reactions  . Bactrim Anaphylaxis  . Bee Venom Hives  . Chocolate Flavor Anaphylaxis  . Cymbalta [Duloxetine Hcl] Shortness Of Breath  . Other Hives    Insect stings Wine spirit  . Aspartame Other (See Comments)  . Coconut Oil Other (See Comments)  . Cranberry Extract Other (See Comments)  . Sulfur Itching  . Adhesive [Tape]   . Ciprofloxacin    . Doxycycline     Possibly joint pain or itch   . Erythromycin     Upset stomach  . Hydrocodone-Homatropine     Not true allergy. "irritated"  . Levofloxacin     Weakness, myalgia  . Lorazepam Other (See Comments)    Hallucination  . Meloxicam Itching  . Naproxen Sodium Itching  . Omeprazole-Sodium Bicarbonate     Chest pain-sore all over-HA  . Ranitidine     Severe joint pain  . Robaxin [Methocarbamol]     Made her very tired and unable to sleep at night  . Tetracyclines & Related   . Tizanidine     "knocked her out"- had a rebound migraine  . Triamcinolone   . Acetaminophen Rash  . Etodolac Rash  . Neosporin [Neomycin-Bacitracin Zn-Polymyx] Rash    Physical Exam: BP 102/60 (BP Location: Left Arm, Patient Position: Sitting, Cuff Size: Normal)   Pulse 78   Temp (!) 97.2 F (36.2 C) (Temporal)   Resp 16   SpO2 97%    Physical Exam Vitals signs reviewed.  Constitutional:      Appearance: Normal appearance.  HENT:     Head: Normocephalic and atraumatic.     Right Ear: Tympanic membrane normal.     Left Ear: Tympanic membrane normal.     Nose:     Comments: Septal deviation noted. Right sided nasal polyp noted. Bilateral nares erythematous with no nasal drainage noted. Ears normal. Eyes normal.    Mouth/Throat:  Pharynx: Oropharynx is clear.  Eyes:     Conjunctiva/sclera: Conjunctivae normal.  Neck:     Musculoskeletal: Normal range of motion and neck supple.  Cardiovascular:     Rate and Rhythm: Normal rate and regular rhythm.     Heart sounds: Normal heart sounds. No murmur.  Pulmonary:     Effort: Pulmonary effort is normal.     Breath sounds: Normal breath sounds.     Comments: Lungs clear to auscultation Musculoskeletal: Normal range of motion.  Skin:    General: Skin is warm and dry.  Neurological:     Mental Status: She is alert and oriented to person, place, and time.  Psychiatric:        Mood and Affect: Mood normal.        Behavior:  Behavior normal.        Thought Content: Thought content normal.        Judgment: Judgment normal.     Diagnostics: FVC 2.71, FEV1 2.31. Predicted FVC 3.45, predicted FEV1 2.76. Spirometry indicates mild restriction. This is consistent with previous spirometry readings.    Assessment and Plan: 1. Nasal polyposis   2. Moderate persistent asthma without complication   3. LPRD (laryngopharyngeal reflux disease)   4. Chronic rhinitis     Meds ordered this encounter  Medications  . Fluticasone Propionate (XHANCE) 93 MCG/ACT EXHU    Sig: Place 2 sprays into both nostrils 2 (two) times daily as needed.    Dispense:  32 mL    Refill:  5    J33.9 Astelin, Flonase, Rhinocort, Nasacort    Patient Instructions  Nasal polyposis Begin Xhance 2 sprays in each nostril twice a day Begin prednisone 10 mg tablets. Take 2 tablets once a day for 4 days, and take 1 tablet on the 5th day, then stop Refer to ENT for further evaluation and treatment of nasal polyp  Asthma Continue Qvar 80-2 puffs twice a day to prevent cough or wheeze Continue albuterol 2 puffs every 4 hours as needed for cough or wheeze  Allergic rhinitis Continue with nasal rinses as needed Continue azelastine nasal spray as needed for a runny nose.  For thick post nasal drainage, begin guaifenesin (Mucinex [phone] mg twice a day) to help thin mucus. Increase hydration as tolerated.   Call the clinic if this treatment plan is not working well for you  Follow up in 2 months or sooner if needed.   Return in about 2 months (around 10/02/2019), or if symptoms worsen or fail to improve.    Thank you for the opportunity to care for this patient.  Please do not hesitate to contact me with questions.  Gareth Morgan, FNP [facility]

## 2019-08-02 NOTE — Patient Instructions (Addendum)
Nasal polyposis Begin Xhance 2 sprays in each nostril twice a day Begin prednisone 10 mg tablets. Take 2 tablets once a day for 4 days, and take 1 tablet on the 5th day, then stop Refer to ENT for further evaluation and treatment of nasal polyp  Asthma Continue Qvar 80-2 puffs twice a day to prevent cough or wheeze Continue albuterol 2 puffs every 4 hours as needed for cough or wheeze  Allergic rhinitis Continue with nasal rinses as needed Continue azelastine nasal spray as needed for a runny nose.  For thick post nasal drainage, begin guaifenesin (Mucinex [id number] mg twice a day) to help thin mucus. Increase hydration as tolerated.   Call the clinic if this treatment plan is not working well for you  Follow up in 2 months or sooner if needed.

## 2019-08-05 ENCOUNTER — Telehealth: Payer: Self-pay

## 2019-08-05 NOTE — Telephone Encounter (Signed)
Submitted via covermymeds and was unable to complete submission questions. I was advised to contact [employer]. I spoke with Angelica in the Approval department. The submission is pending at this time. I should hear back within 5 business days.

## 2019-08-07 ENCOUNTER — Telehealth: Payer: Self-pay

## 2019-08-07 NOTE — Telephone Encounter (Signed)
-----   Message from Dara Hoyer, FNP sent at 08/02/2019  3:03 PM EST ----- Can you please refer to ENT for nasal polyposis evaluation and treatment? Thank you

## 2019-08-07 NOTE — Telephone Encounter (Signed)
Referral has been placed to [facility].  They will contact the patient for scheduling.  Thanks

## 2019-08-09 ENCOUNTER — Ambulatory Visit: Payer: 59 | Admitting: Psychology

## 2019-08-12 NOTE — Telephone Encounter (Signed)
Contact patient and find out which medication she is failed in the past and if she is truly failed all of these medications noted on the exclusion list and then complete the prior Auth again.  If she has not failed all of these medications and have her use these medications.

## 2019-08-12 NOTE — Telephone Encounter (Signed)
[employer] responded to PA for [facility] and was denied due to diagnoses of nasal polyposis or at least 12 weeks of persistent symptoms of nasal obstruction, rhinorrhea or loss of smell. CT scan showing symptoms and a fail of Fluticasone 50 mcg (which she has tried), flunisolide 25 mcg and mometasone 50 mcg. Please advise

## 2019-08-13 NOTE — Telephone Encounter (Signed)
Spoke with patient and stated she was covered for the first Rx for Xhance due to the [employer] assistance program but due to denial of PA for [facility] she will have to pay $100 per script for her copay. Patient has tried and failed Rhinocort, Nasacort and Flonase. Patient needs to try Flunisolide 25 mcg in order to get [place] approve in the future.

## 2019-08-13 NOTE — Telephone Encounter (Signed)
Please inform patient that she needs to try Flunisolide 25 mcg in order to get [place] approve in the future. Please provide script for 1-2 sprays each nostril 1-2 times per day

## 2019-08-13 NOTE — Telephone Encounter (Signed)
Left message for patient to call.

## 2019-08-14 MED ORDER — FLUNISOLIDE 25 MCG/ACT (0.025%) NA SOLN: 1.0000 | Freq: Two times a day (BID) | NASAL | 2 refills | Status: DC

## 2019-08-14 NOTE — Addendum Note (Signed)
Addended by: Valere Dross on: 08/14/2019 08:52 AM   Modules accepted: Orders

## 2019-08-14 NOTE — Telephone Encounter (Signed)
Patient notified Flunisolide sent to pharmacy. Patient would try this for 3 months to see if it works, and if it doesn't she will attempt to get Truett Perna approved since this will be the third medication she has tried.

## 2019-08-16 ENCOUNTER — Ambulatory Visit (INDEPENDENT_AMBULATORY_CARE_PROVIDER_SITE_OTHER): Payer: 59 | Admitting: Psychology

## 2019-08-16 DIAGNOSIS — F4310 Post-traumatic stress disorder, unspecified: Secondary | ICD-10-CM

## 2019-08-16 DIAGNOSIS — F429 Obsessive-compulsive disorder, unspecified: Secondary | ICD-10-CM | POA: Diagnosis not present

## 2019-08-23 ENCOUNTER — Ambulatory Visit (INDEPENDENT_AMBULATORY_CARE_PROVIDER_SITE_OTHER): Payer: 59 | Admitting: Psychology

## 2019-08-23 ENCOUNTER — Other Ambulatory Visit (HOSPITAL_COMMUNITY): Payer: Self-pay | Admitting: Psychiatry

## 2019-08-23 ENCOUNTER — Other Ambulatory Visit: Payer: Self-pay | Admitting: Allergy and Immunology

## 2019-08-23 DIAGNOSIS — F422 Mixed obsessional thoughts and acts: Secondary | ICD-10-CM

## 2019-08-23 DIAGNOSIS — F4310 Post-traumatic stress disorder, unspecified: Secondary | ICD-10-CM | POA: Diagnosis not present

## 2019-08-30 ENCOUNTER — Ambulatory Visit (INDEPENDENT_AMBULATORY_CARE_PROVIDER_SITE_OTHER): Payer: 59 | Admitting: Psychology

## 2019-08-30 DIAGNOSIS — F429 Obsessive-compulsive disorder, unspecified: Secondary | ICD-10-CM | POA: Diagnosis not present

## 2019-08-30 DIAGNOSIS — F4310 Post-traumatic stress disorder, unspecified: Secondary | ICD-10-CM | POA: Diagnosis not present

## 2019-09-02 ENCOUNTER — Ambulatory Visit (INDEPENDENT_AMBULATORY_CARE_PROVIDER_SITE_OTHER): Payer: 59 | Admitting: Psychiatry

## 2019-09-02 ENCOUNTER — Other Ambulatory Visit: Payer: Self-pay

## 2019-09-02 DIAGNOSIS — F4312 Post-traumatic stress disorder, chronic: Secondary | ICD-10-CM

## 2019-09-02 DIAGNOSIS — F410 Panic disorder [episodic paroxysmal anxiety] without agoraphobia: Secondary | ICD-10-CM

## 2019-09-02 DIAGNOSIS — F908 Attention-deficit hyperactivity disorder, other type: Secondary | ICD-10-CM | POA: Diagnosis not present

## 2019-09-02 MED ORDER — AMPHETAMINE-DEXTROAMPHETAMINE 10 MG PO TABS: ORAL_TABLET | ORAL | 0 refills | Status: DC

## 2019-09-02 MED ORDER — ALPRAZOLAM 1 MG PO TABS: 1.0000 mg | ORAL_TABLET | Freq: Two times a day (BID) | ORAL | 2 refills | Status: DC | PRN

## 2019-09-02 MED ORDER — CLONAZEPAM 1 MG PO TABS: 1.0000 mg | ORAL_TABLET | Freq: Every evening | ORAL | 0 refills | Status: DC | PRN
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
## 2019-09-10 ENCOUNTER — Ambulatory Visit (INDEPENDENT_AMBULATORY_CARE_PROVIDER_SITE_OTHER): Payer: 59 | Admitting: Psychology

## 2019-09-10 DIAGNOSIS — F4310 Post-traumatic stress disorder, unspecified: Secondary | ICD-10-CM

## 2019-09-10 DIAGNOSIS — F422 Mixed obsessional thoughts and acts: Secondary | ICD-10-CM

## 2019-09-11 ENCOUNTER — Telehealth: Payer: Self-pay | Admitting: *Deleted

## 2019-09-11 MED ORDER — BUDESONIDE 32 MCG/ACT NA SUSP: 1.0000 | Freq: Every day | NASAL | 5 refills | Status: DC | PRN

## 2019-09-11 NOTE — Telephone Encounter (Signed)
Please inform patient that the change in medication was directed by her insurance company.  Certainly she should not be using a nasal spray that produces such discomfort.  Lets see if we can get her another form of nasal spray approved by her insurance company such as generic mometasone or budesonide.

## 2019-09-11 NOTE — Telephone Encounter (Signed)
Spoke with patient and she stated that she used to get budesonide over the counter when she could find it, however she stated she has been able to fine it. It appears budesonide is cover by her benefits plan. Rx has been sent in.

## 2019-09-11 NOTE — Telephone Encounter (Signed)
Patient called with an update stating that we prescribed slunisolide. Patient states that it causes extreme burning,itching, watery eyes, cough and throat irritation/hoarsness. Patient is wondering if she should switch back to flonase.

## 2019-09-20 ENCOUNTER — Ambulatory Visit: Payer: 59 | Admitting: Psychology

## 2019-10-04 ENCOUNTER — Ambulatory Visit: Payer: Managed Care, Other (non HMO) | Admitting: Family Medicine

## 2019-10-18 ENCOUNTER — Ambulatory Visit: Payer: 59 | Admitting: Psychology

## 2019-10-21 ENCOUNTER — Telehealth (HOSPITAL_COMMUNITY): Payer: Self-pay | Admitting: *Deleted

## 2019-10-21 ENCOUNTER — Other Ambulatory Visit (HOSPITAL_COMMUNITY): Payer: Self-pay | Admitting: Psychiatry

## 2019-10-21 MED ORDER — QUETIAPINE FUMARATE 50 MG PO TABS: 50.0000 mg | ORAL_TABLET | Freq: Every day | ORAL | 0 refills | Status: DC

## 2019-10-21 NOTE — Telephone Encounter (Signed)
She already has rx for Xanax which is "fast acting". It would be valuable to know what kind of "reaction" she had to clonazepam - it was presribed to take at bedtime as a longer acting benzodiazepine to help with sleep.

## 2019-10-21 NOTE — Telephone Encounter (Signed)
Writer returned pt call and was able to speak with pt this time. Pt c/o that Klonopin was not effective for sleep and that she was extremely irritable the next day. As well as not being effective pt states that medication was giving her "wild acid dreams". 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 Writer discussed that the Xanax is very fast acting, Klonopin long acting. Pt states that she uses Valium as a muscle relaxant. However says that benzos do not make her sleepy. Please review.

## 2019-10-21 NOTE — Telephone Encounter (Signed)
Writer returned pt message regarding having "a reaction to the Klonopin" and says she wants something "fast acting". Writer asked if pt was taking Xanax? And what the reaction was to Klonopin. Pt advised to return nurse call.

## 2019-10-21 NOTE — Telephone Encounter (Signed)
She has tried multiple sedating meds including tricyclic antidepressants, trazodone, Belsomra and Ambien. I added 50 mg of Seroquel on a trial basis.

## 2019-11-08 ENCOUNTER — Telehealth: Payer: Self-pay

## 2019-11-08 NOTE — Telephone Encounter (Signed)
[facility] [employer] APPROVED  AMPHETAMINE-DEXTROAMPHETAMINE 10MG  TABLET EFFECTIVE 11/07/2019 TO 11/06/2022

## 2019-11-08 NOTE — Telephone Encounter (Signed)
I spoke with patient to notify her that the PA for her amphetamine-dextroamphetamine 10mg  got Approved. As I was speaking with her, she notified me that she has stopped taking the Clonazepam 1mg  because she stated and her husband stated that it was triggering nasty moods where she was difficult to be around and causing headaches. She stated that she's going to work with the Xanax 1mg  and the Adderall 10mg  and see how she does.

## 2019-11-12 ENCOUNTER — Encounter: Payer: Self-pay | Admitting: Allergy and Immunology

## 2019-11-12 ENCOUNTER — Other Ambulatory Visit: Payer: Self-pay

## 2019-11-12 ENCOUNTER — Ambulatory Visit (INDEPENDENT_AMBULATORY_CARE_PROVIDER_SITE_OTHER): Payer: Managed Care, Other (non HMO) | Admitting: Allergy and Immunology

## 2019-11-12 VITALS — BP 126/78 | HR 74 | Temp 97.8°F | Resp 16

## 2019-11-12 DIAGNOSIS — J310 Chronic rhinitis: Secondary | ICD-10-CM

## 2019-11-12 DIAGNOSIS — J4530 Mild persistent asthma, uncomplicated: Secondary | ICD-10-CM | POA: Diagnosis not present

## 2019-11-12 DIAGNOSIS — L500 Allergic urticaria: Secondary | ICD-10-CM

## 2019-11-12 DIAGNOSIS — K219 Gastro-esophageal reflux disease without esophagitis: Secondary | ICD-10-CM

## 2019-11-12 DIAGNOSIS — J385 Laryngeal spasm: Secondary | ICD-10-CM

## 2019-11-12 NOTE — Patient Instructions (Addendum)
  1. Continue Qvar 80 -2 inhalations 1-2 times per day depending on asthma activity  2. Continue treatment of allergic rhinitis:   A. OTC Nasacort - 1 spray each nostril 1-2 times per day  B. Azelastine nasal spray - 1 sprays each nostril 1-2 times per day  C. Stop Rhinocort / budesonide and Exhance  3. Treat reflux:   A. Minimize caffeine consumption  B. Omeprazole 40 mg - 1 tablet 1 time per day in AM  C. Famotidine 40 mg - 1 tablet 1 time per day in PM  4. Can continue over-the-counter antihistamine, Epi-Pen, Albuterol if needed  5. Return to clinic in 4 weeks or earlier if problem.

## 2019-11-12 NOTE — Progress Notes (Signed)
[facility]   Follow-up Note  Referring Provider: Thomes Dinning, MD Primary Provider: Thomes Dinning, MD Date of Office Visit: 11/12/2019  Subjective:   Samantha [name] (DOB: Sep 27, 1970) is a 49 y.o. female who returns to the [facility] on 11/12/2019 in re-evaluation of the following:  HPI: Samantha [name] returns to this clinic in evaluation of a multitude of different issues including asthma and allergic rhinitis and a history of chronic urticaria and LPR.  My last interaction with her occurred on 22 Jan 2019 with an E-med visit and she has seen our nurse practitioner on several occasions since that point in time.  The major issue for Samantha [name] is that she is having asthma "attacks".  Her attacks are defined as having a tickle in her throat and then an unrelenting coughing spell that occurs with the snap of the finger and with associated gagging and retching and chest pain and inability to speak and after a few minutes develops nasal congestion with this issue for which she will use Qvar, albuterol inhaler, XHANCE nasal spray, and guaifenesin.  This episode can continue up to an hour.  She develops these episodes at least a few times per week.  She only uses her medications when she develops one of these episodes.  Her nose has been doing relatively well.  She uses her nasal steroid and nasal antihistamine on a pretty regular basis.  She has not really been having any issues with hives.  She does not have a history of reflux disease.  She does have a history of panic disorder.  Allergies as of 11/12/2019      Reactions   Bactrim Anaphylaxis   Bee Venom Hives   Chocolate Flavor Anaphylaxis   Cymbalta [duloxetine Hcl] Shortness Of Breath   Other Hives   Insect stings Wine spirit   Aspartame Other (See Comments)   Coconut Oil Other (See Comments)   Cranberry Extract Other (See Comments)   Sulfur Itching   Adhesive  [tape]    Ciprofloxacin    Doxycycline    Possibly joint pain or itch   Erythromycin    Upset stomach   Hydrocodone-homatropine    Not true allergy. "irritated"   Levofloxacin    Weakness, myalgia   Lorazepam Other (See Comments)   Hallucination   Meloxicam Itching   Naproxen Sodium Itching   Omeprazole-sodium Bicarbonate    Chest pain-sore all over-HA   Ranitidine    Severe joint pain   Robaxin [methocarbamol]    Made her very tired and unable to sleep at night   Tetracyclines & Related    Tizanidine    "knocked her out"- had a rebound migraine   Triamcinolone    Acetaminophen Rash   Etodolac Rash   Neosporin [neomycin-bacitracin Zn-polymyx] Rash      Medication List    albuterol 108 (90 Base) MCG/ACT inhaler Commonly known as: VENTOLIN HFA Inhale 2 puffs into the lungs every 4 (four) hours as needed.   ALPRAZolam 1 MG tablet Commonly known as: XANAX Take 1 tablet (1 mg total) by mouth 2 (two) times daily as needed for anxiety.   amphetamine-dextroamphetamine 10 MG tablet Commonly known as: ADDERALL TAKE ONE (1) TABLET BY MOUTH 3 TIMES DAILY   atenolol-chlorthalidone 50-25 MG tablet Commonly known as: TENORETIC Take 1 tablet by mouth daily.   azelastine 0.1 % nasal spray Commonly known as: ASTELIN Place 1 spray into both nostrils every evening.  budesonide 0.5 MG/2ML nebulizer solution Commonly known as: PULMICORT Budesonide +saline rinse. Make 240 cc of saline mixture in the NeilMed bottle using the prefilled salt packets. Then add the entire 2 mL vial of budesonide to the rinse bottle and mix together. Use twice a day   cholecalciferol 1000 units tablet Commonly known as: VITAMIN D Take by mouth Nightly.   clonazePAM 1 MG tablet Commonly known as: KLONOPIN Take 1 tablet (1 mg total) by mouth at bedtime as needed for anxiety (insomnia).   EPINEPHrine 0.3 mg/0.3 mL Soaj injection Commonly known as: Auvi-Q Use as directed for severe allergic  reaction   EPINEPHrine 0.3 mg/0.3 mL Soaj injection Commonly known as: EPI-PEN INJECT INTO MUSCLE ONCE FOR ALLERGIC REACTION   flunisolide 25 MCG/ACT (0.025%) Soln Commonly known as: NASALIDE Place 1-2 sprays into the nose 2 (two) times daily.   MAGNESIUM PO Take by mouth as needed.   potassium chloride 10 MEQ tablet Commonly known as: KLOR-CON   QUEtiapine 50 MG tablet Commonly known as: SEROQUEL Take 1 tablet (50 mg total) by mouth at bedtime.   Qvar RediHaler 80 MCG/ACT inhaler Generic drug: beclomethasone INHALE 2 PUFFS INTO LUNGS TWICE A DAY   Rhinocort Allergy 32 MCG/ACT nasal spray Generic drug: budesonide Place 1 spray into both nostrils daily.   budesonide 32 MCG/ACT nasal spray Commonly known as: Rhinocort Aqua Place 1 spray into both nostrils daily as needed for rhinitis.   rosuvastatin 10 MG tablet Commonly known as: CRESTOR Take 10 mg by mouth.   Skyla 13.5 MG Iud Generic drug: Levonorgestrel by Intrauterine route.       Past Medical History:  Diagnosis Date  . ADD (attention deficit disorder)   . Arthritis   . Asthma   . Chronic back pain   . Chronic neck pain   . Endometriosis   . Fibromyalgia   . GERD (gastroesophageal reflux disease)   . Hernia   . Hypertension   . IBS (irritable bowel syndrome)   . IC (interstitial cystitis)   . Multiple allergies   . OCD (obsessive compulsive disorder)   . Raynaud disease   . Vertigo     Past Surgical History:  Procedure Laterality Date  . BLADDER SURGERY    . CERVICAL FUSION    . CESAREAN SECTION    . HAND SURGERY    . LESION REMOVAL  08/23/2012   Procedure: LESION REMOVAL HAND;  Surgeon: Cammie Sickle., MD;  Location: [facility];  Service: Orthopedics;  Laterality: Left;  . ROTATOR CUFF REPAIR      Review of systems negative except as noted in HPI / PMHx or noted below:  Review of Systems  Constitutional: Negative.   HENT: Negative.   Eyes: Negative.    Respiratory: Negative.   Cardiovascular: Negative.   Gastrointestinal: Negative.   Genitourinary: Negative.   Musculoskeletal: Negative.   Skin: Negative.   Neurological: Negative.   Endo/Heme/Allergies: Negative.   Psychiatric/Behavioral: Negative.      Objective:   Vitals:   11/12/19 1651  BP: 126/78  Pulse: 74  Resp: 16  Temp: 97.8 F (36.6 C)  SpO2: 100%          Physical Exam Constitutional:      Appearance: She is not diaphoretic.  HENT:     Head: Normocephalic.     Right Ear: Tympanic membrane, ear canal and external ear normal.     Left Ear: Tympanic membrane, ear canal and external ear normal.  Nose: Nose normal. No mucosal edema or rhinorrhea.     Mouth/Throat:     Pharynx: Uvula midline. No oropharyngeal exudate.  Eyes:     Conjunctiva/sclera: Conjunctivae normal.  Neck:     Thyroid: No thyromegaly.     Trachea: Trachea normal. No tracheal tenderness or tracheal deviation.  Cardiovascular:     Rate and Rhythm: Normal rate and regular rhythm.     Heart sounds: Normal heart sounds, S1 normal and S2 normal. No murmur.  Pulmonary:     Effort: No respiratory distress.     Breath sounds: Normal breath sounds. No stridor. No wheezing or rales.  Lymphadenopathy:     Head:     Right side of head: No tonsillar adenopathy.     Left side of head: No tonsillar adenopathy.     Cervical: No cervical adenopathy.  Skin:    Findings: No erythema or rash.     Nails: There is no clubbing.  Neurological:     Mental Status: She is alert.     Diagnostics:    Spirometry was performed and demonstrated an FEV1 of 2.14 at 78 % of predicted.   Assessment and Plan:   1. Asthma, well controlled, mild persistent   2. Chronic rhinitis   3. Allergic urticaria   4. LPRD (laryngopharyngeal reflux disease)   5. Laryngeal spasm      1. Continue Qvar 80 -2 inhalations 1-2 times per day depending on asthma activity  2. Continue treatment of allergic  rhinitis:   A. OTC Nasacort - 1 spray each nostril 1-2 times per day  B. Azelastine nasal spray - 1 sprays each nostril 1-2 times per day  C. Stop Rhinocort / budesonide and Exhance  3. Treat reflux:   A. Minimize caffeine consumption  B. Omeprazole 40 mg - 1 tablet 1 time per day in AM  C. Famotidine 40 mg - 1 tablet 1 time per day in PM  4. Can continue over-the-counter antihistamine, Epi-Pen, Albuterol if needed  5. Return to clinic in 4 weeks or earlier if problem.    Samantha [name] has some degree of respiratory tract inflammation which should be easily handled with the use of Qvar and some nasal steroid but I think what she is describing today as her "asthma attacks" is laryngeal spasm most likely triggered by reflux events.  We will start her on aggressive therapy directed against reflux as noted above for the next 4 weeks and I will regroup with her at that point in time and will make an assessment about further evaluation and treatment based upon her response.  Allena Katz, MD Allergy / Immunology [place]

## 2019-11-13 ENCOUNTER — Encounter: Payer: Self-pay | Admitting: Allergy and Immunology

## 2019-11-13 ENCOUNTER — Other Ambulatory Visit: Payer: Self-pay | Admitting: Allergy and Immunology

## 2019-11-27 ENCOUNTER — Telehealth: Payer: Self-pay | Admitting: Allergy and Immunology

## 2019-11-27 MED ORDER — EPINEPHRINE 0.3 MG/0.3ML IJ SOAJ: 0.3000 mg | Freq: Once | INTRAMUSCULAR | 1 refills | Status: AC

## 2019-11-27 NOTE — Telephone Encounter (Signed)
Called and spoke with patient and and discussed sending Samantha [name] to [facility] pharmacy to see if costs can be reduced for her. Patient verbalized understanding. [name] has been sent in.

## 2019-11-27 NOTE — Telephone Encounter (Signed)
Patient called stating that she was prescribed Teva Epi Pen and her pharmacy, [facility], stated that they don't carry that brand. Patient would like to switch the pharmacy to [facility] on [address] in [facility] which stated that they do carry the Teva brand, but could not tell her the cost. Patient states that she has used [facility] and really liked it, but it doesn't matter.   Patient called her insurance and they told her a 3 month supply of Epi Pen's would be $800 no matter what.  Please advise.

## 2019-11-28 ENCOUNTER — Other Ambulatory Visit: Payer: Self-pay

## 2019-11-28 ENCOUNTER — Ambulatory Visit (INDEPENDENT_AMBULATORY_CARE_PROVIDER_SITE_OTHER): Payer: 59 | Admitting: Psychiatry

## 2019-11-28 DIAGNOSIS — F902 Attention-deficit hyperactivity disorder, combined type: Secondary | ICD-10-CM

## 2019-11-28 DIAGNOSIS — F4312 Post-traumatic stress disorder, chronic: Secondary | ICD-10-CM

## 2019-11-28 DIAGNOSIS — F410 Panic disorder [episodic paroxysmal anxiety] without agoraphobia: Secondary | ICD-10-CM

## 2019-11-28 MED ORDER — ATOMOXETINE HCL 40 MG PO CAPS: ORAL_CAPSULE | ORAL | 0 refills | Status: DC

## 2019-11-28 MED ORDER — DIAZEPAM 10 MG PO TABS: 10.0000 mg | ORAL_TABLET | Freq: Two times a day (BID) | ORAL | 2 refills | Status: AC | PRN

## 2019-11-28 MED ORDER — ALPRAZOLAM 1 MG PO TABS: 1.0000 mg | ORAL_TABLET | Freq: Two times a day (BID) | ORAL | 2 refills | Status: AC | PRN
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
## 2019-12-17 ENCOUNTER — Encounter: Payer: Self-pay | Admitting: Allergy and Immunology

## 2019-12-17 ENCOUNTER — Other Ambulatory Visit: Payer: Self-pay

## 2019-12-17 ENCOUNTER — Ambulatory Visit (INDEPENDENT_AMBULATORY_CARE_PROVIDER_SITE_OTHER): Payer: Managed Care, Other (non HMO) | Admitting: Allergy and Immunology

## 2019-12-17 VITALS — BP 110/70 | HR 68 | Temp 97.7°F | Resp 16 | Ht 62.0 in | Wt 173.6 lb

## 2019-12-17 DIAGNOSIS — J385 Laryngeal spasm: Secondary | ICD-10-CM

## 2019-12-17 DIAGNOSIS — J310 Chronic rhinitis: Secondary | ICD-10-CM

## 2019-12-17 DIAGNOSIS — K219 Gastro-esophageal reflux disease without esophagitis: Secondary | ICD-10-CM | POA: Diagnosis not present

## 2019-12-17 DIAGNOSIS — J4530 Mild persistent asthma, uncomplicated: Secondary | ICD-10-CM | POA: Diagnosis not present

## 2019-12-17 DIAGNOSIS — L500 Allergic urticaria: Secondary | ICD-10-CM

## 2019-12-17 MED ORDER — RABEPRAZOLE SODIUM 20 MG PO TBEC: 20.0000 mg | DELAYED_RELEASE_TABLET | Freq: Every day | ORAL | 5 refills | Status: DC

## 2019-12-17 NOTE — Patient Instructions (Addendum)
  1. Continue Qvar 80 -2 inhalations 1-2 times per day depending on asthma activity  2. Continue treatment of allergic rhinitis:   A. OTC Nasacort - 1 spray each nostril 1-2 times per day  B. Azelastine nasal spray - 1 sprays each nostril 1-2 times per day  3. Treat reflux:   A.  Minimize caffeine consumption  B.  AcipHex 20 mg - 1 tablet 1 time per day  4. Can continue over-the-counter antihistamine, Epi-Pen, Albuterol if needed  5. Return to clinic in 12 weeks or earlier if problem.

## 2019-12-17 NOTE — Progress Notes (Signed)
[facility]   Follow-up Note  Referring Provider: Thomes Dinning, MD Primary Provider: Thomes Dinning, MD Date of Office Visit: 12/17/2019  Subjective:   Samantha [name] (DOB: 1971-07-19) is a 49 y.o. female who returns to the [facility] on 12/17/2019 in re-evaluation of the following:  HPI: Samantha [name] returns to this clinic in evaluation of asthma and allergic rhinitis and history of chronic urticaria and LPR and history of hymenoptera venom hypersensitivity state.  Her last visit to this clinic was 12 November 2019 at which point in time she was having difficulty with "asthma attacks "which sounded as though they were episodes of laryngeal spasm.  She has had very little cough and very little issue with her throat and no episodes of laryngeal spasm since her last visit while utilizing a plan of action which includes anti-inflammatory agents for airway and therapy directed against reflux.  Unfortunately, her reflux treatment is starting to cause her to have significant side effects including GI side effects and myalgia in general.  She has not had any issues with urticaria.  She has eliminated all caffeine consumption  She informs me that she has had problems with multiple medications used for reflux in the past.  Apparently she has failed Dexilant and Nexium and famotidine and ranitidine in the past.  She obtained her first Covid vaccination.  Allergies as of 12/17/2019      Reactions   Bactrim Anaphylaxis   Bee Venom Hives   Chocolate Flavor Anaphylaxis   Cymbalta [duloxetine Hcl] Shortness Of Breath   Other Hives   Insect stings Wine spirit   Aspartame Other (See Comments)   Coconut Oil Other (See Comments)   Cranberry Extract Other (See Comments)   Sulfur Itching   Adhesive [tape]    Ciprofloxacin    Doxycycline    Possibly joint pain or itch   Erythromycin    Upset stomach   Hydrocodone-homatropine    Not true allergy. "irritated"   Levofloxacin    Weakness, myalgia   Lorazepam Other (See Comments)   Hallucination   Meloxicam Itching   Naproxen Sodium Itching   Omeprazole-sodium Bicarbonate    Chest pain-sore all over-HA   Ranitidine    Severe joint pain   Robaxin [methocarbamol]    Made her very tired and unable to sleep at night   Tetracyclines & Related    Tizanidine    "knocked her out"- had a rebound migraine   Triamcinolone    Acetaminophen Rash   Etodolac Rash   Neosporin [neomycin-bacitracin Zn-polymyx] Rash      Medication List      albuterol 108 (90 Base) MCG/ACT inhaler Commonly known as: VENTOLIN HFA INHALE 2 PUFFS EVERY 4 HOURS AS NEEDED FOR WHEEZING   ALPRAZolam 1 MG tablet Commonly known as: XANAX Take 1 tablet (1 mg total) by mouth 2 (two) times daily as needed for anxiety.   atenolol-chlorthalidone 50-25 MG tablet Commonly known as: TENORETIC Take 1 tablet by mouth daily.   atomoxetine 40 MG capsule Commonly known as: STRATTERA Take 1 capsule (40 mg total) by mouth daily for 7 days, THEN 2 capsules (80 mg total) daily. Start taking on: November 28, 2019   azelastine 0.1 % nasal spray Commonly known as: ASTELIN Place 1 spray into both nostrils every evening.      cholecalciferol 1000 units tablet Commonly known as: VITAMIN D Take by mouth Nightly.   diazepam 10 MG tablet Commonly known  as: VALIUM Take 1 tablet (10 mg total) by mouth every 12 (twelve) hours as needed for anxiety or sleep.   EPINEPHrine 0.3 mg/0.3 mL Soaj injection Commonly known as: Auvi-Q Use as directed for severe allergic reaction   EPINEPHrine 0.3 mg/0.3 mL Soaj injection Commonly known as: EPI-PEN INJECT INTO MUSCLE ONCE FOR ALLERGIC REACTION   EPINEPHrine 0.3 mg/0.3 mL Soaj injection Commonly known as: EPI-PEN INJECT INTO MUSCLE ONCE FOR ALLERGIC REACTION   flunisolide 25 MCG/ACT (0.025%) Soln Commonly known as: NASALIDE Place 1-2 sprays into the nose 2 (two)  times daily.   MAGNESIUM PO Take by mouth as needed.   Omeprazole 20 MG Tbdd Take 40 mg by mouth daily.   potassium chloride 10 MEQ tablet Commonly known as: KLOR-CON   Qvar RediHaler 80 MCG/ACT inhaler Generic drug: beclomethasone INHALE 2 PUFFS INTO LUNGS TWICE A DAY   Rhinocort Allergy 32 MCG/ACT nasal spray Generic drug: budesonide Place 1 spray into both nostrils daily.   budesonide 32 MCG/ACT nasal spray Commonly known as: Rhinocort Aqua Place 1 spray into both nostrils daily as needed for rhinitis.   rosuvastatin 10 MG tablet Commonly known as: CRESTOR Take 10 mg by mouth.   Skyla 13.5 MG Iud Generic drug: Levonorgestrel by Intrauterine route.       Past Medical History:  Diagnosis Date  . ADD (attention deficit disorder)   . Arthritis   . Asthma   . Chronic back pain   . Chronic neck pain   . Endometriosis   . Fibromyalgia   . GERD (gastroesophageal reflux disease)   . Hernia   . Hypertension   . IBS (irritable bowel syndrome)   . IC (interstitial cystitis)   . Multiple allergies   . OCD (obsessive compulsive disorder)   . Raynaud disease   . Vertigo     Past Surgical History:  Procedure Laterality Date  . BLADDER SURGERY    . CERVICAL FUSION    . CESAREAN SECTION    . HAND SURGERY    . LESION REMOVAL  08/23/2012   Procedure: LESION REMOVAL HAND;  Surgeon: Cammie Sickle., MD;  Location: [facility];  Service: Orthopedics;  Laterality: Left;  . ROTATOR CUFF REPAIR      Review of systems negative except as noted in HPI / PMHx or noted below:  Review of Systems  Constitutional: Negative.   HENT: Negative.   Eyes: Negative.   Respiratory: Negative.   Cardiovascular: Negative.   Gastrointestinal: Negative.   Genitourinary: Negative.   Musculoskeletal: Negative.   Skin: Negative.   Neurological: Negative.   Endo/Heme/Allergies: Negative.   Psychiatric/Behavioral: Negative.      Objective:   Vitals:   12/17/19  1502  BP: 110/70  Pulse: 68  Resp: 16  Temp: 97.7 F (36.5 C)  SpO2: 97%   Height: 5\' 2"  (157.5 cm)  Weight: 173 lb 9.6 oz (78.7 kg)   Physical Exam Constitutional:      Appearance: She is not diaphoretic.  HENT:     Head: Normocephalic.     Right Ear: Tympanic membrane, ear canal and external ear normal.     Left Ear: Tympanic membrane, ear canal and external ear normal.     Nose: Nose normal. No mucosal edema or rhinorrhea.     Mouth/Throat:     Pharynx: Uvula midline. No oropharyngeal exudate.  Eyes:     Conjunctiva/sclera: Conjunctivae normal.  Neck:     Thyroid: No thyromegaly.  Trachea: Trachea normal. No tracheal tenderness or tracheal deviation.  Cardiovascular:     Rate and Rhythm: Normal rate and regular rhythm.     Heart sounds: Normal heart sounds, S1 normal and S2 normal. No murmur.  Pulmonary:     Effort: No respiratory distress.     Breath sounds: Normal breath sounds. No stridor. No wheezing or rales.  Lymphadenopathy:     Head:     Right side of head: No tonsillar adenopathy.     Left side of head: No tonsillar adenopathy.     Cervical: No cervical adenopathy.  Skin:    Findings: No erythema or rash.     Nails: There is no clubbing.  Neurological:     Mental Status: She is alert.     Diagnostics:    Spirometry was performed and demonstrated an FEV1 of 2.30 at 80 % of predicted.  Assessment and Plan:   1. Asthma, well controlled, mild persistent   2. Chronic rhinitis   3. Allergic urticaria   4. LPRD (laryngopharyngeal reflux disease)   5. Laryngeal spasm      1. Continue Qvar 80 -2 inhalations 1-2 times per day depending on asthma activity  2. Continue treatment of allergic rhinitis:   A. OTC Nasacort - 1 spray each nostril 1-2 times per day  B. Azelastine nasal spray - 1 sprays each nostril 1-2 times per day  3. Treat reflux:   A.  Minimize caffeine consumption  B.  AcipHex 20 mg - 1 tablet 1 time per day  4. Can continue  over-the-counter antihistamine, Epi-Pen, Albuterol if needed  5. Return to clinic in 12 weeks or earlier if problem.    Ricka appears to be doing relatively well on her current therapy and it appears as though reflux was one of the issues giving rise to some of her laryngeal instability.  We need to find an agent that she can utilize to address this issue and we will try her on AcipHex if she fails that agent we will try pantoprazole.  Minimizing caffeine consumption has certainly made a difference for her as well regarding her reflux episodes.  She will stay on anti-inflammatory agents for both her upper and lower airway as noted above.  I will see her back in his clinic in 12 weeks or earlier if there is a problem.  Allena Katz, MD Allergy / Immunology [facility]

## 2019-12-18 ENCOUNTER — Encounter: Payer: Self-pay | Admitting: Allergy and Immunology

## 2019-12-26 ENCOUNTER — Telehealth (HOSPITAL_COMMUNITY): Payer: Self-pay

## 2019-12-26 NOTE — Telephone Encounter (Signed)
Received prior authorization request for Atomoxetine capsules. Unclear if authorization is for refill as request states last pick up day was 12/04/19. Attempted to call pt for clarification. No answer. Left VM.

## 2019-12-27 ENCOUNTER — Telehealth (HOSPITAL_COMMUNITY): Payer: Self-pay

## 2019-12-27 NOTE — Telephone Encounter (Signed)
[facility] PRESCRIPTION COVERAGE APPROVED  ATOMOXETINE 40MG  CAPSULE PA# [id number] EFFECTIVE 12/27/2019 TO 12/27/2022

## 2020-01-02 ENCOUNTER — Other Ambulatory Visit: Payer: Self-pay

## 2020-01-02 ENCOUNTER — Telehealth (INDEPENDENT_AMBULATORY_CARE_PROVIDER_SITE_OTHER): Payer: Self-pay | Admitting: Psychiatry

## 2020-01-02 DIAGNOSIS — F4312 Post-traumatic stress disorder, chronic: Secondary | ICD-10-CM

## 2020-01-02 DIAGNOSIS — F410 Panic disorder [episodic paroxysmal anxiety] without agoraphobia: Secondary | ICD-10-CM

## 2020-01-02 DIAGNOSIS — F902 Attention-deficit hyperactivity disorder, combined type: Secondary | ICD-10-CM
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
## 2020-01-04 ENCOUNTER — Other Ambulatory Visit (HOSPITAL_COMMUNITY): Payer: Self-pay | Admitting: Psychiatry

## 2020-01-20 ENCOUNTER — Other Ambulatory Visit (HOSPITAL_COMMUNITY): Payer: Self-pay | Admitting: Psychiatry

## 2020-01-20 ENCOUNTER — Telehealth (HOSPITAL_COMMUNITY): Payer: Self-pay | Admitting: *Deleted

## 2020-01-20 MED ORDER — METHYLPHENIDATE HCL 10 MG PO TABS: 10.0000 mg | ORAL_TABLET | Freq: Three times a day (TID) | ORAL | 0 refills | Status: DC

## 2020-01-20 NOTE — Telephone Encounter (Signed)
Electronic transmission still does not work for controlled substances. I cannot call it in - it has to be either written RX or electronic. I will try again tomorrow and if it does not work I will be in [place] tomorrow night and do a written Rx for Ritalin 10 mg tid. I spoke with [facility] about these problems.

## 2020-01-20 NOTE — Telephone Encounter (Signed)
Pt called with c/o s/e from "the Strattera" . Pt has a list of "side effects" I.e.increased fatigue, headaches, dry mouth, freezing cold, hands and feet turning blue, decreased appetite, brain fog, decreased sleep, general malaise causing her to stay in bed for one week. Denies any fevers. Pt has an upcoming appointment with you on 03/04/20. Pt would like to try something different and would like you to call. She has stopped taking medication on her own. Please review and advise and I"ll be happy to call.

## 2020-01-20 NOTE — Telephone Encounter (Signed)
She did not tolerate Adderall relatively low dose (irritable) and requested Strattera as her brother did well on it. Another option would be to try Ritalin (Concerta?) if she wants. I would suggest medium 27 mg dose until we know if she tolerates it better than Adderall. I can call her around 5 PM but if she wants to try it I can just order it around that time.

## 2020-01-20 NOTE — Telephone Encounter (Signed)
Ok sounds good. Thank you!

## 2020-01-22 ENCOUNTER — Telehealth: Payer: Self-pay | Admitting: Allergy and Immunology

## 2020-01-22 DIAGNOSIS — K219 Gastro-esophageal reflux disease without esophagitis: Secondary | ICD-10-CM

## 2020-01-22 DIAGNOSIS — J385 Laryngeal spasm: Secondary | ICD-10-CM

## 2020-01-22 NOTE — Telephone Encounter (Signed)
Patient called and said that she finish the rabeprazole and she is having upset stomach and abdominal pain, having diarrhea, blood in stools, legs are swelling, and she is gaining weight. She has a lot of gas and she not drinking drinks or caffeine. Needs to know what to do. [facility] [address]. [phone].

## 2020-01-22 NOTE — Telephone Encounter (Signed)
Dr Kozlow please advise 

## 2020-01-23 ENCOUNTER — Telehealth (HOSPITAL_COMMUNITY): Payer: Self-pay | Admitting: *Deleted

## 2020-01-23 NOTE — Telephone Encounter (Signed)
Pa approved for Ritalin 10mg . Case ID # [id number]. Pt contacted and is aware.

## 2020-01-27 NOTE — Telephone Encounter (Signed)
Dr. Havery Moros

## 2020-01-27 NOTE — Telephone Encounter (Signed)
Called and spoke with patient and she stated that she never did see the GI doctor from last year because it was right around when [place] started. She stated that she was seeing someone a while back in [facility] but she does not want to see them again. Please advise any recommendations for a GI doctor in [place] which the patient is fine with, and I will work on this referral.

## 2020-01-27 NOTE — Telephone Encounter (Signed)
Ambulatory referral has been placed for Dr. Havery Moros for laryngopharyngeal reflux disease and Laryngeal Spasm.

## 2020-01-27 NOTE — Telephone Encounter (Signed)
We will need to have her revaluated by GI.  I do not know which GI doctor she sees but if she is not satisfied with that GI doctor regarding her GI complaints then we can send her to a different doctor.  Please let us know.

## 2020-01-27 NOTE — Addendum Note (Signed)
Addended by: Chip Boer R on: 01/27/2020 01:15 PM   Modules accepted: Orders

## 2020-01-27 NOTE — Telephone Encounter (Signed)
Called and advised to patient of referral. Patient verbalized understanding.

## 2020-01-28 ENCOUNTER — Encounter: Payer: Self-pay | Admitting: Gastroenterology

## 2020-01-28 NOTE — Telephone Encounter (Signed)
Thank you Samantha [name], I will keep an eye on this referral for scheduling.

## 2020-02-04 DIAGNOSIS — Z8619 Personal history of other infectious and parasitic diseases: Secondary | ICD-10-CM | POA: Insufficient documentation

## 2020-02-07 ENCOUNTER — Telehealth (HOSPITAL_COMMUNITY): Payer: Self-pay

## 2020-02-07 NOTE — Telephone Encounter (Signed)
RECEIVED PA FROM PHARMACY FOR PATIENT'S  METHYLPHENIDATE 10MG  TABLET. [facility] STATED THAT THIS PATIENT'S MEDICATION DOES NOT REQUIRE A PA. THEY HAVE A PAID CLAIM ON FILE - NEXT FILL DATE IS 6/10

## 2020-03-04 ENCOUNTER — Telehealth (INDEPENDENT_AMBULATORY_CARE_PROVIDER_SITE_OTHER): Payer: No Typology Code available for payment source | Admitting: Psychiatry

## 2020-03-04 ENCOUNTER — Other Ambulatory Visit: Payer: Self-pay

## 2020-03-04 DIAGNOSIS — F4001 Agoraphobia with panic disorder: Secondary | ICD-10-CM

## 2020-03-04 DIAGNOSIS — F902 Attention-deficit hyperactivity disorder, combined type: Secondary | ICD-10-CM | POA: Diagnosis not present

## 2020-03-04 DIAGNOSIS — F4312 Post-traumatic stress disorder, chronic: Secondary | ICD-10-CM | POA: Diagnosis not present

## 2020-03-04 MED ORDER — ALPRAZOLAM 1 MG PO TABS: 1.0000 mg | ORAL_TABLET | Freq: Two times a day (BID) | ORAL | 2 refills | Status: AC | PRN

## 2020-03-04 MED ORDER — METHYLPHENIDATE HCL 10 MG PO TABS: 10.0000 mg | ORAL_TABLET | Freq: Three times a day (TID) | ORAL | 0 refills | Status: DC

## 2020-03-04 MED ORDER — DIAZEPAM 10 MG PO TABS: 10.0000 mg | ORAL_TABLET | Freq: Every evening | ORAL | 2 refills | Status: AC | PRN
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
## 2020-03-10 ENCOUNTER — Other Ambulatory Visit: Payer: Self-pay

## 2020-03-10 ENCOUNTER — Ambulatory Visit (INDEPENDENT_AMBULATORY_CARE_PROVIDER_SITE_OTHER): Payer: No Typology Code available for payment source | Admitting: Allergy and Immunology

## 2020-03-10 ENCOUNTER — Encounter: Payer: Self-pay | Admitting: Allergy and Immunology

## 2020-03-10 VITALS — BP 116/70 | HR 64 | Resp 18 | Ht 63.5 in

## 2020-03-10 DIAGNOSIS — J4530 Mild persistent asthma, uncomplicated: Secondary | ICD-10-CM | POA: Diagnosis not present

## 2020-03-10 DIAGNOSIS — Z91038 Other insect allergy status: Secondary | ICD-10-CM | POA: Diagnosis not present

## 2020-03-10 DIAGNOSIS — J3089 Other allergic rhinitis: Secondary | ICD-10-CM | POA: Diagnosis not present

## 2020-03-10 DIAGNOSIS — L500 Allergic urticaria: Secondary | ICD-10-CM | POA: Diagnosis not present

## 2020-03-10 MED ORDER — ALVESCO 80 MCG/ACT IN AERS: INHALATION_SPRAY | RESPIRATORY_TRACT | 5 refills | Status: DC

## 2020-03-10 NOTE — Progress Notes (Signed)
[facility]   Follow-up Note  Referring Provider: Thomes Dinning, MD Primary Provider: Thomes Dinning, MD Date of Office Visit: 03/10/2020   Subjective:   Samantha [name] (DOB: Apr 09, 1971) is a 49 y.o. female who returns to the [facility] on 03/10/2020 in re-evaluation of the following:  HPI: Samantha [name] returns to this clinic in evaluation of asthma and allergic rhinitis, chronic urticaria, LPR, and history of hymenoptera venom hypersensitivity state.  Her last visit to this clinic was 17 December 2019.  Overall she has done very well with her asthma.  She still occasionally gets anxiety issues with some occasional shortness of breath and wheezing and in the past has developed laryngeal spasm with these events.  She appears to recognize that these are associated with "stress".  Her use of a short acting bronchodilators is about 1 time per week.  For the most part she can exert herself without any problem.  She has not required a systemic steroid to treat an exacerbation of asthma since her last visit.  Her nose still has some nasal congestion especially at night and in the morning.  She is only using her Nasacort and azelastine nose spray 1 time per day.  She has not been having any issues with reflux at this point in time.  She does have a visit with her gastroenterologist in about 2 weeks.  She has not been having any issues with urticaria at this point.  Occasionally she will get some blotchy redness on her skin but this appears to be relatively short-lived and unassociated with any systemic or constitutional symptoms.  She has developed some taste perversion with the use of Qvar and it does make her gag when she utilizes this agent.  She has obtained two [place] vaccinations.  Allergies as of 03/10/2020      Reactions   Bactrim Anaphylaxis   Bee Venom Hives   Chocolate Flavor Anaphylaxis   Cymbalta  [duloxetine Hcl] Shortness Of Breath   Other Hives   Insect stings Wine spirit   Aspartame Other (See Comments)   Coconut Oil Other (See Comments)   Cranberry Extract Other (See Comments)   Sulfur Itching   Adhesive [tape]    Ciprofloxacin    Doxycycline    Possibly joint pain or itch   Erythromycin    Upset stomach   Hydrocodone-homatropine    Not true allergy. "irritated"   Levofloxacin    Weakness, myalgia   Lorazepam Other (See Comments)   Hallucination   Meloxicam Itching   Naproxen Sodium Itching   Omeprazole-sodium Bicarbonate    Chest pain-sore all over-HA   Ranitidine    Severe joint pain   Robaxin [methocarbamol]    Made her very tired and unable to sleep at night   Tetracyclines & Related    Tizanidine    "knocked her out"- had a rebound migraine   Triamcinolone    Acetaminophen Rash   Etodolac Rash   Neosporin [neomycin-bacitracin Zn-polymyx] Rash      Medication List      albuterol 108 (90 Base) MCG/ACT inhaler Commonly known as: VENTOLIN HFA INHALE 2 PUFFS EVERY 4 HOURS AS NEEDED FOR WHEEZING   ALPRAZolam 1 MG tablet Commonly known as: XANAX Take 1 tablet (1 mg total) by mouth 2 (two) times daily as needed for anxiety.   Alvesco 80 MCG/ACT inhaler Generic drug: ciclesonide Inhale 1 puff into the lungs 1-2 times daily depending on  asthma activity. Rinse, gargle, and spit after use. Started by: Jiles Prows, MD   atenolol-chlorthalidone 50-25 MG tablet Commonly known as: TENORETIC Take 1 tablet by mouth daily.   azelastine 0.1 % nasal spray Commonly known as: ASTELIN Place 1 spray into both nostrils every evening.   budesonide 0.5 MG/2ML nebulizer solution Commonly known as: PULMICORT Budesonide +saline rinse. Make 240 cc of saline mixture in the NeilMed bottle using the prefilled salt packets. Then add the entire 2 mL vial of budesonide to the rinse bottle and mix together. Use twice a day   cholecalciferol 1000 units tablet Commonly  known as: VITAMIN D Take by mouth Nightly.   diazepam 10 MG tablet Commonly known as: VALIUM Take 1 tablet (10 mg total) by mouth at bedtime and may repeat dose one time if needed.   EPINEPHrine 0.3 mg/0.3 mL Soaj injection Commonly known as: Auvi-Q Use as directed for severe allergic reaction   EPINEPHrine 0.3 mg/0.3 mL Soaj injection Commonly known as: EPI-PEN INJECT INTO MUSCLE ONCE FOR ALLERGIC REACTION   EPINEPHrine 0.3 mg/0.3 mL Soaj injection Commonly known as: EPI-PEN INJECT INTO MUSCLE ONCE FOR ALLERGIC REACTION   flunisolide 25 MCG/ACT (0.025%) Soln Commonly known as: NASALIDE Place 1-2 sprays into the nose 2 (two) times daily.   MAGNESIUM PO Take by mouth as needed.   methylphenidate 10 MG tablet Commonly known as: Ritalin Take 1 tablet (10 mg total) by mouth 3 (three) times daily with meals.   methylphenidate 10 MG tablet Commonly known as: Ritalin Take 1 tablet (10 mg total) by mouth 3 (three) times daily with meals. Start taking on: March 20, 2020   methylphenidate 10 MG tablet Commonly known as: Ritalin Take 1 tablet (10 mg total) by mouth 3 (three) times daily with meals. Start taking on: April 20, 2020   Omeprazole 20 MG Tbdd Take 40 mg by mouth daily.   potassium chloride 10 MEQ tablet Commonly known as: KLOR-CON   Qvar RediHaler 80 MCG/ACT inhaler Generic drug: beclomethasone INHALE 2 PUFFS INTO LUNGS TWICE A DAY   RABEprazole 20 MG tablet Commonly known as: ACIPHEX Take 1 tablet (20 mg total) by mouth daily.   Rhinocort Allergy 32 MCG/ACT nasal spray Generic drug: budesonide Place 1 spray into both nostrils daily.   budesonide 32 MCG/ACT nasal spray Commonly known as: Rhinocort Aqua Place 1 spray into both nostrils daily as needed for rhinitis.   rosuvastatin 10 MG tablet Commonly known as: CRESTOR Take 10 mg by mouth.   Skyla 13.5 MG Iud Generic drug: Levonorgestrel by Intrauterine route.   SUMAtriptan 50 MG tablet Commonly  known as: IMITREX Take by mouth.   valACYclovir 1000 MG tablet Commonly known as: VALTREX Take by mouth.       Past Medical History:  Diagnosis Date   ADD (attention deficit disorder)    Arthritis    Asthma    Chronic back pain    Chronic neck pain    Endometriosis    Fibromyalgia    GERD (gastroesophageal reflux disease)    Hernia    Hypertension    IBS (irritable bowel syndrome)    IC (interstitial cystitis)    Multiple allergies    OCD (obsessive compulsive disorder)    Raynaud disease    Vertigo     Past Surgical History:  Procedure Laterality Date   BLADDER SURGERY     CERVICAL FUSION     CESAREAN SECTION     HAND SURGERY  LESION REMOVAL  08/23/2012   Procedure: LESION REMOVAL HAND;  Surgeon: Cammie Sickle., MD;  Location: [place];  Service: Orthopedics;  Laterality: Left;   ROTATOR CUFF REPAIR      Review of systems negative except as noted in HPI / PMHx or noted below:  Review of Systems  Constitutional: Negative.   HENT: Negative.   Eyes: Negative.   Respiratory: Negative.   Cardiovascular: Negative.   Gastrointestinal: Negative.   Genitourinary: Negative.   Musculoskeletal: Negative.   Skin: Negative.   Neurological: Negative.   Endo/Heme/Allergies: Negative.   Psychiatric/Behavioral: Negative.      Objective:   Vitals:   03/10/20 1030  BP: 116/70  Pulse: 64  Resp: 18  SpO2: 99%   Height: 5' 3.5" (161.3 cm)      Physical Exam Constitutional:      Appearance: She is not diaphoretic.  HENT:     Head: Normocephalic.     Right Ear: Tympanic membrane, ear canal and external ear normal.     Left Ear: Tympanic membrane, ear canal and external ear normal.     Nose: Nose normal. No mucosal edema or rhinorrhea.     Mouth/Throat:     Pharynx: Uvula midline. No oropharyngeal exudate.  Eyes:     Conjunctiva/sclera: Conjunctivae normal.  Neck:     Thyroid: No thyromegaly.     Trachea:  Trachea normal. No tracheal tenderness or tracheal deviation.  Cardiovascular:     Rate and Rhythm: Normal rate and regular rhythm.     Heart sounds: Normal heart sounds, S1 normal and S2 normal. No murmur heard.   Pulmonary:     Effort: No respiratory distress.     Breath sounds: Normal breath sounds. No stridor. No wheezing or rales.  Lymphadenopathy:     Head:     Right side of head: No tonsillar adenopathy.     Left side of head: No tonsillar adenopathy.     Cervical: No cervical adenopathy.  Skin:    Findings: No erythema or rash.     Nails: There is no clubbing.  Neurological:     Mental Status: She is alert.     Diagnostics:    Spirometry was performed and demonstrated an FEV1 of 2.29 at 83 % of predicted.  The patient had an Asthma Control Test with the following results: ACT Total Score: 13.    Assessment and Plan:   1. Asthma, well controlled, mild persistent   2. Allergic urticaria   3. 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 Continue Alvesco 80 -1 inhalation 1-2 times per day depending on asthma activity ([facility])  2. Continue treatment of allergic rhinitis:   A. OTC Nasacort - 1 spray each nostril 1-2 times per day  B. Azelastine nasal spray - 1 sprays each nostril 1-2 times per day  3. Treat reflux:   A.  AcipHex 20 mg - 1 tablet 1 time per day  4. Can continue over-the-counter antihistamine, Epi-Pen, Albuterol if needed  5. Return to clinic in 6 months or earlier if problem.    6. Obtain fall flu vaccine  Samantha [name] is doing relatively well on her current medical plan directed against inflammation of her airway and reflux and occasionally his urticaria.  She does her best to avoid hymenoptera venom exposure and does have an injectable epinephrine device if required.  She does have a taste perversion with the use of Qvar  and we will try her on Alvesco utilizing a sample and a prescription from the [facility] prescription  program.  I will see her back in this clinic in 6 months or earlier if there is a problem.  Allena Katz, MD Allergy / Immunology [place]

## 2020-03-10 NOTE — Patient Instructions (Signed)
  1. Continue Alvesco 80 -1 inhalation 1-2 times per day depending on asthma activity ([facility])  2. Continue treatment of allergic rhinitis:   A. OTC Nasacort - 1 spray each nostril 1-2 times per day  B. Azelastine nasal spray - 1 sprays each nostril 1-2 times per day  3. Treat reflux:   A.  AcipHex 20 mg - 1 tablet 1 time per day  4. Can continue over-the-counter antihistamine, Epi-Pen, Albuterol if needed  5. Return to clinic in 6 months or earlier if problem.    6. Obtain fall flu vaccine

## 2020-03-11 ENCOUNTER — Encounter: Payer: Self-pay | Admitting: Allergy and Immunology

## 2020-03-19 ENCOUNTER — Encounter: Payer: Self-pay | Admitting: Gastroenterology

## 2020-03-19 ENCOUNTER — Ambulatory Visit (INDEPENDENT_AMBULATORY_CARE_PROVIDER_SITE_OTHER): Payer: No Typology Code available for payment source | Admitting: Gastroenterology

## 2020-03-19 VITALS — BP 106/72 | HR 76 | Ht 63.5 in | Wt 176.2 lb

## 2020-03-19 DIAGNOSIS — Z86010 Personal history of colonic polyps: Secondary | ICD-10-CM | POA: Diagnosis not present

## 2020-03-19 DIAGNOSIS — K219 Gastro-esophageal reflux disease without esophagitis: Secondary | ICD-10-CM

## 2020-03-19 DIAGNOSIS — K5909 Other constipation: Secondary | ICD-10-CM | POA: Diagnosis not present

## 2020-03-19 MED ORDER — POLYETHYLENE GLYCOL 3350 17 G PO PACK
17.0000 g | PACK | Freq: Every day | ORAL | 0 refills | Status: DC
Start: 2020-03-19 — End: 2021-07-27

## 2020-03-19 MED ORDER — SUTAB 1479-225-188 MG PO TABS: 1.0000 | ORAL_TABLET | Freq: Once | ORAL | 0 refills | Status: AC

## 2020-03-19 NOTE — Progress Notes (Signed)
HPI :  49 year old female with multiple medication allergies, reported history IBS, history of GERD, Raynaud's, fibromyalgia, referred by Thomes Dinning, MD for management of GERD and history of colon polyps.  The patient states she has had longstanding reflux symptoms since age 31 or so.  She does endorse sensation of pyrosis and pressure in her epigastric area, as well as regurgitation of water brash.  This is occurred intermittently.  She states she has associated coughing from her reflux and she has a history of asthma which can make her asthma worse.  She also has sore throat from reflux at times as well.  She has been attempting to control this with diet but she states she cannot do so, does not really matter what foods she eat can precipitate this.  She has been trying to hold off on dairy, alcohol, and soda but symptoms continue to bother her fairly frequently.  She has occasional dysphagia to pills, no dysphagia solids, she does have some coughing when she drinks water at times.  She has tried a variety of antacids over the years to include Zantac, Pepcid, Dexilant, Nexium, omeprazole, Prevacid, and AcipHex most recently.  She states she has either had an allergy or intolerance to all of these, they make her feel weak, joint pains, headaches, nausea, etc., it appears to vary what reaction she has to these.  She states she is sensitive to many medications and has almost 30 medication allergies listed.  Most recently she was given a trial of AcipHex 20 mg a day.  She states she felt diffusely weak on the medication and that she had some falls and stopped taking it.  She does say when she has been on antacids in the past it definitely makes her reflux symptoms much better and she states the AcipHex clearly provided benefit for her reflux.  She does have some occasional postprandial epigastric pain.  She has had an EGD in October 2016 which was normal.  She is inquiring about other options to treat  her reflux given her intolerance to antacids.  She does tolerate Gaviscon and states this does help when she takes it..   Her last colonoscopy was in October 2016, she had a sessile serrated polyp removed and states she was told to follow-up for colonoscopy in 3 years.  She has chronic constipation, was told she had IBS-C in the past.  She has a bowel movement once every 3 to 5 days.  She has blood in her stools sporadically, takes magnesium supplementation for constipation states it works pretty well.  She has also used MiraLAX as needed.  She thinks her brother had a colon resection for possible growth in the colon but unclear of details.  She is unaware of a definitive history of colon cancer in the family.      Past Medical History:  Diagnosis Date  . ADD (attention deficit disorder)   . Arthritis   . Asthma   . Chronic back pain   . Chronic neck pain   . Endometriosis   . Fibromyalgia   . GERD (gastroesophageal reflux disease)   . Hernia   . Hypertension   . IBS (irritable bowel syndrome)   . IC (interstitial cystitis)   . Multiple allergies   . OCD (obsessive compulsive disorder)   . Raynaud disease   . Vertigo      Past Surgical History:  Procedure Laterality Date  . BLADDER SURGERY    . CERVICAL FUSION    .  CESAREAN SECTION    . HAND SURGERY    . LESION REMOVAL  08/23/2012   Procedure: LESION REMOVAL HAND;  Surgeon: Cammie Sickle., MD;  Location: [facility];  Service: Orthopedics;  Laterality: Left;  . ROTATOR CUFF REPAIR     Family History  Problem Relation Age of Onset  . ADD / ADHD Brother   . Stomach cancer Maternal Grandfather   . Colon cancer Neg Hx    Social History   Tobacco Use  . Smoking status: Never Smoker  . Smokeless tobacco: Never Used  Vaping Use  . Vaping Use: Never used  Substance Use Topics  . Alcohol use: No  . Drug use: No   Current Outpatient Medications  Medication Sig Dispense Refill  . albuterol (VENTOLIN  HFA) 108 (90 Base) MCG/ACT inhaler INHALE 2 PUFFS EVERY 4 HOURS AS NEEDED FOR WHEEZING 18 g 1  . ALPRAZolam (XANAX) 1 MG tablet Take 1 tablet (1 mg total) by mouth 2 (two) times daily as needed for anxiety. 60 tablet 2  . atenolol-chlorthalidone (TENORETIC) 50-25 MG tablet Take 1 tablet by mouth daily.    Marland Kitchen azelastine (ASTELIN) 0.1 % nasal spray Place 1 spray into both nostrils every evening. 30 mL 5  . budesonide (RHINOCORT AQUA) 32 MCG/ACT nasal spray Place 1 spray into both nostrils daily as needed for rhinitis. 8.6 g 5  . cholecalciferol (VITAMIN D) 1000 UNITS tablet Take by mouth Nightly.    . ciclesonide (ALVESCO) 80 MCG/ACT inhaler Inhale 1 puff into the lungs 1-2 times daily depending on asthma activity. Rinse, gargle, and spit after use. 1 Inhaler 5  . diazepam (VALIUM) 10 MG tablet Take 1 tablet (10 mg total) by mouth at bedtime and may repeat dose one time if needed. 60 tablet 2  . EPINEPHrine (AUVI-Q) 0.3 mg/0.3 mL IJ SOAJ injection Use as directed for severe allergic reaction 2 Device 2  . EPINEPHRINE 0.3 mg/0.3 mL IJ SOAJ injection INJECT INTO MUSCLE ONCE FOR ALLERGIC REACTION 2 each 0  . EPINEPHRINE 0.3 mg/0.3 mL IJ SOAJ injection INJECT INTO MUSCLE ONCE FOR ALLERGIC REACTION 2 each 3  . Levonorgestrel (SKYLA) 13.5 MG IUD by Intrauterine route.    Marland Kitchen MAGNESIUM PO Take by mouth as needed.    . methylphenidate (RITALIN) 10 MG tablet Take 1 tablet (10 mg total) by mouth 3 (three) times daily with meals. 90 tablet 0  . [START ON 03/20/2020] methylphenidate (RITALIN) 10 MG tablet Take 1 tablet (10 mg total) by mouth 3 (three) times daily with meals. 90 tablet 0  . [START ON 04/20/2020] methylphenidate (RITALIN) 10 MG tablet Take 1 tablet (10 mg total) by mouth 3 (three) times daily with meals. 90 tablet 0  . potassium chloride (K-DUR) 10 MEQ tablet     . QVAR REDIHALER 80 MCG/ACT inhaler INHALE 2 PUFFS INTO LUNGS TWICE A DAY 10.6 g 2  . RABEprazole (ACIPHEX) 20 MG tablet Take 1 tablet (20 mg  total) by mouth daily. 30 tablet 5  . rosuvastatin (CRESTOR) 10 MG tablet Take 10 mg by mouth.    . SUMAtriptan (IMITREX) 50 MG tablet Take by mouth.     No current facility-administered medications for this visit.   Allergies  Allergen Reactions  . Bactrim Anaphylaxis  . Bee Venom Hives  . Chocolate Flavor Anaphylaxis  . Cymbalta [Duloxetine Hcl] Shortness Of Breath  . Other Hives    Insect stings Wine spirit  . Aspartame Other (See Comments)  .  Coconut Oil Other (See Comments)  . Cranberry Extract Other (See Comments)  . Sulfur Itching  . Adhesive [Tape]   . Ciprofloxacin   . Doxycycline     Possibly joint pain or itch   . Erythromycin     Upset stomach  . Hydrocodone-Homatropine     Not true allergy. "irritated"  . Levofloxacin     Weakness, myalgia  . Lorazepam Other (See Comments)    Hallucination  . Meloxicam Itching  . Naproxen Sodium Itching  . Omeprazole-Sodium Bicarbonate     Chest pain-sore all over-HA  . Ranitidine     Severe joint pain  . Robaxin [Methocarbamol]     Made her very tired and unable to sleep at night  . Tetracyclines & Related   . Tizanidine     "knocked her out"- had a rebound migraine  . Triamcinolone   . Acetaminophen Rash  . Etodolac Rash  . Neosporin [Neomycin-Bacitracin Zn-Polymyx] Rash     Review of Systems: All systems reviewed and negative except where noted in HPI.   Recent labs reviewed in care everywhere, normal CBC, normal c-Met.  Physical Exam: BP 106/72   Pulse 76   Ht 5' 3.5" (1.613 m)   Wt 176 lb 4 oz (79.9 kg)   BMI 30.73 kg/m  Constitutional: Pleasant,well-developed, female in no acute distress. HEENT: Normocephalic and atraumatic. Conjunctivae are normal. No scleral icterus. Neck supple.  Cardiovascular: Normal rate, regular rhythm.  Pulmonary/chest: Effort normal and breath sounds normal.  Abdominal: Soft, nondistended, nontender. There are no masses palpable.  Extremities: no edema Lymphadenopathy:  No cervical adenopathy noted. Neurological: Alert and oriented to person place and time. Skin: Skin is warm and dry. No rashes noted. Psychiatric: Normal mood and affect. Behavior is normal.   ASSESSMENT AND PLAN: 49 year old female here for new patient assessment of the following:  GERD - as above, longstanding history of typical reflux symptoms, also possibly associated with her chronic cough and asthma.  She does have benefit with antacids when she takes them, however she has significant intolerance of every H2 blocker and PPI she has ever been on.  Symptoms sound more like intolerance than true allergy however she feels quite poorly and cannot continue a long-term regimen on them.  We discussed options.  She has not been on Protonix however I suspect she will have a similar reaction to other PPIs she is been on in the past.  We discussed other options, namely surgery or endoscopic therapy such as TIF if she wished to consider nonmedical therapy of her reflux.  She is quite interested in TIF after discussing this option as a way to manage her reflux without medications.  I offered her upper endoscopy in this light to reassess her upper tract, assess for hiatal hernia and assess Hill grade in her candidacy for possible TIF.  Educational handout provided about TIF in case she is interested in pursuing this pending results of EGD, I will refer her to Dr. Bryan Lemma for further evaluation.  In the interim she does tolerate Gaviscon and it helps her, she recommend she take this a few times a day as needed.  Discussed risk and benefits of EGD and anesthesia and she wanted to proceed.  Chronic constipation / history of colon polyps - reported chronic IBS C.  History of colon polyps for which she was told she is overdue for colonoscopy.  I offered her a colonoscopy to be done at the same time as her upper endoscopy.  I discussed risks and benefits of colonoscopy and anesthesia with her and she want to proceed.   Further recommendations pending the results.  Coggon Cellar, MD [facility]  CC: Thomes Dinning, MD

## 2020-03-19 NOTE — Patient Instructions (Addendum)
If you are age 49 or older, your body mass index should be between 23-30. Your Body mass index is 30.73 kg/m. If this is out of the aforementioned range listed, please consider follow up with your Primary Care Provider.  If you are age 32 or younger, your body mass index should be between 19-25. Your Body mass index is 30.73 kg/m. If this is out of the aformentioned range listed, please consider follow up with your Primary Care Provider.   You have been scheduled for an endoscopy and colonoscopy. Please follow the written instructions given to you at your visit today. Please pick up your prep supplies at the pharmacy within the next 1-3 days. If you use inhalers (even only as needed), please bring them with you on the day of your procedure.  You can take Gaviscon, over-the-counter, as needed for gerd.  Continue Miralax.  We are giving you a TIF handout today to review.  Thank you for entrusting me with your care and for choosing [facility], Dr. Gorman Cellar

## 2020-03-25 ENCOUNTER — Telehealth: Payer: Self-pay | Admitting: Allergy and Immunology

## 2020-03-25 ENCOUNTER — Telehealth: Payer: Self-pay | Admitting: Gastroenterology

## 2020-03-25 MED ORDER — ALVESCO 80 MCG/ACT IN AERS: INHALATION_SPRAY | RESPIRATORY_TRACT | 5 refills | Status: DC

## 2020-03-25 NOTE — Telephone Encounter (Signed)
Patient called and said that she would stay on her Qvar because she can not afford the alvesco. She has the qvar on hand now. [phone]

## 2020-03-25 NOTE — Telephone Encounter (Signed)
Called pharmacy and asked them to run the codes provided. They will call the patient and let her know it will be $40.

## 2020-03-25 NOTE — Telephone Encounter (Signed)
Alvesco was sent to local [facility] in [place]. Patient was advise we sent this to the [facility] in [place]. Will leave some samples

## 2020-03-25 NOTE — Telephone Encounter (Signed)
Pt is requesting a call back from someone regarding her SUTAB prep, Pt states the insurance is not willing to approve this and this comes out for the pt to be $181.

## 2020-03-26 ENCOUNTER — Telehealth: Payer: Self-pay | Admitting: Gastroenterology

## 2020-03-26 NOTE — Telephone Encounter (Signed)
Did you leave samples for patient?

## 2020-03-26 NOTE — Telephone Encounter (Signed)
Patient questioned the potassium in the sutab prep. She indicates that she takes potassium and that she actually gets sick if she takes her potassium on an empty stomach. I advised that the prep has some potassium in it due to the fact that she will be losing electrolytes from having frequent bowel movements over a short period of time. In addition, she wants to know if she should hold her potassium supplement since she cannot eat. I explained that I CANNOT tell her to stop her potassium supplement as we did not prescribe this. She tells me that she has actually be passing her entire potassium tablet through her bowel movements for some time now but forgot to tell Dr Havery Moros. She also goes on to tell me that she sometimes has difficulty swallowing pills. She has already purchased Sutab and may be okay with this but isnt sure after looking at how large the tablets are. We have contacted our Plenvu drug rep and he will bring a plenvu kit by the office tomorrow so we can provide this for patient.

## 2020-03-27 ENCOUNTER — Other Ambulatory Visit: Payer: Self-pay

## 2020-03-27 NOTE — Telephone Encounter (Signed)
I spoke with Judson Roch at [facility]. She verified stock of Alvesco as well as savings card. She pulled the prescription back to them and the prescription will be 50.00 per fill for the patient. She is reaching out to the patient now to set up delivery.

## 2020-03-27 NOTE — Telephone Encounter (Signed)
Called and spoke to pt and let her know that we got a Plenvu sample for her. She indicated she would rather do that than take the tablets (Sutab). She will pick up the sample and new instructions on Monday.  She indicated she has been constipated. I encouraged her to increase Miralax to twice a day and may go to three times a day as needed to get her bowels moving. She expressed understanding

## 2020-03-27 NOTE — Progress Notes (Signed)
error 

## 2020-03-27 NOTE — Telephone Encounter (Signed)
Patient called to inform that she is switching her pharmacy back to [facility] because she has had so much trouble with the other [facility]. Patient states that [facility] states they will order the medication, but it will be $330 and they are not sure if any discount or coupons can be used. Patient would like to know if she can just continue on her Qvar since she has that on hand.   Patient informed that samples of Alvesco were in the [facility] office and she states her husband may be able to pick them up today but she was not sure.

## 2020-03-31 ENCOUNTER — Encounter: Payer: Self-pay | Admitting: Gastroenterology

## 2020-03-31 ENCOUNTER — Other Ambulatory Visit: Payer: Self-pay

## 2020-03-31 ENCOUNTER — Ambulatory Visit (AMBULATORY_SURGERY_CENTER): Payer: No Typology Code available for payment source | Admitting: Gastroenterology

## 2020-03-31 VITALS — BP 101/62 | HR 53 | Temp 96.8°F | Resp 13 | Ht 63.5 in | Wt 176.0 lb

## 2020-03-31 DIAGNOSIS — D122 Benign neoplasm of ascending colon: Secondary | ICD-10-CM | POA: Diagnosis not present

## 2020-03-31 DIAGNOSIS — Z86010 Personal history of colonic polyps: Secondary | ICD-10-CM

## 2020-03-31 DIAGNOSIS — D120 Benign neoplasm of cecum: Secondary | ICD-10-CM

## 2020-03-31 DIAGNOSIS — K219 Gastro-esophageal reflux disease without esophagitis: Secondary | ICD-10-CM | POA: Diagnosis present

## 2020-03-31 DIAGNOSIS — K208 Other esophagitis without bleeding: Secondary | ICD-10-CM | POA: Diagnosis not present

## 2020-03-31 DIAGNOSIS — D128 Benign neoplasm of rectum: Secondary | ICD-10-CM

## 2020-03-31 DIAGNOSIS — K3189 Other diseases of stomach and duodenum: Secondary | ICD-10-CM | POA: Diagnosis not present

## 2020-03-31 MED ORDER — SODIUM CHLORIDE 0.9 % IV SOLN: 500.0000 mL | Freq: Once | INTRAVENOUS | Status: DC

## 2020-03-31 NOTE — Progress Notes (Signed)
pt tolerated well. VSS. awake and to recovery. Report given to RN. Bite block inserted and removed without trauma. 

## 2020-03-31 NOTE — Patient Instructions (Addendum)
Upper endoscopy:   Handouts on gastritis & esophagitis  given to you  Await biopsy results on biopsies and on duodenal polyp removed    Colonoscopy:   Handout on polyps & hemorrhoids given to you   Await pathology results on polyps removed    YOU HAD AN ENDOSCOPIC PROCEDURE TODAY AT [facility]:   Refer to the procedure report that was given to you for any specific questions about what was found during the examination.  If the procedure report does not answer your questions, please call your gastroenterologist to clarify.  If you requested that your care partner not be given the details of your procedure findings, then the procedure report has been included in a sealed envelope for you to review at your convenience later.  YOU SHOULD EXPECT: Some feelings of bloating in the abdomen. Passage of more gas than usual.  Walking can help get rid of the air that was put into your GI tract during the procedure and reduce the bloating. If you had a lower endoscopy (such as a colonoscopy or flexible sigmoidoscopy) you may notice spotting of blood in your stool or on the toilet paper. If you underwent a bowel prep for your procedure, you may not have a normal bowel movement for a few days.  Please Note:  You might notice some irritation and congestion in your nose or some drainage.  This is from the oxygen used during your procedure.  There is no need for concern and it should clear up in a day or so.  SYMPTOMS TO REPORT IMMEDIATELY:   Following lower endoscopy (colonoscopy or flexible sigmoidoscopy):  Excessive amounts of blood in the stool  Significant tenderness or worsening of abdominal pains  Swelling of the abdomen that is new, acute  Fever of 100F or higher   Following upper endoscopy (EGD)  Vomiting of blood or coffee ground material  New chest pain or pain under the shoulder blades  Painful or persistently difficult swallowing  New shortness of breath  Fever of  100F or higher  Black, tarry-looking stools  For urgent or emergent issues, a gastroenterologist can be reached at any hour by calling [phone]. Do not use MyChart messaging for urgent concerns.    DIET:  We do recommend a small meal at first, but then you may proceed to your regular diet.  Drink plenty of fluids but you should avoid alcoholic beverages for 24 hours.  ACTIVITY:  You should plan to take it easy for the rest of today and you should NOT DRIVE or use heavy machinery until tomorrow (because of the sedation medicines used during the test).    FOLLOW UP: Our staff will call the number listed on your records 48-72 hours following your procedure to check on you and address any questions or concerns that you may have regarding the information given to you following your procedure. If we do not reach you, we will leave a message.  We will attempt to reach you two times.  During this call, we will ask if you have developed any symptoms of COVID 19. If you develop any symptoms (ie: fever, flu-like symptoms, shortness of breath, cough etc.) before then, please call [phone].  If you test positive for Covid 19 in the 2 weeks post procedure, please call and report this information to [place].    If any biopsies were taken you will be contacted by phone or by letter within the next 1-3 weeks.  Please call  us at [phone] if you have not heard about the biopsies in 3 weeks.    SIGNATURES/CONFIDENTIALITY: You and/or your care partner have signed paperwork which will be entered into your electronic medical record.  These signatures attest to the fact that that the information above on your After Visit Summary has been reviewed and is understood.  Full responsibility of the confidentiality of this discharge information lies with you and/or your care-partner.

## 2020-03-31 NOTE — Progress Notes (Signed)
Called to room to assist during endoscopic procedure.  Patient ID and intended procedure confirmed with present staff. Received instructions for my participation in the procedure from the performing physician.  

## 2020-03-31 NOTE — Op Note (Addendum)
[facility] Patient Name: Samantha [name] Procedure Date: 03/31/2020 9:47 AM MRN: [id number] Endoscopist: Remo Lipps P. Havery Moros , MD Age: 49 Referring MD:  Date of Birth: Jun 12, 1971 Gender: Female Account #: [id number] Procedure:                Colonoscopy Indications:              High risk colon cancer surveillance: Personal                            history of colonic polyps (2016 SSP removed) Medicines:                Monitored Anesthesia Care Procedure:                Pre-Anesthesia Assessment:                           - Prior to the procedure, a History and Physical                            was performed, and patient medications and                            allergies were reviewed. The patient's tolerance of                            previous anesthesia was also reviewed. The risks                            and benefits of the procedure and the sedation                            options and risks were discussed with the patient.                            All questions were answered, and informed consent                            was obtained. Prior Anticoagulants: The patient has                            taken no previous anticoagulant or antiplatelet                            agents. ASA Grade Assessment: III - A patient with                            severe systemic disease. After reviewing the risks                            and benefits, the patient was deemed in                            satisfactory condition to undergo the procedure.  After obtaining informed consent, the colonoscope                            was passed under direct vision. Throughout the                            procedure, the patient's blood pressure, pulse, and                            oxygen saturations were monitored continuously. The                            Colonoscope was introduced through the anus and                             advanced to the the cecum, identified by                            appendiceal orifice and ileocecal valve. The                            colonoscopy was performed without difficulty. The                            patient tolerated the procedure well. The quality                            of the bowel preparation was good. The ileocecal                            valve, appendiceal orifice, and rectum were                            photographed. Scope In: 10:13:11 AM Scope Out: 10:34:43 AM Scope Withdrawal Time: 0 hours 18 minutes 57 seconds  Total Procedure Duration: 0 hours 21 minutes 32 seconds  Findings:                 The perianal and digital rectal examinations were                            normal.                           A 3 mm polyp was found in the cecum. The polyp was                            sessile. The polyp was removed with a cold snare.                            Resection and retrieval were complete.                           A 4 mm polyp was found in the ascending colon. The  polyp was sessile. The polyp was removed with a                            cold snare. Resection and retrieval were complete.                           A 4 mm polyp was found in the rectum. The polyp was                            sessile. The polyp was removed with a cold snare.                            Resection was complete however for some reason was                            lost and could not be found in the polyp trap.                           Internal hemorrhoids were found during                            retroflexion. The hemorrhoids were small.                           The exam was otherwise without abnormality. Complications:            No immediate complications. Estimated blood loss:                            Minimal. Estimated Blood Loss:     Estimated blood loss was minimal. Impression:               - One 3 mm polyp in the cecum, removed  with a cold                            snare. Resected and retrieved.                           - One 4 mm polyp in the ascending colon, removed                            with a cold snare. Resected and retrieved.                           - One 4 mm polyp in the rectum, removed with a cold                            snare. Resected but not retrieved, would treat as                            if it is an adenoma given it's appearance                           -  Internal hemorrhoids.                           - The examination was otherwise normal. Recommendation:           - Patient has a contact number available for                            emergencies. The signs and symptoms of potential                            delayed complications were discussed with the                            patient. Return to normal activities tomorrow.                            Written discharge instructions were provided to the                            patient.                           - Resume previous diet.                           - Continue present medications.                           - Await pathology results. Remo Lipps P. Havery Moros, MD 03/31/2020 10:38:49 AM This report has been signed electronically.

## 2020-03-31 NOTE — Op Note (Addendum)
[facility] Patient Name: Samantha [name] Procedure Date: 03/31/2020 9:52 AM MRN: [id number] Endoscopist: Remo Lipps P. Havery Moros , MD Age: 49 Referring MD:  Date of Birth: [birth year] Gender: Female Account #: [id number] Procedure:                Upper GI endoscopy Indications:              Follow-up of gastro-esophageal reflux disease -                            multiple medication allergies, intolerant of all H2                            blockers and PPIs - assess candidacy for TIF Medicines:                Monitored Anesthesia Care Procedure:                Pre-Anesthesia Assessment:                           - Prior to the procedure, a History and Physical                            was performed, and patient medications and                            allergies were reviewed. The patient's tolerance of                            previous anesthesia was also reviewed. The risks                            and benefits of the procedure and the sedation                            options and risks were discussed with the patient.                            All questions were answered, and informed consent                            was obtained. Prior Anticoagulants: The patient has                            taken no previous anticoagulant or antiplatelet                            agents. ASA Grade Assessment: III - A patient with                            severe systemic disease. After reviewing the risks                            and benefits, the patient was deemed in  satisfactory condition to undergo the procedure.                           After obtaining informed consent, the endoscope was                            passed under direct vision. Throughout the                            procedure, the patient's blood pressure, pulse, and                            oxygen saturations were monitored continuously. The                             Endoscope was introduced through the mouth, and                            advanced to the second part of duodenum. The upper                            GI endoscopy was accomplished without difficulty.                            The patient tolerated the procedure well. Scope In: Scope Out: Findings:                 Esophagogastric landmarks were identified: the                            Z-line was found at 35 cm, the gastroesophageal                            junction was found at 35 cm and the upper extent of                            the gastric folds was found at 35 cm from the                            incisors.                           LA Grade A esophagitis was found 35 cm from the                            incisors.                           The exam of the esophagus was otherwise normal.                           Biopsies were taken with a cold forceps in the  upper third of the esophagus, in the middle third                            of the esophagus and in the lower third of the                            esophagus for histology to ensure no evidence of                            EoE, assess for objective histologic evidence of                            GERD.                           Patchy mild inflammation characterized by erosions,                            erythema and friability was found in the gastric                            antrum, no ulcerations noted. Biopsies were taken                            with a cold forceps for histology from the antrum,                            body, and incisura.                           The exam of the stomach was otherwise normal. Hill                            Grade I views of the cardia noted.                           A single diminutive sessile polyp was found in the                            duodenal bulb. The polyp was removed with a cold                            biopsy  forceps. Resection and retrieval were                            complete.                           The exam of the duodenum was otherwise normal. Complications:            No immediate complications. Estimated blood loss:                            Minimal. Estimated Blood Loss:     Estimated  blood loss was minimal. Impression:               - Esophagogastric landmarks identified.                           - LA Grade A esophagitis.                           - Normal esophagus otherwise, biopsies obtained                           - Mild gastritis. Biopsied.                           - Normal stomach otherwise                           - A single duodenal polyp. Resected and retrieved.                           - Normal duodenum otherwise.                           Patient is a candidate for TIF based on this exam Recommendation:           - Patient has a contact number available for                            emergencies. The signs and symptoms of potential                            delayed complications were discussed with the                            patient. Return to normal activities tomorrow.                            Written discharge instructions were provided to the                            patient.                           - Resume previous diet.                           - Continue present medications.                           - Await pathology results.                           - Avoid NSAIDs                           - Consideration for TIF, will discuss with the  patient referral to Dr. Bryan Lemma for TIF                            evaluation Carlota Raspberry. Havery Moros, MD 03/31/2020 10:48:24 AM This report has been signed electronically.

## 2020-04-02 ENCOUNTER — Telehealth: Payer: Self-pay

## 2020-04-02 NOTE — Telephone Encounter (Signed)
  Follow up Call-  Call back number 03/31/2020  Post procedure Call Back phone  # [phone]  Permission to leave phone message Yes  Some recent data might be hidden     Patient questions:  Do you have a fever, pain , or abdominal swelling? Minimal pain, better than yesterday per patient  Pain Score  2  Have you tolerated food without any problems? Yes.    Have you been able to return to your normal activities? Yes.    Do you have any questions about your discharge instructions: Diet   No. Medications  No. Follow up visit  No.  Do you have questions or concerns about your Care? No.  Actions: * If pain score is 4 or above: No action needed, pain <4.  1. Have you developed a fever since your procedure? no  2.   Have you had an respiratory symptoms (SOB or cough) since your procedure? no  3.   Have you tested positive for COVID 19 since your procedure no  4.   Have you had any family members/close contacts diagnosed with the COVID 19 since your procedure?  no   If yes to any of these questions please route to Joylene John, RN and Erenest Rasher, RN

## 2020-04-07 ENCOUNTER — Other Ambulatory Visit: Payer: Self-pay

## 2020-04-07 ENCOUNTER — Telehealth: Payer: Self-pay

## 2020-04-07 DIAGNOSIS — R1031 Right lower quadrant pain: Secondary | ICD-10-CM

## 2020-04-07 NOTE — Telephone Encounter (Signed)
Called and spoke to pt to give results. She has been in pain since last Tuesday after her procedure. Pain is mainly in her RLQ and worsening. She had a temp of 100. Today she is feeling nauseous and not eating but is drinking to stay hydrated. The pain radiates from her back to front. It is painful to the touch and her abdomen is warm. She said her pain is worse after she eats so she's not eating much. She has an appointment with her PCP tomorrow afternoon. Regarding TIF she is not interested because she read the brochure and there is "no way she could recover for 6 weeks". She wants to know what else she can try for reflux. We reviewed that she has tried a lot of medications already. She recently tried to restart Rabeprazole and it caused pain in her legs.

## 2020-04-07 NOTE — Progress Notes (Signed)
Per Dr. Havery Moros, he would like for pt to have labs: cbc, cmet and lipase. Called and spoke to pt.  She will go to the lab in the morning.

## 2020-04-08 ENCOUNTER — Other Ambulatory Visit (INDEPENDENT_AMBULATORY_CARE_PROVIDER_SITE_OTHER): Payer: No Typology Code available for payment source

## 2020-04-08 DIAGNOSIS — R1031 Right lower quadrant pain: Secondary | ICD-10-CM

## 2020-04-08 LAB — CBC WITH DIFFERENTIAL/PLATELET
Basophils Absolute: 0 10*3/uL (ref 0.0–0.1)
Basophils Relative: 0.5 % (ref 0.0–3.0)
Eosinophils Absolute: 0.1 10*3/uL (ref 0.0–0.7)
Eosinophils Relative: 0.9 % (ref 0.0–5.0)
HCT: 38.9 % (ref 36.0–46.0)
Hemoglobin: 13.2 g/dL (ref 12.0–15.0)
Lymphocytes Relative: 44 % (ref 12.0–46.0)
Lymphs Abs: 2.7 10*3/uL (ref 0.7–4.0)
MCHC: 33.9 g/dL (ref 30.0–36.0)
MCV: 89 fl (ref 78.0–100.0)
Monocytes Absolute: 0.3 10*3/uL (ref 0.1–1.0)
Monocytes Relative: 5.3 % (ref 3.0–12.0)
Neutro Abs: 3 10*3/uL (ref 1.4–7.7)
Neutrophils Relative %: 49.3 % (ref 43.0–77.0)
Platelets: 203 10*3/uL (ref 150.0–400.0)
RBC: 4.37 Mil/uL (ref 3.87–5.11)
RDW: 13.7 % (ref 11.5–15.5)
WBC: 6.1 10*3/uL (ref 4.0–10.5)

## 2020-04-08 LAB — COMPREHENSIVE METABOLIC PANEL
ALT: 18 U/L (ref 0–35)
AST: 17 U/L (ref 0–37)
Albumin: 4.2 g/dL (ref 3.5–5.2)
Alkaline Phosphatase: 77 U/L (ref 39–117)
BUN: 16 mg/dL (ref 6–23)
CO2: 34 mEq/L — ABNORMAL HIGH (ref 19–32)
Calcium: 9.9 mg/dL (ref 8.4–10.5)
Chloride: 99 mEq/L (ref 96–112)
Creatinine, Ser: 1.02 mg/dL (ref 0.40–1.20)
GFR: 57.59 mL/min — ABNORMAL LOW (ref >60.00)
Glucose, Bld: 161 mg/dL — ABNORMAL HIGH (ref 70–99)
Potassium: 3.5 mEq/L (ref 3.5–5.1)
Sodium: 139 mEq/L (ref 135–145)
Total Bilirubin: 0.4 mg/dL (ref 0.2–1.2)
Total Protein: 7 g/dL (ref 6.0–8.3)

## 2020-04-08 LAB — LIPASE: Lipase: 5 U/L — ABNORMAL LOW (ref 11.0–59.0)

## 2020-04-09 ENCOUNTER — Other Ambulatory Visit: Payer: Self-pay

## 2020-04-09 DIAGNOSIS — R1031 Right lower quadrant pain: Secondary | ICD-10-CM

## 2020-04-15 ENCOUNTER — Ambulatory Visit (HOSPITAL_COMMUNITY): Payer: No Typology Code available for payment source

## 2020-06-04 ENCOUNTER — Telehealth (INDEPENDENT_AMBULATORY_CARE_PROVIDER_SITE_OTHER): Payer: No Typology Code available for payment source | Admitting: Psychiatry

## 2020-06-04 ENCOUNTER — Other Ambulatory Visit: Payer: Self-pay

## 2020-06-04 DIAGNOSIS — F908 Attention-deficit hyperactivity disorder, other type: Secondary | ICD-10-CM | POA: Diagnosis not present

## 2020-06-04 DIAGNOSIS — F4312 Post-traumatic stress disorder, chronic: Secondary | ICD-10-CM | POA: Diagnosis not present

## 2020-06-04 DIAGNOSIS — F4001 Agoraphobia with panic disorder: Secondary | ICD-10-CM

## 2020-06-04 MED ORDER — METHYLPHENIDATE HCL 10 MG PO TABS: 10.0000 mg | ORAL_TABLET | Freq: Three times a day (TID) | ORAL | 0 refills | Status: DC

## 2020-06-04 MED ORDER — DIAZEPAM 10 MG PO TABS: 10.0000 mg | ORAL_TABLET | Freq: Every evening | ORAL | 2 refills | Status: DC | PRN

## 2020-06-04 MED ORDER — ALPRAZOLAM 1 MG PO TABS: 1.0000 mg | ORAL_TABLET | Freq: Two times a day (BID) | ORAL | 2 refills | Status: DC | PRN
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
## 2020-08-27 DIAGNOSIS — R7303 Prediabetes: Secondary | ICD-10-CM | POA: Insufficient documentation

## 2020-08-31 ENCOUNTER — Telehealth (INDEPENDENT_AMBULATORY_CARE_PROVIDER_SITE_OTHER): Payer: No Typology Code available for payment source | Admitting: Psychiatry

## 2020-08-31 ENCOUNTER — Other Ambulatory Visit: Payer: Self-pay

## 2020-08-31 DIAGNOSIS — F908 Attention-deficit hyperactivity disorder, other type: Secondary | ICD-10-CM | POA: Diagnosis not present

## 2020-08-31 DIAGNOSIS — F4001 Agoraphobia with panic disorder: Secondary | ICD-10-CM | POA: Diagnosis not present

## 2020-08-31 MED ORDER — METHYLPHENIDATE HCL 10 MG PO TABS: 10.0000 mg | ORAL_TABLET | Freq: Three times a day (TID) | ORAL | 0 refills | Status: DC

## 2020-08-31 MED ORDER — DIAZEPAM 10 MG PO TABS: 10.0000 mg | ORAL_TABLET | Freq: Every evening | ORAL | 2 refills | Status: AC | PRN

## 2020-08-31 MED ORDER — ALPRAZOLAM 1 MG PO TABS: 1.0000 mg | ORAL_TABLET | Freq: Two times a day (BID) | ORAL | 2 refills | Status: AC | PRN

## 2020-08-31 MED ORDER — METHYLPHENIDATE HCL 10 MG PO TABS
10.0000 mg | ORAL_TABLET | Freq: Three times a day (TID) | ORAL | 0 refills | Status: DC
Start: 2020-09-17 — End: 2020-11-30
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
## 2020-09-08 ENCOUNTER — Ambulatory Visit (INDEPENDENT_AMBULATORY_CARE_PROVIDER_SITE_OTHER): Payer: No Typology Code available for payment source | Admitting: Allergy and Immunology

## 2020-09-08 ENCOUNTER — Encounter: Payer: Self-pay | Admitting: Allergy and Immunology

## 2020-09-08 ENCOUNTER — Other Ambulatory Visit: Payer: Self-pay

## 2020-09-08 VITALS — BP 124/82 | HR 86 | Temp 97.8°F | Resp 16 | Ht 63.5 in | Wt 182.4 lb

## 2020-09-08 DIAGNOSIS — J3089 Other allergic rhinitis: Secondary | ICD-10-CM

## 2020-09-08 DIAGNOSIS — L500 Allergic urticaria: Secondary | ICD-10-CM | POA: Diagnosis not present

## 2020-09-08 DIAGNOSIS — J4530 Mild persistent asthma, uncomplicated: Secondary | ICD-10-CM

## 2020-09-08 DIAGNOSIS — Z91038 Other insect allergy status: Secondary | ICD-10-CM | POA: Diagnosis not present

## 2020-09-08 DIAGNOSIS — K219 Gastro-esophageal reflux disease without esophagitis: Secondary | ICD-10-CM

## 2020-09-08 MED ORDER — EPINEPHRINE 0.3 MG/0.3ML IJ SOAJ: INTRAMUSCULAR | 0 refills | Status: DC

## 2020-09-08 MED ORDER — EPINEPHRINE 0.3 MG/0.3ML IJ SOAJ: 0.3000 mg | Freq: Once | INTRAMUSCULAR | 1 refills | Status: AC

## 2020-09-08 MED ORDER — QVAR REDIHALER 80 MCG/ACT IN AERB: INHALATION_SPRAY | RESPIRATORY_TRACT | 3 refills | Status: DC

## 2020-09-08 NOTE — Patient Instructions (Addendum)
  1. Continue Qvar 80 - 2 inhalations 1-2 times per day depending on disease activity  2. Continue treatment of allergic rhinitis:   A. OTC Nasacort - 1 spray each nostril 1-2 times per day  B. Azelastine nasal spray - 1 sprays each nostril 1-2 times per day  3. Can continue over-the-counter antihistamine, Auvi-Q 0.3, Albuterol if needed  4. Return to clinic in 6 months or earlier if problem.

## 2020-09-08 NOTE — Progress Notes (Signed)
[facility]   Follow-up Note  Referring Provider: Thomes Dinning, MD Primary Provider: Thomes Dinning, MD Date of Office Visit: 09/08/2020  Subjective:   Samantha [name] (DOB: 1971/09/02) is a 49 y.o. female who returns to the [facility] on 09/08/2020 in re-evaluation of the following:  HPI: Samantha [name] returns to this clinic in reevaluation of asthma and allergic rhinitis, chronic urticaria, LPR, and history of hymenoptera venom hypersensitivity state.  Her last visit to this clinic was 10 March 2020.  She has done relatively well with her asthma, not requiring a systemic steroid to treat an exacerbation, and rare use of a short acting bronchodilator, while she continues on a inhaled steroid presently using Qvar.  Apparently her use of Alvesco resulted in a significantly inflamed tongue and she is now back on Qvar using it mostly 1 time per day.  She does inform me that she had an episode of upper respiratory tract infection in August and October.  She went to the urgent care center in August and was treated with prednisone and she went to the urgent care center in October and was treated with a Z-Pak.  Both of these issues were relatively short-lived having mostly resolved within 7 days of onset.  For the most part her nose does well while she remains on a nasal steroid and a nasal antihistamine spray.  Her reflux is active and she is intolerant of using most medications for reflux including proton pump inhibitors and H2 receptor blockers and it was recommended by her gastroenterologist that she use Gaviscon and consider a Nissen fundoplication.  She did have a "allergic reaction" with unknown etiologic factor on December 19 that required her to use an Auvi-Q.  She has some intermittent urticaria on and off which is not a particularly big issue or uncommon issue but while assembling lights on a Christmas tree  outdoors she developed coughing and then diffuse urticaria and then vomiting and a runny nose and she used her EpiPen and everything resolved within minutes.  She has received 3 Pfizer Covid vaccines.  She is very hesitant about using a flu vaccine because of her previous reaction in which she was sick for 2 days.  Allergies as of 09/08/2020      Reactions   Bactrim Anaphylaxis   Bee Venom Hives   Chocolate Flavor Anaphylaxis   Cymbalta [duloxetine Hcl] Shortness Of Breath   Other Hives   Insect stings Wine spirit   Aspartame Other (See Comments)   Coconut Oil Other (See Comments)   Cranberry Extract Other (See Comments)   Sulfur Itching   Adhesive [tape]    Ciprofloxacin    Doxycycline    Possibly joint pain or itch   Erythromycin    Upset stomach   Hydrocodone-homatropine    Not true allergy. "irritated"   Levofloxacin    Weakness, myalgia   Lorazepam Other (See Comments)   Hallucination   Meloxicam Itching   Naproxen Sodium Itching   Omeprazole-sodium Bicarbonate    Chest pain-sore all over-HA   Ranitidine    Severe joint pain   Robaxin [methocarbamol]    Made her very tired and unable to sleep at night   Tetracyclines & Related    Tizanidine    "knocked her out"- had a rebound migraine   Triamcinolone    Acetaminophen Rash   Etodolac Rash   Neosporin [neomycin-bacitracin Zn-polymyx] Rash      Medication List  albuterol 108 (90 Base) MCG/ACT inhaler Commonly known as: VENTOLIN HFA INHALE 2 PUFFS EVERY 4 HOURS AS NEEDED FOR WHEEZING   ALPRAZolam 1 MG tablet Commonly known as: XANAX Take 1 tablet (1 mg total) by mouth 2 (two) times daily as needed for anxiety. Start taking on: September 17, 2020   Alvesco 80 MCG/ACT inhaler Generic drug: ciclesonide Inhale 1 puff into the lungs 1-2 times daily depending on asthma activity. Rinse, gargle, and spit after use.   atenolol 25 MG tablet Commonly known as: TENORMIN Take 25 mg by mouth daily as needed.    atenolol-chlorthalidone 50-25 MG tablet Commonly known as: TENORETIC Take 1 tablet by mouth daily.   azelastine 0.1 % nasal spray Commonly known as: ASTELIN Place 1 spray into both nostrils every evening.   budesonide 32 MCG/ACT nasal spray Commonly known as: Rhinocort Aqua Place 1 spray into both nostrils daily as needed for rhinitis.   cholecalciferol 1000 units tablet Commonly known as: VITAMIN D Take by mouth Nightly.   diazepam 10 MG tablet Commonly known as: VALIUM Take 1 tablet (10 mg total) by mouth at bedtime as needed and may repeat dose one time if needed for sleep. Start taking on: September 17, 2020   EPINEPHrine 0.3 mg/0.3 mL Soaj injection Commonly known as: Auvi-Q Inject 0.3 mg into the muscle once for 1 dose. As directed for life-threatening allergic reactions   Levonorgestrel 13.5 MG Iud by Intrauterine route.   MAGNESIUM PO Take by mouth as needed.   methylphenidate 10 MG tablet Commonly known as: Ritalin Take 1 tablet (10 mg total) by mouth 3 (three) times daily with meals.   methylphenidate 10 MG tablet Commonly known as: Ritalin Take 1 tablet (10 mg total) by mouth 3 (three) times daily with meals.   polyethylene glycol 17 g packet Commonly known as: MiraLax Take 17 g by mouth daily.   potassium chloride 10 MEQ tablet Commonly known as: KLOR-CON   Qvar RediHaler 80 MCG/ACT inhaler Generic drug: beclomethasone INHALE 2 PUFFS INTO LUNGS TWICE A DAY   RABEprazole 20 MG tablet Commonly known as: ACIPHEX Take 1 tablet (20 mg total) by mouth daily.   rosuvastatin 10 MG tablet Commonly known as: CRESTOR Take 10 mg by mouth.   SUMAtriptan 50 MG tablet Commonly known as: IMITREX Take by mouth.       Past Medical History:  Diagnosis Date  . ADD (attention deficit disorder)   . Allergy   . Arthritis   . Asthma   . Chronic back pain   . Chronic neck pain   . Endometriosis   . Fibromyalgia   . GERD (gastroesophageal reflux disease)    . Hernia   . Hypertension   . IBS (irritable bowel syndrome)   . IC (interstitial cystitis)   . Multiple allergies   . OCD (obsessive compulsive disorder)   . Raynaud disease   . Vertigo     Past Surgical History:  Procedure Laterality Date  . BLADDER SURGERY    . CERVICAL FUSION    . CESAREAN SECTION    . COLONOSCOPY    . HAND SURGERY    . LESION REMOVAL  08/23/2012   Procedure: LESION REMOVAL HAND;  Surgeon: Cammie Sickle., MD;  Location: [facility];  Service: Orthopedics;  Laterality: Left;  . ROTATOR CUFF REPAIR    . UPPER GASTROINTESTINAL ENDOSCOPY      Review of systems negative except as noted in HPI / PMHx or noted below:  Review of Systems  Constitutional: Negative.   HENT: Negative.   Eyes: Negative.   Respiratory: Negative.   Cardiovascular: Negative.   Gastrointestinal: Negative.   Genitourinary: Negative.   Musculoskeletal: Negative.   Skin: Negative.   Neurological: Negative.   Endo/Heme/Allergies: Negative.   Psychiatric/Behavioral: Negative.      Objective:   Vitals:   09/08/20 1033  BP: 124/82  Pulse: 86  Resp: 16  Temp: 97.8 F (36.6 C)  SpO2: 96%   Height: 5' 3.5" (161.3 cm)  Weight: 182 lb 6.4 oz (82.7 kg)   Physical Exam Constitutional:      Appearance: She is not diaphoretic.  HENT:     Head: Normocephalic.     Right Ear: Tympanic membrane, ear canal and external ear normal.     Left Ear: Tympanic membrane, ear canal and external ear normal.     Nose: Nose normal. No mucosal edema or rhinorrhea.     Mouth/Throat:     Mouth: Oropharynx is clear and moist and mucous membranes are normal.     Pharynx: Uvula midline. No oropharyngeal exudate.  Eyes:     Conjunctiva/sclera: Conjunctivae normal.  Neck:     Thyroid: No thyromegaly.     Trachea: Trachea normal. No tracheal tenderness or tracheal deviation.  Cardiovascular:     Rate and Rhythm: Normal rate and regular rhythm.     Heart sounds: Normal heart  sounds, S1 normal and S2 normal. No murmur heard.   Pulmonary:     Effort: No respiratory distress.     Breath sounds: Normal breath sounds. No stridor. No wheezing or rales.  Musculoskeletal:        General: No edema.  Lymphadenopathy:     Head:     Right side of head: No tonsillar adenopathy.     Left side of head: No tonsillar adenopathy.     Cervical: No cervical adenopathy.  Skin:    Findings: No erythema or rash.     Nails: There is no clubbing.  Neurological:     Mental Status: She is alert.     Diagnostics:    Spirometry was performed and demonstrated an FEV1 of 2.09 at 77 % of predicted.   Assessment and Plan:   1. Asthma, well controlled, mild persistent   2. Allergic urticaria   3. Other allergic rhinitis   4. 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 Gastroesophageal reflux disease, unspecified whether esophagitis present      1. Continue Qvar 80 - 2 inhalations 1-2 times per day depending on disease activity  2. Continue treatment of allergic rhinitis:   A. OTC Nasacort - 1 spray each nostril 1-2 times per day  B. Azelastine nasal spray - 1 sprays each nostril 1-2 times per day  3. Can continue over-the-counter antihistamine, Auvi-Q 0.3, Albuterol if needed  4. Return to clinic in 6 months or earlier if problem.    Boykin Reaper appears to be doing okay on her current plan of anti-inflammatory agents for her airway.  Her reflux is a problem but that needs to be managed by her gastroenterologist who has offered her the option of Nissen fundoplication given the fact that she is intolerant of using all other medications directed against this disease process.  She is remaining away from hymenoptera venom hypersensitivity and based upon her evaluation performed in the past that is really her only option at this point.  I will see her back in this clinic in 6 months or earlier if  there is a problem.  Allena Katz, MD Allergy / Immunology [place]

## 2020-09-09 ENCOUNTER — Telehealth: Payer: Self-pay | Admitting: Allergy and Immunology

## 2020-09-09 ENCOUNTER — Encounter: Payer: Self-pay | Admitting: Allergy and Immunology

## 2020-09-09 NOTE — Telephone Encounter (Signed)
[facility] called regarding prescription for Auvi-Q. Pharmacy states patient has an allergy to antibiotics and they would like to clarify. Please call back at [phone].

## 2020-09-10 ENCOUNTER — Other Ambulatory Visit: Payer: Self-pay

## 2020-09-10 MED ORDER — EPINEPHRINE 0.3 MG/0.3ML IJ SOAJ: 0.3000 mg | INTRAMUSCULAR | 1 refills | Status: DC | PRN

## 2020-09-10 NOTE — Telephone Encounter (Signed)
[facility] is where the auvi-q rx I suppose to go so sen rx to [facility]

## 2020-11-30 ENCOUNTER — Telehealth (INDEPENDENT_AMBULATORY_CARE_PROVIDER_SITE_OTHER): Payer: No Typology Code available for payment source | Admitting: Psychiatry

## 2020-11-30 ENCOUNTER — Other Ambulatory Visit: Payer: Self-pay

## 2020-11-30 DIAGNOSIS — F902 Attention-deficit hyperactivity disorder, combined type: Secondary | ICD-10-CM

## 2020-11-30 DIAGNOSIS — F4001 Agoraphobia with panic disorder: Secondary | ICD-10-CM

## 2020-11-30 MED ORDER — METHYLPHENIDATE HCL 10 MG PO TABS: 10.0000 mg | ORAL_TABLET | Freq: Three times a day (TID) | ORAL | 0 refills | Status: DC
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
## 2021-03-09 ENCOUNTER — Other Ambulatory Visit: Payer: Self-pay

## 2021-03-09 ENCOUNTER — Ambulatory Visit (INDEPENDENT_AMBULATORY_CARE_PROVIDER_SITE_OTHER): Payer: No Typology Code available for payment source | Admitting: Allergy and Immunology

## 2021-03-09 VITALS — BP 130/78 | HR 65 | Temp 98.3°F | Resp 16 | Ht 63.0 in | Wt 172.4 lb

## 2021-03-09 DIAGNOSIS — L500 Allergic urticaria: Secondary | ICD-10-CM

## 2021-03-09 DIAGNOSIS — J4550 Severe persistent asthma, uncomplicated: Secondary | ICD-10-CM

## 2021-03-09 DIAGNOSIS — K219 Gastro-esophageal reflux disease without esophagitis: Secondary | ICD-10-CM

## 2021-03-09 DIAGNOSIS — J3089 Other allergic rhinitis: Secondary | ICD-10-CM

## 2021-03-09 MED ORDER — EPINEPHRINE 0.3 MG/0.3ML IJ SOAJ: 0.3000 mg | INTRAMUSCULAR | 1 refills | Status: DC | PRN

## 2021-03-09 MED ORDER — QVAR REDIHALER 80 MCG/ACT IN AERB: INHALATION_SPRAY | RESPIRATORY_TRACT | 1 refills | Status: DC

## 2021-03-09 MED ORDER — ALBUTEROL SULFATE HFA 108 (90 BASE) MCG/ACT IN AERS: INHALATION_SPRAY | RESPIRATORY_TRACT | 1 refills | Status: DC

## 2021-03-09 MED ORDER — AZELASTINE HCL 0.1 % NA SOLN: 1.0000 | Freq: Every evening | NASAL | 3 refills | Status: DC

## 2021-03-09 MED ORDER — TRIAMCINOLONE ACETONIDE 55 MCG/ACT NA AERO: 2.0000 | INHALATION_SPRAY | Freq: Every day | NASAL | 5 refills | Status: DC

## 2021-03-09 NOTE — Patient Instructions (Addendum)
  1. Continue treatment of asthma:   A. Qvar 80 - 2 inhalations 1-2 times per day depending on disease activity B. Submit for tezepelumab administration  2. Continue treatment of allergic rhinitis:   A. OTC Nasacort - 1 spray each nostril 1-2 times per day  B. Azelastine nasal spray - 1 spray each nostril 1-2 times per day  3. Treat reflux:   A. Continue Gaviscon  B.  Magnesium/calcium containing antacids multiple times a day  3. Can continue over-the-counter antihistamine, Auvi-Q 0.3, Albuterol if needed.  4. Return to clinic in 3 months or earlier if problem.

## 2021-03-09 NOTE — Progress Notes (Signed)
[facility]   Follow-up Note  Referring Provider: Thomes Dinning, MD Primary Provider: Thomes Dinning, MD Date of Office Visit: 03/09/2021  Subjective:   Samantha [name] (DOB: [birth year]) is a 50 y.o. female who returns to the [facility] on 03/09/2021 in re-evaluation of the following:  HPI: Samantha [name] returns to this clinic in evaluation of asthma and allergic rhinitis and a history of LPR and a history of chronic urticaria and a history of hymenoptera venom hypersensitivity state.  Her last visit to this clinic was 08 September 2020.  She still continues to have rather significant problems with her airway.  She has unrelenting coughing episodes that appear to occur 3-5 times per week that can last up to several hours.  They appear to be precipitated when having cold air exposure but certainly can occur without any cold air exposure.  She coughs to the extent where she will end up vomiting and she feels as though she is getting some choking and her throat gets all messed up with these episodes.  She uses albuterol which has not really helped that much.  She uses Primatene Mist which helps.  It she be noted that her only controller agent for asthma is Qvar as she has been intolerant of multiple combination drugs in the past.  She also has rather significant reflux that is not under good control.  She has been intolerant of using proton pump inhibitors and H2 receptor blocker and apparently it was suggested that she undergo a Nissen fundoplication which she is not considering at this point in time.  She has been using Gaviscon but she still has problems with regurgitation and burning.  Her skin has been doing relatively well and she has not really had any allergic reactions recently but if she goes out in the sunlight she does get really itchy and red.  She has microscopic hematuria and she underwent evaluation for  connective tissue disease which apparently was negative.  She met with a nephrologist and there was a decision made not to pursue a kidney biopsy.  Allergies as of 03/09/2021       Reactions   Bactrim Anaphylaxis   Bee Venom Hives   Chocolate Flavor Anaphylaxis   Cymbalta [duloxetine Hcl] Shortness Of Breath   Other Hives   Insect stings Wine spirit   Aspartame Other (See Comments)   Coconut Oil Other (See Comments)   Cranberry Extract Other (See Comments)   Elemental Sulfur Itching   Adhesive [tape]    Ciprofloxacin    Doxycycline    Possibly joint pain or itch   Erythromycin    Upset stomach   Hydrocodone Bit-homatrop Mbr    Not true allergy. "irritated"   Levofloxacin    Weakness, myalgia   Lorazepam Other (See Comments)   Hallucination   Meloxicam Itching   Naproxen Sodium Itching   Omeprazole-sodium Bicarbonate    Chest pain-sore all over-HA   Ranitidine    Severe joint pain   Robaxin [methocarbamol]    Made her very tired and unable to sleep at night   Tetracyclines & Related    Tizanidine    "knocked her out"- had a rebound migraine   Triamcinolone    Acetaminophen Rash   Etodolac Rash   Neosporin [neomycin-bacitracin Zn-polymyx] Rash        Medication List     albuterol 108 (90 Base) MCG/ACT inhaler Commonly known as: VENTOLIN HFA INHALE 2  PUFFS EVERY 4 HOURS AS NEEDED FOR WHEEZING   Alvesco 80 MCG/ACT inhaler Generic drug: ciclesonide Inhale 1 puff into the lungs 1-2 times daily depending on asthma activity. Rinse, gargle, and spit after use.   atenolol 25 MG tablet Commonly known as: TENORMIN Take 25 mg by mouth daily as needed.   atenolol-chlorthalidone 50-25 MG tablet Commonly known as: TENORETIC Take 1 tablet by mouth daily.   azelastine 0.1 % nasal spray Commonly known as: ASTELIN Place 1 spray into both nostrils every evening.   budesonide 32 MCG/ACT nasal spray Commonly known as: Rhinocort Aqua Place 1 spray into both nostrils  daily as needed for rhinitis.   cholecalciferol 1000 units tablet Commonly known as: VITAMIN D Take by mouth Nightly.   EPINEPHrine 0.3 mg/0.3 mL Soaj injection Commonly known as: EPI-PEN INJECT INTO MUSCLE ONCE FOR ALLERGIC REACTION   EPINEPHrine 0.3 mg/0.3 mL Soaj injection Commonly known as: Auvi-Q Inject 0.3 mg into the muscle as needed for anaphylaxis.   Levonorgestrel 13.5 MG Iud by Intrauterine route.   MAGNESIUM PO Take by mouth as needed.   methylphenidate 10 MG tablet Commonly known as: Ritalin Take 1 tablet (10 mg total) by mouth 3 (three) times daily with meals.   methylphenidate 10 MG tablet Commonly known as: Ritalin Take 1 tablet (10 mg total) by mouth 3 (three) times daily with meals.   methylphenidate 10 MG tablet Commonly known as: Ritalin Take 1 tablet (10 mg total) by mouth 3 (three) times daily with meals.   polyethylene glycol 17 g packet Commonly known as: MiraLax Take 17 g by mouth daily.   potassium chloride 10 MEQ tablet Commonly known as: KLOR-CON   Qvar RediHaler 80 MCG/ACT inhaler Generic drug: beclomethasone INHALE 2 PUFFS INTO LUNGS TWICE A DAY   RABEprazole 20 MG tablet Commonly known as: ACIPHEX Take 1 tablet (20 mg total) by mouth daily.   rosuvastatin 10 MG tablet Commonly known as: CRESTOR Take 10 mg by mouth.   SUMAtriptan 50 MG tablet Commonly known as: IMITREX Take by mouth.        Past Medical History:  Diagnosis Date   ADD (attention deficit disorder)    Allergy    Arthritis    Asthma    Chronic back pain    Chronic neck pain    Endometriosis    Fibromyalgia    GERD (gastroesophageal reflux disease)    Hernia    Hypertension    IBS (irritable bowel syndrome)    IC (interstitial cystitis)    Multiple allergies    OCD (obsessive compulsive disorder)    Raynaud disease    Vertigo     Past Surgical History:  Procedure Laterality Date   BLADDER SURGERY     CERVICAL FUSION     CESAREAN SECTION      COLONOSCOPY     HAND SURGERY     LESION REMOVAL  08/23/2012   Procedure: LESION REMOVAL HAND;  Surgeon: Cammie Sickle., MD;  Location: [facility];  Service: Orthopedics;  Laterality: Left;   ROTATOR CUFF REPAIR     UPPER GASTROINTESTINAL ENDOSCOPY      Review of systems negative except as noted in HPI / PMHx or noted below:  Review of Systems  Constitutional: Negative.   HENT: Negative.    Eyes: Negative.   Respiratory: Negative.    Cardiovascular: Negative.   Gastrointestinal: Negative.   Genitourinary: Negative.   Musculoskeletal: Negative.   Skin: Negative.   Neurological: Negative.  Endo/Heme/Allergies: Negative.   Psychiatric/Behavioral: Negative.      Objective:   Vitals:   03/09/21 1211  BP: 130/78  Pulse: 65  Resp: 16  Temp: 98.3 F (36.8 C)  SpO2: 96%   Height: $Remove'5\' 3"'[id number]$  (160 cm)  Weight: 172 lb 6.4 oz (78.2 kg)   Physical Exam Constitutional:      Appearance: She is not diaphoretic.  HENT:     Head: Normocephalic.     Right Ear: Tympanic membrane, ear canal and external ear normal.     Left Ear: Tympanic membrane, ear canal and external ear normal.     Nose: Nose normal. No mucosal edema or rhinorrhea.     Mouth/Throat:     Pharynx: Uvula midline. No oropharyngeal exudate.  Eyes:     Conjunctiva/sclera: Conjunctivae normal.  Neck:     Thyroid: No thyromegaly.     Trachea: Trachea normal. No tracheal tenderness or tracheal deviation.  Cardiovascular:     Rate and Rhythm: Normal rate and regular rhythm.     Heart sounds: Normal heart sounds, S1 normal and S2 normal. No murmur heard. Pulmonary:     Effort: No respiratory distress.     Breath sounds: Normal breath sounds. No stridor. No wheezing or rales.  Lymphadenopathy:     Head:     Right side of head: No tonsillar adenopathy.     Left side of head: No tonsillar adenopathy.     Cervical: No cervical adenopathy.  Skin:    Findings: No erythema or rash.     Nails: There is no  clubbing.  Neurological:     Mental Status: She is alert.    Diagnostics:    Spirometry was performed and demonstrated an FEV1 of 2.04 at 75 % of predicted.  Assessment and Plan:   1. Not well controlled severe persistent asthma   2. Other allergic rhinitis   3. LPRD (laryngopharyngeal reflux disease)   4. Allergic urticaria     1. Continue treatment of asthma:   A. Qvar 80 - 2 inhalations 1-2 times per day depending on disease activity B. Submit for tezepelumab administration  2. Continue treatment of allergic rhinitis:   A. OTC Nasacort - 1 spray each nostril 1-2 times per day  B. Azelastine nasal spray - 1 spray each nostril 1-2 times per day  3. Treat reflux:   A. Continue Gaviscon  B.  Magnesium/calcium containing antacids multiple times a day  3. Can continue over-the-counter antihistamine, Auvi-Q 0.3, Albuterol if needed.  4. Return to clinic in 3 months or earlier if problem.    Samantha [name] does not have good control of her airway issue or her reflux issue.  This lack of control is partially on the basis of all of her medication intolerances that are utilized to control these disease states.  We will now see if she qualifies for tezepelumab administration to address the inflammatory component of her airway disease and I have encouraged her to use magnesium/calcium containing antacids multiple times a day to address her reflux issue.  There is no way that she is going to consider a Nissen fundoplication to address her reflux issue at this point in time.  I will see her back in this clinic in 3 months or earlier if there is a problem.    Allena Katz, MD Allergy / Immunology [place]

## 2021-03-10 ENCOUNTER — Encounter: Payer: Self-pay | Admitting: Allergy and Immunology

## 2021-03-23 ENCOUNTER — Encounter: Payer: Self-pay | Admitting: Allergy and Immunology

## 2021-04-29 NOTE — Patient Instructions (Addendum)
  1. Continue treatment of asthma:   A. Qvar 80 - 2 inhalations 1-2 times per day depending on disease activity B. I will contact Tammy, our biologics coordinator about tezepelumab administration since you have not heard anything  2. Continue treatment of allergic rhinitis:   A. OTC Nasacort - 1 spray each nostril 1-2 times per day  B. Azelastine nasal spray - 1 spray each nostril 1-2 times per day           C. Start prednisone 10 mg taking 1 tablet twice a day for 4 days, then on the 5th day take one tablet and stop  3. Treat reflux:   A. Continue Gaviscon    3. Can continue over-the-counter antihistamine, Auvi-Q 0.3, Albuterol if needed.  4. Return to clinic in 1 month with Dr. Neldon Mc or earlier if problem.

## 2021-04-30 ENCOUNTER — Telehealth: Payer: Self-pay

## 2021-04-30 ENCOUNTER — Other Ambulatory Visit: Payer: Self-pay

## 2021-04-30 ENCOUNTER — Ambulatory Visit (INDEPENDENT_AMBULATORY_CARE_PROVIDER_SITE_OTHER): Payer: No Typology Code available for payment source | Admitting: Family

## 2021-04-30 ENCOUNTER — Encounter: Payer: Self-pay | Admitting: Family

## 2021-04-30 VITALS — BP 118/86 | HR 62 | Temp 97.8°F | Resp 16 | Ht 63.0 in | Wt 170.8 lb

## 2021-04-30 DIAGNOSIS — L500 Allergic urticaria: Secondary | ICD-10-CM | POA: Diagnosis not present

## 2021-04-30 DIAGNOSIS — K219 Gastro-esophageal reflux disease without esophagitis: Secondary | ICD-10-CM | POA: Diagnosis not present

## 2021-04-30 DIAGNOSIS — J4540 Moderate persistent asthma, uncomplicated: Secondary | ICD-10-CM

## 2021-04-30 DIAGNOSIS — J3089 Other allergic rhinitis: Secondary | ICD-10-CM

## 2021-04-30 DIAGNOSIS — J4550 Severe persistent asthma, uncomplicated: Secondary | ICD-10-CM | POA: Diagnosis not present

## 2021-04-30 MED ORDER — ALBUTEROL SULFATE HFA 108 (90 BASE) MCG/ACT IN AERS: 2.0000 | INHALATION_SPRAY | RESPIRATORY_TRACT | 1 refills | Status: DC | PRN

## 2021-04-30 MED ORDER — TRIAMCINOLONE ACETONIDE 55 MCG/ACT NA AERO: INHALATION_SPRAY | NASAL | 5 refills | Status: DC

## 2021-04-30 MED ORDER — QVAR REDIHALER 80 MCG/ACT IN AERB: INHALATION_SPRAY | RESPIRATORY_TRACT | 3 refills | Status: DC

## 2021-04-30 NOTE — Telephone Encounter (Signed)
Patient stated at visit with christine that she has not heard back from [employer] about starting [employer]. She does not recall signing the forms so I got her to sign them today. I have not faxed them or added Dr. Bruna Potter information to the paperwork yet. I wanted to get verification before moving forward please advise. Paper work for [employer] has been placed in pending.

## 2021-04-30 NOTE — Progress Notes (Signed)
[address] [place] [address] Dept: [phone]  FOLLOW UP NOTE  Patient ID: Samantha [name], female    DOB: [birth year]  Age: 50 y.o. MRN: [id number] Date of Office Visit: 04/30/2021  Assessment  Chief Complaint: Asthma (Has been sick for 3 weeks. Did 2 home test for covid and it was negative. Was given a zpack and prednisone and still has issues with her breathing) and Sinusitis  HPI Samantha [name] is a 50 year old female who presents today for an acute visit.  She was last seen on March 09, 2021 by Dr. Neldon Mc for not well controlled severe persistent asthma, allergic rhinitis, laryngopharyngeal reflux disease, and allergic urticaria.  Not well controlled severe persistent asthma is reported as not well controlled with Qvar 80 mcg 2 puffs 1-2 times a day depending on her symptoms and albuterol as needed.  She has been intolerant to multiple combination inhalers in the past.  She reports a dry cough, occasional wheezing, occasional tightness in her chest, and shortness of breath.  She denies any nocturnal awakenings due to breathing problems.  She reports it feels like someone hit her chest.  She is using her albuterol inhaler 2-3 times a week.  She has not heard anything about possibly starting Tezspire injections.  Since her last office visit she has been to  urgent care once.  Last Monday she went to urgent care due to having fevers of 99-99.9, chills, sweating, freezing, headaches, congestion, sinus pressure, postnasal drip, cough, chest pain, abdominal pain, gas, constipation, diarrhea, oxygen dropping to 92 and 93, loss of appetite, and dizziness.  She was given Z-Pak and prednisone.  She reports that she finished both of these medications on Saturday and that they" tempered her symptoms."  Allergic rhinitis is reported as not well controlled with azelastine nasal spray 1 spray each nostril 1-2 times a day and Nasacort 1 spray each nostril 1-2 times a day.  She  now reports sinus pressure, headache behind her eyes, postnasal drip, nasal congestion, sore throat, and her glands feel tender at times and at times they go away.  She also reports feeling tired and dizzy.  She denies fever and rhinorrhea.  She reports that she in the process of finding a new primary care physician because her primary care refused to see her.  She reports that she has taken 2 at home COVID-19 test since these symptoms started 3 weeks ago and they were negative and at the urgent care they tested for influenza and COVID-19 were negative.  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  She has had her husband do this job for her but there is times that she has still gone.  Laryngopharyngeal reflux disease is reported as moderately controlled with Gaviscon as needed.  She reports that she cannot take anything else for reflux. She has tried cutting down on her Coke consumption and she will now have reflux symptoms depending on how well she controls her diet.     Drug Allergies:  Allergies  Allergen Reactions   Bactrim Anaphylaxis   Bee Venom Hives   Chocolate Flavor Anaphylaxis   Cymbalta [Duloxetine Hcl] Shortness Of Breath   Other Hives    Insect stings Wine spirit   Aspartame Other (See Comments)   Coconut Oil Other (See Comments)   Cranberry Extract Other (See Comments)   Elemental Sulfur Itching   Adhesive [Tape]  Ciprofloxacin    Doxycycline     Possibly joint pain or itch    Erythromycin     Upset stomach   Hydrocodone Bit-Homatrop Mbr     Not true allergy. "irritated"   Levofloxacin     Weakness, myalgia   Lorazepam Other (See Comments)    Hallucination   Meloxicam Itching   Naproxen Sodium Itching   Omeprazole-Sodium Bicarbonate     Chest pain-sore all over-HA   Ranitidine     Severe joint pain   Robaxin [Methocarbamol]     Made her very tired and unable to sleep at night   Tetracyclines  & Related    Tizanidine     "knocked her out"- had a rebound migraine   Triamcinolone    Acetaminophen Rash   Etodolac Rash   Neosporin [Neomycin-Bacitracin Zn-Polymyx] Rash    Review of Systems: Review of Systems  Constitutional:  Positive for chills and fever.  HENT:         Reports postnasal drip, sinus pressure and nasal congestion.  Denies rhinorrhea  Eyes:        Reports itchy watery eyes at times for which Pataday eyedrops help  Respiratory:  Positive for cough, shortness of breath and wheezing.        Reports dry cough, occasional wheezing and tightness in her chest, and shortness of breath  Cardiovascular:  Negative for chest pain and palpitations.  Gastrointestinal:        Reports reflux symptoms at times.  She reports that reflux has gotten better since she has cut down on her Coke consumption  Genitourinary:  Negative for dysuria.  Skin:  Negative for itching and rash.  Neurological:  Positive for headaches.  Endo/Heme/Allergies:  Positive for environmental allergies.    Physical Exam: BP 118/86   Pulse 62   Temp 97.8 F (36.6 C)   Resp 16   Ht '5\' 3"'$  (1.6 m)   Wt 170 lb 12.8 oz (77.5 kg)   SpO2 96%   BMI 30.26 kg/m    Physical Exam Constitutional:      Appearance: Normal appearance.  HENT:     Head: Normocephalic and atraumatic.     Comments: Pharynx normal, eyes normal, ears normal, nose bilateral lower turbinates mildly edematous with no drainage noted    Right Ear: Tympanic membrane, ear canal and external ear normal.     Left Ear: Tympanic membrane, ear canal and external ear normal.     Mouth/Throat:     Mouth: Mucous membranes are moist.     Pharynx: Oropharynx is clear.  Eyes:     Conjunctiva/sclera: Conjunctivae normal.  Cardiovascular:     Rate and Rhythm: Normal rate and regular rhythm.     Heart sounds: Normal heart sounds.  Pulmonary:     Effort: Pulmonary effort is normal.     Breath sounds: Normal breath sounds.     Comments: Lungs  clear to auscultation Musculoskeletal:     Cervical back: Neck supple.  Skin:    General: Skin is warm.  Neurological:     Mental Status: She is alert and oriented to person, place, and time.  Psychiatric:        Mood and Affect: Mood normal.        Behavior: Behavior normal.        Thought Content: Thought content normal.        Judgment: Judgment normal.    Diagnostics: FVC 2.61 L, FEV1 2.05 L.  Predicted FVC 3.42  L, predicted FEV1 2.71 L.  Spirometry indicates mild restriction.  Spirometry is consistent with previous spirometry.  Assessment and Plan: 1. Not well controlled severe persistent asthma   2. Other allergic rhinitis   3. LPRD (laryngopharyngeal reflux disease)   4. Allergic urticaria     Meds ordered this encounter  Medications   beclomethasone (QVAR REDIHALER) 80 MCG/ACT inhaler    Sig: Inhale 2 inhalations 1-2 times per day    Dispense:  10.6 g    Refill:  3   albuterol (VENTOLIN HFA) 108 (90 Base) MCG/ACT inhaler    Sig: Inhale 2 puffs into the lungs every 4 (four) hours as needed for wheezing or shortness of breath.    Dispense:  18 g    Refill:  1   triamcinolone (NASACORT) 55 MCG/ACT AERO nasal inhaler    Sig: spray 1 spray each nostril 1-2 times per day    Dispense:  16.5 g    Refill:  5    Patient Instructions    1. Continue treatment of asthma:   A. Qvar 80 - 2 inhalations 1-2 times per day depending on disease activity B. I will contact Tammy, our biologics coordinator about tezepelumab administration since you have not heard anything  2. Continue treatment of allergic rhinitis:   A. OTC Nasacort - 1 spray each nostril 1-2 times per day  B. Azelastine nasal spray - 1 spray each nostril 1-2 times per day           C. Start prednisone 10 mg taking 1 tablet twice a day for 4 days, then on the 5th day take one tablet and stop  3. Treat reflux:   A. Continue Gaviscon    3. Can continue over-the-counter antihistamine, Auvi-Q 0.3, Albuterol if  needed.  4. Return to clinic in 1 month with Dr. Neldon Mc or earlier if problem.                     Return in about 4 weeks (around 05/28/2021).    Thank you for the opportunity to care for this patient.  Please do not hesitate to contact me with questions.  Althea Charon, FNP [facility]

## 2021-05-03 NOTE — Telephone Encounter (Signed)
Forms have been faxed to Tammy per Dr. Neldon Mc.

## 2021-05-04 NOTE — Telephone Encounter (Signed)
Spoke to patient and advised I would be working on her approval and be back in touch with her

## 2021-05-10 NOTE — Telephone Encounter (Signed)
Please inform her that were having difficulty getting her biologic approved.  She does not have eosinophilia so she does not qualify for an anti-IL-5 agent or an anti-IL-4/IL-13 agent.  Please have her check blood for IgE and area two aero allergen profile to see if she qualifies for omalizumab.

## 2021-05-11 NOTE — Addendum Note (Signed)
Addended by: Katherina Right D on: 05/11/2021 12:03 PM   Modules accepted: Orders

## 2021-05-11 NOTE — Telephone Encounter (Addendum)
Left a voicemail for a return call. Orders placed to [facility].

## 2021-05-13 NOTE — Telephone Encounter (Signed)
Spoke to patient and advised that her Ins has denied Tezspire and will have to determine if she has allergic asthma since she does not qualify for any of the biologics for eosinophilic asthma.  Advised to go to [facility] or [facility] office to have done and I will reach back out after to discuss possibe Xolair for asthma that will need to be tried and failed before Ins will approve Tezspire

## 2021-05-21 LAB — ALLERGENS W/COMP RFLX AREA 2
Alternaria Alternata IgE: <0.1 kU/L
Aspergillus Fumigatus IgE: <0.1 kU/L
Bermuda Grass IgE: <0.1 kU/L
Cedar, Mountain IgE: <0.1 kU/L
Cladosporium Herbarum IgE: <0.1 kU/L
Cockroach, German IgE: <0.1 kU/L
Common Silver Birch IgE: <0.1 kU/L
Cottonwood IgE: <0.1 kU/L
D Farinae IgE: <0.1 kU/L
D Pteronyssinus IgE: <0.1 kU/L
E001-IgE Cat Dander: <0.1 kU/L
E005-IgE Dog Dander: <0.1 kU/L
Elm, American IgE: <0.1 kU/L
IgE (Immunoglobulin E), Serum: 59 IU/mL (ref 6–495)
Johnson Grass IgE: <0.1 kU/L
Maple/Box Elder IgE: <0.1 kU/L
Mouse Urine IgE: <0.1 kU/L
Oak, White IgE: <0.1 kU/L
Pecan, Hickory IgE: <0.1 kU/L
Penicillium Chrysogen IgE: <0.1 kU/L
Pigweed, Rough IgE: <0.1 kU/L
Ragweed, Short IgE: <0.1 kU/L
Sheep Sorrel IgE Qn: <0.1 kU/L
Timothy Grass IgE: <0.1 kU/L
White Mulberry IgE: <0.1 kU/L

## 2021-05-31 ENCOUNTER — Ambulatory Visit: Payer: No Typology Code available for payment source | Admitting: Family

## 2021-06-15 ENCOUNTER — Ambulatory Visit (INDEPENDENT_AMBULATORY_CARE_PROVIDER_SITE_OTHER): Payer: No Typology Code available for payment source | Admitting: Allergy and Immunology

## 2021-06-15 ENCOUNTER — Encounter: Payer: Self-pay | Admitting: Allergy and Immunology

## 2021-06-15 ENCOUNTER — Other Ambulatory Visit: Payer: Self-pay

## 2021-06-15 VITALS — BP 114/78 | HR 78 | Temp 98.0°F | Resp 18

## 2021-06-15 DIAGNOSIS — J4550 Severe persistent asthma, uncomplicated: Secondary | ICD-10-CM | POA: Diagnosis not present

## 2021-06-15 DIAGNOSIS — K219 Gastro-esophageal reflux disease without esophagitis: Secondary | ICD-10-CM

## 2021-06-15 DIAGNOSIS — J3089 Other allergic rhinitis: Secondary | ICD-10-CM | POA: Diagnosis not present

## 2021-06-15 DIAGNOSIS — L500 Allergic urticaria: Secondary | ICD-10-CM

## 2021-06-15 MED ORDER — AIRDUO DIGIHALER 232-14 MCG/ACT IN AEPB: 1.0000 | INHALATION_SPRAY | Freq: Two times a day (BID) | RESPIRATORY_TRACT | 5 refills | Status: DC

## 2021-06-15 MED ORDER — AZELASTINE HCL 0.1 % NA SOLN: 1.0000 | Freq: Two times a day (BID) | NASAL | 5 refills | Status: DC | PRN

## 2021-06-15 NOTE — Patient Instructions (Addendum)
  1. Continue treatment of asthma:   A. Airduo 232 - 1 inhalations 2 times per day (speciality pharmacy) B. Submit for tezepelumab administration  2. Continue treatment of allergic rhinitis:   A. OTC Nasacort - 1 spray each nostril 1-2 times per day  B. Azelastine nasal spray - 1 spray each nostril 1-2 times per day  3. Treat reflux:   A. Continue Gaviscon  3. Can continue over-the-counter antihistamine, Auvi-Q 0.3, Albuterol if needed.  4. Obtain fall flu vaccine  5. Return to clinic in 12 weeks or earlier if problem

## 2021-06-15 NOTE — Progress Notes (Signed)
[facility] [place]   Follow-up Note  Referring Provider: Thomes Dinning, MD Primary Provider: Thomes Dinning, MD (Inactive) Date of Office Visit: 06/15/2021  Subjective:   Samantha [name] (DOB: 03/23/1971) is a 50 y.o. female who returns to the [facility] on 06/15/2021 in re-evaluation of the following:  HPI: Samantha [name] returns to this clinic in reevaluation of not well controlled severe persistent asthma, allergic rhinitis, LPR, and history of urticaria.  Her last visit to this clinic was with our nurse practitioner on 30 April 2021.  I last saw her in this clinic on 07 March 2021.  Since her last visit with me in this clinic she has required two systemic steroids to treat asthma exacerbations.  She uses her short acting bronchodilator about 6 times per day.  She has wheezing and coughing and cannot really exert herself at all.  She has been using Qvar as her controller agent as she has had significant side effects from utilizing a collection of other agents in the past.  We have attempted to get her approval for anti-TSLP antibody but her insurance company has blocked approval of this agent.  At this point her nose is doing relatively well.  Her reflux is doing okay.  She has tapered off her caffeine use.  She only uses Gaviscon for her reflux because of all the side effects from other agents utilized in the past.  She has not been having any issues with urticaria or flushing.  She has received 4 COVID vaccines.  Allergies as of 06/15/2021       Reactions   Bactrim Anaphylaxis   Bee Venom Hives   Chocolate Flavor Anaphylaxis   Clindamycin Shortness Of Breath   Coconut Oil Other (See Comments), Hives, Itching   Cymbalta [duloxetine Hcl] Shortness Of Breath   Duloxetine Anaphylaxis   Other Hives, Other (See Comments), Rash, Itching   Insect stings Wine spirit   Peanut-containing Drug Products Swelling, Other  (See Comments), Itching, Shortness Of Breath, Hives   Ramipril Anaphylaxis   Sulfamethoxazole-trimethoprim Anaphylaxis   Aspartame Other (See Comments)   Cranberry Extract Other (See Comments)   Elemental Sulfur Itching   Sulfur Itching, Other (See Comments)   Adhesive [tape]    Amoxicillin    Ciprofloxacin    Demeclocycline    Dexlansoprazole    Doxycycline    Possibly joint pain or itch   Erythromycin    Upset stomach   Erythromycin Base    Esomeprazole    Hydrocodone Itching   Hydrocodone Bit-homatrop Mbr    Not true allergy. "irritated"   Hydrocodone Bit-homatrop Mbr    Hydromorphone Nausea Only   Levofloxacin    Weakness, myalgia   Levofloxacin    Lorazepam Other (See Comments)   Hallucination   Meloxicam Itching   Naproxen Itching   Naproxen Sodium Itching   Omeprazole-sodium Bicarbonate    Chest pain-sore all over-HA   Ranitidine    Severe joint pain   Robaxin [methocarbamol]    Made her very tired and unable to sleep at night   Tetracyclines & Related    Tizanidine    "knocked her out"- had a rebound migraine   Trazodone    Triamcinolone    Acetaminophen Rash   Etodolac Rash   Neosporin [neomycin-bacitracin Zn-polymyx] Rash        Medication List    albuterol 108 (90 Base) MCG/ACT inhaler Commonly known as: VENTOLIN HFA Inhale 2 puffs into  the lungs every 4 (four) hours as needed for wheezing or shortness of breath.   ALPRAZolam 1 MG tablet Commonly known as: XANAX Take 1 mg by mouth 2 (two) times daily as needed.   atenolol 25 MG tablet Commonly known as: TENORMIN Take 25 mg by mouth daily as needed.   atenolol-chlorthalidone 50-25 MG tablet Commonly known as: TENORETIC Take 1 tablet by mouth daily.   azelastine 0.1 % nasal spray Commonly known as: ASTELIN Place 1 spray into both nostrils every evening.   budesonide 0.5 MG/2ML nebulizer solution Commonly known as: PULMICORT budesonide 0.5 mg/2 mL suspension for nebulization    cholecalciferol 1000 units tablet Commonly known as: VITAMIN D Take by mouth Nightly.   Cholecalciferol 25 MCG (1000 UT) capsule Take by mouth.   diazepam 10 MG tablet Commonly known as: VALIUM Take by mouth.   EPINEPHrine 0.3 mg/0.3 mL Soaj injection Commonly known as: EPI-PEN INJECT INTO MUSCLE ONCE FOR ALLERGIC REACTION   EPINEPHrine 0.3 mg/0.3 mL Soaj injection Commonly known as: Auvi-Q Inject 0.3 mg into the muscle as needed for anaphylaxis.   hydrochlorothiazide 25 MG tablet Commonly known as: HYDRODIURIL hydrochlorothiazide 25 mg tablet   Levonorgestrel 13.5 MG Iud by Intrauterine route.   MAGNESIUM PO Take by mouth as needed.   methylphenidate 10 MG tablet Commonly known as: Ritalin Take 1 tablet (10 mg total) by mouth 3 (three) times daily with meals.   methylphenidate 10 MG tablet Commonly known as: Ritalin Take 1 tablet (10 mg total) by mouth 3 (three) times daily with meals.   methylphenidate 10 MG tablet Commonly known as: Ritalin Take 1 tablet (10 mg total) by mouth 3 (three) times daily with meals.   methylphenidate 10 MG tablet Commonly known as: RITALIN methylphenidate 10 mg tablet   polyethylene glycol 17 g packet Commonly known as: MiraLax Take 17 g by mouth daily.   potassium chloride 10 MEQ tablet Commonly known as: KLOR-CON   Qvar RediHaler 80 MCG/ACT inhaler Generic drug: beclomethasone Inhale 2 inhalations 1-2 times per day   RABEprazole 20 MG tablet Commonly known as: ACIPHEX Take 1 tablet (20 mg total) by mouth daily.   rosuvastatin 10 MG tablet Commonly known as: CRESTOR Take 10 mg by mouth.   SUMAtriptan 50 MG tablet Commonly known as: IMITREX Take by mouth.   triamcinolone 55 MCG/ACT Aero nasal inhaler Commonly known as: NASACORT spray 1 spray each nostril 1-2 times per day   [place] 81 MG Caps Generic drug: Aspirin Take by mouth.    Past Medical History:  Diagnosis Date   ADD (attention deficit disorder)     Allergy    Arthritis    Asthma    Chronic back pain    Chronic neck pain    Endometriosis    Fibromyalgia    GERD (gastroesophageal reflux disease)    Hernia    Hypertension    IBS (irritable bowel syndrome)    IC (interstitial cystitis)    Multiple allergies    OCD (obsessive compulsive disorder)    Raynaud disease    Vertigo     Past Surgical History:  Procedure Laterality Date   BLADDER SURGERY     CERVICAL FUSION     CESAREAN SECTION     COLONOSCOPY     HAND SURGERY     LESION REMOVAL  08/23/2012   Procedure: LESION REMOVAL HAND;  Surgeon: Cammie Sickle., MD;  Location: [facility];  Service: Orthopedics;  Laterality: Left;  ROTATOR CUFF REPAIR     UPPER GASTROINTESTINAL ENDOSCOPY      Review of systems negative except as noted in HPI / PMHx or noted below:  Review of Systems  Constitutional: Negative.   HENT: Negative.    Eyes: Negative.   Respiratory: Negative.    Cardiovascular: Negative.   Gastrointestinal: Negative.   Genitourinary: Negative.   Musculoskeletal: Negative.   Skin: Negative.   Neurological: Negative.   Endo/Heme/Allergies: Negative.   Psychiatric/Behavioral: Negative.      Objective:   Vitals:   06/15/21 1134  BP: 114/78  Pulse: 78  Resp: 18  Temp: 98 F (36.7 C)  SpO2: 95%          Physical Exam Constitutional:      Appearance: She is not diaphoretic.  HENT:     Head: Normocephalic.     Right Ear: Tympanic membrane, ear canal and external ear normal.     Left Ear: Tympanic membrane, ear canal and external ear normal.     Nose: Nose normal. No mucosal edema or rhinorrhea.     Mouth/Throat:     Pharynx: Uvula midline. No oropharyngeal exudate.  Eyes:     Conjunctiva/sclera: Conjunctivae normal.  Neck:     Thyroid: No thyromegaly.     Trachea: Trachea normal. No tracheal tenderness or tracheal deviation.  Cardiovascular:     Rate and Rhythm: Normal rate and regular rhythm.     Heart sounds:  Normal heart sounds, S1 normal and S2 normal. No murmur heard. Pulmonary:     Effort: No respiratory distress.     Breath sounds: Normal breath sounds. No stridor. No wheezing or rales.  Lymphadenopathy:     Head:     Right side of head: No tonsillar adenopathy.     Left side of head: No tonsillar adenopathy.     Cervical: No cervical adenopathy.  Skin:    Findings: No erythema or rash.     Nails: There is no clubbing.  Neurological:     Mental Status: She is alert.    Diagnostics:   The patient had an Asthma Control Test with the following results: ACT Total Score: 12.    Results of blood tests obtained 14 May 2021 identified serum IgE 59 KU/L, no antigen specific IgE antibodies on an area to aero allergen profile.  Results of blood tests obtained 08 April 2021 identified WBC 6.1, absolute eosinophil 100, absolute lymphocyte 2700, hemoglobin 13.2, platelet 203.  Assessment and Plan:   1. Not well controlled severe persistent asthma   2. Other allergic rhinitis   3. LPRD (laryngopharyngeal reflux disease)   4. Allergic urticaria      1. Continue treatment of asthma:   A. Airduo 232 - 1 inhalations 2 times per day (speciality pharmacy) B. Submit for tezepelumab administration  2. Continue treatment of allergic rhinitis:   A. OTC Nasacort - 1 spray each nostril 1-2 times per day  B. Azelastine nasal spray - 1 spray each nostril 1-2 times per day  3. Treat reflux:   A. Continue Gaviscon  3. Can continue over-the-counter antihistamine, Auvi-Q 0.3, Albuterol if needed.  4. Obtain fall flu vaccine  5. Return to clinic in 12 weeks or earlier if problem  Mee will utilize a combination inhaler as noted above.  In the past she has had lots of problems with combination controller inhalers and about we will try a combination controller inhaler again to satisfy the insurance company that she does require anti-TSL  P antibody which would be one of the better treatments  for her to consider especially given the fact that she remains with severe asthma and has required multiple courses of systemic steroids per year and has a bronchodilator requirements of 6 times per week and is not eosinophilic and is not atopic.  She will continue on other anti-inflammatory agents for her airway and treat her reflux as best as possible with the therapy noted above.  I will see her back in this clinic in 12 weeks or earlier if there is a problem.    Allena Katz, MD Allergy / Immunology [place]

## 2021-06-16 ENCOUNTER — Encounter: Payer: Self-pay | Admitting: Allergy and Immunology

## 2021-06-23 ENCOUNTER — Encounter: Payer: Self-pay | Admitting: Allergy and Immunology

## 2021-06-28 ENCOUNTER — Telehealth: Payer: Self-pay | Admitting: *Deleted

## 2021-06-28 NOTE — Telephone Encounter (Signed)
-----   Message from Jiles Prows, MD sent at 06/16/2021  7:05 AM EDT ----- Samantha [name]

## 2021-06-28 NOTE — Telephone Encounter (Signed)
Discussed with patient last week I am working on getting approval for [employer] but Rx benefits that does her approval has denied appeal now so we will need to get 2nd level appeal

## 2021-06-29 ENCOUNTER — Encounter: Payer: Self-pay | Admitting: Allergy and Immunology

## 2021-07-02 ENCOUNTER — Other Ambulatory Visit: Payer: Self-pay | Admitting: Allergy and Immunology

## 2021-07-05 NOTE — Telephone Encounter (Signed)
Spoke to patient and advised approval and submit to [employer] for [employer].  I have signed patient up for copay card that will be emailed to her to provide for $0 out of pocket for rx and will reach out once delivery set up to make appt to start therapy

## 2021-07-09 ENCOUNTER — Other Ambulatory Visit: Payer: Self-pay

## 2021-07-12 ENCOUNTER — Ambulatory Visit: Payer: No Typology Code available for payment source | Admitting: Family Medicine

## 2021-07-15 ENCOUNTER — Telehealth: Payer: Self-pay | Admitting: *Deleted

## 2021-07-15 NOTE — Telephone Encounter (Signed)
Called patient and advised that info initially told to her by tezspire was incorrect and there is no limit monthly to what the copay card will pay. She has appt to start therapy now

## 2021-07-15 NOTE — Telephone Encounter (Signed)
-----   Message from Jiles Prows, MD sent at 07/13/2021  3:06 PM EDT ----- This is from [place]. Please help her, and me, in pushing this trough.   I need to have you make contact with Hyun Marsalis. She organizes delivery of biologic agents.  No one else can really give you a straight answer about what is going on especially in the financial aspect of biologic administration with [facility].  It is a very confusing endeavor to get biologic agents approved and administered and what you are going through is not unusual as there are multiple entities involved including the insurance company and the specialty pharmacy and the co-pay assistance company.  You can go back on Qvar until we get this administered.

## 2021-07-16 ENCOUNTER — Ambulatory Visit (INDEPENDENT_AMBULATORY_CARE_PROVIDER_SITE_OTHER): Payer: No Typology Code available for payment source

## 2021-07-16 ENCOUNTER — Other Ambulatory Visit: Payer: Self-pay

## 2021-07-16 DIAGNOSIS — J4550 Severe persistent asthma, uncomplicated: Secondary | ICD-10-CM | POA: Diagnosis not present

## 2021-07-16 MED ORDER — TEZEPELUMAB-EKKO 210 MG/1.91ML ~~LOC~~ SOSY
210.0000 mg | PREFILLED_SYRINGE | SUBCUTANEOUS | Status: DC
  Administered 2021-07-16 – 2021-08-13 (×2): 210 mg via SUBCUTANEOUS

## 2021-07-16 NOTE — Telephone Encounter (Signed)
Spoke to patient yesterday and discussed approval and copay card issue

## 2021-07-16 NOTE — Progress Notes (Signed)
Immunotherapy   Patient Details  Name: Samantha [name] MRN: [id number] Date of Birth: 19-Feb-1971  07/16/2021  Samantha [name] started Tezspire injections today. She will be coming in every 4 weeks. Patient waited in office for 30 minutes without any issues.  Consent signed and patient instructions given.   Guy Franco 07/16/2021, 9:47 AM

## 2021-07-23 ENCOUNTER — Ambulatory Visit (INDEPENDENT_AMBULATORY_CARE_PROVIDER_SITE_OTHER): Payer: No Typology Code available for payment source | Admitting: Family Medicine

## 2021-07-23 ENCOUNTER — Other Ambulatory Visit: Payer: Self-pay

## 2021-07-23 ENCOUNTER — Encounter: Payer: Self-pay | Admitting: Family Medicine

## 2021-07-23 VITALS — BP 124/72 | HR 78 | Temp 97.0°F | Ht 63.0 in | Wt 176.0 lb

## 2021-07-23 DIAGNOSIS — F908 Attention-deficit hyperactivity disorder, other type: Secondary | ICD-10-CM

## 2021-07-23 DIAGNOSIS — J310 Chronic rhinitis: Secondary | ICD-10-CM | POA: Diagnosis not present

## 2021-07-23 DIAGNOSIS — J4540 Moderate persistent asthma, uncomplicated: Secondary | ICD-10-CM

## 2021-07-23 DIAGNOSIS — E782 Mixed hyperlipidemia: Secondary | ICD-10-CM | POA: Diagnosis not present

## 2021-07-23 DIAGNOSIS — F4001 Agoraphobia with panic disorder: Secondary | ICD-10-CM

## 2021-07-23 DIAGNOSIS — G43909 Migraine, unspecified, not intractable, without status migrainosus: Secondary | ICD-10-CM

## 2021-07-23 DIAGNOSIS — R7303 Prediabetes: Secondary | ICD-10-CM | POA: Diagnosis not present

## 2021-07-23 DIAGNOSIS — E639 Nutritional deficiency, unspecified: Secondary | ICD-10-CM

## 2021-07-23 DIAGNOSIS — I10 Essential (primary) hypertension: Secondary | ICD-10-CM | POA: Diagnosis not present

## 2021-07-23 DIAGNOSIS — F5101 Primary insomnia: Secondary | ICD-10-CM

## 2021-07-23 LAB — LIPID PANEL
Cholesterol: 180 mg/dL (ref 0–200)
HDL: 62.2 mg/dL (ref >39.00)
LDL Cholesterol: 105 mg/dL — ABNORMAL HIGH (ref 0–99)
NonHDL: 118.26
Total CHOL/HDL Ratio: 3
Triglycerides: 65 mg/dL (ref 0.0–149.0)
VLDL: 13 mg/dL (ref 0.0–40.0)

## 2021-07-23 LAB — COMPREHENSIVE METABOLIC PANEL
ALT: 13 U/L (ref 0–35)
AST: 14 U/L (ref 0–37)
Albumin: 4.3 g/dL (ref 3.5–5.2)
Alkaline Phosphatase: 72 U/L (ref 39–117)
BUN: 20 mg/dL (ref 6–23)
CO2: 32 mEq/L (ref 19–32)
Calcium: 9.5 mg/dL (ref 8.4–10.5)
Chloride: 104 mEq/L (ref 96–112)
Creatinine, Ser: 1.09 mg/dL (ref 0.40–1.20)
GFR: 59.31 mL/min — ABNORMAL LOW (ref >60.00)
Glucose, Bld: 103 mg/dL — ABNORMAL HIGH (ref 70–99)
Potassium: 4 mEq/L (ref 3.5–5.1)
Sodium: 142 mEq/L (ref 135–145)
Total Bilirubin: 0.4 mg/dL (ref 0.2–1.2)
Total Protein: 6.4 g/dL (ref 6.0–8.3)

## 2021-07-23 LAB — CBC
HCT: 39.8 % (ref 36.0–46.0)
Hemoglobin: 13.4 g/dL (ref 12.0–15.0)
MCHC: 33.6 g/dL (ref 30.0–36.0)
MCV: 90.2 fl (ref 78.0–100.0)
Platelets: 202 10*3/uL (ref 150.0–400.0)
RBC: 4.41 Mil/uL (ref 3.87–5.11)
RDW: 13.3 % (ref 11.5–15.5)
WBC: 5.2 10*3/uL (ref 4.0–10.5)

## 2021-07-23 LAB — HEMOGLOBIN A1C: Hgb A1c MFr Bld: 6 % (ref 4.6–6.5)

## 2021-07-23 LAB — VITAMIN B12: Vitamin B-12: 238 pg/mL (ref 211–911)

## 2021-07-23 LAB — VITAMIN D 25 HYDROXY (VIT D DEFICIENCY, FRACTURES): VITD: 47.06 ng/mL (ref 30.00–100.00)

## 2021-07-23 LAB — MAGNESIUM: Magnesium: 2 mg/dL (ref 1.5–2.5)

## 2021-07-23 MED ORDER — ALPRAZOLAM 1 MG PO TABS: 1.0000 mg | ORAL_TABLET | Freq: Two times a day (BID) | ORAL | 1 refills | Status: DC | PRN

## 2021-07-23 MED ORDER — METHYLPHENIDATE HCL 10 MG PO TABS: 10.0000 mg | ORAL_TABLET | Freq: Three times a day (TID) | ORAL | 0 refills | Status: DC

## 2021-07-23 MED ORDER — DIAZEPAM 10 MG PO TABS: 10.0000 mg | ORAL_TABLET | Freq: Every evening | ORAL | 1 refills | Status: DC | PRN

## 2021-07-23 MED ORDER — ATENOLOL 25 MG PO TABS: 25.0000 mg | ORAL_TABLET | Freq: Every day | ORAL | 3 refills | Status: DC | PRN

## 2021-07-23 NOTE — Progress Notes (Signed)
[facility] [address] [place] [address] Dept: [phone] Dept Fax: [phone]  New Patient Office Visit  Subjective:    Patient ID: Samantha [name], female    DOB: 1971-05-13, 50 y.o..   MRN: [id number]  Chief Complaint  Patient presents with   Establish Care    NP- establish care.   Med refills.       History of Present Illness:  Patient is in today to establish care. Ms. Samantha [name] was born in [place], [place]. 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 She had moved back to [place] in 2008. She is married for the 2nd time. She denies use of tobacco, alcohol, or drugs.  Ms. Samantha [name] has a history of hypertension and is managed on atenolol.She was previously on a diuretic and had been prescribed a potassium supplement. she ahs found certain preparations of supplements caused an allergic reaction. She is no longer taking the diuretics.  She has x-ray evidence of atherosclerotic disease and has hyperlipidemia. She is on rosuvastatin for this, but notes the doctors had considered stopping this medication.  Ms. Samantha [name] has a history of migraine headaches. She uses sumatriptan for this. She notes her doctor ahd limited her to 10 doses a month. She feels she would use this more frequently than that if she could.  Ms. Samantha [name] has a history of poorly controlled severe persistent asthma. She is followed by Dr. Neldon Mc (allergy). He is managing her on Qvar and AirDuo, with albuterol for quick relief. She is also on Tezspire (tezepelumab) every 4 weeks. For her nasal allergy component, she uses azelastine and Nasacort sprays. she also keep epinephrine on hand for treating more severe reactions.  Ms. Samantha [name] has an extensive list of medication  allergies and adverse drug reactions. She notes she has had trouble with physicians not believing that all of these are real issues.  Ms. Samantha [name] has multiple psychiatric issues including anxiety with depression, PTSD, panic disorder, and ADHD. She is managed on Valium for baseline control of her anxiety and management of insomnia, and Xanax for breakthrough panic issues or acute anxiety. She is on methylphenidate TID for management of her ADHD and daytime hypersomnia.  Ms. Samantha [name] notes she has had trouble with vitamin deficiencies for B12 and Vitamin D. As well, she takes a magnesium supplement.  Past Medical History: Patient Active Problem List   Diagnosis Date Noted   Prediabetes 08/27/2020   History of [facility] spotted fever 02/04/2020   Moderate persistent asthma without complication 51/70/0174   LPRD (laryngopharyngeal reflux disease) 08/02/2019   Chronic rhinitis 08/02/2019   Chronic post-traumatic stress disorder (PTSD) 11/21/2018   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) 11/21/2018   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/2018   Pulmonary nodules 11/13/2018   Other microscopic hematuria 11/06/2018   Anxiety and depression 07/04/2018   Fecal incontinence 07/02/2018   Leg weakness 06/12/2017   Ataxic gait 02/07/2017   Left ear hearing loss 02/07/2017   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   Mixed hyperlipidemia 11/08/2016   Acute idiopathic gout of multiple sites 11/08/2016   Elbow pain, chronic, right 10/06/2016   Lateral epicondylitis, right elbow 10/06/2016   Fibromyalgia 10/04/2016   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   Migraine headache 07/29/2016  Irritable bowel syndrome with both constipation and diarrhea 07/29/2016   Hives 07/18/2016   Urinary incontinence 06/02/2016   Atherosclerosis of abdominal aorta (HCC) 09/03/2015   Carpal tunnel syndrome 11/19/2014   Primary hypersomnia 11/18/2014   Insomnia  11/18/2014   Lumbar radicular pain 11/18/2014   Chronic pain syndrome 11/18/2014   Cervical disc disorder with radiculopathy 11/18/2014   Chronic female pelvic pain 07/31/2014   Degeneration of intervertebral disc of lumbosacral region 07/31/2014   Rosacea 07/31/2014   Morton's neuroma 07/31/2014   Benign paroxysmal positional vertigo 07/31/2014   Essential hypertension 07/31/2014   History of methicillin resistant Staphylococcus aureus infection 07/09/2014   Past Surgical History:  Procedure Laterality Date   BLADDER SURGERY     CERVICAL FUSION     CESAREAN SECTION     COLONOSCOPY     HAND SURGERY     LESION REMOVAL  08/23/2012   Procedure: LESION REMOVAL HAND;  Surgeon: Cammie Sickle., MD;  Location: [facility];  Service: Orthopedics;  Laterality: Left;   ROTATOR CUFF REPAIR     UPPER GASTROINTESTINAL ENDOSCOPY     Family History  Problem Relation Age of Onset   ADD / ADHD Brother    Stomach cancer Maternal Grandfather    Colon cancer Neg Hx    Esophageal cancer Neg Hx    Rectal cancer Neg Hx    Outpatient Medications Prior to Visit  Medication Sig Dispense Refill   albuterol (VENTOLIN HFA) 108 (90 Base) MCG/ACT inhaler Inhale 2 puffs into the lungs every 4 (four) hours as needed for wheezing or shortness of breath. 18 g 1   Aspirin (VAZALORE) 81 MG CAPS Take by mouth.     azelastine (ASTELIN) 0.1 % nasal spray Place 1 spray into both nostrils 2 (two) times daily as needed for rhinitis. 30 mL 5   beclomethasone (QVAR REDIHALER) 80 MCG/ACT inhaler Inhale 2 inhalations 1-2 times per day 10.6 g 3   Benzonatate 150 MG CAPS Take by mouth.     cholecalciferol (VITAMIN D) 1000 UNITS tablet Take by mouth Nightly.     dextromethorphan-guaiFENesin (MUCINEX DM) 30-600 MG 12hr tablet Take 1 tablet by mouth 2 (two) times daily.     diphenhydrAMINE (BENADRYL) 25 MG tablet Take 25 mg by mouth every 6 (six) hours as needed.     EPINEPHrine (AUVI-Q) 0.3 mg/0.3 mL IJ  SOAJ injection Inject 0.3 mg into the muscle as needed for anaphylaxis. 1 each 1   EPINEPHrine (PRIMATENE MIST) 0.125 MG/ACT AERO Inhale into the lungs.     estradiol (ESTRACE) 0.1 MG/GM vaginal cream Place vaginally.     Levonorgestrel 13.5 MG IUD by Intrauterine route.     MAGNESIUM PO Take by mouth as needed.     polyethylene glycol (MIRALAX) 17 g packet Take 17 g by mouth daily. 14 each 0   rosuvastatin (CRESTOR) 10 MG tablet Take 10 mg by mouth.     SUMAtriptan (IMITREX) 50 MG tablet Take by mouth.     triamcinolone (NASACORT) 55 MCG/ACT AERO nasal inhaler spray 1 spray each nostril 1-2 times per day 16.5 g 5   ALPRAZolam (XANAX) 1 MG tablet Take 1 mg by mouth 2 (two) times daily as needed.     atenolol (TENORMIN) 25 MG tablet Take 25 mg by mouth daily as needed.     atenolol-chlorthalidone (TENORETIC) 50-25 MG tablet Take 1 tablet by mouth daily.     budesonide (PULMICORT) 0.5 MG/2ML nebulizer solution budesonide 0.5 mg/2  mL suspension for nebulization     Cholecalciferol 25 MCG (1000 UT) capsule Take by mouth.     diazepam (VALIUM) 10 MG tablet Take by mouth.     methylphenidate (RITALIN) 10 MG tablet Take 1 tablet (10 mg total) by mouth 3 (three) times daily with meals. 90 tablet 0   EPIPEN 2-PAK 0.3 MG/0.3ML SOAJ injection INJECT INTO THE MUSCLE ONCE FOR ALLERGICREACTION 2 each 1   Fluticasone-Salmeterol,sensor, (AIRDUO DIGIHALER) 232-14 MCG/ACT AEPB Inhale 1 puff into the lungs in the morning and at bedtime. 1 each 5   hydrochlorothiazide (HYDRODIURIL) 25 MG tablet hydrochlorothiazide 25 mg tablet     methylphenidate (RITALIN) 10 MG tablet Take 1 tablet (10 mg total) by mouth 3 (three) times daily with meals. 90 tablet 0   methylphenidate (RITALIN) 10 MG tablet Take 1 tablet (10 mg total) by mouth 3 (three) times daily with meals. 90 tablet 0   methylphenidate (RITALIN) 10 MG tablet methylphenidate 10 mg tablet     potassium chloride (K-DUR) 10 MEQ tablet      RABEprazole (ACIPHEX)  20 MG tablet Take 1 tablet (20 mg total) by mouth daily. 30 tablet 5   Facility-Administered Medications Prior to Visit  Medication Dose Route Frequency Provider Last Rate Last Admin   tezepelumab-ekko (TEZSPIRE) 210 MG/1.91ML syringe 210 mg  210 mg Subcutaneous Q28 days Kennith Gain, MD   210 mg at 07/16/21 4081   Allergies  Allergen Reactions   Bactrim Anaphylaxis   Bee Venom Hives   Chocolate Flavor Anaphylaxis   Clindamycin Shortness Of Breath   Coconut Oil Other (See Comments), Hives and Itching   Cymbalta [Duloxetine Hcl] Shortness Of Breath   Duloxetine Anaphylaxis   Other Hives, Other (See Comments), Rash and Itching    Insect stings Wine spirit   Peanut-Containing Drug Products Swelling, Other (See Comments), Itching, Shortness Of Breath and Hives   Ramipril Anaphylaxis   Sulfamethoxazole-Trimethoprim Anaphylaxis   Aspartame Other (See Comments)   Cranberry Extract Other (See Comments)   Elemental Sulfur Itching   Sulfur Itching and Other (See Comments)   Adhesive [Tape]    Amoxicillin    Ciprofloxacin    Demeclocycline    Dexlansoprazole    Doxycycline     Possibly joint pain or itch    Erythromycin     Upset stomach   Erythromycin Base    Esomeprazole    Hydrocodone Itching   Hydrocodone Bit-Homatrop Mbr     Not true allergy. "irritated"   Hydrocodone Bit-Homatrop Mbr    Hydromorphone Nausea Only   Levofloxacin     Weakness, myalgia   Levofloxacin    Lorazepam Other (See Comments)    Hallucination   Meloxicam Itching   Naproxen Itching   Naproxen Sodium Itching   Omeprazole-Sodium Bicarbonate     Chest pain-sore all over-HA   Ranitidine     Severe joint pain   Robaxin [Methocarbamol]     Made her very tired and unable to sleep at night   Tetracyclines & Related    Tizanidine     "knocked her out"- had a rebound migraine   Trazodone    Triamcinolone    Acetaminophen Rash   Etodolac Rash   Neosporin [Neomycin-Bacitracin Zn-Polymyx]  Rash    Objective:   Today's Vitals   07/23/21 0815  BP: 124/72  Pulse: 78  Temp: (!) 97 F (36.1 C)  TempSrc: Temporal  SpO2: 98%  Weight: 176 lb (79.8 kg)  Height: 5\' 3"  (1.6 m)  Body mass index is 31.18 kg/m.   General: Well developed, well nourished. No acute distress. Psych: Alert and oriented. Normal mood and affect.  Health Maintenance Due  Topic Date Due   Pneumococcal Vaccine 91-54 Years old (1 - PCV) Never done   HIV Screening  Never done   Hepatitis C Screening  Never done   Zoster Vaccines- Shingrix (1 of 2) Never done   PAP SMEAR-Modifier  Never done   COVID-19 Vaccine (3 - Pfizer risk series) 01/17/2020   MAMMOGRAM  Never done     Assessment & Plan:   1. Essential hypertension Ms. Coleman's Blood pressure is at goal today. We will continue her atenolol. as there was not adequate time to complete a full assessment of Ms. Coleman's medical conditions, and review of health maintenance issues, I will plan to have her back within a month to continue this process.  - atenolol (TENORMIN) 25 MG tablet; Take 1 tablet (25 mg total) by mouth daily as needed.  Dispense: 90 tablet; Refill: 3  2. Moderate persistent asthma without complication Ms. Samantha [name] will continue to follow with Dr. Neldon Mc. I will check screenign labs.  - CBC - Comprehensive metabolic panel  3. Chronic rhinitis Stable on a nasal steroid and Azelastine.  4. 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) Ultimately, I have concerns for Ms. Samantha [name] being on both a stimulant and benzodiazepines for managing her ADHD and her anxiety. Her former psychiatrist recently retired. I will refer her for psychiatry, as her management is outside of my experience.  - methylphenidate (RITALIN) 10 MG tablet; Take 1 tablet (10 mg total) by mouth 3 (three) times daily with meals.  Dispense: 90 tablet; Refill: 0 - Ambulatory referral to Psychiatry  5. Panic disorder with agoraphobia I will  continue these in the interim while we seek a psychiatrist o engage in her care.  - ALPRAZolam (XANAX) 1 MG tablet; Take 1 tablet (1 mg total) by mouth 2 (two) times daily as needed.  Dispense: 20 tablet; Refill: 1 - diazepam (VALIUM) 10 MG tablet; Take 1 tablet (10 mg total) by mouth at bedtime as needed for anxiety.  Dispense: 30 tablet; Refill: 1 - Ambulatory referral to Psychiatry  6. Migraine without status migrainosus, not intractable, unspecified migraine type Continue judicious use of sumatriptan.  7. Mixed hyperlipidemia I will reassess her lipids to determine the adequacy/need for statin therapy.  - Lipid panel  8. Prediabetes I will monitor her A1c.  - Hemoglobin A1c  9. Nutritional deficiency I will recheck these vitamin and mineral issues.  - VITAMIN D 25 Hydroxy (Vit-D Deficiency, Fractures) - Vitamin B12 - Magnesium  10. Primary insomnia Continue while seeking psychiatry assessment.  - diazepam (VALIUM) 10 MG tablet; Take 1 tablet (10 mg total) by mouth at bedtime as needed for anxiety.  Dispense: 30 tablet; Refill: 1  I spent 70 minutes reviewing past medical records, taking a history, reconciling the problem list and medication list, ordering meds and lab tests, developing an initial management plan and documenting the encounter.  Haydee Salter, MD

## 2021-07-25 ENCOUNTER — Encounter: Payer: Self-pay | Admitting: Family Medicine

## 2021-07-25 DIAGNOSIS — F4001 Agoraphobia with panic disorder: Secondary | ICD-10-CM

## 2021-07-25 DIAGNOSIS — F908 Attention-deficit hyperactivity disorder, other type: Secondary | ICD-10-CM

## 2021-07-25 DIAGNOSIS — I10 Essential (primary) hypertension: Secondary | ICD-10-CM

## 2021-07-25 DIAGNOSIS — F5101 Primary insomnia: Secondary | ICD-10-CM

## 2021-07-26 NOTE — Telephone Encounter (Signed)
Can you please and thank you resend the Ritalin to pharmacy due to it failed to go through to the pharmacy.   Thanks.  Dm/cma

## 2021-07-27 ENCOUNTER — Telehealth: Payer: Self-pay | Admitting: *Deleted

## 2021-07-27 MED ORDER — METHYLPHENIDATE HCL 10 MG PO TABS: 10.0000 mg | ORAL_TABLET | Freq: Three times a day (TID) | ORAL | 0 refills | Status: DC

## 2021-07-27 NOTE — Telephone Encounter (Signed)
Patient called and wanted to let you know that once she got her first Tezspire she experienced dizziness for 3-4 days along with joint pain and fatigue.

## 2021-07-28 ENCOUNTER — Other Ambulatory Visit: Payer: Self-pay | Admitting: Family Medicine

## 2021-07-28 DIAGNOSIS — F908 Attention-deficit hyperactivity disorder, other type: Secondary | ICD-10-CM

## 2021-07-28 DIAGNOSIS — F5101 Primary insomnia: Secondary | ICD-10-CM

## 2021-07-28 DIAGNOSIS — F4001 Agoraphobia with panic disorder: Secondary | ICD-10-CM

## 2021-07-28 DIAGNOSIS — I10 Essential (primary) hypertension: Secondary | ICD-10-CM

## 2021-07-28 MED ORDER — ALPRAZOLAM 1 MG PO TABS
1.0000 mg | ORAL_TABLET | Freq: Two times a day (BID) | ORAL | 1 refills | Status: DC | PRN
Start: 2021-07-28 — End: 2021-09-30

## 2021-07-28 MED ORDER — DIAZEPAM 10 MG PO TABS: 10.0000 mg | ORAL_TABLET | Freq: Every evening | ORAL | 1 refills | Status: DC | PRN

## 2021-07-28 MED ORDER — METHYLPHENIDATE HCL 10 MG PO TABS: 10.0000 mg | ORAL_TABLET | Freq: Three times a day (TID) | ORAL | 0 refills | Status: DC

## 2021-07-28 MED ORDER — ATENOLOL 25 MG PO TABS
25.0000 mg | ORAL_TABLET | Freq: Every day | ORAL | 3 refills | Status: DC | PRN
Start: 2021-07-28 — End: 2021-08-20

## 2021-07-28 NOTE — Addendum Note (Signed)
Addended by: Haydee Salter on: 07/28/2021 05:26 PM   Modules accepted: Orders

## 2021-07-28 NOTE — Addendum Note (Signed)
Addended by: Haydee Salter on: 07/28/2021 11:09 AM   Modules accepted: Orders

## 2021-07-29 MED ORDER — ALBUTEROL SULFATE HFA 108 (90 BASE) MCG/ACT IN AERS: 2.0000 | INHALATION_SPRAY | RESPIRATORY_TRACT | 1 refills | Status: DC | PRN

## 2021-07-29 NOTE — Addendum Note (Signed)
Addended by: Chip Boer R on: 07/29/2021 06:42 PM   Modules accepted: Orders

## 2021-07-29 NOTE — Telephone Encounter (Signed)
Called and spoke with patient and advised. Patient verbalized understanding.  ?

## 2021-07-30 ENCOUNTER — Other Ambulatory Visit: Payer: Self-pay | Admitting: Allergy and Immunology

## 2021-07-30 MED ORDER — METHYLPHENIDATE HCL 10 MG PO TABS: 10.0000 mg | ORAL_TABLET | Freq: Three times a day (TID) | ORAL | 0 refills | Status: DC

## 2021-07-30 NOTE — Telephone Encounter (Signed)
Spoke to [place] at [facility],  they ar out of stock on the Ritalin.   She was able to get the other prescriptions. Can you please try again to send to the [facility].  Thanks.

## 2021-07-30 NOTE — Addendum Note (Signed)
Addended by: Haydee Salter on: 07/30/2021 05:00 PM   Modules accepted: Orders

## 2021-08-09 ENCOUNTER — Other Ambulatory Visit: Payer: Self-pay | Admitting: *Deleted

## 2021-08-09 ENCOUNTER — Encounter: Payer: Self-pay | Admitting: Allergy and Immunology

## 2021-08-09 MED ORDER — BUDESONIDE 180 MCG/ACT IN AEPB: 2.0000 | INHALATION_SPRAY | Freq: Two times a day (BID) | RESPIRATORY_TRACT | 5 refills | Status: DC

## 2021-08-13 ENCOUNTER — Other Ambulatory Visit: Payer: Self-pay

## 2021-08-13 ENCOUNTER — Ambulatory Visit (INDEPENDENT_AMBULATORY_CARE_PROVIDER_SITE_OTHER): Payer: No Typology Code available for payment source

## 2021-08-13 DIAGNOSIS — J4550 Severe persistent asthma, uncomplicated: Secondary | ICD-10-CM

## 2021-08-13 NOTE — Progress Notes (Signed)
Spoke with patient to follow up with her after her reaction in the office today. Patient stated that she was feeling better. She stated that she went home eat, took a nap for a few hours and woke up with a headache and sore throat. She did inform me that she experienced the headache and sore throat last time. Patient stated she is very thankful for everything everyone did to help her today.

## 2021-08-13 NOTE — Progress Notes (Signed)
Patient came in today for her Tezspire injection. While asking her if she had any issues with her previous injection she expressed that she she did and informed her that she had a sore throat, body weakness, tingling in her fingers and toes, and dizziness. I asked patient if she called and she informed me that she did. I did check her chart and saw the telephone note from her calling. I also saw where Dr. Neldon Mc informed patient to continue on her Tezspire injections. I proceeded with her injections and informed her to stay with me for the thirty minutes in the injection room lobby. She complied and toward the end of her thirty minutes I checked on her and she informed me that her body was going numb. I asked her to follow me into the back to be evaluated by a physician. As we were walking to room four she began to have trouble walking and I assisted her to the room ad got help from [facility]. I notified Webb Silversmith that I had a reaction patient and left patient under the Care of Webb Silversmith and [facility].

## 2021-08-18 NOTE — Progress Notes (Signed)
Patient with symptoms including right sided weakness after receiving a Tezspire injection. She initially required help walking from the reception area to a room for further assessment. Initially she denied cardiopulmonary, integumentary, and gastrointestinal symptoms.  Her initial vital signs were stable.  She did have weakness in her right hand and right leg.  Upon reentry into her room she was leaning over the garbage can and dry heaving as well as vomiting.  At that time she demonstrated bilateral expiratory wheeze.  She was given epinephrine and albuterol with resolution of symptoms.  Her vital signs remained stable throughout the exam and wheezing cleared post bronchodilator therapy.    I strongly recommended that she go to ED or urgent care to have a formal neuro evaluation to determine the cause of right-sided weakness. She reported that she is having an issue with low potassium and thinks that may be what is causing her right-sided weakness.  Vital signs and physical exam remained stable.  A brief neuro exam revealed equal strength in right and left arms and right and left legs.  She was strongly encouraged to go to the emergency department for evaluation of right-sided weakness.

## 2021-08-20 ENCOUNTER — Ambulatory Visit (INDEPENDENT_AMBULATORY_CARE_PROVIDER_SITE_OTHER): Payer: No Typology Code available for payment source | Admitting: Family Medicine

## 2021-08-20 ENCOUNTER — Other Ambulatory Visit: Payer: Self-pay

## 2021-08-20 VITALS — BP 124/78 | HR 75 | Temp 97.8°F | Ht 63.0 in | Wt 171.0 lb

## 2021-08-20 DIAGNOSIS — E782 Mixed hyperlipidemia: Secondary | ICD-10-CM

## 2021-08-20 DIAGNOSIS — R7303 Prediabetes: Secondary | ICD-10-CM

## 2021-08-20 DIAGNOSIS — R918 Other nonspecific abnormal finding of lung field: Secondary | ICD-10-CM

## 2021-08-20 DIAGNOSIS — F908 Attention-deficit hyperactivity disorder, other type: Secondary | ICD-10-CM

## 2021-08-20 DIAGNOSIS — R32 Unspecified urinary incontinence: Secondary | ICD-10-CM

## 2021-08-20 DIAGNOSIS — Z91410 Personal history of adult physical and sexual abuse: Secondary | ICD-10-CM | POA: Insufficient documentation

## 2021-08-20 DIAGNOSIS — I10 Essential (primary) hypertension: Secondary | ICD-10-CM | POA: Diagnosis not present

## 2021-08-20 DIAGNOSIS — N1831 Chronic kidney disease, stage 3a: Secondary | ICD-10-CM

## 2021-08-20 MED ORDER — METHYLPHENIDATE HCL 10 MG PO TABS: 10.0000 mg | ORAL_TABLET | Freq: Three times a day (TID) | ORAL | 0 refills | Status: DC

## 2021-08-20 MED ORDER — ATENOLOL 25 MG PO TABS
12.5000 mg | ORAL_TABLET | Freq: Every day | ORAL | 3 refills | Status: DC | PRN
Start: 2021-08-20 — End: 2022-08-22

## 2021-08-20 NOTE — Progress Notes (Signed)
[facility] [address] [place] [address] Dept: [phone] Dept Fax: [phone]  Chronic Care Office Visit  Subjective:    Patient ID: Samantha [name], female    DOB: [birth year], 50 y.o..   MRN: [id number]  Chief Complaint  Patient presents with   Follow-up    4 week f/u meds.      History of Present Illness:  Patient is in today for ongoing review of her chronic medical conditions as part of her establishing care with me. Since her last visit, [name] notes that she seems to be having some reaction to her Tezspire (tezepelumab). It is unclear if she will be able to continue to receive these injections. However, she has been able to reduce her ICS use.  [name] had been having some issues with bradycardia, apparently related to her atenolol dose. Her nephrologist (Colodonato) reduced her dose to 12.5 mg daily. [name] notes that the labs at the nephrology office had shown her GFR to be in the 36s. She did share the results she had bene having in our practice. She notes the nephrologist had her stop her potassium supplements.  [name] has a history of urinary incontinence. She notes she had previously needed ot have dilation of her urethral sphincter. She has been recently referred to a new urologist by her nephrologist.  [name] has a history of pulmonary nodules that were incidentally picked up on a CT scan of the abdomen. She notes these have not been followed up.  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 She notes this was recently reassessed and it was confirmed that she has no residual/chronic infection.  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 She notes this was discovered  when she was pregnant. She had a prior miscarriage. She currently is maintained on a daily aspirin.  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) 11/21/2018   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/2018   Pulmonary nodules 11/13/2018   Other microscopic hematuria 11/06/2018   Anxiety and depression 07/04/2018   Fecal incontinence 07/02/2018   Leg weakness 06/12/2017   Ataxic gait 02/07/2017   Left ear hearing loss 02/07/2017   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[place]) 12/20/2016   Mixed hyperlipidemia 11/08/2016   Acute idiopathic gout of multiple sites 11/08/2016   Elbow pain, chronic, right 10/06/2016   Lateral epicondylitis, right elbow 10/06/2016   Fibromyalgia 10/04/2016   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   Migraine headache 07/29/2016   Irritable bowel syndrome with both constipation and diarrhea 07/29/2016   Hives 07/18/2016   Urinary incontinence 06/02/2016   Atherosclerosis of abdominal aorta ([place]) 09/03/2015   Carpal tunnel syndrome 11/19/2014   Primary hypersomnia 11/18/2014   Insomnia 11/18/2014   Lumbar radicular pain 11/18/2014   Chronic pain syndrome 11/18/2014   Cervical disc disorder with radiculopathy 11/18/2014   Chronic female pelvic pain 07/31/2014   Degeneration of intervertebral disc of lumbosacral region 07/31/2014   Rosacea 07/31/2014   Morton's neuroma 07/31/2014   Benign paroxysmal positional vertigo 07/31/2014   Essential hypertension 07/31/2014   History of  methicillin resistant Staphylococcus aureus infection 07/09/2014   Past Surgical History:  Procedure Laterality Date   BLADDER SURGERY     age 58- 76   CERVICAL FUSION     08/1998, 03/10/2017   CESAREAN SECTION     COLONOSCOPY      HAND SURGERY     2012, 2013   LESION REMOVAL  08/23/2012   Procedure: LESION REMOVAL HAND;  Surgeon: Cammie Sickle., MD;  Location: [facility];  Service: Orthopedics;  Laterality: Left;   removal of ovary Right    ROTATOR CUFF REPAIR     UPPER GASTROINTESTINAL ENDOSCOPY     Family History  Problem Relation Age of Onset   ADD / ADHD Brother    Stomach cancer Maternal Grandfather    Colon cancer Neg Hx    Esophageal cancer Neg Hx    Rectal cancer Neg Hx    Outpatient Medications Prior to Visit  Medication Sig Dispense Refill   albuterol (VENTOLIN HFA) 108 (90 Base) MCG/ACT inhaler Inhale 2 puffs into the lungs every 4 (four) hours as needed for wheezing or shortness of breath. 18 g 1   ALPRAZolam (XANAX) 1 MG tablet Take 1 tablet (1 mg total) by mouth 2 (two) times daily as needed. 20 tablet 1   Aspirin (VAZALORE) 81 MG CAPS Take by mouth.     azelastine (ASTELIN) 0.1 % nasal spray Place 1 spray into both nostrils 2 (two) times daily as needed for rhinitis. 30 mL 5   budesonide (PULMICORT) 180 MCG/ACT inhaler Inhale 2 puffs into the lungs in the morning and at bedtime. 1 each 5   cholecalciferol (VITAMIN D) 1000 UNITS tablet Take by mouth Nightly.     diazepam (VALIUM) 10 MG tablet Take 1 tablet (10 mg total) by mouth at bedtime as needed for anxiety. 30 tablet 1   diphenhydrAMINE (BENADRYL) 25 MG tablet Take 25 mg by mouth every 6 (six) hours as needed.     EPINEPHrine (PRIMATENE MIST) 0.125 MG/ACT AERO Inhale into the lungs.     EPIPEN 2-PAK 0.3 MG/0.3ML SOAJ injection INJECT INTO THE MUSCLE ONCE FOR ALLERGICREACTION 2 each 1   estradiol (ESTRACE) 0.1 MG/GM vaginal cream Place vaginally.     MAGNESIUM PO Take by mouth as needed.     rosuvastatin (CRESTOR) 10 MG tablet Take 10 mg by mouth.     SUMAtriptan (IMITREX) 50 MG tablet Take by mouth.     triamcinolone (NASACORT) 55 MCG/ACT AERO nasal inhaler spray 1 spray each nostril 1-2 times per day 16.5 g 5   atenolol  (TENORMIN) 25 MG tablet Take 1 tablet (25 mg total) by mouth daily as needed. (Patient taking differently: Take 25 mg by mouth daily as needed. Taking 1/2 tab daily) 90 tablet 3   methylphenidate (RITALIN) 10 MG tablet Take 1 tablet (10 mg total) by mouth 3 (three) times daily with meals. 90 tablet 0   beclomethasone (QVAR REDIHALER) 80 MCG/ACT inhaler Inhale 2 inhalations 1-2 times per day 10.6 g 3   Benzonatate 150 MG CAPS Take by mouth.     Levonorgestrel 13.5 MG IUD by Intrauterine route.     Facility-Administered Medications Prior to Visit  Medication Dose Route Frequency Provider Last Rate Last Admin   tezepelumab-ekko (TEZSPIRE) 210 MG/1.91ML syringe 210 mg  210 mg Subcutaneous Q28 days Kennith Gain, MD   210 mg at 08/13/21 1610   Allergies  Allergen Reactions   Bactrim Anaphylaxis   Bee Venom Hives  Chocolate Flavor Anaphylaxis   Clindamycin Shortness Of Breath   Coconut Oil Other (See Comments), Hives and Itching   Cymbalta [Duloxetine Hcl] Shortness Of Breath   Duloxetine Anaphylaxis   Other Hives, Other (See Comments), Rash and Itching    Insect stings Wine spirit   Peanut-Containing Drug Products Swelling, Other (See Comments), Itching, Shortness Of Breath and Hives   Ramipril Anaphylaxis   Sulfamethoxazole-Trimethoprim Anaphylaxis   Aspartame Other (See Comments)   Cranberry Extract Other (See Comments)   Elemental Sulfur Itching   Sulfur Itching and Other (See Comments)   Adhesive [Tape]    Amoxicillin    Ciprofloxacin    Demeclocycline    Dexlansoprazole    Doxycycline     Possibly joint pain or itch    Erythromycin     Upset stomach   Erythromycin Base    Esomeprazole    Hydrocodone Itching   Hydrocodone Bit-Homatrop Mbr     Not true allergy. "irritated"   Hydrocodone Bit-Homatrop Mbr    Hydromorphone Nausea Only   Levofloxacin     Weakness, myalgia   Levofloxacin    Lorazepam Other (See Comments)    Hallucination   Meloxicam Itching    Naproxen Itching   Naproxen Sodium Itching   Omeprazole-Sodium Bicarbonate     Chest pain-sore all over-HA   Ranitidine     Severe joint pain   Robaxin [Methocarbamol]     Made her very tired and unable to sleep at night   Tetracyclines & Related    Tizanidine     "knocked her out"- had a rebound migraine   Trazodone    Triamcinolone    Acetaminophen Rash   Etodolac Rash   Neosporin [Neomycin-Bacitracin Zn-Polymyx] Rash   Objective:   Today's Vitals   08/20/21 1030  BP: 124/78  Pulse: 75  Temp: 97.8 F (36.6 C)  TempSrc: Temporal  SpO2: 95%  Weight: 171 lb (77.6 kg)  Height: 5\' 3"  (1.6 m)   Body mass index is 30.29 kg/m.   General: Well developed, well nourished. No acute distress. Psych: Alert and oriented. Normal mood and affect.  Health Maintenance Due  Topic Date Due   Pneumococcal Vaccine 24-92 Years old (1 - PCV) Never done   HIV Screening  Never done   Hepatitis C Screening  Never done   Zoster Vaccines- Shingrix (1 of 2) Never done   PAP SMEAR-Modifier  Never done   COVID-19 Vaccine (3 - Pfizer risk series) 01/17/2020   MAMMOGRAM  Never done   Lab Results Last CBC Lab Results  Component Value Date   WBC 5.2 07/23/2021   HGB 13.4 07/23/2021   HCT 39.8 07/23/2021   MCV 90.2 07/23/2021   MCH 31.0 11/25/2011   RDW 13.3 07/23/2021   PLT 202.0 91/47/8295   Last metabolic panel Lab Results  Component Value Date   GLUCOSE 103 (H) 07/23/2021   NA 142 07/23/2021   K 4.0 07/23/2021   CL 104 07/23/2021   CO2 32 07/23/2021   BUN 20 07/23/2021   CREATININE 1.09 07/23/2021   GFRNONAA >90 11/25/2011   CALCIUM 9.5 07/23/2021   PROT 6.4 07/23/2021   ALBUMIN 4.3 07/23/2021   BILITOT 0.4 07/23/2021   ALKPHOS 72 07/23/2021   AST 14 07/23/2021   ALT 13 07/23/2021   Last lipids Lab Results  Component Value Date   CHOL 180 07/23/2021   HDL 62.20 07/23/2021   LDLCALC 105 (H) 07/23/2021   TRIG 65.0 07/23/2021   CHOLHDL 3  07/23/2021   Last hemoglobin  A1c Lab Results  Component Value Date   HGBA1C 6.0 07/23/2021   Last vitamin D Lab Results  Component Value Date   VD25OH 47.06 07/23/2021   Last vitamin B12 and Folate Lab Results  Component Value Date   VITAMINB12 238 07/23/2021     Imaging: CT Abdomen and Pelvis wo contrast (11/06/2018) IMPRESSION: No CT evidence for nephrolithiasis or obstructive uropathy. No findings to explain patient's symptoms identified. No other acute intra-abdominal or pelvic process. Small fat containing paraumbilical hernia without associated inflammation. Mild aortic atherosclerosis. 4 mm right middle lobe nodule, partially visualized, but indeterminate. No follow-up needed if patient is low-risk. Non-contrast chest CT can be considered in 12 months if patient is high-risk.  Assessment & Plan:   1. Essential hypertension Blood pressure remains at goal, even on the lower dose of atenolol.  - atenolol (TENORMIN) 25 MG tablet; Take 0.5 tablets (12.5 mg total) by mouth daily as needed. Taking 1/2 tab daily  Dispense: 45 tablet; Refill: 3 2. Mixed hyperlipidemia Last LDL was mildly high, but [name] admits that she ahd not been takign her rosuvastaitn daily. She has now resumed this daily. She notes that she has diffuse achiness, but is tolerating. She states she is unable to take Co-Q10.  3. Prediabetes Discussed A1c remains in prediabetes range. We willc otninue to monitor this.  4. Pulmonary nodules In light of Ms. Coleman's concern, I will request a follow-up CT to confirm stability of this nodule. - CT CHEST NODULE FOLLOW UP LOW DOSE W/O; Future  5. Urinary incontinence, unspecified type Referred to urology.  6. 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) I will renew her Adderall.  - methylphenidate (RITALIN) 10 MG tablet; Take 1 tablet (10 mg total) by mouth 3 (three) times daily with meals.  Dispense: 90 tablet; Refill: 0  7. Chronic kidney disease, stage 3a  ([employer]) Blood pressure at goal. I recommended MS. Coleman avoid OTC NSAIDs.  Haydee Salter, MD

## 2021-08-23 ENCOUNTER — Telehealth: Payer: Self-pay | Admitting: Family Medicine

## 2021-08-23 NOTE — Telephone Encounter (Signed)
Samone from [facility] is needing pt's referral to state-CT Chest without. Phone [phone].

## 2021-08-25 NOTE — Telephone Encounter (Signed)
That is what was ordered. Dm/cma

## 2021-08-30 ENCOUNTER — Encounter: Payer: Self-pay | Admitting: Family Medicine

## 2021-08-30 DIAGNOSIS — R918 Other nonspecific abnormal finding of lung field: Secondary | ICD-10-CM

## 2021-08-30 NOTE — Addendum Note (Signed)
Addended by: Haydee Salter on: 08/30/2021 02:40 PM   Modules accepted: Orders

## 2021-09-07 ENCOUNTER — Encounter: Payer: Self-pay | Admitting: Allergy and Immunology

## 2021-09-07 ENCOUNTER — Other Ambulatory Visit: Payer: Self-pay

## 2021-09-07 ENCOUNTER — Ambulatory Visit (INDEPENDENT_AMBULATORY_CARE_PROVIDER_SITE_OTHER): Payer: No Typology Code available for payment source | Admitting: Allergy and Immunology

## 2021-09-07 VITALS — BP 130/82 | HR 79 | Temp 97.2°F | Resp 16 | Ht 63.0 in | Wt 169.8 lb

## 2021-09-07 DIAGNOSIS — K219 Gastro-esophageal reflux disease without esophagitis: Secondary | ICD-10-CM | POA: Diagnosis not present

## 2021-09-07 DIAGNOSIS — J4550 Severe persistent asthma, uncomplicated: Secondary | ICD-10-CM | POA: Diagnosis not present

## 2021-09-07 DIAGNOSIS — R232 Flushing: Secondary | ICD-10-CM

## 2021-09-07 DIAGNOSIS — J383 Other diseases of vocal cords: Secondary | ICD-10-CM

## 2021-09-07 DIAGNOSIS — L500 Allergic urticaria: Secondary | ICD-10-CM | POA: Diagnosis not present

## 2021-09-07 MED ORDER — ALBUTEROL SULFATE HFA 108 (90 BASE) MCG/ACT IN AERS: 2.0000 | INHALATION_SPRAY | RESPIRATORY_TRACT | 1 refills | Status: DC | PRN

## 2021-09-07 MED ORDER — TRIAMCINOLONE ACETONIDE 55 MCG/ACT NA AERO: INHALATION_SPRAY | NASAL | 5 refills | Status: DC

## 2021-09-07 MED ORDER — DIPHENHYDRAMINE HCL 25 MG PO TABS: 25.0000 mg | ORAL_TABLET | Freq: Four times a day (QID) | ORAL | 1 refills | Status: DC | PRN

## 2021-09-07 MED ORDER — AZELASTINE HCL 0.1 % NA SOLN: 1.0000 | Freq: Two times a day (BID) | NASAL | 5 refills | Status: DC | PRN

## 2021-09-07 MED ORDER — BUDESONIDE 180 MCG/ACT IN AEPB: 2.0000 | INHALATION_SPRAY | Freq: Two times a day (BID) | RESPIRATORY_TRACT | 5 refills | Status: DC

## 2021-09-07 NOTE — Progress Notes (Signed)
[facility] [place] [facility] [place]   Follow-up Note  Referring Provider: No ref. provider found Primary Provider: Haydee Salter, MD Date of Office Visit: 09/07/2021  Subjective:   Samantha [name] (DOB: 1970/10/27) is a 50 y.o. female who returns to the [facility] on 09/07/2021 in re-evaluation of the following:  HPI: Howard returns to this clinic in reevaluation of severe persistent asthma, allergic rhinitis, LPR, vocal cord dysfunction, and history of urticaria/flushing disorder.  Her last visit to this clinic was 15 June 2021.  Because she has had so many side effects from every medication that we have administered to her in the past except for as select few medications we have started her on anti-TSLP antibody.  Unfortunately, with her first injection in November 2022 she developed left arm weakness and then left leg weakness and then could not walk and then became dizzy and had a headache and was itching all over.  On her second injection ,within 15 minutes, she developed right arm weakness and she was taken back to the clinic with lots of breathing problems and coughing and vomiting and she was given epinephrine and nebulized bronchodilator and then she had lots of itchy skin and a sore throat and back pain that went on for weeks.  She has been using Pulmicort as she has been intolerant of using any combination inhaler.  She still must use a bronchodilator on occasion but overall the anti-TSLP antibody has actually helped her asthma.  Her nose is doing okay on her current sprays.  She has not been having too much problems with reflux.  She has not been having too much problem with her urticaria and flushing.  Allergies as of 09/07/2021       Reactions   Bactrim Anaphylaxis   Bee Venom Hives   Chocolate Flavor Anaphylaxis   Clindamycin Shortness Of Breath   Coconut Oil Other (See Comments), Hives, Itching   Cymbalta [duloxetine  Hcl] Shortness Of Breath   Duloxetine Anaphylaxis   Other Hives, Other (See Comments), Rash, Itching   Insect stings Wine spirit   Peanut-containing Drug Products Swelling, Other (See Comments), Itching, Shortness Of Breath, Hives   Potassium-containing Compounds Anaphylaxis, Itching, Swelling   Ramipril Anaphylaxis   Sulfamethoxazole-trimethoprim Anaphylaxis   Aspartame Other (See Comments)   Cranberry Extract Other (See Comments)   Elemental Sulfur Itching   Sulfur Itching, Other (See Comments)   Adhesive [tape]    Amoxicillin    Ciprofloxacin    Demeclocycline    Dexlansoprazole    Doxycycline    Possibly joint pain or itch   Erythromycin    Upset stomach   Erythromycin Base    Esomeprazole    Hydrocodone Itching   Hydrocodone Bit-homatrop Mbr    Not true allergy. "irritated"   Hydrocodone Bit-homatrop Mbr    Hydromorphone Nausea Only   Levofloxacin    Weakness, myalgia   Levofloxacin    Lorazepam Other (See Comments)   Hallucination   Meloxicam Itching   Naproxen Itching   Naproxen Sodium Itching   Omeprazole-sodium Bicarbonate    Chest pain-sore all over-HA   Ranitidine    Severe joint pain   Robaxin [methocarbamol]    Made her very tired and unable to sleep at night   Tetracyclines & Related    Tizanidine    "knocked her out"- had a rebound migraine   Trazodone    Triamcinolone    Acetaminophen Rash   Etodolac  Rash   Neosporin [neomycin-bacitracin Zn-polymyx] Rash        Medication List    albuterol 108 (90 Base) MCG/ACT inhaler Commonly known as: VENTOLIN HFA Inhale 2 puffs into the lungs every 4 (four) hours as needed for wheezing or shortness of breath.   ALPRAZolam 1 MG tablet Commonly known as: XANAX Take 1 tablet (1 mg total) by mouth 2 (two) times daily as needed.   atenolol 25 MG tablet Commonly known as: TENORMIN Take 0.5 tablets (12.5 mg total) by mouth daily as needed. Taking 1/2 tab daily   azelastine 0.1 % nasal spray Commonly  known as: ASTELIN Place 1 spray into both nostrils 2 (two) times daily as needed for rhinitis.   budesonide 180 MCG/ACT inhaler Commonly known as: PULMICORT Inhale 2 puffs into the lungs in the morning and at bedtime.   cholecalciferol 1000 units tablet Commonly known as: VITAMIN D Take by mouth Nightly.   diazepam 10 MG tablet Commonly known as: VALIUM Take 1 tablet (10 mg total) by mouth at bedtime as needed for anxiety.   diphenhydrAMINE 25 MG tablet Commonly known as: BENADRYL Take 25 mg by mouth every 6 (six) hours as needed.   EpiPen 2-Pak 0.3 mg/0.3 mL Soaj injection Generic drug: EPINEPHrine INJECT INTO THE MUSCLE ONCE FOR ALLERGICREACTION   estradiol 0.1 MG/GM vaginal cream Commonly known as: ESTRACE Place vaginally.   MAGNESIUM PO Take by mouth as needed.   methylphenidate 10 MG tablet Commonly known as: Ritalin Take 1 tablet (10 mg total) by mouth 3 (three) times daily with meals.   Pamabrom 50 MG Caps Take 1 capsule by mouth daily as needed.   Primatene Mist 0.125 MG/ACT Aero Generic drug: EPINEPHrine Inhale into the lungs.   rosuvastatin 10 MG tablet Commonly known as: CRESTOR Take 10 mg by mouth.   SUMAtriptan 50 MG tablet Commonly known as: IMITREX Take by mouth.   triamcinolone 55 MCG/ACT Aero nasal inhaler Commonly known as: NASACORT spray 1 spray each nostril 1-2 times per day   [place] 81 MG Caps Generic drug: Aspirin Take by mouth.    Past Medical History:  Diagnosis Date   ADD (attention deficit disorder)    Allergy    Anaphylaxis    Arthritis    Asthma    Chronic back pain    Chronic neck pain    Endometriosis    Fibromyalgia    GERD (gastroesophageal reflux disease)    Hernia    Hyperlipidemia    Hypertension    IBS (irritable bowel syndrome)    IC (interstitial cystitis)    Low blood potassium    Multiple allergies    OCD (obsessive compulsive disorder)    Raynaud disease    Vertigo     Past Surgical History:   Procedure Laterality Date   BLADDER SURGERY     age 62- 79   CERVICAL FUSION     08/1998, 03/10/2017   CESAREAN SECTION     COLONOSCOPY     HAND SURGERY     2012, 2013   LESION REMOVAL  08/23/2012   Procedure: LESION REMOVAL HAND;  Surgeon: Cammie Sickle., MD;  Location: [facility];  Service: Orthopedics;  Laterality: Left;   removal of ovary Right    ROTATOR CUFF REPAIR     UPPER GASTROINTESTINAL ENDOSCOPY      Review of systems negative except as noted in HPI / PMHx or noted below:  Review of Systems  Constitutional: Negative.  HENT: Negative.    Eyes: Negative.   Respiratory: Negative.    Cardiovascular: Negative.   Gastrointestinal: Negative.   Genitourinary: Negative.   Musculoskeletal: Negative.   Skin: Negative.   Neurological: Negative.   Endo/Heme/Allergies: Negative.   Psychiatric/Behavioral: Negative.      Objective:   Vitals:   09/07/21 1451  BP: 130/82  Pulse: 79  Resp: 16  Temp: (!) 97.2 F (36.2 C)  SpO2: 97%   Height: 5\' 3"  (160 cm)  Weight: 169 lb 12.8 oz (77 kg)   Physical Exam Constitutional:      Appearance: She is not diaphoretic.  HENT:     Head: Normocephalic.     Right Ear: Tympanic membrane, ear canal and external ear normal.     Left Ear: Tympanic membrane, ear canal and external ear normal.     Nose: Nose normal. No mucosal edema or rhinorrhea.     Mouth/Throat:     Pharynx: Uvula midline. No oropharyngeal exudate.  Eyes:     Conjunctiva/sclera: Conjunctivae normal.  Neck:     Thyroid: No thyromegaly.     Trachea: Trachea normal. No tracheal tenderness or tracheal deviation.  Cardiovascular:     Rate and Rhythm: Normal rate and regular rhythm.     Heart sounds: Normal heart sounds, S1 normal and S2 normal. No murmur heard. Pulmonary:     Effort: No respiratory distress.     Breath sounds: Normal breath sounds. No stridor. No wheezing or rales.  Lymphadenopathy:     Head:     Right side of head: No  tonsillar adenopathy.     Left side of head: No tonsillar adenopathy.     Cervical: No cervical adenopathy.  Skin:    Findings: No erythema or rash.     Nails: There is no clubbing.  Neurological:     Mental Status: She is alert.    Diagnostics: Spirometry was performed and demonstrated an FEV1 of 2.03 at 76 % of predicted.  Assessment and Plan:   1. Not well controlled severe persistent asthma   2. Vocal cord dysfunction   3. LPRD (laryngopharyngeal reflux disease)   4. Allergic urticaria   5. Flushing      1. Continue treatment of asthma:   A. Pulmicort 160 - 2 inhalations 2 times per day  2. Continue treatment of allergic rhinitis:   A. OTC Nasacort - 1 spray each nostril 1-2 times per day  B. Azelastine nasal spray - 1 spray each nostril 1-2 times per day  3. Treat reflux:   A. Continue Gaviscon  3. Can continue over-the-counter antihistamine, Auvi-Q 0.3, Albuterol if needed.  4. Evaluation at [facility] allergy department  5. Return to clinic in 12 weeks or earlier if problem  We have had O[place] undergo extensive evaluation for her multiple respiratory and cutaneous abnormalities and fortunately she does not appear to have any systemic disease contributing to these issues.  However, we have been unsuccessful in controlling these issues with her current medical plan including use of biologic agents basically because she has side effects with every agent we have administered in the past except for a select few agents.  I am going to have her evaluated at the academic [facility] at this point in time to see if we can get better hold on what is going on with [place] and to explore the possibility of a systemic disease contributing to some of her respiratory tract issues and urticaria and flushing episodes.  We  will see if we can get that appointment arranged sometime within the next few months.    Allena Katz, MD Allergy / Immunology [place]

## 2021-09-07 NOTE — Patient Instructions (Signed)
°  1. Continue treatment of asthma:   A. Pulmicort 160 - 2 inhalations 2 times per day  2. Continue treatment of allergic rhinitis:   A. OTC Nasacort - 1 spray each nostril 1-2 times per day  B. Azelastine nasal spray - 1 spray each nostril 1-2 times per day  3. Treat reflux:   A. Continue Gaviscon  3. Can continue over-the-counter antihistamine, Auvi-Q 0.3, Albuterol if needed.  4. Evaluation at [facility] allergy department  5. Return to clinic in 12 weeks or earlier if problem

## 2021-09-08 ENCOUNTER — Other Ambulatory Visit: Payer: Self-pay | Admitting: *Deleted

## 2021-09-08 ENCOUNTER — Encounter: Payer: Self-pay | Admitting: Allergy and Immunology

## 2021-09-08 ENCOUNTER — Telehealth: Payer: Self-pay | Admitting: Allergy and Immunology

## 2021-09-08 MED ORDER — EPIPEN 2-PAK 0.3 MG/0.3ML IJ SOAJ: INTRAMUSCULAR | 1 refills | Status: DC

## 2021-09-08 MED ORDER — LEVALBUTEROL TARTRATE 45 MCG/ACT IN AERO: 2.0000 | INHALATION_SPRAY | RESPIRATORY_TRACT | 1 refills | Status: DC | PRN

## 2021-09-08 NOTE — Telephone Encounter (Signed)
Pharmacist from [facility] will not cover for ventolin hfa and their preferred inhaler is the levalbuterol.   Could the levalbuterol be sent in instead?

## 2021-09-08 NOTE — Telephone Encounter (Signed)
New prescription has been sent in. Called the patient and advised. Patient verbalized understanding.

## 2021-09-14 ENCOUNTER — Ambulatory Visit: Payer: No Typology Code available for payment source

## 2021-09-15 ENCOUNTER — Encounter: Payer: Self-pay | Admitting: Family Medicine

## 2021-09-15 ENCOUNTER — Other Ambulatory Visit: Payer: Self-pay

## 2021-09-15 DIAGNOSIS — N3010 Interstitial cystitis (chronic) without hematuria: Secondary | ICD-10-CM | POA: Insufficient documentation

## 2021-09-15 DIAGNOSIS — I739 Peripheral vascular disease, unspecified: Secondary | ICD-10-CM

## 2021-09-15 DIAGNOSIS — N3582 Other urethral stricture, female: Secondary | ICD-10-CM | POA: Insufficient documentation

## 2021-09-21 ENCOUNTER — Encounter: Payer: Self-pay | Admitting: Family Medicine

## 2021-09-21 MED ORDER — SUMATRIPTAN SUCCINATE 50 MG PO TABS: 50.0000 mg | ORAL_TABLET | ORAL | 0 refills | Status: DC | PRN

## 2021-09-21 NOTE — Telephone Encounter (Signed)
Refill request for: Sumatriptan 50 mg  LR HX provider LOV 08/20/21 FOV  10/21/21  Please review and advise.  Thanks. Dm/cma

## 2021-09-22 ENCOUNTER — Other Ambulatory Visit: Payer: Self-pay | Admitting: Family Medicine

## 2021-09-22 DIAGNOSIS — F908 Attention-deficit hyperactivity disorder, other type: Secondary | ICD-10-CM

## 2021-09-22 MED ORDER — METHYLPHENIDATE HCL 10 MG PO TABS: 10.0000 mg | ORAL_TABLET | Freq: Three times a day (TID) | ORAL | 0 refills | Status: DC

## 2021-09-22 NOTE — Telephone Encounter (Signed)
Refill request for: Ritalin 10 mg LR 12/+9/22, #90, 0 rf LOV 08/20/21 FOV 10/21/21  Please review and advise.  Thanks. Dm/cma

## 2021-09-28 ENCOUNTER — Encounter: Payer: No Typology Code available for payment source | Admitting: Vascular Surgery

## 2021-09-28 ENCOUNTER — Encounter (HOSPITAL_COMMUNITY): Payer: No Typology Code available for payment source

## 2021-09-30 ENCOUNTER — Other Ambulatory Visit: Payer: Self-pay | Admitting: Family Medicine

## 2021-09-30 DIAGNOSIS — F5101 Primary insomnia: Secondary | ICD-10-CM

## 2021-09-30 DIAGNOSIS — F4001 Agoraphobia with panic disorder: Secondary | ICD-10-CM

## 2021-09-30 NOTE — Telephone Encounter (Signed)
Refill request for:  Alprazolam 1 mg  LR 07/28/21, #20, 1 rf  Diazapem 10 mg LR 07/28/21, #30, 1 rf LOV 08/20/21 FOV  10/21/21  Please review and advise.   Thanks.  Dm/cma

## 2021-10-06 LAB — HM MAMMOGRAPHY

## 2021-10-19 ENCOUNTER — Other Ambulatory Visit: Payer: Self-pay

## 2021-10-19 ENCOUNTER — Encounter: Payer: Self-pay | Admitting: Vascular Surgery

## 2021-10-19 ENCOUNTER — Ambulatory Visit (HOSPITAL_COMMUNITY)
Admission: RE | Admit: 2021-10-19 | Discharge: 2021-10-19 | Disposition: A | Discharge status: Home / Self Care | Payer: No Typology Code available for payment source | Source: Ambulatory Visit | Attending: Vascular Surgery | Admitting: Vascular Surgery

## 2021-10-19 ENCOUNTER — Ambulatory Visit (INDEPENDENT_AMBULATORY_CARE_PROVIDER_SITE_OTHER): Payer: No Typology Code available for payment source | Admitting: Vascular Surgery

## 2021-10-19 DIAGNOSIS — I739 Peripheral vascular disease, unspecified: Secondary | ICD-10-CM | POA: Insufficient documentation

## 2021-10-19 DIAGNOSIS — M7989 Other specified soft tissue disorders: Secondary | ICD-10-CM

## 2021-10-19 NOTE — Progress Notes (Signed)
Patient name: Samantha [name] MRN: [id number] DOB: 01/17/1971 Sex: female  REASON FOR CONSULT: Evaluate for PVD  HPI: Samantha [name] is a 51 y.o. female, with history of HTN, HLD, IC, fibromyalgia that presents for evaluation of peripheral vascular disease.  Patient is referred by Dr. Genevie Ann and [facility] where she is followed for hematuria.  She complains of slow to heal wound on the left shin after hitting her leg against a dog crate about a year ago.  She also gets paresthesias in the bottom of her feet that feels like she is walking on glass.  Shes had no previous vascular interventions.  She also complains of years of lower extremity swelling.  She is wearing compression stockings and does not feel they fit well.  No history of DVT.  Past Medical History:  Diagnosis Date   ADD (attention deficit disorder)    Allergy    Anaphylaxis    Arthritis    Asthma    Chronic back pain    Chronic neck pain    Endometriosis    Fibromyalgia    GERD (gastroesophageal reflux disease)    Hernia    Hyperlipidemia    Hypertension    IBS (irritable bowel syndrome)    IC (interstitial cystitis)    Low blood potassium    Multiple allergies    OCD (obsessive compulsive disorder)    Raynaud disease    Vertigo     Past Surgical History:  Procedure Laterality Date   BLADDER SURGERY     age 42- 21   CERVICAL FUSION     08/1998, 03/10/2017   CESAREAN SECTION     COLONOSCOPY     HAND SURGERY     2012, 2013   LESION REMOVAL  08/23/2012   Procedure: LESION REMOVAL HAND;  Surgeon: Cammie Sickle., MD;  Location: [facility];  Service: Orthopedics;  Laterality: Left;   removal of ovary Right    ROTATOR CUFF REPAIR     UPPER GASTROINTESTINAL ENDOSCOPY      Family History  Problem Relation Age of Onset   ADD / ADHD Brother    Stomach cancer Maternal Grandfather    Colon cancer Neg Hx    Esophageal cancer Neg Hx    Rectal cancer Neg Hx      SOCIAL HISTORY: Social History   Socioeconomic History   Marital status: Married    Spouse name: Not on file   Number of children: 1   Years of education: Not on file   Highest education level: Not on file  Occupational History   Not on file  Tobacco Use   Smoking status: Never   Smokeless tobacco: Never  Vaping Use   Vaping Use: Never used  Substance and Sexual Activity   Alcohol use: No   Drug use: No   Sexual activity: Yes    Birth control/protection: I.U.D.  Other Topics Concern   Not on file  Social History Narrative   Not on file   Social Determinants of Health   Financial Resource Strain: Not on file  Food Insecurity: Not on file  Transportation Needs: Not on file  Physical Activity: Not on file  Stress: Not on file  Social Connections: Not on file  Intimate Partner Violence: Not on file    Allergies  Allergen Reactions   Bactrim Anaphylaxis   Bee Venom Hives   Chocolate Flavor Anaphylaxis   Clindamycin Shortness Of Breath   Coconut  Oil Other (See Comments), Hives and Itching   Cymbalta [Duloxetine Hcl] Shortness Of Breath   Duloxetine Anaphylaxis   Other Hives, Other (See Comments), Rash and Itching    Insect stings Wine spirit   Peanut-Containing Drug Products Swelling, Other (See Comments), Itching, Shortness Of Breath and Hives   Potassium-Containing Compounds Anaphylaxis, Itching and Swelling   Ramipril Anaphylaxis   Sulfamethoxazole-Trimethoprim Anaphylaxis   Aspartame Other (See Comments)   Cranberry Extract Other (See Comments)   Elemental Sulfur Itching   Sulfur Itching and Other (See Comments)   Adhesive [Tape]    Amoxicillin    Ciprofloxacin    Demeclocycline    Dexlansoprazole    Doxycycline     Possibly joint pain or itch    Erythromycin     Upset stomach   Erythromycin Base    Esomeprazole    Hydrocodone Itching   Hydrocodone Bit-Homatrop Mbr     Not true allergy. "irritated"   Hydrocodone Bit-Homatrop Mbr     Hydromorphone Nausea Only   Levofloxacin     Weakness, myalgia   Levofloxacin    Lorazepam Other (See Comments)    Hallucination   Meloxicam Itching   Naproxen Itching   Naproxen Sodium Itching   Omeprazole-Sodium Bicarbonate     Chest pain-sore all over-HA   Ranitidine     Severe joint pain   Robaxin [Methocarbamol]     Made her very tired and unable to sleep at night   Tetracyclines & Related    Tizanidine     "knocked her out"- had a rebound migraine   Trazodone    Triamcinolone    Acetaminophen Rash   Etodolac Rash   Neosporin [Neomycin-Bacitracin Zn-Polymyx] Rash    Current Outpatient Medications  Medication Sig Dispense Refill   albuterol (VENTOLIN HFA) 108 (90 Base) MCG/ACT inhaler Inhale 2 puffs into the lungs every 4 (four) hours as needed for wheezing or shortness of breath. 18 g 1   ALPRAZolam (XANAX) 1 MG tablet Take 1 tablet by mouth twice daily as needed 20 tablet 1   Aspirin (VAZALORE) 81 MG CAPS Take by mouth.     atenolol (TENORMIN) 25 MG tablet Take 0.5 tablets (12.5 mg total) by mouth daily as needed. Taking 1/2 tab daily 45 tablet 3   azelastine (ASTELIN) 0.1 % nasal spray Place 1 spray into both nostrils 2 (two) times daily as needed for rhinitis. 30 mL 5   budesonide (PULMICORT) 180 MCG/ACT inhaler Inhale 2 puffs into the lungs in the morning and at bedtime. 1 each 5   cholecalciferol (VITAMIN D) 1000 UNITS tablet Take by mouth Nightly.     diazepam (VALIUM) 10 MG tablet TAKE 1 TABLET BY MOUTH AT BEDTIME AS NEEDED FOR ANXIETY 30 tablet 1   diphenhydrAMINE (BENADRYL) 25 MG tablet Take 1 tablet (25 mg total) by mouth every 6 (six) hours as needed. 30 tablet 1   EPINEPHrine (PRIMATENE MIST) 0.125 MG/ACT AERO Inhale into the lungs.     EPIPEN 2-PAK 0.3 MG/0.3ML SOAJ injection INJECT INTO THE MUSCLE ONCE FOR ALLERGICREACTION 2 each 1   estradiol (ESTRACE) 0.1 MG/GM vaginal cream Place vaginally.     levalbuterol (XOPENEX HFA) 45 MCG/ACT inhaler Inhale 2 puffs  into the lungs every 4 (four) hours as needed for wheezing. 15 g 1   MAGNESIUM PO Take by mouth as needed.     methylphenidate (RITALIN) 10 MG tablet Take 1 tablet (10 mg total) by mouth 3 (three) times daily with meals. 90 tablet  0   Pamabrom 50 MG CAPS Take 1 capsule by mouth daily as needed.     rosuvastatin (CRESTOR) 10 MG tablet Take 10 mg by mouth.     SUMAtriptan (IMITREX) 50 MG tablet Take 1 tablet (50 mg total) by mouth every 2 (two) hours as needed for migraine. 10 tablet 0   triamcinolone (NASACORT) 55 MCG/ACT AERO nasal inhaler spray 1 spray each nostril 1-2 times per day 16.5 g 5   Current Facility-Administered Medications  Medication Dose Route Frequency Provider Last Rate Last Admin   tezepelumab-ekko (TEZSPIRE) 210 MG/1.91ML syringe 210 mg  210 mg Subcutaneous Q28 days Kennith Gain, MD   210 mg at 08/13/21 1601    REVIEW OF SYSTEMS:  [X]  denotes positive finding, [ ]  denotes negative finding Cardiac  Comments:  Chest pain or chest pressure:    Shortness of breath upon exertion:    Short of breath when lying flat:    Irregular heart rhythm:        Vascular    Pain in calf, thigh, or hip brought on by ambulation:    Pain in feet at night that wakes you up from your sleep:     Blood clot in your veins:    Leg swelling:  x       Pulmonary    Oxygen at home:    Productive cough:     Wheezing:         Neurologic    Sudden weakness in arms or legs:     Sudden numbness in arms or legs:     Sudden onset of difficulty speaking or slurred speech:    Temporary loss of vision in one eye:     Problems with dizziness:         Gastrointestinal    Blood in stool:     Vomited blood:         Genitourinary    Burning when urinating:     Blood in urine:        Psychiatric    Major depression:         Hematologic    Bleeding problems:    Problems with blood clotting too easily:        Skin    Rashes or ulcers:        Constitutional    Fever or chills:       PHYSICAL EXAM: Vitals:   10/19/21 1149  BP: 118/80  Pulse: 65  Resp: 14  Temp: 97.9 F (36.6 C)  TempSrc: Temporal  SpO2: 96%  Weight: 167 lb (75.8 kg)  Height: 5\' 3"  (1.6 m)    GENERAL: The patient is a well-nourished female, in no acute distress. The vital signs are documented above. CARDIAC: There is a regular rate and rhythm.  VASCULAR:  Palpable femoral pulses bilaterally Palpable DP PT pulses bilaterally PULMONARY: No respiratory distress. ABDOMEN: Soft and non-tender. MUSCULOSKELETAL: There are no major deformities or cyanosis. NEUROLOGIC: No focal weakness or paresthesias are detected. SKIN: Discolored segment to left shin at site of previous injury, healed, epithelialized skin  PSYCHIATRIC: The patient has a normal affect.  DATA:   ABIs today are 1.09 on the right triphasic with a toe pressure of 112 and 1.13 on the left triphasic with a toe pressure of 114 with no evidence of lower extremity arterial disease.  Assessment/Plan:  51 year old female presents for evaluation of peripheral arterial disease given a slow to heal wound on the left shin as well  as paresthesias in her feet.  The left shin has some discoloration but otherwise is completely healed and epithelialized.  She has palpable dorsalis pedis and posterior tibial pulses bilaterally with normal ABIs greater than 1 and triphasic waveform at the ankle.  Discussed no signs of arterial disease based on exam and noninvasive imaging.  She does also complain of some leg swelling we will get her in some stockings and discuss exercise, elevation, compression for conservative management.  Discussed call with worsening symptoms.   Marty Heck, MD [facility] Office: [phone]

## 2021-10-20 ENCOUNTER — Other Ambulatory Visit: Payer: Self-pay

## 2021-10-21 ENCOUNTER — Ambulatory Visit: Payer: No Typology Code available for payment source | Admitting: Family Medicine

## 2021-10-21 ENCOUNTER — Encounter: Payer: Self-pay | Admitting: Family Medicine

## 2021-10-21 DIAGNOSIS — G43909 Migraine, unspecified, not intractable, without status migrainosus: Secondary | ICD-10-CM | POA: Diagnosis not present

## 2021-10-21 DIAGNOSIS — F5101 Primary insomnia: Secondary | ICD-10-CM | POA: Diagnosis not present

## 2021-10-21 DIAGNOSIS — J0100 Acute maxillary sinusitis, unspecified: Secondary | ICD-10-CM

## 2021-10-21 DIAGNOSIS — F908 Attention-deficit hyperactivity disorder, other type: Secondary | ICD-10-CM

## 2021-10-21 DIAGNOSIS — F4001 Agoraphobia with panic disorder: Secondary | ICD-10-CM

## 2021-10-21 MED ORDER — SUMATRIPTAN SUCCINATE 50 MG PO TABS: 50.0000 mg | ORAL_TABLET | ORAL | 0 refills | Status: DC | PRN

## 2021-10-21 MED ORDER — AZITHROMYCIN 250 MG PO TABS: ORAL_TABLET | ORAL | 0 refills | Status: AC

## 2021-10-21 MED ORDER — METHYLPHENIDATE HCL 10 MG PO TABS: 10.0000 mg | ORAL_TABLET | Freq: Three times a day (TID) | ORAL | 0 refills | Status: DC

## 2021-10-21 MED ORDER — ALPRAZOLAM 1 MG PO TABS: 1.0000 mg | ORAL_TABLET | Freq: Two times a day (BID) | ORAL | 1 refills | Status: DC | PRN

## 2021-10-21 MED ORDER — DIAZEPAM 10 MG PO TABS: ORAL_TABLET | ORAL | 1 refills | Status: DC

## 2021-10-21 NOTE — Progress Notes (Signed)
[facility] [address] [place] [address] Dept: [phone] Dept Fax: [phone]  Chronic Care Office Visit  Subjective:    Patient ID: Samantha [name], female    DOB: Jul 15, 1971, 51 y.o..   MRN: [id number]  Chief Complaint  Patient presents with   Follow-up    2 month f/u,  c/o congestion, sinus pressure, HA x 1 month.      History of Present Illness:  Patient is in today for reassessment of chronic medical issues.  Samantha [name] notes that in the past several weeks, she has developed a worsening of her usual allergy symptoms. she is having increased rhinorrhea and nasal congestion. This has led to development of sinus pressure with pain radiating into the upper maxillary teeth and with increased pain when bending over. She feels she has been running some low grade fever as well. Since her last appointment, she did see Dr. Neldon Mc for follow-up of her asthma and allergic issues. She remains on Qvar and AirDuo, with albuterol for quick relief. She had been on Tezspire (tezepelumab) every 4 weeks, but this was stopped due to side effects. For her nasal allergy component, she uses azelastine, Nasacort, and Auvi-Q sprays.  Samantha [name] has a history of hypertension and is managed on atenolol.   Samantha [name] has a history of migraine headaches. She uses sumatriptan for this (limited her to 10 doses a month). She requests a refill today.   Samantha [name] has multiple psychiatric issues including anxiety with depression, PTSD, panic disorder, and ADHD. She is managed on Valium for baseline control of her anxiety and management of insomnia, and Xanax for breakthrough panic issues or acute anxiety. She is on methylphenidate TID for management of her ADHD and daytime hypersomnia. She notes that she tried to establish with a psychiatrist (Dr. Merian Capron), but that when the practice learned of who her previousl psychiatrist was (Dr.  Montel Culver), they refused to see her.   Past Medical History: Patient Active Problem List   Diagnosis Date Noted   Leg swelling 10/19/2021   Interstitial cystitis 09/15/2021   Other urethral stricture, female 09/15/2021   Chronic kidney disease, stage 3a ([place]) 08/20/2021   Prediabetes 08/27/2020   History of [place] spotted fever 02/04/2020   Moderate persistent asthma without complication 74/94/4967   LPRD (laryngopharyngeal reflux disease) 08/02/2019   Chronic rhinitis 08/02/2019   Chronic post-traumatic stress disorder (PTSD) 11/21/2018   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) 11/21/2018   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/2018   Pulmonary nodules 11/13/2018   Other microscopic hematuria 11/06/2018   Anxiety and depression 07/04/2018   Fecal incontinence 07/02/2018   Leg weakness 06/12/2017   Ataxic gait 02/07/2017   Left ear hearing loss 02/07/2017   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   Mixed hyperlipidemia 11/08/2016   Acute idiopathic gout of multiple sites 11/08/2016   Elbow pain, chronic, right 10/06/2016   Lateral epicondylitis, right elbow 10/06/2016   Fibromyalgia 10/04/2016   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   Migraine headache 07/29/2016   Irritable bowel syndrome with both constipation and diarrhea 07/29/2016   Hives 07/18/2016   Urinary incontinence 06/02/2016   Atherosclerosis of abdominal aorta ([place]) 09/03/2015   Carpal tunnel syndrome 11/19/2014   Primary hypersomnia 11/18/2014   Insomnia 11/18/2014   Lumbar radicular pain 11/18/2014   Chronic pain syndrome 11/18/2014   Cervical disc disorder with radiculopathy 11/18/2014   Chronic female pelvic pain 07/31/2014   Degeneration  of intervertebral disc of lumbosacral region 07/31/2014   Rosacea 07/31/2014   Morton's neuroma 07/31/2014   Benign paroxysmal positional vertigo 07/31/2014   Essential hypertension 07/31/2014   History of  methicillin resistant Staphylococcus aureus infection 07/09/2014   Past Surgical History:  Procedure Laterality Date   BLADDER SURGERY     age 44- 27   CERVICAL FUSION     08/1998, 03/10/2017   CESAREAN SECTION     COLONOSCOPY     HAND SURGERY     2012, 2013   LAPAROSCOPIC ENDOMETRIOSIS FULGURATION Right    LESION REMOVAL  08/23/2012   Procedure: LESION REMOVAL HAND;  Surgeon: Cammie Sickle., MD;  Location: [facility];  Service: Orthopedics;  Laterality: Left;   ROTATOR CUFF REPAIR     UPPER GASTROINTESTINAL ENDOSCOPY     Family History  Problem Relation Age of Onset   ADD / ADHD Brother    Stomach cancer Maternal Grandfather    Colon cancer Neg Hx    Esophageal cancer Neg Hx    Rectal cancer Neg Hx    Outpatient Medications Prior to Visit  Medication Sig Dispense Refill   albuterol (VENTOLIN HFA) 108 (90 Base) MCG/ACT inhaler Inhale 2 puffs into the lungs every 4 (four) hours as needed for wheezing or shortness of breath. 18 g 1   Aspirin (VAZALORE) 81 MG CAPS Take by mouth.     atenolol (TENORMIN) 25 MG tablet Take 0.5 tablets (12.5 mg total) by mouth daily as needed. Taking 1/2 tab daily 45 tablet 3   azelastine (ASTELIN) 0.1 % nasal spray Place 1 spray into both nostrils 2 (two) times daily as needed for rhinitis. 30 mL 5   budesonide (PULMICORT) 180 MCG/ACT inhaler Inhale 2 puffs into the lungs in the morning and at bedtime. 1 each 5   cholecalciferol (VITAMIN D) 1000 UNITS tablet Take by mouth Nightly.     diphenhydrAMINE (BENADRYL) 25 MG tablet Take 1 tablet (25 mg total) by mouth every 6 (six) hours as needed. 30 tablet 1   EPINEPHrine (PRIMATENE MIST) 0.125 MG/ACT AERO Inhale into the lungs.     EPIPEN 2-PAK 0.3 MG/0.3ML SOAJ injection INJECT INTO THE MUSCLE ONCE FOR ALLERGICREACTION 2 each 1   estradiol (ESTRACE) 0.1 MG/GM vaginal cream Place vaginally.     levalbuterol (XOPENEX HFA) 45 MCG/ACT inhaler Inhale 2 puffs into the lungs every 4 (four)  hours as needed for wheezing. 15 g 1   MAGNESIUM PO Take by mouth as needed.     Pamabrom 50 MG CAPS Take 1 capsule by mouth daily as needed.     rosuvastatin (CRESTOR) 10 MG tablet Take 10 mg by mouth.     triamcinolone (NASACORT) 55 MCG/ACT AERO nasal inhaler spray 1 spray each nostril 1-2 times per day 16.5 g 5   ALPRAZolam (XANAX) 1 MG tablet Take 1 tablet by mouth twice daily as needed 20 tablet 1   diazepam (VALIUM) 10 MG tablet TAKE 1 TABLET BY MOUTH AT BEDTIME AS NEEDED FOR ANXIETY 30 tablet 1   methylphenidate (RITALIN) 10 MG tablet Take 1 tablet (10 mg total) by mouth 3 (three) times daily with meals. 90 tablet 0   SUMAtriptan (IMITREX) 50 MG tablet Take 1 tablet (50 mg total) by mouth every 2 (two) hours as needed for migraine. 10 tablet 0   Facility-Administered Medications Prior to Visit  Medication Dose Route Frequency Provider Last Rate Last Admin   tezepelumab-ekko (TEZSPIRE) 210 MG/1.91ML syringe 210 mg  210 mg Subcutaneous Q28 days Kennith Gain, MD   210 mg at 08/13/21 9509   Allergies  Allergen Reactions   Bactrim Anaphylaxis   Bee Venom Hives   Chocolate Flavor Anaphylaxis   Clindamycin Shortness Of Breath   Coconut Oil Other (See Comments), Hives and Itching   Cymbalta [Duloxetine Hcl] Shortness Of Breath   Duloxetine Anaphylaxis   Other Hives, Other (See Comments), Rash and Itching    Insect stings Wine spirit   Peanut-Containing Drug Products Swelling, Other (See Comments), Itching, Shortness Of Breath and Hives   Potassium-Containing Compounds Anaphylaxis, Itching and Swelling   Ramipril Anaphylaxis   Sulfamethoxazole-Trimethoprim Anaphylaxis   Aspartame Other (See Comments)   Cranberry Extract Other (See Comments)   Elemental Sulfur Itching   Sulfur Itching and Other (See Comments)   Adhesive [Tape]    Amoxicillin    Ciprofloxacin    Demeclocycline    Dexlansoprazole    Doxycycline     Possibly joint pain or itch    Erythromycin      Upset stomach   Erythromycin Base    Esomeprazole    Hydrocodone Itching   Hydrocodone Bit-Homatrop Mbr     Not true allergy. "irritated"   Hydrocodone Bit-Homatrop Mbr    Hydromorphone Nausea Only   Levofloxacin     Weakness, myalgia   Levofloxacin    Lorazepam Other (See Comments)    Hallucination   Meloxicam Itching   Naproxen Itching   Naproxen Sodium Itching   Omeprazole-Sodium Bicarbonate     Chest pain-sore all over-HA   Ranitidine     Severe joint pain   Robaxin [Methocarbamol]     Made her very tired and unable to sleep at night   Tetracyclines & Related    Tizanidine     "knocked her out"- had a rebound migraine   Trazodone    Triamcinolone    Acetaminophen Rash   Etodolac Rash   Neosporin [Neomycin-Bacitracin Zn-Polymyx] Rash     Objective:   There were no vitals filed for this visit. There is no height or weight on file to calculate BMI.   General: Well developed, well nourished. No acute distress. HEENT: Normocephalic, non-traumatic. Nose mildly congested with reddened mucosa without significant rhinorrhea.. Mild tenderness on percussion over sinuses.  Mucous membranes moist. Mild mucous streaking of posterior oropharynx. Good dentition. Neck: Supple. No lymphadenopathy. No thyromegaly. Lungs: Clear to auscultation bilaterally. No wheezing, rales or rhonchi. Psych: Alert and oriented. Normal mood and affect.  Health Maintenance Due  Topic Date Due   HIV Screening  Never done   Hepatitis C Screening  Never done   Zoster Vaccines- Shingrix (1 of 2) Never done   PAP SMEAR-Modifier  Never done   MAMMOGRAM  Never done   COVID-19 Vaccine (6 - Booster for Pfizer series) 08/11/2021     Assessment & Plan:   1. 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) Stable on current management with Ritalin.  - methylphenidate (RITALIN) 10 MG tablet; Take 1 tablet (10 mg total) by mouth 3 (three) times daily with meals.  Dispense: 90 tablet;  Refill: 0  2. Panic disorder with agoraphobia Managed on a combinaiton of BZDs by prior physicians. Apparently stable. patient having difficulty in being seen by a new psychiatrist. We will follow for now.  - diazepam (VALIUM) 10 MG tablet; TAKE 1 TABLET BY MOUTH AT BEDTIME AS NEEDED FOR ANXIETY  Dispense: 30 tablet; Refill: 1 - ALPRAZolam (XANAX) 1 MG tablet; Take 1  tablet (1 mg total) by mouth 2 (two) times daily as needed.  Dispense: 20 tablet; Refill: 1  3. Primary insomnia Stable on Valium.  - diazepam (VALIUM) 10 MG tablet; TAKE 1 TABLET BY MOUTH AT BEDTIME AS NEEDED FOR ANXIETY  Dispense: 30 tablet; Refill: 1  4. Migraine without status migrainosus, not intractable, unspecified migraine type Apparently responds well to Imitrex. I will renew this today.  - SUMAtriptan (IMITREX) 50 MG tablet; Take 1 tablet (50 mg total) by mouth every 2 (two) hours as needed for migraine.  Dispense: 10 tablet; Refill: 0  5. Acute non-recurrent maxillary sinusitis Samantha [name] has some possible findings for sinusitis. With her history of significant allergic issues, she likely is having a flare which brought this on. I will prescribe a course of azithromycin, as antibiotic options are severally limited by her stated medication allergies.  - azithromycin (ZITHROMAX) 250 MG tablet; Take 2 tablets on day 1, then 1 tablet daily on days 2 through 5  Dispense: 6 tablet; Refill: 0  Haydee Salter, MD

## 2021-11-22 ENCOUNTER — Other Ambulatory Visit: Payer: Self-pay | Admitting: Family Medicine

## 2021-11-22 DIAGNOSIS — F908 Attention-deficit hyperactivity disorder, other type: Secondary | ICD-10-CM

## 2021-11-22 DIAGNOSIS — G43909 Migraine, unspecified, not intractable, without status migrainosus: Secondary | ICD-10-CM

## 2021-11-22 MED ORDER — SUMATRIPTAN SUCCINATE 50 MG PO TABS: 50.0000 mg | ORAL_TABLET | ORAL | 3 refills | Status: DC | PRN

## 2021-11-22 MED ORDER — METHYLPHENIDATE HCL 10 MG PO TABS: 10.0000 mg | ORAL_TABLET | Freq: Three times a day (TID) | ORAL | 0 refills | Status: DC

## 2021-11-30 ENCOUNTER — Telehealth (HOSPITAL_COMMUNITY): Payer: Self-pay | Admitting: Psychiatry

## 2021-11-30 NOTE — Telephone Encounter (Incomplete)
Pt initially called about her son who sees Dr. Melanee Left. She then asked why was she blackballed. I told her I did not understand what she was referring to and asked if she could explained. ? ?The pt was upset that she cannot see Dr. De Nurse. She said she tried to becoming his pt once in the past and was late to the appt. He would not see her due to her being late. The pt called him rude and said he has an attitude problem. ? ?The pt also stated that she use to see Dr. Montel Culver and that she called everyone on the list she was given but no one would accept her as a patient. ? ? ?The pt also said that " someone needs to write her a note as to why she cannot see Dr. De Nurse" due to her doctor not believing her when she said she cannot become a pt here. ? ?She stated that she does not want to go anywhere else because "everywhere is too far and she is not driving an hour"  ?I confirmed her address and no location that I offered her over the phone was "an hour" away. I mentioned some locations in [place], [place] and [place]. ? ? ?I also sent an email to Dr. De Nurse to see if he was comfortable seeing this patient. ? ? ?I emailed her a list of resources :  ?  ?[facility] [phone]  ?[facility] [phone]   ?[facility] [phone]  ?[employer] [phone]   ?[place] [phone]  ?[employer] [phone]  ?  ?[facility] [phone] ?  ?[facility], [place], [place]  ?She takes commercial and Medicaid and see's adults and children  ?[phone]  ?   ?[facility] [place], [place], [facility]  ?She see's adults only and commercial insurance only  ?[phone]  ? ?  ?   ?Darryl Nestle PsyD, LP, CST  ?[facility],  [place], [place] [address]  ?Phone # [phone]  Fax [phone]  ?  ?  ?[facility] (couples-sex, sexuality, anxiety/ depression) (2  locations)  ?[facility] [address], [place]; [place], [place] [address]  ?Phone # [phone]  ?  ?[facility] office [place], [address]; [place], [place] [address]  ?Phone # [phone]  ?  ?  ?[facility]  ?Address: [address], [place], [place] [address]  ?Phone: [phone]  ?  ?  ?  ?[facility]   ?Licensed [place], EdD, LPC-S, LCAS, CCS, CCSOTS  ?Dr. Rolley Sims  ?  ?Specializes in Post-Traumatic Stress, Depression, Trauma Reactive Behaviors, Attachment Disorders, Sexual Abuse  ,Substance Abuse/Addiction Disorder, Complex and Secondary Trauma.   ?  ?[address]  ?[place], [place] [address]  ?[phone]  ?  ?  ?   ?[facility] [place], (mindfulness, solution focused therapy, cognitive behavioral therapy, acceptance and commitment therapy, eye movement desensitization and reprocessing, emotional freedom technique, dialectical behavior therapy and energy psychology) (anxiety, depression, PTSD, loss, self-esteem, purpose or meaning--making, decision-making, communication issues, and alcohol and drug use.   ?[address] [place], [place] [address]  ?Phone # [phone]  Fax# [phone]  ?  ?Sharee Holster, MD  ?[address];  [place], [place] [address]  ?Phone # [phone]   ?  ?Life Management consultant and psychotherapy  ?[facility]: (Self-esteem, grief/loss, anxiety / depression, career/  work issues, Database administrator, attention deficit, addictive behavior, eating disorders, sexual, physical and emotional abuse, trauma, parent/child relationships, sexual issues, marital and divorce issues, gay and lesbian issues, aging issues, multicultural issues)  ?[address] [place], [place] [address]    ?Phone # [phone]   Fax # [phone]  ?  ?  ?[facility] (relationship, Depression/grief or loss, anxiety/stress, trauma / PTSD,  Children Flossie Dibble, ADHD, Autism, behavioral problems, substance use, perinatal during pregnancy)  ?[address] [place], [place]   ?Phone # [phone] Fax # [phone]  ?  ?  ?[facility]:   ?Adult and Adolescent Psychological Treatment  ?[address]  [place], [place] [address]  ?Phone # [phone]  ?  ?Anxiety & Depression(How to cope effectively)  ?Relationship issues ??  ?Anorexia, Bulimia,Binge Eating Disorder  ?Bariatric patients (Both pre and post surgery treatment)  ?  ?  ?  ?[facility] (beginning at age 68):  ?[address] [place];  [place], [place] [address]  ?Phone # [phone]  Fax # [phone]  ?  ?  ?[facility]:  ?[address], [place]; [place], [place] [address]  ?Phone # [phone]  Fax # [phone]  ?  ?  ?  ?[facility]:  ?(ADHD treatment/testing, anxiety /depression, trauma/PTSD, psychotherapy, couples/family therapy, stress management, eating disorders, sleep disorders, cpt, women issues.)   ?[address]; [place], [place] [address]   ?Phone # [phone]  Fax # [phone]  ?  ?[facility]  ?(Children, adults, couples and families){therapeutic services, IQ, achievement, disability; autism spectrum evaluation, emotionally and behaviorally disruptive treatment, PTSD/ Trauma, Health and wellness, conflict and mediation services, ADHD}  ?[address], [place]; [place], [place] [address]  ?Phone # [phone]  fax # [phone]  ?  ?[facility]:  (2 locations)  ?(Psychological/Academic Evaluation, Individual Counseling, Marital Counseling,  ?Family Counseling, Crisis Intervention and Conflict Resolution, Stress Reduction and Management, Career Counseling, Parenting Issues, Psycho-Educational Groups  ?Business Consultation, Press photographer, Critical Incident and Trauma Response to [employer] and Schools  ?  ?[facility]  office [address] [place]; [place], [place] [address]  ?Phone # [phone]   Fax # [phone]  ?  ?[facility] [address], [place];  [place], [place] [address]  ?Phone # [phone]  Fax # [phone]  ?  ?  ?[employer] [facility], [place]  ?[address] ; [place], [place] [address]  ?Phone # [phone]   Fax # [phone]  ?  ?Corena Pilgrim, MD ([facility]) {ADHD/ testing, anxiety, autistic, schizophrenia, PTSD, depression, bipolar} (family, couples/marital therapy, individual and medication management, [place])   ?[facility], [place] [employer]; [place], [place] [address]  ?Phone # [phone] Fax # [phone]  ?  ?[employer] [facility] [place] (child/ adults) {individual/ couple/family therapy, OCD, ADHD, Learning disorder and neuropsychological evaluation}  ?[address]; [place], [place] [address]  ?Phone # [phone]  ?  ?  ?[facility]:  ?[address], [place]; [place], [place] [address]  ?Phone # [phone]     Fax # [phone]  ?  ?  ?[facility] ?[address], [place],  [address]  ?Phone # [phone]  Fax # [phone]  ?  ?   ?[facility]  ?[address] [place],  [address]  ?Phone # [phone]  ?  ?[facility]  ?[address] [place],  [address]  ?Phone # [phone]  ?  ?  [facility]  ?[address], [place];  [place], [place] [address]  ?Phone # [phone]  ?  ?[employer]  ?[address] [place], [place] [address]  ?Phone # [phone]  ?  ?[facility]  ?[address] [place], [place]  [address], [place], [place] [address]  ?Phone # [phone] / [phone]  Fax: [phone]   ?  ?[facility]  ?[address] [place], [place] [address]  ?Phone # [phone]  ?  ?[facility]  ?[address], [place]; [place], [place] [address]  ?Phone # [phone]  Fax # [phone]  ?   ?[facility]  ?[address], [place], [place] [address]  ?Phone # [phone]  Fax # [phone]  ?   ?  ?For [place] area:  ?  ?[facility] [address]  ?[place], [place] [address]

## 2021-12-07 ENCOUNTER — Ambulatory Visit: Payer: No Typology Code available for payment source | Admitting: Allergy and Immunology

## 2021-12-07 ENCOUNTER — Other Ambulatory Visit: Payer: Self-pay

## 2021-12-07 VITALS — BP 120/74 | HR 74 | Temp 98.0°F | Resp 16 | Ht 63.0 in | Wt 167.4 lb

## 2021-12-07 DIAGNOSIS — K219 Gastro-esophageal reflux disease without esophagitis: Secondary | ICD-10-CM | POA: Diagnosis not present

## 2021-12-07 DIAGNOSIS — J4550 Severe persistent asthma, uncomplicated: Secondary | ICD-10-CM

## 2021-12-07 DIAGNOSIS — L500 Allergic urticaria: Secondary | ICD-10-CM

## 2021-12-07 DIAGNOSIS — J383 Other diseases of vocal cords: Secondary | ICD-10-CM

## 2021-12-07 DIAGNOSIS — R232 Flushing: Secondary | ICD-10-CM

## 2021-12-07 DIAGNOSIS — H6981 Other specified disorders of Eustachian tube, right ear: Secondary | ICD-10-CM

## 2021-12-07 MED ORDER — LEVALBUTEROL TARTRATE 45 MCG/ACT IN AERO: 2.0000 | INHALATION_SPRAY | RESPIRATORY_TRACT | 1 refills | Status: DC | PRN

## 2021-12-07 MED ORDER — BUDESONIDE 180 MCG/ACT IN AEPB: 2.0000 | INHALATION_SPRAY | Freq: Two times a day (BID) | RESPIRATORY_TRACT | 5 refills | Status: DC

## 2021-12-07 NOTE — Patient Instructions (Addendum)
?  1. Continue treatment of asthma:  ? ?A. Pulmicort 160 - 2 inhalations 2 times per day ? ?2. Continue treatment of allergic rhinitis: ? ? A. OTC Nasacort - 1 spray each nostril 1-2 times per day ? B. Azelastine nasal spray - 1 spray each nostril 1-2 times per day ? ?3. Treat reflux: ? ? A. Gaviscon ? ?3. If needed: ? ?A. Antihistamine ?B. Auvi-Q 0.3 / Epi-Pen ?C. Levalbuterol   ?D. Positive pressure inflation of ears ? ?4. Return to clinic in late Summer 2023 or earlier if problem ? ?  ? ?  ?  ?  ? ? ?  ? ?   ?

## 2021-12-07 NOTE — Progress Notes (Signed)
? ?[facility] ? ? ?Follow-up Note ? ?Referring Provider: Haydee Salter, MD ?Primary Provider: Haydee Salter, MD ?Date of Office Visit: 12/07/2021 ? ?Subjective:  ? ?Samantha [name] (DOB: 1971-02-06) is a 51 y.o. female who returns to the [facility] on 12/07/2021 in re-evaluation of the following: ? ?HPI: Stellar returns to this clinic in reevaluation of asthma, allergic rhinitis, LPR, vocal cord dysfunction, and a history of urticaria/flushing disorder with unexplained etiology.  Her last visit to this clinic was 07 September 2021. ? ?During her last visit she was having so many side effects of every medicine that we were giving her and we were not getting good control of all of her issues and we referred her on to [facility] allergy department.  Unfortunately, there was some logistical issue and she never had that appointment completed. ? ?Overall she actually feels pretty good at this point in time.  Her asthma has been under very good control on her current plan and her requirement for short acting bronchodilator is 2-3 times per week.  She feels as though her nose is doing pretty good with some Nasacort and azelastine although she does have a little bit more problems with some nasal congestion on occasion.  This appears to correlate with pollen exposure.  In addition, she has had some right ear pain and some occasional popping. ? ?Her reflux is under pretty good control at this point in time while using some Gaviscon. ? ?She states that she has had 2 "allergic reactions" with unknown trigger presenting as coughing and wheezing and mucus filling up her nose and chest and choking and vomiting for which she used an EpiPen on both episodes with resolution within minutes. ? ?Her skin has been doing quite well and she has not had any flareups of her urticaria or flushing. ? ?Allergies as of 12/07/2021   ? ?   Reactions  ? Bactrim Anaphylaxis  ? Bee  Venom Hives  ? Chocolate Flavor Anaphylaxis  ? Clindamycin Shortness Of Breath  ? Coconut Oil Other (See Comments), Hives, Itching  ? Cymbalta [duloxetine Hcl] Shortness Of Breath  ? Duloxetine Anaphylaxis  ? Other Hives, Other (See Comments), Rash, Itching  ? Insect stings ?Wine spirit  ? Peanut-containing Drug Products Swelling, Other (See Comments), Itching, Shortness Of Breath, Hives  ? Potassium-containing Compounds Anaphylaxis, Itching, Swelling  ? Ramipril Anaphylaxis  ? Sulfamethoxazole-trimethoprim Anaphylaxis  ? Aspartame Other (See Comments)  ? Cranberry Extract Other (See Comments)  ? Elemental Sulfur Itching  ? Sulfur Itching, Other (See Comments)  ? Adhesive [tape]   ? Amoxicillin   ? Ciprofloxacin   ? Demeclocycline   ? Dexlansoprazole   ? Doxycycline   ? Possibly joint pain or itch  ? Erythromycin   ? Upset stomach  ? Erythromycin Base   ? Esomeprazole   ? Hydrocodone Itching  ? Hydrocodone Bit-homatrop Mbr   ? Not true allergy. "irritated"  ? Hydrocodone Bit-homatrop Mbr   ? Hydromorphone Nausea Only  ? Levofloxacin   ? Weakness, myalgia  ? Levofloxacin   ? Lorazepam Other (See Comments)  ? Hallucination  ? Meloxicam Itching  ? Naproxen Itching  ? Naproxen Sodium Itching  ? Omeprazole-sodium Bicarbonate   ? Chest pain-sore all over-HA  ? Ranitidine   ? Severe joint pain  ? Robaxin [methocarbamol]   ? Made her very tired and unable to sleep at night  ? Tetracyclines & Related   ?  Tizanidine   ? "knocked her out"- had a rebound migraine  ? Trazodone   ? Triamcinolone   ? Acetaminophen Rash  ? Etodolac Rash  ? Neosporin [neomycin-bacitracin Zn-polymyx] Rash  ? ?  ? ?  ?Medication List  ? ? ?albuterol 108 (90 Base) MCG/ACT inhaler ?Commonly known as: VENTOLIN HFA ?Inhale 2 puffs into the lungs every 4 (four) hours as needed for wheezing or shortness of breath. ?  ?ALPRAZolam 1 MG tablet ?Commonly known as: Duanne Moron ?Take 1 tablet (1 mg total) by mouth 2 (two) times daily as needed. ?  ?atenolol 25 MG  tablet ?Commonly known as: TENORMIN ?Take 0.5 tablets (12.5 mg total) by mouth daily as needed. Taking 1/2 tab daily ?  ?azelastine 0.1 % nasal spray ?Commonly known as: ASTELIN ?Place 1 spray into both nostrils 2 (two) times daily as needed for rhinitis. ?  ?budesonide 180 MCG/ACT inhaler ?Commonly known as: PULMICORT ?Inhale 2 puffs into the lungs in the morning and at bedtime. ?  ?cholecalciferol 1000 units tablet ?Commonly known as: VITAMIN D ?Take by mouth Nightly. ?  ?diazepam 10 MG tablet ?Commonly known as: VALIUM ?TAKE 1 TABLET BY MOUTH AT BEDTIME AS NEEDED FOR ANXIETY ?  ?diphenhydrAMINE 25 MG tablet ?Commonly known as: BENADRYL ?Take 1 tablet (25 mg total) by mouth every 6 (six) hours as needed. ?  ?EpiPen 2-Pak 0.3 mg/0.3 mL Soaj injection ?Generic drug: EPINEPHrine ?INJECT INTO THE MUSCLE ONCE FOR ALLERGICREACTION ?  ?estradiol 0.1 MG/GM vaginal cream ?Commonly known as: ESTRACE ?Place vaginally. ?  ?levalbuterol 45 MCG/ACT inhaler ?Commonly known as: XOPENEX HFA ?Inhale 2 puffs into the lungs every 4 (four) hours as needed for wheezing. ?  ?MAGNESIUM PO ?Take by mouth as needed. ?  ?methylphenidate 10 MG tablet ?Commonly known as: Ritalin ?Take 1 tablet (10 mg total) by mouth 3 (three) times daily with meals. ?  ?Pamabrom 50 MG Caps ?Take 1 capsule by mouth daily as needed. ?  ?Primatene Mist 0.125 MG/ACT Aero ?Generic drug: EPINEPHrine ?Inhale into the lungs. ?  ?rosuvastatin 10 MG tablet ?Commonly known as: CRESTOR ?Take 10 mg by mouth. ?  ?SUMAtriptan 50 MG tablet ?Commonly known as: IMITREX ?Take 1 tablet (50 mg total) by mouth every 2 (two) hours as needed for migraine. ?  ?triamcinolone 55 MCG/ACT Aero nasal inhaler ?Commonly known as: NASACORT ?spray 1 spray each nostril 1-2 times per day ?  ?Vazalore 81 MG Caps ?Generic drug: Aspirin ?Take by mouth. ?  ? ?Past Medical History:  ?Diagnosis Date  ? ADD (attention deficit disorder)   ? Allergy   ? Anaphylaxis   ? Arthritis   ? Asthma   ? Chronic  back pain   ? Chronic neck pain   ? Endometriosis   ? Fibromyalgia   ? GERD (gastroesophageal reflux disease)   ? Hernia   ? Hyperlipidemia   ? Hypertension   ? IBS (irritable bowel syndrome)   ? IC (interstitial cystitis)   ? Low blood potassium   ? Multiple allergies   ? OCD (obsessive compulsive disorder)   ? Raynaud disease   ? Vertigo   ? ? ?Past Surgical History:  ?Procedure Laterality Date  ? BLADDER SURGERY    ? age 7- 66  ? CERVICAL FUSION    ? 08/1998, 03/10/2017  ? CESAREAN SECTION    ? COLONOSCOPY    ? HAND SURGERY    ? 2012, 2013  ? LAPAROSCOPIC ENDOMETRIOSIS FULGURATION Right   ? LESION REMOVAL  08/23/2012  ?  Procedure: LESION REMOVAL HAND;  Surgeon: Cammie Sickle., MD;  Location: [facility];  Service: Orthopedics;  Laterality: Left;  ? ROTATOR CUFF REPAIR    ? UPPER GASTROINTESTINAL ENDOSCOPY    ? ? ?Review of systems negative except as noted in HPI / PMHx or noted below: ? ?Review of Systems  ?Constitutional: Negative.   ?HENT: Negative.    ?Eyes: Negative.   ?Respiratory: Negative.    ?Cardiovascular: Negative.   ?Gastrointestinal: Negative.   ?Genitourinary: Negative.   ?Musculoskeletal: Negative.   ?Skin: Negative.   ?Neurological: Negative.   ?Endo/Heme/Allergies: Negative.   ?Psychiatric/Behavioral: Negative.    ? ? ?Objective:  ? ?Vitals:  ? 12/07/21 1340  ?BP: 120/74  ?Pulse: 74  ?Resp: 16  ?Temp: 98 ?F (36.7 ?C)  ?SpO2: 97%  ? ?Height: '5\' 3"'$  (160 cm)  ?Weight: 167 lb 6 oz (75.9 kg)  ? ?Physical Exam ?Constitutional:   ?   Appearance: She is not diaphoretic.  ?HENT:  ?   Head: Normocephalic.  ?   Right Ear: Tympanic membrane, ear canal and external ear normal.  ?   Left Ear: Tympanic membrane, ear canal and external ear normal.  ?   Nose: Nose normal. No mucosal edema or rhinorrhea.  ?   Mouth/Throat:  ?   Pharynx: Uvula midline. No oropharyngeal exudate.  ?Eyes:  ?   Conjunctiva/sclera: Conjunctivae normal.  ?Neck:  ?   Thyroid: No thyromegaly.  ?   Trachea: Trachea normal.  No tracheal tenderness or tracheal deviation.  ?Cardiovascular:  ?   Rate and Rhythm: Normal rate and regular rhythm.  ?   Heart sounds: Normal heart sounds, S1 normal and S2 normal. No murmur heard. ?Pulmo

## 2021-12-08 ENCOUNTER — Encounter: Payer: Self-pay | Admitting: Allergy and Immunology

## 2021-12-20 ENCOUNTER — Ambulatory Visit: Payer: No Typology Code available for payment source | Admitting: Family Medicine

## 2021-12-20 VITALS — BP 118/80 | HR 67 | Temp 97.0°F | Wt 166.0 lb

## 2021-12-20 DIAGNOSIS — M549 Dorsalgia, unspecified: Secondary | ICD-10-CM | POA: Diagnosis not present

## 2021-12-20 DIAGNOSIS — F4001 Agoraphobia with panic disorder: Secondary | ICD-10-CM | POA: Diagnosis not present

## 2021-12-20 DIAGNOSIS — F5101 Primary insomnia: Secondary | ICD-10-CM | POA: Diagnosis not present

## 2021-12-20 DIAGNOSIS — F908 Attention-deficit hyperactivity disorder, other type: Secondary | ICD-10-CM

## 2021-12-20 DIAGNOSIS — G8929 Other chronic pain: Secondary | ICD-10-CM

## 2021-12-20 MED ORDER — ALPRAZOLAM 1 MG PO TABS: 1.0000 mg | ORAL_TABLET | Freq: Two times a day (BID) | ORAL | 1 refills | Status: DC | PRN

## 2021-12-20 MED ORDER — DIAZEPAM 10 MG PO TABS: ORAL_TABLET | ORAL | 1 refills | Status: DC

## 2021-12-20 MED ORDER — METHYLPHENIDATE HCL 10 MG PO TABS: 10.0000 mg | ORAL_TABLET | Freq: Three times a day (TID) | ORAL | 0 refills | Status: DC

## 2021-12-20 MED ORDER — BACLOFEN 5 MG PO TABS: 5.0000 mg | ORAL_TABLET | Freq: Three times a day (TID) | ORAL | 2 refills | Status: DC

## 2021-12-20 NOTE — Progress Notes (Signed)
?[facility] ?[facility] ?[place] ?[place] [address] ?Dept: [phone] ?Dept Fax: [phone] ? ?Chronic Care Office Visit ? ?Subjective:  ? ? Patient ID: Samantha [name], female    DOB: 03-16-1971, 51 y.o..   MRN: [id number] ? ?Chief Complaint  ?Patient presents with  ? Follow-up  ?  2 month f/u.  C/o having in back pian (fell 2 weeks ago in the yard), No OTC meds, using heating pad.    ? ? ?History of Present Illness: ? ?Patient is in today for reassessment of chronic medical issues. ? ?[name] notes she suffered a fall about two weeks ago. She states she "[place] kicked" a post in her yard and then fell to the ground. Since that time, she has had more back and neck discomfort. As well, she feels she has more numbness in her left arm. She has not had anything in particular to take, so has focused on physical approaches to reducing her pain. ? ?[name] notes her asthma and allergy symptoms are much more stable. She states Dr. Neldon Mc has recommended she avoid vaccinations. She is also no longer on Tezspire. ?  ?[name] has multiple psychiatric issues including anxiety with depression, PTSD, panic disorder, and ADHD. She is managed on Valium for baseline control of her anxiety and management of insomnia, and Xanax for breakthrough panic issues or acute anxiety. She is on methylphenidate TID for management of her ADHD and daytime hypersomnia. These have currently been stable. She has a son with a history of autism. She discussed some of the challenges she faces related to his care, but also how capable he can be for many situations. ? ?Past Medical History: ?Patient Active Problem List  ? Diagnosis Date Noted  ? Leg swelling 10/19/2021  ? Interstitial cystitis 09/15/2021  ? Other urethral stricture, female 09/15/2021  ? Chronic kidney disease, stage 3a ([place]) 08/20/2021  ? Prediabetes 08/27/2020  ? History of Rocky Mountain spotted fever 02/04/2020   ? Moderate persistent asthma without complication 44/31/5400  ? LPRD (laryngopharyngeal reflux disease) 08/02/2019  ? Chronic rhinitis 08/02/2019  ? Chronic post-traumatic stress disorder (PTSD) 11/21/2018  ? 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) 11/21/2018  ? 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/2018  ? Pulmonary nodules 11/13/2018  ? Other microscopic hematuria 11/06/2018  ? Anxiety and depression 07/04/2018  ? Fecal incontinence 07/02/2018  ? Leg weakness 06/12/2017  ? Ataxic gait 02/07/2017  ? Left ear hearing loss 02/07/2017  ? 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 Mixed hyperlipidemia 11/08/2016  ? Acute idiopathic gout of multiple sites 11/08/2016  ? Elbow pain, chronic, right 10/06/2016  ? Lateral epicondylitis, right elbow 10/06/2016  ? Fibromyalgia 10/04/2016  ? 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 Migraine headache 07/29/2016  ? Irritable bowel syndrome with both constipation and diarrhea 07/29/2016  ? Hives 07/18/2016  ? Urinary incontinence 06/02/2016  ? Atherosclerosis of abdominal aorta ([place]) 09/03/2015  ? Carpal tunnel syndrome 11/19/2014  ? Primary hypersomnia 11/18/2014  ? Insomnia 11/18/2014  ? Lumbar radicular pain 11/18/2014  ? Chronic pain syndrome 11/18/2014  ? Cervical disc disorder with radiculopathy 11/18/2014  ? Chronic female pelvic pain 07/31/2014  ? Degeneration of intervertebral disc of lumbosacral region 07/31/2014  ? Rosacea 07/31/2014  ? Morton's neuroma 07/31/2014  ? Benign paroxysmal positional vertigo 07/31/2014  ? Essential hypertension 07/31/2014  ? History of methicillin resistant Staphylococcus aureus infection 07/09/2014  ? ?Past Surgical History:  ?Procedure Laterality Date  ?  BLADDER SURGERY    ? age 63- 40  ? CERVICAL FUSION    ? 08/1998, 03/10/2017  ? CESAREAN SECTION    ? COLONOSCOPY    ? HAND SURGERY    ? 2012, 2013  ? LAPAROSCOPIC ENDOMETRIOSIS FULGURATION Right   ? LESION REMOVAL  08/23/2012  ? Procedure:  LESION REMOVAL HAND;  Surgeon: Cammie Sickle., MD;  Location: [facility];  Service: Orthopedics;  Laterality: Left;  ? ROTATOR CUFF REPAIR    ? UPPER GASTROINTESTINAL ENDOSCOPY    ? ?Family History  ?Problem Relation Age of Onset  ? ADD / ADHD Brother   ? Stomach cancer Maternal Grandfather   ? Colon cancer Neg Hx   ? Esophageal cancer Neg Hx   ? Rectal cancer Neg Hx   ? ?Outpatient Medications Prior to Visit  ?Medication Sig Dispense Refill  ? albuterol (VENTOLIN HFA) 108 (90 Base) MCG/ACT inhaler Inhale 2 puffs into the lungs every 4 (four) hours as needed for wheezing or shortness of breath. 18 g 1  ? Aspirin (VAZALORE) 81 MG CAPS Take by mouth.    ? atenolol (TENORMIN) 25 MG tablet Take 0.5 tablets (12.5 mg total) by mouth daily as needed. Taking 1/2 tab daily 45 tablet 3  ? azelastine (ASTELIN) 0.1 % nasal spray Place 1 spray into both nostrils 2 (two) times daily as needed for rhinitis. 30 mL 5  ? budesonide (PULMICORT) 180 MCG/ACT inhaler Inhale 2 puffs into the lungs in the morning and at bedtime. 3 each 5  ? cholecalciferol (VITAMIN D) 1000 UNITS tablet Take by mouth Nightly.    ? diphenhydrAMINE (BENADRYL) 25 MG tablet Take 1 tablet (25 mg total) by mouth every 6 (six) hours as needed. 30 tablet 1  ? EPINEPHrine (PRIMATENE MIST) 0.125 MG/ACT AERO Inhale into the lungs.    ? EPIPEN 2-PAK 0.3 MG/0.3ML SOAJ injection INJECT INTO THE MUSCLE ONCE FOR ALLERGICREACTION 2 each 1  ? estradiol (ESTRACE) 0.1 MG/GM vaginal cream Place vaginally.    ? levalbuterol (XOPENEX HFA) 45 MCG/ACT inhaler Inhale 2 puffs into the lungs every 4 (four) hours as needed for wheezing. 15 g 1  ? MAGNESIUM PO Take by mouth as needed.    ? Pamabrom 50 MG CAPS Take 1 capsule by mouth daily as needed.    ? rosuvastatin (CRESTOR) 10 MG tablet Take 10 mg by mouth.    ? SUMAtriptan (IMITREX) 50 MG tablet Take 1 tablet (50 mg total) by mouth every 2 (two) hours as needed for migraine. 10 tablet 3  ? triamcinolone  (NASACORT) 55 MCG/ACT AERO nasal inhaler spray 1 spray each nostril 1-2 times per day 16.5 g 5  ? ALPRAZolam (XANAX) 1 MG tablet Take 1 tablet (1 mg total) by mouth 2 (two) times daily as needed. 20 tablet 1  ? diazepam (VALIUM) 10 MG tablet TAKE 1 TABLET BY MOUTH AT BEDTIME AS NEEDED FOR ANXIETY 30 tablet 1  ? methylphenidate (RITALIN) 10 MG tablet Take 1 tablet (10 mg total) by mouth 3 (three) times daily with meals. 90 tablet 0  ? tezepelumab-ekko (TEZSPIRE) 210 MG/1.91ML syringe 210 mg     ? ?No facility-administered medications prior to visit.  ? ?Allergies  ?Allergen Reactions  ? Bactrim Anaphylaxis  ? Bee Venom Hives  ? Chocolate Flavor Anaphylaxis  ? Clindamycin Shortness Of Breath  ? Coconut Oil Other (See Comments), Hives and Itching  ? Cymbalta [Duloxetine Hcl] Shortness Of Breath  ? Duloxetine Anaphylaxis  ?  Other Hives, Other (See Comments), Rash and Itching  ?  Insect stings ?Wine spirit  ? Peanut-Containing Drug Products Swelling, Other (See Comments), Itching, Shortness Of Breath and Hives  ? Potassium-Containing Compounds Anaphylaxis, Itching and Swelling  ? Ramipril Anaphylaxis  ? Sulfamethoxazole-Trimethoprim Anaphylaxis  ? Aspartame Other (See Comments)  ? Cranberry Extract Other (See Comments)  ? Elemental Sulfur Itching  ? Sulfur Itching and Other (See Comments)  ? Adhesive [Tape]   ? Amoxicillin   ? Ciprofloxacin   ? Demeclocycline   ? Dexlansoprazole   ? Doxycycline   ?  Possibly joint pain or itch ?  ? Erythromycin   ?  Upset stomach  ? Erythromycin Base   ? Esomeprazole   ? Hydrocodone Itching  ? Hydrocodone Bit-Homatrop Mbr   ?  Not true allergy. "irritated"  ? Hydrocodone Bit-Homatrop Mbr   ? Hydromorphone Nausea Only  ? Levofloxacin   ?  Weakness, myalgia  ? Levofloxacin   ? Lorazepam Other (See Comments)  ?  Hallucination  ? Meloxicam Itching  ? Naproxen Itching  ? Naproxen Sodium Itching  ? Omeprazole-Sodium Bicarbonate   ?  Chest pain-sore all over-HA  ? Ranitidine   ?  Severe joint  pain  ? Robaxin [Methocarbamol]   ?  Made her very tired and unable to sleep at night  ? Tetracyclines & Related   ? Tizanidine   ?  "knocked her out"- had a rebound migraine  ? Trazodone   ? Triamcinolone   ? Ace

## 2022-01-03 ENCOUNTER — Encounter: Payer: Self-pay | Admitting: Family Medicine

## 2022-01-03 DIAGNOSIS — E782 Mixed hyperlipidemia: Secondary | ICD-10-CM

## 2022-01-04 ENCOUNTER — Encounter: Payer: Self-pay | Admitting: Family Medicine

## 2022-01-04 MED ORDER — ROSUVASTATIN CALCIUM 10 MG PO TABS: 10.0000 mg | ORAL_TABLET | Freq: Every day | ORAL | 3 refills | Status: DC

## 2022-01-25 ENCOUNTER — Other Ambulatory Visit: Payer: Self-pay | Admitting: Family Medicine

## 2022-01-25 DIAGNOSIS — F908 Attention-deficit hyperactivity disorder, other type: Secondary | ICD-10-CM

## 2022-01-25 MED ORDER — METHYLPHENIDATE HCL 10 MG PO TABS: 10.0000 mg | ORAL_TABLET | Freq: Three times a day (TID) | ORAL | 0 refills | Status: DC

## 2022-01-25 NOTE — Telephone Encounter (Signed)
Refill request for: ? ?Ritalin 10 mg   ?LR 12/20/21, #90, 0 rf ?LOV 12/20/21 ?FOV  02/25/22 ? ?Please review and advise.  ?Thanks. Dm/cma ? ?

## 2022-02-04 ENCOUNTER — Ambulatory Visit (INDEPENDENT_AMBULATORY_CARE_PROVIDER_SITE_OTHER): Payer: No Typology Code available for payment source | Admitting: Family Medicine

## 2022-02-04 VITALS — BP 110/78 | HR 72 | Temp 97.6°F | Ht 63.0 in | Wt 166.0 lb

## 2022-02-04 DIAGNOSIS — J0100 Acute maxillary sinusitis, unspecified: Secondary | ICD-10-CM | POA: Diagnosis not present

## 2022-02-04 MED ORDER — BENZONATATE 100 MG PO CAPS: 100.0000 mg | ORAL_CAPSULE | Freq: Two times a day (BID) | ORAL | 0 refills | Status: DC | PRN

## 2022-02-04 MED ORDER — AZITHROMYCIN 250 MG PO TABS: ORAL_TABLET | ORAL | 0 refills | Status: AC

## 2022-02-04 NOTE — Progress Notes (Signed)
[facility] [address] [place] [address] Dept: [phone] Dept Fax: [phone]  Office Visit  Subjective:    Patient ID: Samantha [name], female    DOB: 1971/08/29, 51 y.o..   MRN: [id number]  Chief Complaint  Patient presents with   Acute Visit    C/;o having SOB/Asthma issues, head/sinus pressure, watery eyes x 5 days.  She has take Benadryl, Guaifenesin, Afrin, [place] Eye drops.     History of Present Illness:  Patient is in today complaining of a 5-day history of increased SOB. She has an extensive history of asthma issues and marked allergies. She notes she used her Epi-pen three times this week. She reports a home O2 sat of 88% yesterday. Additionally, she has been having cough, productive of mucous, nasal congestion, and significant amount of rhinorrhea. She has been prone to sinusitis. She does report pain over the maxillary sinuses. She remains on her extensive regimen of inhalers and allergy meds.  Past Medical History: Patient Active Problem List   Diagnosis Date Noted   Leg swelling 10/19/2021   Interstitial cystitis 09/15/2021   Other urethral stricture, female 09/15/2021   Chronic kidney disease, stage 3a ([place]) 08/20/2021   Prediabetes 08/27/2020   History of Rocky Mountain spotted fever 02/04/2020   Moderate persistent asthma without complication 52/84/1324   LPRD (laryngopharyngeal reflux disease) 08/02/2019   Chronic rhinitis 08/02/2019   Chronic post-traumatic stress disorder (PTSD) 11/21/2018   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) 11/21/2018   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/2018   Pulmonary nodules 11/13/2018   Other microscopic hematuria 11/06/2018   Anxiety and depression 07/04/2018   Fecal incontinence 07/02/2018   Leg weakness 06/12/2017   Ataxic gait 02/07/2017   Left ear hearing loss 02/07/2017   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[place])  12/20/2016   Mixed hyperlipidemia 11/08/2016   Acute idiopathic gout of multiple sites 11/08/2016   Elbow pain, chronic, right 10/06/2016   Lateral epicondylitis, right elbow 10/06/2016   Fibromyalgia 10/04/2016   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   Migraine headache 07/29/2016   Irritable bowel syndrome with both constipation and diarrhea 07/29/2016   Hives 07/18/2016   Urinary incontinence 06/02/2016   Atherosclerosis of abdominal aorta ([place]) 09/03/2015   Carpal tunnel syndrome 11/19/2014   Primary hypersomnia 11/18/2014   Insomnia 11/18/2014   Lumbar radicular pain 11/18/2014   Chronic pain syndrome 11/18/2014   Cervical disc disorder with radiculopathy 11/18/2014   Chronic female pelvic pain 07/31/2014   Degeneration of intervertebral disc of lumbosacral region 07/31/2014   Rosacea 07/31/2014   Morton's neuroma 07/31/2014   Benign paroxysmal positional vertigo 07/31/2014   Essential hypertension 07/31/2014   History of methicillin resistant Staphylococcus aureus infection 07/09/2014   Past Surgical History:  Procedure Laterality Date   BLADDER SURGERY     age 28- 30   CERVICAL FUSION     08/1998, 03/10/2017   CESAREAN SECTION     COLONOSCOPY     HAND SURGERY     2012, 2013   LAPAROSCOPIC ENDOMETRIOSIS FULGURATION Right    LESION REMOVAL  08/23/2012   Procedure: LESION REMOVAL HAND;  Surgeon: Cammie Sickle., MD;  Location: [facility];  Service: Orthopedics;  Laterality: Left;   ROTATOR CUFF REPAIR     UPPER GASTROINTESTINAL ENDOSCOPY     Family History  Problem Relation Age of Onset   ADD / ADHD Brother    Stomach cancer Maternal Grandfather  Colon cancer Neg Hx    Esophageal cancer Neg Hx    Rectal cancer Neg Hx    Outpatient Medications Prior to Visit  Medication Sig Dispense Refill   albuterol (VENTOLIN HFA) 108 (90 Base) MCG/ACT inhaler Inhale 2 puffs into the lungs every 4 (four) hours as needed for wheezing or shortness of  breath. 18 g 1   ALPRAZolam (XANAX) 1 MG tablet Take 1 tablet (1 mg total) by mouth 2 (two) times daily as needed. 20 tablet 1   Aspirin (VAZALORE) 81 MG CAPS Take by mouth.     atenolol (TENORMIN) 25 MG tablet Take 0.5 tablets (12.5 mg total) by mouth daily as needed. Taking 1/2 tab daily 45 tablet 3   azelastine (ASTELIN) 0.1 % nasal spray Place 1 spray into both nostrils 2 (two) times daily as needed for rhinitis. 30 mL 5   budesonide (PULMICORT) 180 MCG/ACT inhaler Inhale 2 puffs into the lungs in the morning and at bedtime. 3 each 5   cholecalciferol (VITAMIN D) 1000 UNITS tablet Take by mouth Nightly.     diazepam (VALIUM) 10 MG tablet TAKE 1 TABLET BY MOUTH AT BEDTIME AS NEEDED FOR ANXIETY 30 tablet 1   diphenhydrAMINE (BENADRYL) 25 MG tablet Take 1 tablet (25 mg total) by mouth every 6 (six) hours as needed. 30 tablet 1   EPINEPHrine (PRIMATENE MIST) 0.125 MG/ACT AERO Inhale into the lungs.     EPIPEN 2-PAK 0.3 MG/0.3ML SOAJ injection INJECT INTO THE MUSCLE ONCE FOR ALLERGICREACTION 2 each 1   estradiol (ESTRACE) 0.1 MG/GM vaginal cream Place vaginally.     levalbuterol (XOPENEX HFA) 45 MCG/ACT inhaler Inhale 2 puffs into the lungs every 4 (four) hours as needed for wheezing. 15 g 1   MAGNESIUM PO Take by mouth as needed.     methylphenidate (RITALIN) 10 MG tablet Take 1 tablet (10 mg total) by mouth 3 (three) times daily with meals. 90 tablet 0   Pamabrom 50 MG CAPS Take 1 capsule by mouth daily as needed.     rosuvastatin (CRESTOR) 10 MG tablet Take 1 tablet (10 mg total) by mouth daily. 90 tablet 3   SUMAtriptan (IMITREX) 50 MG tablet Take 1 tablet (50 mg total) by mouth every 2 (two) hours as needed for migraine. 10 tablet 3   triamcinolone (NASACORT) 55 MCG/ACT AERO nasal inhaler spray 1 spray each nostril 1-2 times per day 16.5 g 5   Baclofen 5 MG TABS Take 5 mg by mouth in the morning, at noon, and at bedtime. 30 tablet 2   No facility-administered medications prior to visit.    Allergies  Allergen Reactions   Bactrim Anaphylaxis   Bee Venom Hives   Chocolate Flavor Anaphylaxis   Clindamycin Shortness Of Breath   Coconut (Cocos Nucifera) Other (See Comments), Hives and Itching   Cymbalta [Duloxetine Hcl] Shortness Of Breath   Duloxetine Anaphylaxis   Other Hives, Other (See Comments), Rash and Itching    Insect stings Wine spirit   Peanut-Containing Drug Products Swelling, Other (See Comments), Itching, Shortness Of Breath and Hives   Potassium-Containing Compounds Anaphylaxis, Itching and Swelling   Ramipril Anaphylaxis   Sulfamethoxazole-Trimethoprim Anaphylaxis   Aspartame Other (See Comments)   Cranberry Extract Other (See Comments)   Elemental Sulfur Itching   Sulfur Itching and Other (See Comments)   Adhesive [Tape]    Amoxicillin    Ciprofloxacin    Demeclocycline    Dexlansoprazole    Doxycycline     Possibly joint  pain or itch    Erythromycin     Upset stomach   Erythromycin Base    Esomeprazole    Hydrocodone Itching   Hydrocodone Bit-Homatrop Mbr     Not true allergy. "irritated"   Hydrocodone Bit-Homatrop Mbr    Hydromorphone Nausea Only   Levofloxacin     Weakness, myalgia   Levofloxacin    Lorazepam Other (See Comments)    Hallucination   Meloxicam Itching   Naproxen Itching   Naproxen Sodium Itching   Omeprazole-Sodium Bicarbonate     Chest pain-sore all over-HA   Ranitidine     Severe joint pain   Robaxin [Methocarbamol]     Made her very tired and unable to sleep at night   Tetracyclines & Related    Tizanidine     "knocked her out"- had a rebound migraine   Trazodone    Triamcinolone    Acetaminophen Rash   Etodolac Rash   Neosporin [Neomycin-Bacitracin Zn-Polymyx] Rash   Objective:   Today's Vitals   02/04/22 1532  BP: 110/78  Pulse: 72  Temp: 97.6 F (36.4 C)  TempSrc: Temporal  SpO2: 98%  Weight: 166 lb (75.3 kg)  Height: '5\' 3"'$  (1.6 m)   Body mass index is 29.41 kg/m.   General: Well  developed, well nourished. No acute distress. HEENT: Normocephalic, non-traumatic. Conjunctiva clear. External ears normal. There is congestion of the   TMs bilaterally without redness  and with normal light reflex. Nasal mucosa is swollen and red. The nasal   passages are quite narrow and there is scant rhinorrhea. Mucous membranes moist. Oropharynx clear. Good   dentition. Neck: Supple. No lymphadenopathy. No thyromegaly. Lungs: Clear to auscultation bilaterally. No wheezing, rales or rhonchi. Psych: Alert and oriented. Normal mood and affect.  Health Maintenance Due  Topic Date Due   HIV Screening  Never done   Hepatitis C Screening  Never done   Zoster Vaccines- Shingrix (1 of 2) Never done   PAP SMEAR-Modifier  Never done   MAMMOGRAM  Never done     Assessment & Plan:   1. Acute non-recurrent maxillary sinusitis Ms. Johnson's exam does not necessarily show any increased wheezing today. She does have findings consistent with sinusitis. I will go ahead and treat her with a course of azithromycin (in light of her multiple other antibiotic allergies). I will also prescribe some Tessalon as she has found this effective for her cough int he past.  - azithromycin (ZITHROMAX) 250 MG tablet; Take 2 tablets on day 1, then 1 tablet daily on days 2 through 5  Dispense: 6 tablet; Refill: 0 - benzonatate (TESSALON) 100 MG capsule; Take 1 capsule (100 mg total) by mouth 2 (two) times daily as needed for cough.  Dispense: 20 capsule; Refill: 0  Return if symptoms worsen or fail to improve.   Haydee Salter, MD

## 2022-02-08 ENCOUNTER — Encounter: Payer: Self-pay | Admitting: Family Medicine

## 2022-02-23 ENCOUNTER — Other Ambulatory Visit: Payer: Self-pay | Admitting: Family Medicine

## 2022-02-23 DIAGNOSIS — F908 Attention-deficit hyperactivity disorder, other type: Secondary | ICD-10-CM

## 2022-02-23 MED ORDER — METHYLPHENIDATE HCL 10 MG PO TABS: 10.0000 mg | ORAL_TABLET | Freq: Three times a day (TID) | ORAL | 0 refills | Status: DC

## 2022-02-23 NOTE — Telephone Encounter (Signed)
Refill request for:  Rialine 10 mg LR 01/25/22, #90, 0 rf LOV  12/20/21 FOV 02/25/22  Please review and advise. Thanks. Dm/cma

## 2022-02-25 ENCOUNTER — Ambulatory Visit: Payer: No Typology Code available for payment source | Admitting: Family Medicine

## 2022-02-25 VITALS — BP 128/80 | HR 91 | Temp 97.6°F | Ht 63.0 in | Wt 164.4 lb

## 2022-02-25 DIAGNOSIS — M5010 Cervical disc disorder with radiculopathy, unspecified cervical region: Secondary | ICD-10-CM

## 2022-02-25 DIAGNOSIS — M5137 Other intervertebral disc degeneration, lumbosacral region: Secondary | ICD-10-CM

## 2022-02-25 DIAGNOSIS — M47812 Spondylosis without myelopathy or radiculopathy, cervical region: Secondary | ICD-10-CM | POA: Insufficient documentation

## 2022-02-25 DIAGNOSIS — I10 Essential (primary) hypertension: Secondary | ICD-10-CM | POA: Diagnosis not present

## 2022-02-25 DIAGNOSIS — J4540 Moderate persistent asthma, uncomplicated: Secondary | ICD-10-CM

## 2022-02-25 NOTE — Progress Notes (Signed)
[facility] [place] [address] Dept: [phone] Dept Fax: [phone]  Chronic Care Office Visit  Subjective:    Patient ID: Samantha [name], female    DOB: 08-Sep-1971, 51 y.o..   MRN: [id number]  Chief Complaint  Patient presents with   Follow-up    2 month f/u. C/o still having back pain.  No OTC meds.     History of Present Illness:  Patient is in today for reassessment of chronic medical issues.  Samantha [name] has a history of hypertension and is managed on atenolol 25 mg daily.  At her last visit, Samantha [name] noted she suffered a recent fall. She has a significant history of cervical, thoracic, and lumbar spine complaints and prior surgeries. I had tried her on a trial of baclofen, but she felt this made her feel "high", so she stopped this. She is back to her baseline at this point. She does wonder if she is having progressive osteophyte development or new disc herniation/nerve compression. She notes she was previously offered nerve ablation and spinal stimulation options, but declined to proceed with these.   Samantha [name] notes her asthma and allergy symptoms are stable with her current regimen. She did have an asthma flare during the recent episode of increased environmental smoke related to [employer]. She had not realized that her window was cracked open. She does have multiple HEPA filters in her home. Her asthma is now calmed back down.She has been followed by an allergy/asthma specialist. She wonders if she needs ot see a pulmonologist related to this.   Past Medical History: Patient Active Problem List   Diagnosis Date Noted   Leg swelling 10/19/2021   Interstitial cystitis 09/15/2021   Other urethral stricture, female 09/15/2021   Chronic kidney disease, stage 3a ([place]) 08/20/2021   Prediabetes 08/27/2020   History of [place] spotted fever 02/04/2020   Moderate persistent  asthma without complication 76/73/4193   LPRD (laryngopharyngeal reflux disease) 08/02/2019   Chronic rhinitis 08/02/2019   Chronic post-traumatic stress disorder (PTSD) 11/21/2018   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) 11/21/2018   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/2018   Pulmonary nodules 11/13/2018   Other microscopic hematuria 11/06/2018   Anxiety and depression 07/04/2018   Fecal incontinence 07/02/2018   Leg weakness 06/12/2017   Ataxic gait 02/07/2017   Left ear hearing loss 02/07/2017   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[place]) 12/20/2016   Mixed hyperlipidemia 11/08/2016   Acute idiopathic gout of multiple sites 11/08/2016   Elbow pain, chronic, right 10/06/2016   Lateral epicondylitis, right elbow 10/06/2016   Fibromyalgia 10/04/2016   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   Migraine headache 07/29/2016   Irritable bowel syndrome with both constipation and diarrhea 07/29/2016   Hives 07/18/2016   Urinary incontinence 06/02/2016   Atherosclerosis of abdominal aorta ([place]) 09/03/2015   Carpal tunnel syndrome 11/19/2014   Primary hypersomnia 11/18/2014   Insomnia 11/18/2014   Lumbar radicular pain 11/18/2014   Chronic pain syndrome 11/18/2014   Cervical disc disorder with radiculopathy 11/18/2014   Chronic female pelvic pain 07/31/2014   Degeneration of intervertebral disc of lumbosacral region 07/31/2014   Rosacea 07/31/2014   Morton's neuroma 07/31/2014   Benign paroxysmal positional vertigo 07/31/2014   Essential hypertension 07/31/2014   History of methicillin resistant Staphylococcus aureus infection 07/09/2014   Past Surgical History:  Procedure Laterality Date   BLADDER SURGERY     age 34- 59   CERVICAL  FUSION     08/1998, 03/10/2017   CESAREAN SECTION     COLONOSCOPY     HAND SURGERY     2012, 2013   LAPAROSCOPIC ENDOMETRIOSIS FULGURATION Right    LESION REMOVAL  08/23/2012   Procedure: LESION REMOVAL HAND;   Surgeon: Cammie Sickle., MD;  Location: [facility];  Service: Orthopedics;  Laterality: Left;   ROTATOR CUFF REPAIR     UPPER GASTROINTESTINAL ENDOSCOPY     Family History  Problem Relation Age of Onset   ADD / ADHD Brother    Stomach cancer Maternal Grandfather    Colon cancer Neg Hx    Esophageal cancer Neg Hx    Rectal cancer Neg Hx    Outpatient Medications Prior to Visit  Medication Sig Dispense Refill   albuterol (VENTOLIN HFA) 108 (90 Base) MCG/ACT inhaler Inhale 2 puffs into the lungs every 4 (four) hours as needed for wheezing or shortness of breath. 18 g 1   ALPRAZolam (XANAX) 1 MG tablet Take 1 tablet (1 mg total) by mouth 2 (two) times daily as needed. 20 tablet 1   Aspirin (VAZALORE) 81 MG CAPS Take by mouth.     atenolol (TENORMIN) 25 MG tablet Take 0.5 tablets (12.5 mg total) by mouth daily as needed. Taking 1/2 tab daily 45 tablet 3   azelastine (ASTELIN) 0.1 % nasal spray Place 1 spray into both nostrils 2 (two) times daily as needed for rhinitis. 30 mL 5   benzonatate (TESSALON) 100 MG capsule Take 1 capsule (100 mg total) by mouth 2 (two) times daily as needed for cough. 20 capsule 0   budesonide (PULMICORT) 180 MCG/ACT inhaler Inhale 2 puffs into the lungs in the morning and at bedtime. 3 each 5   cholecalciferol (VITAMIN D) 1000 UNITS tablet Take by mouth Nightly.     diazepam (VALIUM) 10 MG tablet TAKE 1 TABLET BY MOUTH AT BEDTIME AS NEEDED FOR ANXIETY 30 tablet 1   diphenhydrAMINE (BENADRYL) 25 MG tablet Take 1 tablet (25 mg total) by mouth every 6 (six) hours as needed. 30 tablet 1   EPINEPHrine (PRIMATENE MIST) 0.125 MG/ACT AERO Inhale into the lungs.     EPIPEN 2-PAK 0.3 MG/0.3ML SOAJ injection INJECT INTO THE MUSCLE ONCE FOR ALLERGICREACTION 2 each 1   estradiol (ESTRACE) 0.1 MG/GM vaginal cream Place vaginally.     levalbuterol (XOPENEX HFA) 45 MCG/ACT inhaler Inhale 2 puffs into the lungs every 4 (four) hours as needed for wheezing. 15 g 1    MAGNESIUM PO Take by mouth as needed.     methylphenidate (RITALIN) 10 MG tablet Take 1 tablet (10 mg total) by mouth 3 (three) times daily with meals. 90 tablet 0   Pamabrom 50 MG CAPS Take 1 capsule by mouth daily as needed.     rosuvastatin (CRESTOR) 10 MG tablet Take 1 tablet (10 mg total) by mouth daily. 90 tablet 3   SUMAtriptan (IMITREX) 50 MG tablet Take 1 tablet (50 mg total) by mouth every 2 (two) hours as needed for migraine. 10 tablet 3   triamcinolone (NASACORT) 55 MCG/ACT AERO nasal inhaler spray 1 spray each nostril 1-2 times per day 16.5 g 5   No facility-administered medications prior to visit.   Allergies  Allergen Reactions   Bactrim Anaphylaxis   Bee Venom Hives   Chocolate Flavor Anaphylaxis   Clindamycin Shortness Of Breath   Coconut (Cocos Nucifera) Other (See Comments), Hives and Itching   Cymbalta [Duloxetine Hcl] Shortness  Of Breath   Duloxetine Anaphylaxis   Other Hives, Other (See Comments), Rash and Itching    Insect stings Wine spirit   Peanut-Containing Drug Products Swelling, Other (See Comments), Itching, Shortness Of Breath and Hives   Potassium-Containing Compounds Anaphylaxis, Itching and Swelling   Ramipril Anaphylaxis   Sulfamethoxazole-Trimethoprim Anaphylaxis   Aspartame Other (See Comments)   Cranberry Extract Other (See Comments)   Elemental Sulfur Itching   Sulfur Itching and Other (See Comments)   Adhesive [Tape]    Amoxicillin    Ciprofloxacin    Demeclocycline    Dexlansoprazole    Doxycycline     Possibly joint pain or itch    Erythromycin     Upset stomach   Erythromycin Base    Esomeprazole    Hydrocodone Itching   Hydrocodone Bit-Homatrop Mbr     Not true allergy. "irritated"   Hydrocodone Bit-Homatrop Mbr    Hydromorphone Nausea Only   Levofloxacin     Weakness, myalgia   Levofloxacin    Lorazepam Other (See Comments)    Hallucination   Meloxicam Itching   Naproxen Itching   Naproxen Sodium Itching    Omeprazole-Sodium Bicarbonate     Chest pain-sore all over-HA   Ranitidine     Severe joint pain   Robaxin [Methocarbamol]     Made her very tired and unable to sleep at night   Tetracyclines & Related    Tizanidine     "knocked her out"- had a rebound migraine   Trazodone    Triamcinolone    Acetaminophen Rash   Etodolac Rash   Neosporin [Neomycin-Bacitracin Zn-Polymyx] Rash     Objective:   Today's Vitals   02/25/22 0929  BP: 128/80  Pulse: 91  Temp: 97.6 F (36.4 C)  TempSrc: Temporal  SpO2: 96%  Weight: 164 lb 6.4 oz (74.6 kg)  Height: '5\' 3"'$  (1.6 m)   Body mass index is 29.12 kg/m.   General: Well developed, well nourished. No acute distress. Lungs: Clear to auscultation bilaterally. No wheezing, rales or rhonchi. Psych: Alert and oriented. Normal mood and affect.  Health Maintenance Due  Topic Date Due   HIV Screening  Never done   Hepatitis C Screening  Never done   Zoster Vaccines- Shingrix (1 of 2) Never done   PAP SMEAR-Modifier  Never done   COVID-19 Vaccine (6 - Booster for Pfizer series) 08/11/2021     Assessment & Plan:   1. Essential hypertension Blood pressure is at goal. Continue atenolol 25 mg daily.  2. Moderate persistent asthma without complication Stable on current regimen, despite recent flare due to environmental smoke. As her asthma does appear under good control, I do not feel there would be a role for a pulmonologist in her care at this point.  3. Cervical disc disorder with radiculopathy 4. Degeneration of intervertebral disc of lumbosacral region Samantha [name] appears to be managing with her back issues overall. She has a good sense of her limitations ad how to manage her symptoms. We discussed that there likely is not a need for more extensive evaluation at this point, esp. as she has declined more invasive options for management.  Return in about 2 months (around 04/27/2022) for Reassessment.   Haydee Salter, MD

## 2022-03-26 ENCOUNTER — Other Ambulatory Visit: Payer: Self-pay | Admitting: Family Medicine

## 2022-03-26 DIAGNOSIS — G43909 Migraine, unspecified, not intractable, without status migrainosus: Secondary | ICD-10-CM

## 2022-03-28 ENCOUNTER — Encounter: Payer: Self-pay | Admitting: Family Medicine

## 2022-03-28 ENCOUNTER — Other Ambulatory Visit: Payer: Self-pay | Admitting: Family Medicine

## 2022-03-28 DIAGNOSIS — F908 Attention-deficit hyperactivity disorder, other type: Secondary | ICD-10-CM

## 2022-03-28 DIAGNOSIS — F4001 Agoraphobia with panic disorder: Secondary | ICD-10-CM

## 2022-03-28 DIAGNOSIS — G43909 Migraine, unspecified, not intractable, without status migrainosus: Secondary | ICD-10-CM

## 2022-03-28 MED ORDER — ALPRAZOLAM 1 MG PO TABS: 1.0000 mg | ORAL_TABLET | Freq: Two times a day (BID) | ORAL | 1 refills | Status: DC | PRN

## 2022-03-28 MED ORDER — SUMATRIPTAN SUCCINATE 50 MG PO TABS: 50.0000 mg | ORAL_TABLET | ORAL | 3 refills | Status: DC | PRN

## 2022-04-01 ENCOUNTER — Other Ambulatory Visit: Payer: Self-pay | Admitting: Family Medicine

## 2022-04-01 ENCOUNTER — Other Ambulatory Visit: Payer: Self-pay | Admitting: Allergy and Immunology

## 2022-04-01 ENCOUNTER — Other Ambulatory Visit: Payer: Self-pay | Admitting: Nurse Practitioner

## 2022-04-01 DIAGNOSIS — F908 Attention-deficit hyperactivity disorder, other type: Secondary | ICD-10-CM

## 2022-04-01 MED ORDER — SUMATRIPTAN SUCCINATE 50 MG PO TABS: 50.0000 mg | ORAL_TABLET | ORAL | 1 refills | Status: DC | PRN

## 2022-04-01 MED ORDER — METHYLPHENIDATE HCL 10 MG PO TABS: 10.0000 mg | ORAL_TABLET | Freq: Three times a day (TID) | ORAL | 0 refills | Status: DC

## 2022-04-01 MED ORDER — ALPRAZOLAM 1 MG PO TABS: 1.0000 mg | ORAL_TABLET | Freq: Two times a day (BID) | ORAL | 1 refills | Status: DC | PRN

## 2022-04-01 NOTE — Telephone Encounter (Signed)
Refill request for the following:   Ritalin 10 mg  LR 02/23/22, #90, 0 rf LOV  02/25/22 FOV 04/27/22  Xanax 1 mg  Imitrex 50 mg.    Last fill for Xanax and Imitrex that you sent went to a pharmacy she is unaware of.  Can you please and thank you [facility] [address] Thanks. Dm/cma

## 2022-04-05 NOTE — Telephone Encounter (Signed)
Duplicate request unable to deny it. Dm/cma

## 2022-04-12 ENCOUNTER — Ambulatory Visit (INDEPENDENT_AMBULATORY_CARE_PROVIDER_SITE_OTHER): Payer: No Typology Code available for payment source | Admitting: Allergy and Immunology

## 2022-04-12 ENCOUNTER — Encounter: Payer: Self-pay | Admitting: Allergy and Immunology

## 2022-04-12 VITALS — BP 126/82 | HR 68 | Temp 98.1°F | Resp 16 | Ht 63.0 in | Wt 162.0 lb

## 2022-04-12 DIAGNOSIS — K219 Gastro-esophageal reflux disease without esophagitis: Secondary | ICD-10-CM

## 2022-04-12 DIAGNOSIS — J4550 Severe persistent asthma, uncomplicated: Secondary | ICD-10-CM

## 2022-04-12 DIAGNOSIS — R232 Flushing: Secondary | ICD-10-CM

## 2022-04-12 DIAGNOSIS — J383 Other diseases of vocal cords: Secondary | ICD-10-CM

## 2022-04-12 DIAGNOSIS — L500 Allergic urticaria: Secondary | ICD-10-CM

## 2022-04-12 DIAGNOSIS — J0100 Acute maxillary sinusitis, unspecified: Secondary | ICD-10-CM

## 2022-04-12 DIAGNOSIS — J3089 Other allergic rhinitis: Secondary | ICD-10-CM

## 2022-04-12 MED ORDER — LEVALBUTEROL TARTRATE 45 MCG/ACT IN AERO: 2.0000 | INHALATION_SPRAY | Freq: Four times a day (QID) | RESPIRATORY_TRACT | 1 refills | Status: DC | PRN

## 2022-04-12 MED ORDER — BENZONATATE 100 MG PO CAPS: 100.0000 mg | ORAL_CAPSULE | Freq: Three times a day (TID) | ORAL | 0 refills | Status: DC | PRN

## 2022-04-12 MED ORDER — EPINEPHRINE 0.3 MG/0.3ML IJ SOAJ: 0.3000 mg | INTRAMUSCULAR | 1 refills | Status: DC | PRN

## 2022-04-12 MED ORDER — TRIAMCINOLONE ACETONIDE 55 MCG/ACT NA AERO: INHALATION_SPRAY | NASAL | 5 refills | Status: DC

## 2022-04-12 MED ORDER — ALBUTEROL SULFATE HFA 108 (90 BASE) MCG/ACT IN AERS: 2.0000 | INHALATION_SPRAY | RESPIRATORY_TRACT | 1 refills | Status: DC | PRN

## 2022-04-12 MED ORDER — BUDESONIDE 180 MCG/ACT IN AEPB
2.0000 | INHALATION_SPRAY | Freq: Two times a day (BID) | RESPIRATORY_TRACT | 5 refills | Status: DC
Start: 2022-04-12 — End: 2023-02-09

## 2022-04-12 MED ORDER — FEXOFENADINE HCL 180 MG PO TABS: 180.0000 mg | ORAL_TABLET | Freq: Every day | ORAL | 5 refills | Status: DC | PRN

## 2022-04-12 MED ORDER — AZELASTINE HCL 0.1 % NA SOLN: 1.0000 | Freq: Two times a day (BID) | NASAL | 5 refills | Status: DC | PRN

## 2022-04-12 NOTE — Patient Instructions (Signed)
  1. Continue treatment of asthma:   A. Pulmicort 160 - 2 inhalations 2 times per day  2. Continue treatment of allergic rhinitis:   A. OTC Nasacort - 1 spray each nostril 1-2 times per day  B. Azelastine nasal spray - 1 spray each nostril 1-2 times per day  3. Treat reflux:   A. Gaviscon  3. If needed:  A. Antihistamine B. Auvi-Q 0.3 / Epi-Pen C. Levalbuterol / albuterol D. Positive pressure inflation of ears E. Tessalon pearls 100 mg -1 tablet every 8 hours if needed for cough  4. Return to clinic in late 6 months or earlier if problem  5. Obtain fall flu vaccine  6. Evaluation with [facility] Allergy department

## 2022-04-12 NOTE — Progress Notes (Unsigned)
[name]   Follow-up Note  Referring Provider: Haydee Salter, MD Primary Provider: Haydee Salter, MD Date of Office Visit: 04/12/2022  Subjective:   Samantha [name] (DOB: 05/05/1971) is a 51 y.o. female who returns to the [facility] on 04/12/2022 in re-evaluation of the following:  HPI: Samantha [name] returns to this clinic in evaluation of asthma, allergic rhinitis, LPR, vocal cord dysfunction, and a history of urticaria/flushing disorder with unexplained etiology.  Her last visit to this clinic was 07 December 2021.  She relates a story of having numerous "asthma attacks" and numerous throat closing episodes for which she has had to use epinephrine multiple times per month and has had to use a short acting bronchodilator multiple times per week.  She has been using some Tessalon Perles for coughing attacks which actually appear to help her somewhat.  She continues on Symbicort on a regular basis and continues on nasal steroid and nasal antihistamine on a regular basis.  She believes that her reflux is doing pretty good at this point in time while using Gaviscon as her only treatment.  She believes that her skin has been doing well without any urticaria or flushing as long as she does not get out in the sun.  Allergies as of 04/12/2022       Reactions   Bactrim Anaphylaxis   Bee Venom Hives   Chocolate Flavor Anaphylaxis   Clindamycin Shortness Of Breath   Coconut (cocos Nucifera) Other (See Comments), Hives, Itching   Cymbalta [duloxetine Hcl] Shortness Of Breath   Duloxetine Anaphylaxis   Other Hives, Other (See Comments), Rash, Itching   Insect stings Wine spirit   Peanut-containing Drug Products Swelling, Other (See Comments), Itching, Shortness Of Breath, Hives   Potassium-containing Compounds Anaphylaxis, Itching, Swelling   Ramipril Anaphylaxis   Sulfamethoxazole-trimethoprim Anaphylaxis   Aspartame  Other (See Comments)   Cranberry Extract Other (See Comments)   Elemental Sulfur Itching   Sulfur Itching, Other (See Comments)   Adhesive [tape]    Amoxicillin    Ciprofloxacin    Demeclocycline    Dexlansoprazole    Doxycycline    Possibly joint pain or itch   Erythromycin    Upset stomach   Erythromycin Base    Esomeprazole    Hydrocodone Itching   Hydrocodone Bit-homatrop Mbr    Not true allergy. "irritated"   Hydrocodone Bit-homatrop Mbr    Hydromorphone Nausea Only   Levofloxacin    Weakness, myalgia   Levofloxacin    Lorazepam Other (See Comments)   Hallucination   Meloxicam Itching   Naproxen Itching   Naproxen Sodium Itching   Omeprazole-sodium Bicarbonate    Chest pain-sore all over-HA   Ranitidine    Severe joint pain   Robaxin [methocarbamol]    Made her very tired and unable to sleep at night   Tetracyclines & Related    Tizanidine    "knocked her out"- had a rebound migraine   Trazodone    Triamcinolone    Acetaminophen Rash   Etodolac Rash   Neosporin [neomycin-bacitracin Zn-polymyx] Rash        Medication List    albuterol 108 (90 Base) MCG/ACT inhaler Commonly known as: VENTOLIN HFA Inhale 2 puffs into the lungs every 4 (four) hours as needed for wheezing or shortness of breath.   ALPRAZolam 1 MG tablet Commonly known as: XANAX Take 1 tablet (1 mg total) by mouth 2 (two) times  daily as needed.   atenolol 25 MG tablet Commonly known as: TENORMIN Take 0.5 tablets (12.5 mg total) by mouth daily as needed. Taking 1/2 tab daily   azelastine 0.1 % nasal spray Commonly known as: ASTELIN Place 1 spray into both nostrils 2 (two) times daily as needed for rhinitis.   benzonatate 100 MG capsule Commonly known as: TESSALON Take 1 capsule (100 mg total) by mouth 2 (two) times daily as needed for cough.   budesonide 180 MCG/ACT inhaler Commonly known as: PULMICORT Inhale 2 puffs into the lungs in the morning and at bedtime.   cholecalciferol  1000 units tablet Commonly known as: VITAMIN D Take by mouth Nightly.   diphenhydrAMINE 25 MG tablet Commonly known as: BENADRYL Take 1 tablet (25 mg total) by mouth every 6 (six) hours as needed.   EpiPen 2-Pak 0.3 mg/0.3 mL Soaj injection Generic drug: EPINEPHrine INJECT INTO THE MUSCLE ONCE FOR ALLERGICREACTION   estradiol 0.1 MG/GM vaginal cream Commonly known as: ESTRACE Place vaginally.   levalbuterol 45 MCG/ACT inhaler Commonly known as: XOPENEX HFA INHALE 2 PUFFS BY MOUTH EVERY 4 HOURS AS NEEDED FOR WHEEZING   MAGNESIUM PO Take by mouth as needed.   methylphenidate 10 MG tablet Commonly known as: Ritalin Take 1 tablet (10 mg total) by mouth 3 (three) times daily with meals.   Pamabrom 50 MG Caps Take 1 capsule by mouth daily as needed.   Primatene Mist 0.125 MG/ACT Aero Generic drug: EPINEPHrine Inhale into the lungs.   rosuvastatin 10 MG tablet Commonly known as: CRESTOR Take 1 tablet (10 mg total) by mouth daily.   SUMAtriptan 50 MG tablet Commonly known as: IMITREX Take 1 tablet (50 mg total) by mouth every 2 (two) hours as needed for migraine.   triamcinolone 55 MCG/ACT Aero nasal inhaler Commonly known as: NASACORT spray 1 spray each nostril 1-2 times per day   [place] 81 MG Caps Generic drug: Aspirin Take by mouth.    Past Medical History:  Diagnosis Date   ADD (attention deficit disorder)    Allergy    Anaphylaxis    Arthritis    Asthma    Chronic back pain    Chronic neck pain    Endometriosis    Fibromyalgia    GERD (gastroesophageal reflux disease)    Hernia    Hyperlipidemia    Hypertension    IBS (irritable bowel syndrome)    IC (interstitial cystitis)    Low blood potassium    Multiple allergies    OCD (obsessive compulsive disorder)    Raynaud disease    Vertigo     Past Surgical History:  Procedure Laterality Date   BLADDER SURGERY     age 77- 69   CERVICAL FUSION     08/1998, 03/10/2017   CESAREAN SECTION      COLONOSCOPY     HAND SURGERY     2012, 2013   LAPAROSCOPIC ENDOMETRIOSIS FULGURATION Right    LESION REMOVAL  08/23/2012   Procedure: LESION REMOVAL HAND;  Surgeon: Cammie Sickle., MD;  Location: [facility];  Service: Orthopedics;  Laterality: Left;   ROTATOR CUFF REPAIR     UPPER GASTROINTESTINAL ENDOSCOPY      Review of systems negative except as noted in HPI / PMHx or noted below:  Review of Systems  Constitutional: Negative.   HENT: Negative.    Eyes: Negative.   Respiratory: Negative.    Cardiovascular: Negative.   Gastrointestinal: Negative.   Genitourinary:  Negative.   Musculoskeletal: Negative.   Skin: Negative.   Neurological: Negative.   Endo/Heme/Allergies: Negative.   Psychiatric/Behavioral: Negative.       Objective:   Vitals:   04/12/22 1529 04/12/22 1618  BP: 126/82   Pulse: 88 68  Resp: 16 16  Temp: 98.1 F (36.7 C)   SpO2: 96% 97%   Height: '5\' 3"'$  (160 cm)  Weight: 162 lb (73.5 kg)   Physical Exam Constitutional:      Appearance: She is not diaphoretic.  HENT:     Head: Normocephalic.     Right Ear: Tympanic membrane, ear canal and external ear normal.     Left Ear: Tympanic membrane, ear canal and external ear normal.     Nose: Nose normal. No mucosal edema or rhinorrhea.     Mouth/Throat:     Pharynx: Uvula midline. No oropharyngeal exudate.  Eyes:     Conjunctiva/sclera: Conjunctivae normal.  Neck:     Thyroid: No thyromegaly.     Trachea: Trachea normal. No tracheal tenderness or tracheal deviation.  Cardiovascular:     Rate and Rhythm: Normal rate and regular rhythm.     Heart sounds: Normal heart sounds, S1 normal and S2 normal. No murmur heard. Pulmonary:     Effort: No respiratory distress.     Breath sounds: Normal breath sounds. No stridor. No wheezing or rales.  Lymphadenopathy:     Head:     Right side of head: No tonsillar adenopathy.     Left side of head: No tonsillar adenopathy.     Cervical: No  cervical adenopathy.  Skin:    Findings: No erythema or rash.     Nails: There is no clubbing.  Neurological:     Mental Status: She is alert.     Diagnostics:    Spirometry was performed and demonstrated an FEV1 of 1,92 at 72 % of predicted.  Assessment and Plan:   1. Not well controlled severe persistent asthma   2. LPRD (laryngopharyngeal reflux disease)   3. Vocal cord dysfunction   4. Other allergic rhinitis   5. Allergic urticaria   6. Flushing      1. Continue treatment of asthma:   A. Pulmicort 160 - 2 inhalations 2 times per day  2. Continue treatment of allergic rhinitis:   A. OTC Nasacort - 1 spray each nostril 1-2 times per day  B. Azelastine nasal spray - 1 spray each nostril 1-2 times per day  3. Treat reflux:   A. Gaviscon  3. If needed:  A. Antihistamine B. Auvi-Q 0.3 / Epi-Pen C. Levalbuterol / albuterol D. Positive pressure inflation of ears E. Tessalon pearls 100 mg -1 tablet every 8 hours if needed for cough  4. Return to clinic in late 6 months or earlier if problem  5. Obtain fall flu vaccine  6. Evaluation with [facility] Allergy department  I do not think that I have been able to get Rochelles's respiratory tract inflammatory issues under good control and I would like to refer her to [facility] allergy department.  Certainly there appears to be a component of vocal cord dysfunction that is accompanying her respiratory tract issues but it is hard to sort through which of her respiratory tract problems are playing the greater role in giving her the symptoms that she describes.  Hopefully we can get that appointment arranged sometime in the near future.  We have tried to send her to the allergy department at [facility] in the past  but for some reason the logistics there is broke down.  Allena Katz, MD Allergy / Immunology [place]

## 2022-04-13 ENCOUNTER — Encounter: Payer: Self-pay | Admitting: Allergy and Immunology

## 2022-04-20 ENCOUNTER — Encounter: Payer: Self-pay | Admitting: Allergy and Immunology

## 2022-04-27 ENCOUNTER — Encounter: Payer: Self-pay | Admitting: Family Medicine

## 2022-04-27 ENCOUNTER — Ambulatory Visit: Payer: No Typology Code available for payment source | Admitting: Family Medicine

## 2022-04-27 VITALS — BP 124/80 | HR 60 | Temp 97.1°F | Wt 161.8 lb

## 2022-04-27 DIAGNOSIS — F419 Anxiety disorder, unspecified: Secondary | ICD-10-CM | POA: Diagnosis not present

## 2022-04-27 DIAGNOSIS — L239 Allergic contact dermatitis, unspecified cause: Secondary | ICD-10-CM

## 2022-04-27 DIAGNOSIS — G43909 Migraine, unspecified, not intractable, without status migrainosus: Secondary | ICD-10-CM | POA: Diagnosis not present

## 2022-04-27 DIAGNOSIS — F32A Depression, unspecified: Secondary | ICD-10-CM

## 2022-04-27 MED ORDER — DIAZEPAM 10 MG PO TABS: 10.0000 mg | ORAL_TABLET | Freq: Every day | ORAL | 1 refills | Status: DC | PRN

## 2022-04-27 MED ORDER — SUMATRIPTAN SUCCINATE 50 MG PO TABS: 50.0000 mg | ORAL_TABLET | ORAL | 1 refills | Status: DC | PRN

## 2022-04-27 MED ORDER — METHYLPREDNISOLONE 4 MG PO TBPK: ORAL_TABLET | ORAL | 0 refills | Status: DC

## 2022-04-27 NOTE — Progress Notes (Signed)
[facility] Samantha [name] [place] [address] Dept: [phone] Dept Fax: [phone]  Chronic Care Office Visit  Subjective:    Patient ID: Samantha [name], female    DOB: Aug 05, 1971, 51 y.o..   MRN: [id number]  Chief Complaint  Patient presents with   Follow-up    2 month follow up. Patient has a rash on her lower extremities since Saturday (05/24/2022).     History of Present Illness:  Patient is in today for reassessment of chronic medical issues.  Ms. Samantha [name] has a history of hypertension and is managed on atenolol 25 mg daily.   Ms. Samantha [name] has a history of asthma and allergy symptoms. She is followed by Dr. Gaylyn Rong (allergist). He has concerns for ongoing respiratory inflammation and is trying to get Ms. Samantha [name] in with [facility] for further evaluation.  Ms. Samantha [name] notes that she went to the [employer] on Sat. She ntoes after returnign home, she began to have itching on her legs associated with small bumps. She has had progression up onto the trunk. She has been using Benadryl. She has also been washing the sites and applying iodine. She is worried about having a recurrence of an MRSA infeciton that she had int he past. She thinks the bumps may related to insect bites. She does recall walking through some tall grass to llok at some old farm equipment.  Past Medical History: Patient Active Problem List   Diagnosis Date Noted   Cervical spine arthritis 02/25/2022   Leg swelling 10/19/2021   Interstitial cystitis 09/15/2021   Other urethral stricture, female 09/15/2021   Chronic kidney disease, stage 3a ([place]) 08/20/2021   Prediabetes 08/27/2020   History of [place] spotted fever 02/04/2020   Moderate persistent asthma without complication 86/76/1950   LPRD (laryngopharyngeal reflux disease) 08/02/2019   Chronic rhinitis 08/02/2019   Chronic post-traumatic stress disorder (PTSD) 11/21/2018    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) 11/21/2018   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/2018   Pulmonary nodules 11/13/2018   Other microscopic hematuria 11/06/2018   Anxiety and depression 07/04/2018   Fecal incontinence 07/02/2018   Leg weakness 06/12/2017   Ataxic gait 02/07/2017   Left ear hearing loss 02/07/2017   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   Mixed hyperlipidemia 11/08/2016   Acute idiopathic gout of multiple sites 11/08/2016   Elbow pain, chronic, right 10/06/2016   Lateral epicondylitis, right elbow 10/06/2016   Fibromyalgia 10/04/2016   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   Migraine headache 07/29/2016   Irritable bowel syndrome with both constipation and diarrhea 07/29/2016   Hives 07/18/2016   Urinary incontinence 06/02/2016   Atherosclerosis of abdominal aorta ([place]) 09/03/2015   Carpal tunnel syndrome 11/19/2014   Primary hypersomnia 11/18/2014   Insomnia 11/18/2014   Lumbar radicular pain 11/18/2014   Chronic pain syndrome 11/18/2014   Cervical disc disorder with radiculopathy 11/18/2014   Chronic female pelvic pain 07/31/2014   Degeneration of intervertebral disc of lumbosacral region 07/31/2014   Rosacea 07/31/2014   Morton's neuroma 07/31/2014   Benign paroxysmal positional vertigo 07/31/2014   Essential hypertension 07/31/2014   History of methicillin resistant Staphylococcus aureus infection 07/09/2014   Past Surgical History:  Procedure Laterality Date   BLADDER SURGERY     age 78- 9   CERVICAL FUSION     08/1998, 03/10/2017   CESAREAN SECTION     COLONOSCOPY     HAND SURGERY     2012, 2013  LAPAROSCOPIC ENDOMETRIOSIS FULGURATION Right    LESION REMOVAL  08/23/2012   Procedure: LESION REMOVAL HAND;  Surgeon: Cammie Sickle., MD;  Location: [place];  Service: Orthopedics;  Laterality: Left;   ROTATOR CUFF REPAIR     UPPER GASTROINTESTINAL ENDOSCOPY     Family  History  Problem Relation Age of Onset   ADD / ADHD Brother    Stomach cancer Maternal Grandfather    Colon cancer Neg Hx    Esophageal cancer Neg Hx    Rectal cancer Neg Hx    Outpatient Medications Prior to Visit  Medication Sig Dispense Refill   albuterol (VENTOLIN HFA) 108 (90 Base) MCG/ACT inhaler Inhale 2 puffs into the lungs every 4 (four) hours as needed for wheezing or shortness of breath. 18 g 1   ALPRAZolam (XANAX) 1 MG tablet Take 1 tablet (1 mg total) by mouth 2 (two) times daily as needed. 20 tablet 1   Aspirin (VAZALORE) 81 MG CAPS Take by mouth.     atenolol (TENORMIN) 25 MG tablet Take 0.5 tablets (12.5 mg total) by mouth daily as needed. Taking 1/2 tab daily 45 tablet 3   azelastine (ASTELIN) 0.1 % nasal spray Place 1 spray into both nostrils 2 (two) times daily as needed for rhinitis. 30 mL 5   benzonatate (TESSALON) 100 MG capsule Take 1 capsule (100 mg total) by mouth 3 (three) times daily as needed for cough. 90 capsule 0   budesonide (PULMICORT) 180 MCG/ACT inhaler Inhale 2 puffs into the lungs in the morning and at bedtime. 1 each 5   cholecalciferol (VITAMIN D) 1000 UNITS tablet Take by mouth Nightly.     diphenhydrAMINE (BENADRYL) 25 MG tablet Take 1 tablet (25 mg total) by mouth every 6 (six) hours as needed. 30 tablet 1   EPINEPHrine (AUVI-Q) 0.3 mg/0.3 mL IJ SOAJ injection Inject 0.3 mg into the muscle as needed for anaphylaxis. As directed for life-threatening allergic reactions 2 each 1   EPINEPHrine (PRIMATENE MIST) 0.125 MG/ACT AERO Inhale into the lungs.     EPIPEN 2-PAK 0.3 MG/0.3ML SOAJ injection INJECT INTO THE MUSCLE ONCE FOR ALLERGICREACTION 2 each 1   estradiol (ESTRACE) 0.1 MG/GM vaginal cream Place vaginally.     levalbuterol (XOPENEX HFA) 45 MCG/ACT inhaler Inhale 2 puffs into the lungs every 6 (six) hours as needed for wheezing. 15 g 1   MAGNESIUM PO Take by mouth as needed.     methylphenidate (RITALIN) 10 MG tablet Take 1 tablet (10 mg total) by  mouth 3 (three) times daily with meals. 90 tablet 0   Pamabrom 50 MG CAPS Take 1 capsule by mouth daily as needed.     rosuvastatin (CRESTOR) 10 MG tablet Take 1 tablet (10 mg total) by mouth daily. 90 tablet 3   triamcinolone (NASACORT) 55 MCG/ACT AERO nasal inhaler spray 1 spray each nostril 1-2 times per day 16.5 g 5   diazepam (VALIUM) 10 MG tablet Take 10 mg by mouth daily as needed for anxiety.     SUMAtriptan (IMITREX) 50 MG tablet Take 1 tablet (50 mg total) by mouth every 2 (two) hours as needed for migraine. 10 tablet 1   fexofenadine (ALLEGRA) 180 MG tablet Take 1 tablet (180 mg total) by mouth daily as needed for allergies or rhinitis (Can take an extra dose during flare ups.). 60 tablet 5   No facility-administered medications prior to visit.   Allergies  Allergen Reactions   Bactrim Anaphylaxis   Bee  Venom Hives   Chocolate Flavor Anaphylaxis   Clindamycin Shortness Of Breath   Coconut (Cocos Nucifera) Other (See Comments), Hives and Itching   Cymbalta [Duloxetine Hcl] Shortness Of Breath   Duloxetine Anaphylaxis   Other Hives, Other (See Comments), Rash and Itching    Insect stings Wine spirit   Peanut-Containing Drug Products Swelling, Other (See Comments), Itching, Shortness Of Breath and Hives   Potassium-Containing Compounds Anaphylaxis, Itching and Swelling   Ramipril Anaphylaxis   Sulfamethoxazole-Trimethoprim Anaphylaxis   Aspartame Other (See Comments)   Cranberry Extract Other (See Comments)   Elemental Sulfur Itching   Sulfur Itching and Other (See Comments)   Adhesive [Tape]    Amoxicillin    Ciprofloxacin    Demeclocycline    Dexlansoprazole    Doxycycline     Possibly joint pain or itch    Erythromycin     Upset stomach   Erythromycin Base    Esomeprazole    Hydrocodone Itching   Hydrocodone Bit-[place] Mbr     Not true allergy. "irritated"   Hydrocodone Bit-Homatrop Mbr    Hydromorphone Nausea Only   Levofloxacin     Weakness, myalgia    Levofloxacin    Lorazepam Other (See Comments)    Hallucination   Meloxicam Itching   Naproxen Itching   Naproxen Sodium Itching   Omeprazole-Sodium Bicarbonate     Chest pain-sore all over-HA   Ranitidine     Severe joint pain   Robaxin [Methocarbamol]     Made her very tired and unable to sleep at night   Tetracyclines & Related    Tizanidine     "knocked her out"- had a rebound migraine   Trazodone    Triamcinolone    Acetaminophen Rash   Etodolac Rash   Neosporin [Neomycin-Bacitracin Zn-Polymyx] Rash    Objective:   Today's Vitals   04/27/22 1304  BP: 124/80  Pulse: 60  Temp: (!) 97.1 F (36.2 C)  TempSrc: Temporal  SpO2: 96%  Weight: 161 lb 12.8 oz (73.4 kg)   Body mass index is 28.66 kg/m.   General: Well developed, well nourished. No acute distress. Lungs: Clear to auscultation bilaterally. No wheezing, rales or rhonchi. Skin: Warm and dry. There are scattered excoriated papular lesions on the lower legs and trunk. There   is a yellow discoloration around each site from iodine the patient applied. Neuro: CN II-XII intact. Normal sensation and DTR bilaterally. Psych: Alert and oriented. Normal mood and affect.  Health Maintenance Due  Topic Date Due   HIV Screening  Never done   Hepatitis C Screening  Never done   Zoster Vaccines- Shingrix (1 of 2) Never done   PAP SMEAR-Modifier  Never done   COVID-19 Vaccine (6 - Pfizer risk series) 08/11/2021   INFLUENZA VACCINE  04/12/2022     Assessment & Plan:   1. Anxiety and depression Overall stable. She continues with judicious use of BZDs for management of her anxiety. She has an extensive list of medication allergies, some of which are for psychotherapeutics, limiting other options.  - diazepam (VALIUM) 10 MG tablet; Take 1 tablet (10 mg total) by mouth daily as needed for anxiety.  Dispense: 30 tablet; Refill: 1  2. Migraine without status migrainosus, not intractable, unspecified migraine type Stable  with use of Imitrex for acute migraine management.  - SUMAtriptan (IMITREX) 50 MG tablet; Take 1 tablet (50 mg total) by mouth every 2 (two) hours as needed for migraine.  Dispense: 10 tablet; Refill: 1  3. Allergic dermatitis The rash is indistinct, but could represent insect bites. She is already using a first generation antihistamine. I will try a course of prednisone to see if this will better manage her symptoms.  - methylPREDNISolone (MEDROL DOSEPAK) 4 MG TBPK tablet; Take as directed in packaging  Dispense: 21 tablet; Refill: 0   Return in about 2 months (around 06/27/2022) for Reassessment.   Haydee Salter, MD

## 2022-05-13 ENCOUNTER — Other Ambulatory Visit: Payer: Self-pay | Admitting: Family Medicine

## 2022-05-13 DIAGNOSIS — F908 Attention-deficit hyperactivity disorder, other type: Secondary | ICD-10-CM

## 2022-05-13 MED ORDER — METHYLPHENIDATE HCL 10 MG PO TABS: 10.0000 mg | ORAL_TABLET | Freq: Three times a day (TID) | ORAL | 0 refills | Status: DC

## 2022-05-13 NOTE — Telephone Encounter (Signed)
Refill request for  Ritalin 10 mg LR 04/01/22, #90. 0 rf LOV 04/27/22 FOV  07/12/22   Please review and advise.  Thanks .  Dm/cma

## 2022-05-26 ENCOUNTER — Other Ambulatory Visit: Payer: Self-pay | Admitting: Allergy and Immunology

## 2022-06-08 ENCOUNTER — Encounter: Payer: Self-pay | Admitting: Internal Medicine

## 2022-06-08 ENCOUNTER — Ambulatory Visit (INDEPENDENT_AMBULATORY_CARE_PROVIDER_SITE_OTHER): Payer: No Typology Code available for payment source | Admitting: Internal Medicine

## 2022-06-08 VITALS — BP 102/68 | HR 70 | Temp 98.6°F | Resp 12

## 2022-06-08 DIAGNOSIS — K219 Gastro-esophageal reflux disease without esophagitis: Secondary | ICD-10-CM | POA: Diagnosis not present

## 2022-06-08 DIAGNOSIS — L501 Idiopathic urticaria: Secondary | ICD-10-CM

## 2022-06-08 DIAGNOSIS — J383 Other diseases of vocal cords: Secondary | ICD-10-CM

## 2022-06-08 DIAGNOSIS — J4550 Severe persistent asthma, uncomplicated: Secondary | ICD-10-CM | POA: Diagnosis not present

## 2022-06-08 DIAGNOSIS — R053 Chronic cough: Secondary | ICD-10-CM

## 2022-06-08 DIAGNOSIS — R232 Flushing: Secondary | ICD-10-CM

## 2022-06-08 MED ORDER — BENZONATATE 100 MG PO CAPS: 100.0000 mg | ORAL_CAPSULE | Freq: Three times a day (TID) | ORAL | 0 refills | Status: DC | PRN

## 2022-06-08 NOTE — Progress Notes (Addendum)
[facility] [place] [facility] [place]   Follow-up Note  Referring Provider: Haydee Salter, MD Primary Provider: Haydee Salter, MD Date of Office Visit: 06/08/2022  Subjective:   Samantha [name] (DOB: Apr 09, 1971) is a 51 y.o. female who returns to the [facility] on 06/08/2022 for follow up.   HPI: Samantha [name] returns to this clinic in evaluation of asthma, allergic rhinitis, LPR, vocal cord dysfunction, and a history of urticaria/flushing disorder with unexplained etiology.  Her last visit was with Dr. Neldon Mc on 04/2022.  At that visit, he had planned to refer her to  [facility]  for second opinion which she has upcoming in January 2024.   She reports still having frequent episodes where she feels throat spasms/tickle, hoarseness, coughing up mucous.  No cutaneous symptoms. She has tried using her levalbuterol for this in the past 1-2 weeks which sometimes helps but not always.  She also is using tessalon pearls and mucinex PRN and would like a refill of those.  She is worried with the upcoming cold weather, her symptoms are usually more frequent and worse.  Some episodes are severe enough requiring use of Epi.  Last episode of Epipen use was in June 2023 where she was at a neighbor's house and had exposure to smoke after which she had coughing followed by mucous coming out of her nose and mouth and then collapsed.  She did self administer the Epi at the time.  Her son was present and afterwards took her home.  She takes Pulmicort 2 puffs daily or BID as she sometimes forgets.    Her GERD is doing well with PRN Gaviscon.   She does have increased symptoms of congestion and rhinorrhea.  She is using Nasacort and Azelastine 1 SEN BID.    No recent flushing or urticaria.    Pertinent ROS as discussed in HPI.  Allergies as of 04/12/2022       Reactions   Bactrim Anaphylaxis   Bee Venom Hives   Chocolate Flavor Anaphylaxis   Clindamycin Shortness Of  Breath   Coconut (cocos Nucifera) Other (See Comments), Hives, Itching   Cymbalta [duloxetine Hcl] Shortness Of Breath   Duloxetine Anaphylaxis   Other Hives, Other (See Comments), Rash, Itching   Insect stings Wine spirit   Peanut-containing Drug Products Swelling, Other (See Comments), Itching, Shortness Of Breath, Hives   Potassium-containing Compounds Anaphylaxis, Itching, Swelling   Ramipril Anaphylaxis   Sulfamethoxazole-trimethoprim Anaphylaxis   Aspartame Other (See Comments)   Cranberry Extract Other (See Comments)   Elemental Sulfur Itching   Sulfur Itching, Other (See Comments)   Adhesive [tape]    Amoxicillin    Ciprofloxacin    Demeclocycline    Dexlansoprazole    Doxycycline    Possibly joint pain or itch   Erythromycin    Upset stomach   Erythromycin Base    Esomeprazole    Hydrocodone Itching   Hydrocodone Bit-homatrop Mbr    Not true allergy. "irritated"   Hydrocodone Bit-homatrop Mbr    Hydromorphone Nausea Only   Levofloxacin    Weakness, myalgia   Levofloxacin    Lorazepam Other (See Comments)   Hallucination   Meloxicam Itching   Naproxen Itching   Naproxen Sodium Itching   Omeprazole-sodium Bicarbonate    Chest pain-sore all over-HA   Ranitidine    Severe joint pain   Robaxin [methocarbamol]    Made her very tired and unable to sleep at night  Tetracyclines & Related    Tizanidine    "knocked her out"- had a rebound migraine   Trazodone    Triamcinolone    Acetaminophen Rash   Etodolac Rash   Neosporin [neomycin-bacitracin Zn-polymyx] Rash        Medication List    albuterol 108 (90 Base) MCG/ACT inhaler Commonly known as: VENTOLIN HFA Inhale 2 puffs into the lungs every 4 (four) hours as needed for wheezing or shortness of breath.   ALPRAZolam 1 MG tablet Commonly known as: XANAX Take 1 tablet (1 mg total) by mouth 2 (two) times daily as needed.   atenolol 25 MG tablet Commonly known as: TENORMIN Take 0.5 tablets (12.5 mg  total) by mouth daily as needed. Taking 1/2 tab daily   azelastine 0.1 % nasal spray Commonly known as: ASTELIN Place 1 spray into both nostrils 2 (two) times daily as needed for rhinitis.   benzonatate 100 MG capsule Commonly known as: TESSALON Take 1 capsule (100 mg total) by mouth 2 (two) times daily as needed for cough.   budesonide 180 MCG/ACT inhaler Commonly known as: PULMICORT Inhale 2 puffs into the lungs in the morning and at bedtime.   cholecalciferol 1000 units tablet Commonly known as: VITAMIN D Take by mouth Nightly.   diphenhydrAMINE 25 MG tablet Commonly known as: BENADRYL Take 1 tablet (25 mg total) by mouth every 6 (six) hours as needed.   EpiPen 2-Pak 0.3 mg/0.3 mL Soaj injection Generic drug: EPINEPHrine INJECT INTO THE MUSCLE ONCE FOR ALLERGICREACTION   estradiol 0.1 MG/GM vaginal cream Commonly known as: ESTRACE Place vaginally.   levalbuterol 45 MCG/ACT inhaler Commonly known as: XOPENEX HFA INHALE 2 PUFFS BY MOUTH EVERY 4 HOURS AS NEEDED FOR WHEEZING   MAGNESIUM PO Take by mouth as needed.   methylphenidate 10 MG tablet Commonly known as: Ritalin Take 1 tablet (10 mg total) by mouth 3 (three) times daily with meals.   Pamabrom 50 MG Caps Take 1 capsule by mouth daily as needed.   Primatene Mist 0.125 MG/ACT Aero Generic drug: EPINEPHrine Inhale into the lungs.   rosuvastatin 10 MG tablet Commonly known as: CRESTOR Take 1 tablet (10 mg total) by mouth daily.   SUMAtriptan 50 MG tablet Commonly known as: IMITREX Take 1 tablet (50 mg total) by mouth every 2 (two) hours as needed for migraine.   triamcinolone 55 MCG/ACT Aero nasal inhaler Commonly known as: NASACORT spray 1 spray each nostril 1-2 times per day   [place] 81 MG Caps Generic drug: Aspirin Take by mouth.    Past Medical History:  Diagnosis Date   ADD (attention deficit disorder)    Allergy    Anaphylaxis    Arthritis    Asthma    Chronic back pain    Chronic  neck pain    Endometriosis    Fibromyalgia    GERD (gastroesophageal reflux disease)    Hernia    Hyperlipidemia    Hypertension    IBS (irritable bowel syndrome)    IC (interstitial cystitis)    Low blood potassium    Multiple allergies    OCD (obsessive compulsive disorder)    Raynaud disease    Vertigo     Past Surgical History:  Procedure Laterality Date   BLADDER SURGERY     age 37- 81   CERVICAL FUSION     08/1998, 03/10/2017   CESAREAN SECTION     COLONOSCOPY     HAND SURGERY     2012, 2013  LAPAROSCOPIC ENDOMETRIOSIS FULGURATION Right    LESION REMOVAL  08/23/2012   Procedure: LESION REMOVAL HAND;  Surgeon: Cammie Sickle., MD;  Location: [facility] [facility];  Service: Orthopedics;  Laterality: Left;   ROTATOR CUFF REPAIR     UPPER GASTROINTESTINAL ENDOSCOPY      Objective:   Vitals:   06/08/22 1131  BP: 102/68  Pulse: 70  Resp: 12  Temp: 98.6 F (37 C)  SpO2: 98%          Physical Exam Constitutional:      Appearance: She is not diaphoretic.  HENT:     Head: Normocephalic.     Comments: + cobblestoning    Right Ear: Tympanic membrane, ear canal and external ear normal.     Left Ear: Tympanic membrane, ear canal and external ear normal.     Nose: Nose normal. No mucosal edema or rhinorrhea.     Mouth/Throat:     Pharynx: Uvula midline. No oropharyngeal exudate.  Eyes:     Conjunctiva/sclera: Conjunctivae normal.  Neck:     Thyroid: No thyromegaly.     Trachea: Trachea normal. No tracheal tenderness or tracheal deviation.  Cardiovascular:     Rate and Rhythm: Normal rate and regular rhythm.     Heart sounds: Normal heart sounds, S1 normal and S2 normal. No murmur heard. Pulmonary:     Effort: No respiratory distress.     Breath sounds: Normal breath sounds. No stridor. No wheezing or rales.  Skin:    Findings: No erythema or rash.     Nails: There is no clubbing.  Neurological:     Mental Status: She is alert.      Diagnostics:   Reviewed prior and recent spirometry without any obstruction.   Assessment and Plan:   1. Severe persistent asthma without complication   2. Gastroesophageal reflux disease, unspecified whether esophagitis present   3. Chronic idiopathic urticaria   4. Flushing   5. Vocal cord dysfunction   6. Not well controlled severe persistent asthma   7. Chronic cough      1. Continue treatment of asthma:    A. Pulmicort 160 - 2 inhalations 2 times per day. Do not forget to take this twice daily.   2. Continue treatment of allergic rhinitis:   A. Add nasal rinses before.             B. OTC Nasacort - 1 spray each nostril 1-2 times per day             C. Azelastine nasal spray - 1 spray each nostril 1-2 times per day   3. Treat reflux:               A. Gaviscon as needed.   3. If needed:   A. Antihistamine B. Auvi-Q 0.3 / Epi-Pen C. Levalbuterol / albuterol E. Tessalon pearls 100 mg -1 tablet every 8 hours if needed for cough F. Vocal cord exercises as before  4. Return to clinic with Dr. Neldon Mc as scheduled.  5. Evaluation with [facility] All[facility] department as discussed.  Unclear etiology for her symptoms but believe that vocal cord dysfunction and post nasal drainage is contributing to it based on her description.   Previous spirometry in office have not shown persistent pattern of obstruction. I did discuss a few exercises with her to try today at the onset of symptoms for VCD.  She has an upcoming appointment with [facility] and I informed her to keep  this for further evaluation and second opinion.  She only needs refills on Tessalon pearls at this time.   Harlon Flor, MD Allergy / Immunology [place]

## 2022-06-08 NOTE — Patient Instructions (Addendum)
  1. Continue treatment of asthma:    A. Pulmicort 160 - 2 inhalations 2 times per day. Do not forget to take this twice daily.   2. Continue treatment of allergic rhinitis:   A. Use nasal rinses before.             B. OTC Nasacort - 1 spray each nostril 1-2 times per day             C. Azelastine nasal spray - 1 spray each nostril 1-2 times per day   3. Treat reflux:               A. Gaviscon as needed.   3. If needed:   A. Antihistamine B. Auvi-Q 0.3 / Epi-Pen C. Levalbuterol / albuterol E. Tessalon pearls 100 mg -1 tablet every 8 hours if needed for cough F. Vocal cord exercises as before     Vocal Cord Dysfunction:  PAUSED BREATHING: Sit in a position that allows your neck & shoulders to relax but keep your back straight. Breathe in gently through the nose. Stick your tongue out of your mouth, past the teeth & lower lip, in preparation to exhale. This forward stretch of the tongue helps to open the airway at the vocal cords. This may be difficult to do with a severe spasm but will be easier the more you repeat this exercise. With the tongue out, exhale only through the mouth in slow, paused or spaced breaths. The timing should be like saying Yevonne Pax, Ha, Ha, very slowly. Don't use your voice, just breathe out. Repeat 10 times and practice 3 times a day so you will know how to do it well when VCD occurs.  BELLY BREATHING: Sit in a position that allows your neck and shoulders to relax but keep your back straight. Place your hand on your belly. Breathe in gently through the nose with your belly pushing your hand outward from your body. As you start to exhale, place the tip of your tongue where your upper teeth meet the roof of your mouth. This will allow you to make a hissing or "S" sound as you exhale. This creates a back pressure to help keep the airway open. Slowly exhale allowing the hand & belly to move inward to a resting position and make the hissing or "S" sound as you  push the air between your tongue & teeth. Repeat 10 times & practice 3 times a day so you will know how to do it well when VCD occurs.

## 2022-06-21 ENCOUNTER — Other Ambulatory Visit: Payer: Self-pay | Admitting: Family Medicine

## 2022-06-21 DIAGNOSIS — F908 Attention-deficit hyperactivity disorder, other type: Secondary | ICD-10-CM

## 2022-06-21 DIAGNOSIS — G43909 Migraine, unspecified, not intractable, without status migrainosus: Secondary | ICD-10-CM

## 2022-06-21 DIAGNOSIS — F4001 Agoraphobia with panic disorder: Secondary | ICD-10-CM

## 2022-06-21 MED ORDER — METHYLPHENIDATE HCL 10 MG PO TABS: 10.0000 mg | ORAL_TABLET | Freq: Three times a day (TID) | ORAL | 0 refills | Status: DC

## 2022-06-21 NOTE — Telephone Encounter (Signed)
Refill request for  Ritalin 10 mg LR 05/13/22, #30, 0 rf LOV 02/25/22 FOV 07/12/22  Please review and advise.  Thanks.  Dm/cma

## 2022-07-08 LAB — HM PAP SMEAR

## 2022-07-12 ENCOUNTER — Encounter: Payer: Self-pay | Admitting: Family Medicine

## 2022-07-12 ENCOUNTER — Ambulatory Visit: Payer: No Typology Code available for payment source | Admitting: Family Medicine

## 2022-07-12 VITALS — BP 122/84 | HR 72 | Temp 97.2°F | Ht 63.0 in | Wt 164.0 lb

## 2022-07-12 DIAGNOSIS — J4540 Moderate persistent asthma, uncomplicated: Secondary | ICD-10-CM

## 2022-07-12 DIAGNOSIS — R531 Weakness: Secondary | ICD-10-CM | POA: Diagnosis not present

## 2022-07-12 DIAGNOSIS — I10 Essential (primary) hypertension: Secondary | ICD-10-CM

## 2022-07-12 DIAGNOSIS — Z13820 Encounter for screening for osteoporosis: Secondary | ICD-10-CM | POA: Diagnosis not present

## 2022-07-12 DIAGNOSIS — G43909 Migraine, unspecified, not intractable, without status migrainosus: Secondary | ICD-10-CM

## 2022-07-12 MED ORDER — SUMATRIPTAN SUCCINATE 50 MG PO TABS: ORAL_TABLET | ORAL | 5 refills | Status: DC

## 2022-07-12 NOTE — Progress Notes (Signed)
[facility] [place] [address] Dept: [phone] Dept Fax: [phone]  Chronic Care Office Visit  Subjective:    Patient ID: Samantha [name], female    DOB: 12/14/1970, 51 y.o..   MRN: [id number]  Chief Complaint  Patient presents with   Follow-up    2 month f/u.      History of Present Illness:  Patient is in today for reassessment of chronic medical issues.  Samantha [name] has a history of hypertension and is managed on atenolol 25 mg 1/2 tab daily.   Samantha [name] has a history of asthma and allergy symptoms. She is followed by Dr. Gaylyn Rong (allergist). He has had concerns for ongoing respiratory inflammation and is trying to get Samantha [name] in with [facility] for further evaluation, however, this has not happened yet. She did recently see Dr. Posey Pronto who recommended she add in nasal saline washes.   Samantha [name] describes an event she had experienced about 2 weeks ago. She notes she was working at her desk. She had onset of vertigo (which she describes as feeling like a ship moving on water). She took a Valium, as has been her usual. However, the vertigo progressed to include an ice pick-like headache, nausea, and then profound weakness. She describes her family helping her into a chair and that she was drooling form her mouth and found it difficult to speak. This resolved over a period of about an hour. Samantha [name] has a history of migraine headaches, but has not experienced anything like this.  Past Medical History: Patient Active Problem List   Diagnosis Date Noted   Cervical spine arthritis 02/25/2022   Leg swelling 10/19/2021   Interstitial cystitis 09/15/2021   Other urethral stricture, female 09/15/2021   Chronic kidney disease, stage 3a ([place]) 08/20/2021   Prediabetes 08/27/2020   History of [place] spotted fever 02/04/2020   Moderate persistent asthma without complication 38/75/6433    LPRD (laryngopharyngeal reflux disease) 08/02/2019   Chronic rhinitis 08/02/2019   Chronic post-traumatic stress disorder (PTSD) 11/21/2018   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) 11/21/2018   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/2018   Pulmonary nodules 11/13/2018   Other microscopic hematuria 11/06/2018   Anxiety and depression 07/04/2018   Fecal incontinence 07/02/2018   Leg weakness 06/12/2017   Ataxic gait 02/07/2017   Left ear hearing loss 02/07/2017   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   Mixed hyperlipidemia 11/08/2016   Acute idiopathic gout of multiple sites 11/08/2016   Elbow pain, chronic, right 10/06/2016   Lateral epicondylitis, right elbow 10/06/2016   Fibromyalgia 10/04/2016   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   Migraine headache 07/29/2016   Irritable bowel syndrome with both constipation and diarrhea 07/29/2016   Hives 07/18/2016   Urinary incontinence 06/02/2016   Atherosclerosis of abdominal aorta ([place]) 09/03/2015   Carpal tunnel syndrome 11/19/2014   Primary hypersomnia 11/18/2014   Insomnia 11/18/2014   Lumbar radicular pain 11/18/2014   Chronic pain syndrome 11/18/2014   Cervical disc disorder with radiculopathy 11/18/2014   Chronic female pelvic pain 07/31/2014   Degeneration of intervertebral disc of lumbosacral region 07/31/2014   Rosacea 07/31/2014   Morton's neuroma 07/31/2014   Benign paroxysmal positional vertigo 07/31/2014   Essential hypertension 07/31/2014   History of methicillin resistant Staphylococcus aureus infection 07/09/2014   Past Surgical History:  Procedure Laterality Date   BLADDER SURGERY     age 33- 76   CERVICAL FUSION  08/1998, 03/10/2017   CESAREAN SECTION     COLONOSCOPY     HAND SURGERY     2012, 2013   LAPAROSCOPIC ENDOMETRIOSIS FULGURATION Right    LESION REMOVAL  08/23/2012   Procedure: LESION REMOVAL HAND;  Surgeon: Cammie Sickle., MD;  Location:  [facility];  Service: Orthopedics;  Laterality: Left;   ROTATOR CUFF REPAIR     UPPER GASTROINTESTINAL ENDOSCOPY     Family History  Problem Relation Age of Onset   ADD / ADHD Brother    Stomach cancer Maternal Grandfather    Colon cancer Neg Hx    Esophageal cancer Neg Hx    Rectal cancer Neg Hx    Outpatient Medications Prior to Visit  Medication Sig Dispense Refill   albuterol (VENTOLIN HFA) 108 (90 Base) MCG/ACT inhaler Inhale 2 puffs into the lungs every 4 (four) hours as needed for wheezing or shortness of breath. 18 g 1   ALPRAZolam (XANAX) 1 MG tablet Take 1 tablet by mouth twice daily as needed 20 tablet 2   Aspirin (VAZALORE) 81 MG CAPS Take by mouth.     atenolol (TENORMIN) 25 MG tablet Take 0.5 tablets (12.5 mg total) by mouth daily as needed. Taking 1/2 tab daily 45 tablet 3   azelastine (ASTELIN) 0.1 % nasal spray Place 1 spray into both nostrils 2 (two) times daily as needed for rhinitis. 30 mL 5   benzonatate (TESSALON) 100 MG capsule Take 1 capsule (100 mg total) by mouth 3 (three) times daily as needed for cough. 90 capsule 0   budesonide (PULMICORT) 180 MCG/ACT inhaler Inhale 2 puffs into the lungs in the morning and at bedtime. 1 each 5   cholecalciferol (VITAMIN D) 1000 UNITS tablet Take by mouth Nightly.     diazepam (VALIUM) 10 MG tablet Take 1 tablet (10 mg total) by mouth daily as needed for anxiety. 30 tablet 1   diphenhydrAMINE (BENADRYL) 25 MG tablet Take 1 tablet (25 mg total) by mouth every 6 (six) hours as needed. 30 tablet 1   EPINEPHrine (AUVI-Q) 0.3 mg/0.3 mL IJ SOAJ injection Inject 0.3 mg into the muscle as needed for anaphylaxis. As directed for life-threatening allergic reactions 2 each 1   EPINEPHrine (PRIMATENE MIST) 0.125 MG/ACT AERO Inhale into the lungs.     EPIPEN 2-PAK 0.3 MG/0.3ML SOAJ injection INJECT INTO THE MUSCLE ONCE FOR ALLERGICREACTION 2 each 1   estradiol (ESTRACE) 0.1 MG/GM vaginal cream Place vaginally.      levalbuterol (XOPENEX HFA) 45 MCG/ACT inhaler INHALE 2 PUFFS BY MOUTH EVERY 4 HOURS AS NEEDED FOR WHEEZING 15 g 1   MAGNESIUM PO Take by mouth as needed.     methylphenidate (RITALIN) 10 MG tablet Take 1 tablet (10 mg total) by mouth 3 (three) times daily with meals. 90 tablet 0   Pamabrom 50 MG CAPS Take 1 capsule by mouth daily as needed.     rosuvastatin (CRESTOR) 10 MG tablet Take 1 tablet (10 mg total) by mouth daily. 90 tablet 3   triamcinolone (NASACORT) 55 MCG/ACT AERO nasal inhaler spray 1 spray each nostril 1-2 times per day 16.5 g 5   SUMAtriptan (IMITREX) 50 MG tablet TAKE 1 TABLET BY MOUTH EVERY 2 HOURS AS NEEDED FOR MIGRAINE HEADACHE 10 tablet 5   No facility-administered medications prior to visit.   Allergies  Allergen Reactions   Bactrim Anaphylaxis   Bee Venom Hives, Itching, Other (See Comments) and Rash   Chocolate Flavor Anaphylaxis  Clindamycin Shortness Of Breath   Coconut (Cocos Nucifera) Other (See Comments), Hives and Itching   Cymbalta [Duloxetine Hcl] Shortness Of Breath   Other Hives, Other (See Comments), Rash and Itching    Insect stings Wine spirit   Peanut-Containing Drug Products Swelling, Other (See Comments), Itching, Shortness Of Breath and Hives   Potassium-Containing Compounds Anaphylaxis, Itching and Swelling   Ramipril Anaphylaxis   Sulfamethoxazole-Trimethoprim Anaphylaxis   Aspartame Other (See Comments)   Bacitra-Neomycin-Polymyxin-Hc Rash   Cranberry Extract Other (See Comments)   Elemental Sulfur Itching   Sulfur Itching and Other (See Comments)   Allegra [Fexofenadine]     She reports tachycardia and elevated BP and low Oxygen level after taking this   Amoxicillin    Benadryl [Diphenhydramine]     Only the liquid form she can not take  because it causes throat closing    Chocolate    Ciprofloxacin    Demeclocycline    Dexlansoprazole    Doxycycline     Possibly joint pain or itch    Erythromycin     Upset stomach    Erythromycin Base    Esomeprazole    Hydrocodone Itching   Hydrocodone Bit-Homatrop Mbr     Not true allergy. "irritated"   Hydrocodone Bit-Homatrop Mbr    Hydromorphone Nausea Only   Levofloxacin     Weakness, myalgia   Levofloxacin    Lorazepam Other (See Comments)    Hallucination   Meloxicam Itching   Naproxen Itching   Naproxen Sodium Itching   Omeprazole-Sodium Bicarbonate     Chest pain-sore all over-HA   Ranitidine     Severe joint pain   Rizatriptan Other (See Comments)   Robaxin [Methocarbamol]     Made her very tired and unable to sleep at night   Tape Other (See Comments)   Tetracyclines & Related    Tezspire [Tezepelumab] Other (See Comments)   Tizanidine     "knocked her out"- had a rebound migraine   Trazodone    Triamcinolone    Acetaminophen Rash   Etodolac Rash   Neosporin [Neomycin-Bacitracin Zn-Polymyx] Rash     Objective:   Today's Vitals   07/12/22 1030  BP: 122/84  Pulse: 72  Temp: (!) 97.2 F (36.2 C)  TempSrc: Temporal  SpO2: 98%  Weight: 164 lb (74.4 kg)  Height: '5\' 3"'$  (1.6 m)   Body mass index is 29.05 kg/m.   General: Well developed, well nourished. No acute distress. Lungs: Clear to auscultation bilaterally. No wheezing, rales or rhonchi. Neuro: CN II-XII intact. Equal grip strength bilaterally. Psych: Alert and oriented. Normal mood and affect.  Health Maintenance Due  Topic Date Due   HIV Screening  Never done   Hepatitis C Screening  Never done   Zoster Vaccines- Shingrix (1 of 2) Never done   PAP SMEAR-Modifier  Never done     Assessment & Plan:   1. Essential hypertension Blood pressure is at goal. Continue atenolol 12.5 mg daily  2. Moderate persistent asthma without complication Continue to work with allergist. Well controlled on budesonide and PRN SABAs.  3. Screening for osteoporosis Samantha [name] has required multiple courses of steroids over the years. I will check a bone density to make sure she is not  developing a secondary osteoporosis.  - DG Bone Density; Future  4. Acute weakness The recent episode does sound potentially neurologic. She has a strong family history of TIAs. However, this could have also been an atypical migraine. I will  refer her to neurology for further assessment.  - Ambulatory referral to Neurology  5. Migraine without status migrainosus, not intractable, unspecified migraine type Continue Imitrex.  - SUMAtriptan (IMITREX) 50 MG tablet; TAKE 1 TABLET BY MOUTH EVERY 2 HOURS AS NEEDED FOR MIGRAINE HEADACHE  Dispense: 10 tablet; Refill: 5   Return in about 2 months (around 09/11/2022) for Reassessment.   Haydee Salter, MD

## 2022-07-21 ENCOUNTER — Other Ambulatory Visit: Payer: Self-pay | Admitting: Family Medicine

## 2022-07-21 ENCOUNTER — Other Ambulatory Visit: Payer: Self-pay | Admitting: Allergy and Immunology

## 2022-07-21 DIAGNOSIS — F32A Depression, unspecified: Secondary | ICD-10-CM

## 2022-07-21 DIAGNOSIS — F908 Attention-deficit hyperactivity disorder, other type: Secondary | ICD-10-CM

## 2022-07-21 NOTE — Telephone Encounter (Signed)
Refill request for  Ritalin 10 mg LR 06/21/22, #90, 0 rf LOV 07/12/22 FOV 09/28/22   Please review and advise.  Thanks. Dm/cma

## 2022-07-22 MED ORDER — METHYLPHENIDATE HCL 10 MG PO TABS: 10.0000 mg | ORAL_TABLET | Freq: Three times a day (TID) | ORAL | 0 refills | Status: DC

## 2022-07-28 ENCOUNTER — Other Ambulatory Visit: Payer: Self-pay

## 2022-07-28 ENCOUNTER — Ambulatory Visit (HOSPITAL_BASED_OUTPATIENT_CLINIC_OR_DEPARTMENT_OTHER)
Admission: RE | Admit: 2022-07-28 | Discharge: 2022-07-28 | Disposition: A | Discharge status: Home / Self Care | Payer: No Typology Code available for payment source | Source: Ambulatory Visit | Attending: Family Medicine | Admitting: Family Medicine

## 2022-07-28 DIAGNOSIS — Z13820 Encounter for screening for osteoporosis: Secondary | ICD-10-CM | POA: Insufficient documentation

## 2022-07-28 MED ORDER — AUVI-Q 0.3 MG/0.3ML IJ SOAJ: 0.3000 mg | INTRAMUSCULAR | 1 refills | Status: DC | PRN

## 2022-08-06 ENCOUNTER — Other Ambulatory Visit: Payer: Self-pay | Admitting: Family Medicine

## 2022-08-06 DIAGNOSIS — G43909 Migraine, unspecified, not intractable, without status migrainosus: Secondary | ICD-10-CM

## 2022-08-10 ENCOUNTER — Other Ambulatory Visit: Payer: Self-pay | Admitting: *Deleted

## 2022-08-10 MED ORDER — AUVI-Q 0.3 MG/0.3ML IJ SOAJ: 0.3000 mg | INTRAMUSCULAR | 1 refills | Status: DC | PRN

## 2022-08-12 ENCOUNTER — Telehealth: Payer: No Typology Code available for payment source | Admitting: Emergency Medicine

## 2022-08-12 DIAGNOSIS — L509 Urticaria, unspecified: Secondary | ICD-10-CM

## 2022-08-12 MED ORDER — PREDNISONE 10 MG (21) PO TBPK: ORAL_TABLET | ORAL | 0 refills | Status: DC

## 2022-08-12 NOTE — Progress Notes (Signed)
Virtual Visit Consent   Samantha [name], you are scheduled for a virtual visit with a [facility] provider today. Just as with appointments in the office, your consent must be obtained to participate. Your consent will be active for this visit and any virtual visit you may have with one of our providers in the next 365 days. If you have a MyChart account, a copy of this consent can be sent to you electronically.  As this is a virtual visit, video technology does not allow for your provider to perform a traditional examination. This may limit your provider's ability to fully assess your condition. If your provider identifies any concerns that need to be evaluated in person or the need to arrange testing (such as labs, EKG, etc.), we will make arrangements to do so. Although advances in technology are sophisticated, we cannot ensure that it will always work on either your end or our end. If the connection with a video visit is poor, the visit may have to be switched to a telephone visit. With either a video or telephone visit, we are not always able to ensure that we have a secure connection.  By engaging in this virtual visit, you consent to the provision of healthcare and authorize for your insurance to be billed (if applicable) for the services provided during this visit. Depending on your insurance coverage, you may receive a charge related to this service.  I need to obtain your verbal consent now. Are you willing to proceed with your visit today? Samantha [name] has provided verbal consent on 08/12/2022 for a virtual visit (video or telephone). Samantha Getting, NP  Date: 08/12/2022 1:15 PM  Virtual Visit via Video Note   I, Samantha [name], connected with  Samantha [name]  ([id number], 1971/04/15) on 08/12/22 at 11:45 AM EST by a video-enabled telemedicine application and verified that I am speaking with the correct person using two identifiers.  Location: Patient: Virtual  Visit Location Patient: Home Provider: Virtual Visit Location Provider: Home Office   I discussed the limitations of evaluation and management by telemedicine and the availability of in person appointments. The patient expressed understanding and agreed to proceed.    History of Present Illness: Samantha [name] is a 51 y.o. who identifies as a female who was assigned female at birth, and is being seen today for multiple complaints. Has been sick with cold symptoms for the last week including sore throat, congestion, coughing, feeling tired. Also has hx of cold urticaria so severe that when she is exposed to cold, it can cause an anaphylactic response. Went outside 11/29 "bundled up like an eskimo" but still has had a strong reaction to the cold. She quickly developed a red rash and urticaria, dizziness, nausea, trouble breathing.  She has used Benadryl and her Primatene Mist.  Most of the severe symptoms have progressed, but she still has severe itching and urticaria.  Typically, when she has an episode like this, her symptoms should have resolved by now.  She wonders if they have not resolved because she also has an acute illness going on.  Denies shortness of breath or wheezing  HPI: HPI  Problems:  Patient Active Problem List   Diagnosis Date Noted   Cervical spine arthritis 02/25/2022   Leg swelling 10/19/2021   Interstitial cystitis 09/15/2021   Other urethral stricture, female 09/15/2021   Chronic kidney disease, stage 3a ([place]) 08/20/2021   Prediabetes 08/27/2020   History of [facility] spotted  fever 02/04/2020   Moderate persistent asthma without complication 81/82/9937   LPRD (laryngopharyngeal reflux disease) 08/02/2019   Chronic rhinitis 08/02/2019   Chronic post-traumatic stress disorder (PTSD) 11/21/2018   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) 11/21/2018   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/2018   Pulmonary nodules 11/13/2018   Other  microscopic hematuria 11/06/2018   Anxiety and depression 07/04/2018   Fecal incontinence 07/02/2018   Leg weakness 06/12/2017   Ataxic gait 02/07/2017   Left ear hearing loss 02/07/2017   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   Mixed hyperlipidemia 11/08/2016   Acute idiopathic gout of multiple sites 11/08/2016   Elbow pain, chronic, right 10/06/2016   Lateral epicondylitis, right elbow 10/06/2016   Fibromyalgia 10/04/2016   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   Migraine headache 07/29/2016   Irritable bowel syndrome with both constipation and diarrhea 07/29/2016   Hives 07/18/2016   Urinary incontinence 06/02/2016   Atherosclerosis of abdominal aorta ([place]) 09/03/2015   Carpal tunnel syndrome 11/19/2014   Primary hypersomnia 11/18/2014   Insomnia 11/18/2014   Lumbar radicular pain 11/18/2014   Chronic pain syndrome 11/18/2014   Cervical disc disorder with radiculopathy 11/18/2014   Chronic female pelvic pain 07/31/2014   Degeneration of intervertebral disc of lumbosacral region 07/31/2014   Rosacea 07/31/2014   Morton's neuroma 07/31/2014   Benign paroxysmal positional vertigo 07/31/2014   Essential hypertension 07/31/2014   History of methicillin resistant Staphylococcus aureus infection 07/09/2014    Allergies:  Allergies  Allergen Reactions   Bactrim Anaphylaxis   Bee Venom Hives, Itching, Other (See Comments) and Rash   Chocolate Flavor Anaphylaxis   Clindamycin Shortness Of Breath   Coconut (Cocos Nucifera) Other (See Comments), Hives and Itching   Cymbalta [Duloxetine Hcl] Shortness Of Breath   Other Hives, Other (See Comments), Rash and Itching    Insect stings Wine spirit   Peanut-Containing Drug Products Swelling, Other (See Comments), Itching, Shortness Of Breath and Hives   Potassium-Containing Compounds Anaphylaxis, Itching and Swelling   Ramipril Anaphylaxis   Sulfamethoxazole-Trimethoprim Anaphylaxis   Aspartame Other (See  Comments)   Bacitra-Neomycin-Polymyxin-Hc Rash   Cranberry Extract Other (See Comments)   Elemental Sulfur Itching   Sulfur Itching and Other (See Comments)   Allegra [Fexofenadine]     She reports tachycardia and elevated BP and low Oxygen level after taking this   Amoxicillin    Benadryl [Diphenhydramine]     Only the liquid form she can not take  because it causes throat closing    Chocolate    Ciprofloxacin    Demeclocycline    Dexlansoprazole    Doxycycline     Possibly joint pain or itch    Erythromycin     Upset stomach   Erythromycin Base    Esomeprazole    Hydrocodone Itching   Hydrocodone Bit-Homatrop Mbr     Not true allergy. "irritated"   Hydrocodone Bit-Homatrop Mbr    Hydromorphone Nausea Only   Levofloxacin     Weakness, myalgia   Levofloxacin    Lorazepam Other (See Comments)    Hallucination   Meloxicam Itching   Naproxen Itching   Naproxen Sodium Itching   Omeprazole-Sodium Bicarbonate     Chest pain-sore all over-HA   Ranitidine     Severe joint pain   Rizatriptan Other (See Comments)   Robaxin [Methocarbamol]     Made her very tired and unable to sleep at night   Tape Other (See Comments)   Tetracyclines & Related  Tezspire [Tezepelumab] Other (See Comments)   Tizanidine     "knocked her out"- had a rebound migraine   Trazodone    Triamcinolone    Acetaminophen Rash   Etodolac Rash   Neosporin [Neomycin-Bacitracin Zn-Polymyx] Rash   Medications:  Current Outpatient Medications:    predniSONE (STERAPRED UNI-PAK 21 TAB) 10 MG (21) TBPK tablet, Take 6 tabs on day 1; take 5 tabs day 2; take 4 tabs day 3; take 3 tabs day 4; take 2 tabs day 5; take 1 tab day 6, Disp: 21 tablet, Rfl: 0   albuterol (VENTOLIN HFA) 108 (90 Base) MCG/ACT inhaler, Inhale 2 puffs into the lungs every 4 (four) hours as needed for wheezing or shortness of breath., Disp: 18 g, Rfl: 1   ALPRAZolam (XANAX) 1 MG tablet, Take 1 tablet by mouth twice daily as needed, Disp: 20  tablet, Rfl: 2   Aspirin (VAZALORE) 81 MG CAPS, Take by mouth., Disp: , Rfl:    atenolol (TENORMIN) 25 MG tablet, Take 0.5 tablets (12.5 mg total) by mouth daily as needed. Taking 1/2 tab daily, Disp: 45 tablet, Rfl: 3   AUVI-Q 0.3 MG/0.3ML SOAJ injection, Inject 0.3 mg into the muscle as needed for anaphylaxis. As directed for life-threatening allergic reactions, Disp: 2 each, Rfl: 1   azelastine (ASTELIN) 0.1 % nasal spray, Place 1 spray into both nostrils 2 (two) times daily as needed for rhinitis., Disp: 30 mL, Rfl: 5   benzonatate (TESSALON) 100 MG capsule, Take 1 capsule (100 mg total) by mouth 3 (three) times daily as needed for cough., Disp: 90 capsule, Rfl: 0   budesonide (PULMICORT) 180 MCG/ACT inhaler, Inhale 2 puffs into the lungs in the morning and at bedtime., Disp: 1 each, Rfl: 5   cholecalciferol (VITAMIN D) 1000 UNITS tablet, Take by mouth Nightly., Disp: , Rfl:    diazepam (VALIUM) 10 MG tablet, TAKE 1 TABLET BY MOUTH ONCE DAILY AS NEEDED FOR ANXIETY, Disp: 30 tablet, Rfl: 2   diphenhydrAMINE (BENADRYL) 25 MG tablet, Take 1 tablet (25 mg total) by mouth every 6 (six) hours as needed., Disp: 30 tablet, Rfl: 1   EPINEPHrine (PRIMATENE MIST) 0.125 MG/ACT AERO, Inhale into the lungs., Disp: , Rfl:    EPIPEN 2-PAK 0.3 MG/0.3ML SOAJ injection, INJECT INTO THE MUSCLE ONCE FOR ALLERGICREACTION, Disp: 2 each, Rfl: 1   estradiol (ESTRACE) 0.1 MG/GM vaginal cream, Place vaginally., Disp: , Rfl:    levalbuterol (XOPENEX HFA) 45 MCG/ACT inhaler, INHALE 2 PUFFS BY MOUTH EVERY 4 HOURS AS NEEDED FOR WHEEZING, Disp: 15 g, Rfl: 1   MAGNESIUM PO, Take by mouth as needed., Disp: , Rfl:    methylphenidate (RITALIN) 10 MG tablet, Take 1 tablet (10 mg total) by mouth 3 (three) times daily with meals., Disp: 90 tablet, Rfl: 0   Pamabrom 50 MG CAPS, Take 1 capsule by mouth daily as needed., Disp: , Rfl:    rosuvastatin (CRESTOR) 10 MG tablet, Take 1 tablet (10 mg total) by mouth daily., Disp: 90 tablet,  Rfl: 3   SUMAtriptan (IMITREX) 50 MG tablet, TAKE 1 TABLET BY MOUTH EVERY 2 HOURS AS NEEDED FOR MIGRAINE HEADACHE, Disp: 10 tablet, Rfl: 3   triamcinolone (NASACORT) 55 MCG/ACT AERO nasal inhaler, spray 1 spray each nostril 1-2 times per day, Disp: 16.5 g, Rfl: 5  Observations/Objective: Patient is well-developed, well-nourished in no acute distress.  Resting comfortably  at home.  Head is normocephalic, atraumatic.  No labored breathing.  Speech is clear and coherent with logical  content.  Patient is alert and oriented at baseline.  Rash is not visible on video connection  Assessment and Plan: 1. Urticaria  Discussed with patient what she usually does to manage her problems in this scenario.  She reports she does well with a prednisone Dosepak.  She wonders if she could have strep and I suggested if she is worried about strep, she will be tested in person such as at an urgent care.  She will follow-up with her allergist or seek further care if this treatment plan with prednisone does not resolve her cold urticaria flare.   Follow Up Instructions: I discussed the assessment and treatment plan with the patient. The patient was provided an opportunity to ask questions and all were answered. The patient agreed with the plan and demonstrated an understanding of the instructions.  A copy of instructions were sent to the patient via MyChart unless otherwise noted below.   The patient was advised to call back or seek an in-person evaluation if the symptoms worsen or if the condition fails to improve as anticipated.  Time:  I spent 15 minutes with the patient via telehealth technology discussing the above problems/concerns.    Samantha Getting, NP

## 2022-08-12 NOTE — Patient Instructions (Signed)
Perla Aletha Halim, thank you for joining [name], NP for today's virtual visit.  While this provider is not your primary care provider (PCP), if your PCP is located in our provider database this encounter information will be shared with them immediately following your visit.   [facility] account gives you access to today's visit and all your visits, tests, and labs performed at [facility] " click here if you don't have a [facility] account or go to [email or web address]  Consent: (Patient) Samantha [name] provided verbal consent for this virtual visit at the beginning of the encounter.  Current Medications:  Current Outpatient Medications:    predniSONE (STERAPRED UNI-PAK 21 TAB) 10 MG (21) TBPK tablet, Take 6 tabs on day 1; take 5 tabs day 2; take 4 tabs day 3; take 3 tabs day 4; take 2 tabs day 5; take 1 tab day 6, Disp: 21 tablet, Rfl: 0   albuterol (VENTOLIN HFA) 108 (90 Base) MCG/ACT inhaler, Inhale 2 puffs into the lungs every 4 (four) hours as needed for wheezing or shortness of breath., Disp: 18 g, Rfl: 1   ALPRAZolam (XANAX) 1 MG tablet, Take 1 tablet by mouth twice daily as needed, Disp: 20 tablet, Rfl: 2   Aspirin (VAZALORE) 81 MG CAPS, Take by mouth., Disp: , Rfl:    atenolol (TENORMIN) 25 MG tablet, Take 0.5 tablets (12.5 mg total) by mouth daily as needed. Taking 1/2 tab daily, Disp: 45 tablet, Rfl: 3   AUVI-Q 0.3 MG/0.3ML SOAJ injection, Inject 0.3 mg into the muscle as needed for anaphylaxis. As directed for life-threatening allergic reactions, Disp: 2 each, Rfl: 1   azelastine (ASTELIN) 0.1 % nasal spray, Place 1 spray into both nostrils 2 (two) times daily as needed for rhinitis., Disp: 30 mL, Rfl: 5   benzonatate (TESSALON) 100 MG capsule, Take 1 capsule (100 mg total) by mouth 3 (three) times daily as needed for cough., Disp: 90 capsule, Rfl: 0   budesonide (PULMICORT) 180 MCG/ACT inhaler, Inhale 2 puffs into the  lungs in the morning and at bedtime., Disp: 1 each, Rfl: 5   cholecalciferol (VITAMIN D) 1000 UNITS tablet, Take by mouth Nightly., Disp: , Rfl:    diazepam (VALIUM) 10 MG tablet, TAKE 1 TABLET BY MOUTH ONCE DAILY AS NEEDED FOR ANXIETY, Disp: 30 tablet, Rfl: 2   diphenhydrAMINE (BENADRYL) 25 MG tablet, Take 1 tablet (25 mg total) by mouth every 6 (six) hours as needed., Disp: 30 tablet, Rfl: 1   EPINEPHrine (PRIMATENE MIST) 0.125 MG/ACT AERO, Inhale into the lungs., Disp: , Rfl:    EPIPEN 2-PAK 0.3 MG/0.3ML SOAJ injection, INJECT INTO THE MUSCLE ONCE FOR ALLERGICREACTION, Disp: 2 each, Rfl: 1   estradiol (ESTRACE) 0.1 MG/GM vaginal cream, Place vaginally., Disp: , Rfl:    levalbuterol (XOPENEX HFA) 45 MCG/ACT inhaler, INHALE 2 PUFFS BY MOUTH EVERY 4 HOURS AS NEEDED FOR WHEEZING, Disp: 15 g, Rfl: 1   MAGNESIUM PO, Take by mouth as needed., Disp: , Rfl:    methylphenidate (RITALIN) 10 MG tablet, Take 1 tablet (10 mg total) by mouth 3 (three) times daily with meals., Disp: 90 tablet, Rfl: 0   Pamabrom 50 MG CAPS, Take 1 capsule by mouth daily as needed., Disp: , Rfl:    rosuvastatin (CRESTOR) 10 MG tablet, Take 1 tablet (10 mg total) by mouth daily., Disp: 90 tablet, Rfl: 3   SUMAtriptan (IMITREX) 50 MG tablet, TAKE 1 TABLET BY MOUTH EVERY 2 HOURS AS NEEDED  FOR MIGRAINE HEADACHE, Disp: 10 tablet, Rfl: 3   triamcinolone (NASACORT) 55 MCG/ACT AERO nasal inhaler, spray 1 spray each nostril 1-2 times per day, Disp: 16.5 g, Rfl: 5   Medications ordered in this encounter:  Meds ordered this encounter  Medications   predniSONE (STERAPRED UNI-PAK 21 TAB) 10 MG (21) TBPK tablet    Sig: Take 6 tabs on day 1; take 5 tabs day 2; take 4 tabs day 3; take 3 tabs day 4; take 2 tabs day 5; take 1 tab day 6    Dispense:  21 tablet    Refill:  0     *If you need refills on other medications prior to your next appointment, please contact your pharmacy*  Follow-Up: Call back or seek an in-person evaluation if the  symptoms worsen or if the condition fails to improve as anticipated.  [facility] [phone]  Other Instructions Please follow-up with your allergist if this treatment plan does not resolve your symptoms.  If you are worried about the possibility of having strep throat, try to get tested somewhere in person, such as at an urgent care or your primary care provider's office.   If you have been instructed to have an in-person evaluation today at a local Urgent Care facility, please use the link below. It will take you to a list of all of our available [facility], including address, phone number and hours of operation. Please do not delay care.  [facility]  If you or a family member do not have a primary care provider, use the link below to schedule a visit and establish care. When you choose a [facility] primary care physician or advanced practice provider, you gain a long-term partner in health. Find a Primary Care Provider  Learn more about [facility]'s in-office and virtual care options: [facility]

## 2022-08-21 ENCOUNTER — Other Ambulatory Visit: Payer: Self-pay | Admitting: Family Medicine

## 2022-08-21 DIAGNOSIS — I10 Essential (primary) hypertension: Secondary | ICD-10-CM

## 2022-08-22 ENCOUNTER — Other Ambulatory Visit: Payer: Self-pay | Admitting: Allergy and Immunology

## 2022-08-22 ENCOUNTER — Other Ambulatory Visit: Payer: Self-pay | Admitting: Family Medicine

## 2022-08-22 DIAGNOSIS — F908 Attention-deficit hyperactivity disorder, other type: Secondary | ICD-10-CM

## 2022-08-23 ENCOUNTER — Encounter: Payer: Self-pay | Admitting: Internal Medicine

## 2022-08-23 ENCOUNTER — Ambulatory Visit (INDEPENDENT_AMBULATORY_CARE_PROVIDER_SITE_OTHER): Payer: No Typology Code available for payment source | Admitting: Internal Medicine

## 2022-08-23 ENCOUNTER — Other Ambulatory Visit: Payer: Self-pay

## 2022-08-23 VITALS — BP 118/68 | HR 71 | Temp 97.5°F | Resp 20 | Ht 64.0 in | Wt 167.1 lb

## 2022-08-23 DIAGNOSIS — R053 Chronic cough: Secondary | ICD-10-CM | POA: Diagnosis not present

## 2022-08-23 DIAGNOSIS — J383 Other diseases of vocal cords: Secondary | ICD-10-CM | POA: Diagnosis not present

## 2022-08-23 DIAGNOSIS — J4550 Severe persistent asthma, uncomplicated: Secondary | ICD-10-CM | POA: Diagnosis not present

## 2022-08-23 DIAGNOSIS — L501 Idiopathic urticaria: Secondary | ICD-10-CM

## 2022-08-23 MED ORDER — EPINEPHRINE 0.3 MG/0.3ML IJ SOAJ: INTRAMUSCULAR | 2 refills | Status: DC

## 2022-08-23 MED ORDER — BENZONATATE 100 MG PO CAPS: 100.0000 mg | ORAL_CAPSULE | Freq: Three times a day (TID) | ORAL | 1 refills | Status: DC | PRN

## 2022-08-23 MED ORDER — BUDESONIDE 0.25 MG/2ML IN SUSP: RESPIRATORY_TRACT | 5 refills | Status: DC

## 2022-08-23 NOTE — Progress Notes (Signed)
[facility]   Follow-up Note  Referring Provider: Haydee Salter, MD Primary Provider: Haydee Salter, MD Date of Office Visit: 08/23/2022  Subjective:   Samantha [name] (DOB: Apr 29, 1971) is a 51 y.o. female who returns to the [facility] on 08/23/2022 for an acute visit.   HPI: Samantha [name] is followed for asthma, allergic rhinitis, LPR, vocal cord dysfunction, and a history of urticaria/flushing disorder with unexplained etiology.    Since last visit, she reports she has had further episodes of reactions.  Reports some asthma flare up prior to Thanksgiving required albuterol when she was exposed to the cold weather and with someone smoking at the gathering.  She is taking Pulmicort 127mg 2 puffs BID.  She did not require oral prednisone for this.  She has used Pulmicort nebulizers in the past with improvement in her asthma; she has tried many other inhalers in the past but did not tolerate them such as Symbicort/Qvar/Flovent.   Then the next day, she had some throat swelling and tightness after eating [place]  She also noticed some white spots in her mouth and used listerine to clean them.  Then the next day, she tried the [place] and had the same throat issues.  She does not like [place] and does not eat it either.    Then a few days later, she took her dog out and it was very cold. She then had puffiness of the eyes, choking, coughing, hives all over, throat closing.  She had to use her albuterol.  She was also used PRN benadryl without much relief. Then next day, she continued to have itching and hives.  She was given oral prednisone; she then had diarrhea/stomach pain with the prednisone.  She tried Pepcid which helped with itching but it caused some mild stomach pain.  She is able to only take benadryl; she has not tolerated any other anti histamines in the past.   She also has tried vocal cord exercises  without much relief.  She is scared to go outside because she doesn't know what is going to trigger her.   She has noticed tessalon perles do help with her cough. She also drinks honey tea to help with her cough.   Pertinent ROS as discussed in HPI.  Allergies as of 04/12/2022       Reactions   Bactrim Anaphylaxis   Bee Venom Hives   Chocolate Flavor Anaphylaxis   Clindamycin Shortness Of Breath   Coconut (cocos Nucifera) Other (See Comments), Hives, Itching   Cymbalta [duloxetine Hcl] Shortness Of Breath   Duloxetine Anaphylaxis   Other Hives, Other (See Comments), Rash, Itching   Insect stings Wine spirit   Peanut-containing Drug Products Swelling, Other (See Comments), Itching, Shortness Of Breath, Hives   Potassium-containing Compounds Anaphylaxis, Itching, Swelling   Ramipril Anaphylaxis   Sulfamethoxazole-trimethoprim Anaphylaxis   Aspartame Other (See Comments)   Cranberry Extract Other (See Comments)   Elemental Sulfur Itching   Sulfur Itching, Other (See Comments)   Adhesive [tape]    Amoxicillin    Ciprofloxacin    Demeclocycline    Dexlansoprazole    Doxycycline    Possibly joint pain or itch   Erythromycin    Upset stomach   Erythromycin Base    Esomeprazole    Hydrocodone Itching   Hydrocodone Bit-homatrop Mbr    Not true allergy. "irritated"   Hydrocodone Bit-homatrop Mbr    Hydromorphone Nausea  Only   Levofloxacin    Weakness, myalgia   Levofloxacin    Lorazepam Other (See Comments)   Hallucination   Meloxicam Itching   Naproxen Itching   Naproxen Sodium Itching   Omeprazole-sodium Bicarbonate    Chest pain-sore all over-HA   Ranitidine    Severe joint pain   Robaxin [methocarbamol]    Made her very tired and unable to sleep at night   Tetracyclines & Related    Tizanidine    "knocked her out"- had a rebound migraine   Trazodone    Triamcinolone    Acetaminophen Rash   Etodolac Rash   Neosporin [neomycin-bacitracin Zn-polymyx] Rash         Medication List    albuterol 108 (90 Base) MCG/ACT inhaler Commonly known as: VENTOLIN HFA Inhale 2 puffs into the lungs every 4 (four) hours as needed for wheezing or shortness of breath.   ALPRAZolam 1 MG tablet Commonly known as: XANAX Take 1 tablet (1 mg total) by mouth 2 (two) times daily as needed.   atenolol 25 MG tablet Commonly known as: TENORMIN Take 0.5 tablets (12.5 mg total) by mouth daily as needed. Taking 1/2 tab daily   azelastine 0.1 % nasal spray Commonly known as: ASTELIN Place 1 spray into both nostrils 2 (two) times daily as needed for rhinitis.   benzonatate 100 MG capsule Commonly known as: TESSALON Take 1 capsule (100 mg total) by mouth 2 (two) times daily as needed for cough.   budesonide 180 MCG/ACT inhaler Commonly known as: PULMICORT Inhale 2 puffs into the lungs in the morning and at bedtime.   cholecalciferol 1000 units tablet Commonly known as: VITAMIN D Take by mouth Nightly.   diphenhydrAMINE 25 MG tablet Commonly known as: BENADRYL Take 1 tablet (25 mg total) by mouth every 6 (six) hours as needed.   EpiPen 2-Pak 0.3 mg/0.3 mL Soaj injection Generic drug: EPINEPHrine INJECT INTO THE MUSCLE ONCE FOR ALLERGICREACTION   estradiol 0.1 MG/GM vaginal cream Commonly known as: ESTRACE Place vaginally.   levalbuterol 45 MCG/ACT inhaler Commonly known as: XOPENEX HFA INHALE 2 PUFFS BY MOUTH EVERY 4 HOURS AS NEEDED FOR WHEEZING   MAGNESIUM PO Take by mouth as needed.   methylphenidate 10 MG tablet Commonly known as: Ritalin Take 1 tablet (10 mg total) by mouth 3 (three) times daily with meals.   Pamabrom 50 MG Caps Take 1 capsule by mouth daily as needed.   Primatene Mist 0.125 MG/ACT Aero Generic drug: EPINEPHrine Inhale into the lungs.   rosuvastatin 10 MG tablet Commonly known as: CRESTOR Take 1 tablet (10 mg total) by mouth daily.   SUMAtriptan 50 MG tablet Commonly known as: IMITREX Take 1 tablet (50 mg total) by mouth  every 2 (two) hours as needed for migraine.   triamcinolone 55 MCG/ACT Aero nasal inhaler Commonly known as: NASACORT spray 1 spray each nostril 1-2 times per day   [place] 81 MG Caps Generic drug: Aspirin Take by mouth.    Past Medical History:  Diagnosis Date   ADD (attention deficit disorder)    Allergy    Anaphylaxis    Arthritis    Asthma    Chronic back pain    Chronic neck pain    Endometriosis    Fibromyalgia    GERD (gastroesophageal reflux disease)    Hernia    Hyperlipidemia    Hypertension    IBS (irritable bowel syndrome)    IC (interstitial cystitis)    Low blood potassium  Multiple allergies    OCD (obsessive compulsive disorder)    Raynaud disease    Vertigo     Past Surgical History:  Procedure Laterality Date   BLADDER SURGERY     age 43- 77   CERVICAL FUSION     08/1998, 03/10/2017   CESAREAN SECTION     COLONOSCOPY     HAND SURGERY     2012, 2013   LAPAROSCOPIC ENDOMETRIOSIS FULGURATION Right    LESION REMOVAL  08/23/2012   Procedure: LESION REMOVAL HAND;  Surgeon: Cammie Sickle., MD;  Location: [facility];  Service: Orthopedics;  Laterality: Left;   ROTATOR CUFF REPAIR     UPPER GASTROINTESTINAL ENDOSCOPY      Objective:   Vitals:   08/23/22 1616  BP: 118/68  Pulse: 71  Resp: 20  Temp: (!) 97.5 F (36.4 C)  SpO2: 92%   Height: '5\' 4"'$  (162.6 cm)  Weight: 167 lb 1.6 oz (75.8 kg)   Physical Exam Constitutional:      Appearance: She is not diaphoretic.  HENT:     Head: Normocephalic.     Right Ear: External ear normal.     Left Ear: External ear normal.     Nose: Nose normal. No mucosal edema or rhinorrhea.     Mouth/Throat:     Mouth: Mucous membranes are moist.     Pharynx: Uvula midline. No pharyngeal swelling, oropharyngeal exudate or posterior oropharyngeal erythema.  Eyes:     Conjunctiva/sclera: Conjunctivae normal.  Neck:     Thyroid: No thyromegaly.     Trachea: Trachea normal. No tracheal  tenderness or tracheal deviation.  Cardiovascular:     Rate and Rhythm: Normal rate and regular rhythm.     Heart sounds: Normal heart sounds, S1 normal and S2 normal. No murmur heard. Pulmonary:     Effort: No respiratory distress.     Breath sounds: Normal breath sounds. No wheezing or rales.  Skin:    Findings: No erythema or rash.     Nails: There is no clubbing.  Neurological:     Mental Status: She is alert.     Diagnostics:   Reviewed prior and recent spirometry without any obstruction.   Assessment and Plan:   1. Vocal cord dysfunction   2. Chronic cough   3. Severe persistent asthma without complication   4. Chronic idiopathic urticaria    1. Continue treatment of asthma:    A. Pulmicort 160 - 2 inhalations 2 times per day.   B. Okay to use Pulmicort nebulizer 0.'25mg'$  up to twice daily for up to 1 week.    2. Continue treatment of allergic rhinitis:   A. Add nasal rinses before.             B. OTC Nasacort - 1 spray each nostril 1-2 times per day             C. Azelastine nasal spray - 1 spray each nostril 1-2 times per day   3. Treat reflux:               A. Gaviscon as needed.   3. If needed:   A. Antihistamine benadryl as needed. Okay to add Pepcid '20mg'$  twice daily as needed.   B. Auvi-Q 0.3 / Epi-Pen C. Levalbuterol / albuterol E. Tessalon pearls 100 mg -1 tablet every 8 hours if needed for cough F.  Vocal cord exercises as before  4. Return to clinic with Dr. Neldon Mc as  scheduled in 10/11/2022.  5. Evaluation with [facility] Allergy department as discussed.   Unclear etiology for her symptoms/reactions but do believe vocal cord dysfunction is playing a role.   Previous spirometry in office have not shown persistent pattern of obstruction.  We will try to add Pulmicort nebulizer 0.'25mg'$  BID for asthma flare up or respiratory illness.  We will also add Pepcid '20mg'$  BID PRN to help with hives.  She has not tolerated other anti-histamines in the past so unable to  add anything except continue Benadryl.  She has an upcoming appointment with [facility] and I informed her to keep this for further evaluation and second opinion.    Harlon Flor, MD  [facility]

## 2022-08-23 NOTE — Patient Instructions (Addendum)
1. Continue treatment of asthma:    A. Pulmicort 160 - 2 inhalations 2 times per day.   B. Okay to use Pulmicort nebulizer 0.'25mg'$  up to twice daily for up to 1 week.    2. Continue treatment of allergic rhinitis:   A. Add nasal rinses before.             B. OTC Nasacort - 1 spray each nostril 1-2 times per day             C. Azelastine nasal spray - 1 spray each nostril 1-2 times per day   3. Treat reflux:               A. Gaviscon as needed.   3. If needed:   A. Antihistamine benadryl as needed. Okay to add Pepcid '20mg'$  twice daily as needed.   B. Auvi-Q 0.3 / Epi-Pen C. Levalbuterol / albuterol E. Tessalon pearls 100 mg -1 tablet every 8 hours if needed for cough F.  Vocal cord exercises as before  4. Return to clinic with Dr. Neldon Mc as scheduled in 10/11/2022.  5. Evaluation with [facility] Allergy department as discussed.

## 2022-08-23 NOTE — Telephone Encounter (Signed)
Refill request for  Ritalin 10 mg LR  07/22/22, #90, 0 rf LOV 07/12/22 FOV 09/28/22  Please review and advise.  Thanks. Dm/cma

## 2022-09-09 MED ORDER — METHYLPHENIDATE HCL 10 MG PO TABS: 10.0000 mg | ORAL_TABLET | Freq: Three times a day (TID) | ORAL | 0 refills | Status: DC

## 2022-09-11 ENCOUNTER — Other Ambulatory Visit: Payer: Self-pay | Admitting: Internal Medicine

## 2022-09-16 ENCOUNTER — Other Ambulatory Visit: Payer: Self-pay | Admitting: Allergy and Immunology

## 2022-09-16 ENCOUNTER — Other Ambulatory Visit: Payer: Self-pay | Admitting: Family Medicine

## 2022-09-16 DIAGNOSIS — F4001 Agoraphobia with panic disorder: Secondary | ICD-10-CM

## 2022-09-16 DIAGNOSIS — F908 Attention-deficit hyperactivity disorder, other type: Secondary | ICD-10-CM

## 2022-09-20 ENCOUNTER — Encounter: Payer: Self-pay | Admitting: Allergy and Immunology

## 2022-09-21 MED ORDER — AZELASTINE HCL 0.1 % NA SOLN: 1.0000 | Freq: Two times a day (BID) | NASAL | 5 refills | Status: DC | PRN

## 2022-09-21 MED ORDER — BUDESONIDE 0.5 MG/2ML IN SUSP: RESPIRATORY_TRACT | 2 refills | Status: DC

## 2022-09-21 NOTE — Telephone Encounter (Signed)
Per provider: Please change azelastine dose as requested.  Please change Pulmicort to budesonide 0.5 mg neb solution -  nebulize 1-2 times per day    Called patient - DOB/Pharmacy verified - advised of provider notation above.  Patient verbalize understanding, no further questions.  Sending medications to pharmacy.

## 2022-09-28 ENCOUNTER — Encounter: Payer: Self-pay | Admitting: Family Medicine

## 2022-09-28 ENCOUNTER — Ambulatory Visit: Payer: No Typology Code available for payment source | Admitting: Family Medicine

## 2022-09-28 VITALS — BP 134/82 | HR 87 | Temp 97.5°F | Ht 64.0 in | Wt 171.4 lb

## 2022-09-28 DIAGNOSIS — R7303 Prediabetes: Secondary | ICD-10-CM | POA: Diagnosis not present

## 2022-09-28 DIAGNOSIS — E639 Nutritional deficiency, unspecified: Secondary | ICD-10-CM

## 2022-09-28 DIAGNOSIS — N1831 Chronic kidney disease, stage 3a: Secondary | ICD-10-CM

## 2022-09-28 DIAGNOSIS — J4540 Moderate persistent asthma, uncomplicated: Secondary | ICD-10-CM | POA: Diagnosis not present

## 2022-09-28 DIAGNOSIS — I10 Essential (primary) hypertension: Secondary | ICD-10-CM | POA: Diagnosis not present

## 2022-09-28 DIAGNOSIS — Z1159 Encounter for screening for other viral diseases: Secondary | ICD-10-CM

## 2022-09-28 DIAGNOSIS — F908 Attention-deficit hyperactivity disorder, other type: Secondary | ICD-10-CM

## 2022-09-28 LAB — BASIC METABOLIC PANEL
BUN: 12 mg/dL (ref 6–23)
CO2: 33 mEq/L — ABNORMAL HIGH (ref 19–32)
Calcium: 9.5 mg/dL (ref 8.4–10.5)
Chloride: 103 mEq/L (ref 96–112)
Creatinine, Ser: 0.91 mg/dL (ref 0.40–1.20)
GFR: 73.04 mL/min (ref >60.00)
Glucose, Bld: 110 mg/dL — ABNORMAL HIGH (ref 70–99)
Potassium: 4.1 mEq/L (ref 3.5–5.1)
Sodium: 142 mEq/L (ref 135–145)

## 2022-09-28 LAB — MAGNESIUM: Magnesium: 2.2 mg/dL (ref 1.5–2.5)

## 2022-09-28 LAB — VITAMIN D 25 HYDROXY (VIT D DEFICIENCY, FRACTURES): VITD: 37.97 ng/mL (ref 30.00–100.00)

## 2022-09-28 LAB — HEMOGLOBIN A1C: Hgb A1c MFr Bld: 5.9 % (ref 4.6–6.5)

## 2022-09-28 LAB — VITAMIN B12: Vitamin B-12: 241 pg/mL (ref 211–911)

## 2022-09-28 NOTE — Progress Notes (Signed)
[facility] [address] [address] Dept: [phone] Dept Fax: [phone]  Chronic Care Office Visit  Subjective:    Patient ID: Samantha [name], female    DOB: 09/30/1970, 52 y.o..   MRN: [id number]  Chief Complaint  Patient presents with   Follow-up    2 month f/u.  Fasting today.  C/o nasal congestion, bloody nose, fever(last week). Has taken mucinex.      History of Present Illness:  Patient is in today for reassessment of chronic medical issues.  [name] has a history of hypertension and is managed on atenolol 25 mg 1/2 tab daily.   [name] has a history of asthma and allergy symptoms. She is followed by Dr. Gaylyn Rong (allergist). More recently, she has been seeing Dr. Posey Pronto, who has raised questions about whether [name] truly has asthma as a primary issue. Her PFTs have apparently not shown airway obstruction. However, she has had evidence for vocal cord dysfunction. Additionally, she has brought into question multiple of the food allergies [name] has believed she had, including chocolate, coconut, avocado, [place], peanuts and shellfish. She conducted specific IgE testing for these and all were negative. [name] has reintroduced chocolate at home without any problems and is happy to have this back in her diet. [name] has complained of recent sinus pressure and pain. She also had some recent nose bleeds.  [name] has multiple psychiatric issues including anxiety with depression, PTSD, panic disorder, and ADHD. She is managed on Valium for baseline control of her anxiety and management of insomnia, and Xanax for breakthrough panic issues or acute anxiety. She is on methylphenidate TID for management of her ADHD and daytime hypersomnia. She has been having trouble obtaining her 10 mg methyphenidate, so takes a half-tab at times. She notes without the medication her thoughts are quite  scattered and she has poor focus. Additionally, she has increased drowsiness mid afternoon.  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) 11/21/2018   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/2018   Pulmonary nodules 11/13/2018   Other microscopic hematuria 11/06/2018   Anxiety and depression 07/04/2018   Fecal incontinence 07/02/2018   Leg weakness 06/12/2017   Left ear hearing loss 02/07/2017   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   Borderline hyperlipidemia 11/08/2016   Acute idiopathic gout of multiple sites 11/08/2016   Elbow pain, chronic, right 10/06/2016   Lateral epicondylitis, right elbow 10/06/2016   Fibromyalgia 10/04/2016   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   Migraine headache 07/29/2016   Irritable bowel syndrome with both constipation and diarrhea 07/29/2016   Hives 07/18/2016   Urinary incontinence 06/02/2016   Atherosclerosis of abdominal aorta ([place]) 09/03/2015   Carpal tunnel syndrome 11/19/2014   Primary hypersomnia 11/18/2014   Insomnia 11/18/2014   Lumbar radicular pain 11/18/2014   Chronic pain syndrome 11/18/2014   Cervical disc disorder with radiculopathy 11/18/2014   Chronic female pelvic pain 07/31/2014   Degeneration  of intervertebral disc of lumbosacral region 07/31/2014   Rosacea 07/31/2014   Morton's neuroma 07/31/2014   Benign paroxysmal positional  vertigo 07/31/2014   Essential hypertension 07/31/2014   History of methicillin resistant Staphylococcus aureus infection 07/09/2014   Past Surgical History:  Procedure Laterality Date   BLADDER SURGERY     age 39- 70   CERVICAL FUSION     08/1998, 03/10/2017   CESAREAN SECTION     COLONOSCOPY     HAND SURGERY     2012, 2013   LAPAROSCOPIC ENDOMETRIOSIS FULGURATION Right    LESION REMOVAL  08/23/2012   Procedure: LESION REMOVAL HAND;  Surgeon: Cammie Sickle., MD;  Location: [facility];  Service: Orthopedics;  Laterality: Left;   ROTATOR CUFF REPAIR     UPPER GASTROINTESTINAL ENDOSCOPY     Family History  Problem Relation Age of Onset   ADD / ADHD Brother    Stomach cancer Maternal Grandfather    Colon cancer Neg Hx    Esophageal cancer Neg Hx    Rectal cancer Neg Hx    Outpatient Medications Prior to Visit  Medication Sig Dispense Refill   albuterol (VENTOLIN HFA) 108 (90 Base) MCG/ACT inhaler INHALE 2 PUFFS BY MOUTH EVERY 4 HOURS AS NEEDED FOR WHEEZING FOR SHORTNESS OF BREATH 18 g 0   ALPRAZolam (XANAX) 1 MG tablet Take 1 tablet by mouth twice daily as needed 20 tablet 2   Aspirin (VAZALORE) 81 MG CAPS Take by mouth.     atenolol (TENORMIN) 25 MG tablet TAKE 1 TABLET BY MOUTH ONCE DAILY AS NEEDED 90 tablet 3   AUVI-Q 0.3 MG/0.3ML SOAJ injection Inject 0.3 mg into the muscle as needed for anaphylaxis. As directed for life-threatening allergic reactions 2 each 1   benzonatate (TESSALON) 100 MG capsule Take 1 capsule (100 mg total) by mouth 3 (three) times daily as needed for cough. 90 capsule 1   budesonide (PULMICORT) 0.5 MG/2ML nebulizer solution Nebulize 1-2 times per day. 75 mL 2   budesonide (PULMICORT) 180 MCG/ACT inhaler Inhale 2 puffs into the lungs in the morning and at bedtime. 1 each 5   cetirizine (ZYRTEC ALLERGY) 10 MG tablet      cholecalciferol (VITAMIN D) 1000 UNITS tablet Take by mouth Nightly.     diazepam (VALIUM) 10 MG tablet TAKE 1 TABLET  BY MOUTH ONCE DAILY AS NEEDED FOR ANXIETY 30 tablet 2   EPINEPHrine (EPIPEN 2-PAK) 0.3 mg/0.3 mL IJ SOAJ injection INJECT CONTENTS OF 1 PEN AS NEEDED FOR ALLERGIC REACTION 2 each 2   EPINEPHrine (PRIMATENE MIST) 0.125 MG/ACT AERO Inhale into the lungs.     estradiol (ESTRACE) 0.1 MG/GM vaginal cream Place vaginally.     ipratropium (ATROVENT) 0.03 % nasal spray Place into the nose.     levalbuterol (XOPENEX HFA) 45 MCG/ACT inhaler INHALE 2 PUFFS BY MOUTH EVERY 4 HOURS AS NEEDED FOR WHEEZING 15 g 0   MAGNESIUM PO Take by mouth as needed.     methylphenidate (RITALIN) 10 MG tablet Take 1 tablet (10 mg total) by mouth 3 (three) times daily with meals. 90 tablet 0   rosuvastatin (CRESTOR) 10 MG tablet Take 1 tablet (10 mg total) by mouth daily. 90 tablet 3   SUMAtriptan (IMITREX) 50 MG tablet TAKE 1 TABLET BY MOUTH EVERY 2 HOURS AS NEEDED FOR MIGRAINE HEADACHE 10 tablet 3   azelastine (ASTELIN) 0.1 % nasal spray Place 1 spray into both nostrils 2 (two) times daily as needed for  rhinitis. 30 mL 5   diphenhydrAMINE (BENADRYL) 25 MG tablet Take 1 tablet (25 mg total) by mouth every 6 (six) hours as needed. 30 tablet 1   Pamabrom 50 MG CAPS Take 1 capsule by mouth daily as needed.     predniSONE (STERAPRED UNI-PAK 21 TAB) 10 MG (21) TBPK tablet Take 6 tabs on day 1; take 5 tabs day 2; take 4 tabs day 3; take 3 tabs day 4; take 2 tabs day 5; take 1 tab day 6 21 tablet 0   triamcinolone (NASACORT) 55 MCG/ACT AERO nasal inhaler spray 1 spray each nostril 1-2 times per day 16.5 g 5   No facility-administered medications prior to visit.   Allergies  Allergen Reactions   Bactrim Anaphylaxis   Bee Venom Hives, Itching, Other (See Comments) and Rash   Clindamycin Shortness Of Breath   Cymbalta [Duloxetine Hcl] Shortness Of Breath   Other Hives, Other (See Comments), Rash and Itching    Insect stings Wine spirit   Potassium-Containing Compounds Anaphylaxis, Itching and Swelling   Ramipril Anaphylaxis    Sulfamethoxazole-Trimethoprim Anaphylaxis   Aspartame Other (See Comments)   Bacitra-Neomycin-Polymyxin-Hc Rash   Cranberry Extract Other (See Comments)   Elemental Sulfur Itching   Sulfur Itching and Other (See Comments)   Allegra [Fexofenadine]     She reports tachycardia and elevated BP and low Oxygen level after taking this   Amoxicillin    Benadryl [Diphenhydramine]     Only the liquid form she can not take  because it causes throat closing    Ciprofloxacin    Demeclocycline    Dexlansoprazole    Doxycycline     Possibly joint pain or itch    Erythromycin     Upset stomach   Erythromycin Base    Esomeprazole    Hydrocodone Itching   Hydrocodone Bit-Homatrop Mbr     Not true allergy. "irritated"   Hydrocodone Bit-Homatrop Mbr    Hydromorphone Nausea Only   Levofloxacin     Weakness, myalgia   Levofloxacin    Lorazepam Other (See Comments)    Hallucination   Meloxicam Itching   Naproxen Itching   Naproxen Sodium Itching   Omeprazole-Sodium Bicarbonate     Chest pain-sore all over-HA   Ranitidine     Severe joint pain   Rizatriptan Other (See Comments)   Robaxin [Methocarbamol]     Made her very tired and unable to sleep at night   Tape Other (See Comments)   Tetracyclines & Related    Tezspire [Tezepelumab] Other (See Comments)   Tizanidine     "knocked her out"- had a rebound migraine   Trazodone    Triamcinolone    Acetaminophen Rash   Etodolac Rash   Neosporin [Neomycin-Bacitracin Zn-Polymyx] Rash   Objective:   Today's Vitals   09/28/22 0947  BP: 134/82  Pulse: 87  Temp: (!) 97.5 F (36.4 C)  TempSrc: Temporal  SpO2: 97%  Weight: 171 lb 6.4 oz (77.7 kg)  Height: '5\' 4"'$  (1.626 m)   Body mass index is 29.42 kg/m.   General: Well developed, well nourished. No acute distress. HEENT: Normocephalic, non-traumatic. Conjunctiva clear. Fundiscopic exam shows normal disc and   vasculature. Nose with mild congestion with clear scant rhinorrhea. There  is some mild surface bleeding   in the atrium of the nose. +/- sinus tenderness to percussion. Mucous membranes moist. Oropharynx   clear. Good dentition. Neck: Supple. No lymphadenopathy. No thyromegaly. Lungs: Clear to auscultation bilaterally. No wheezing,  rales or rhonchi. Psych: Alert and oriented. Normal mood and affect.  Health Maintenance Due  Topic Date Due   HIV Screening  Never done   Hepatitis C Screening  Never done   Zoster Vaccines- Shingrix (1 of 2) Never done   PAP SMEAR-Modifier  Never done     Assessment & Plan:   1. Essential hypertension Blood pressure is in acceptable control at present. Continue atenolol 25 mg daily. I will check on renal function today.  - Basic metabolic panel  2. Moderate persistent asthma without complication Lungs clear today. Dr. Posey Pronto is questioning whether [name] truly has asthma. She will continue to follow and is considering stopping Pulmicort.  3. Prediabetes I will recheck A1c and glucose today.  - Basic metabolic panel - Hemoglobin A1c  4. 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) Plan to continue methylphenidate 10 mg (when available).  5. Chronic kidney disease, stage 3a ([place]) I will reassess renal function today.  6. Nutritional deficiency [name] has had some past history of nutritional deficiencies. These were normal the last time we assessed. I will recheck today. If they remain normal, she may not need to continue to monitor these unless new symptoms develop.  - VITAMIN D 25 Hydroxy (Vit-D Deficiency, Fractures) - Vitamin B12 - Magnesium  7. Encounter for hepatitis C screening test for low risk patient  - HCV Ab w Reflex to Quant PCR   Return in about 2 months (around 11/27/2022) for Reassessment.   Haydee Salter, MD

## 2022-09-29 LAB — HCV AB W REFLEX TO QUANT PCR: HCV Ab: NONREACTIVE

## 2022-09-29 LAB — HCV INTERPRETATION

## 2022-10-04 ENCOUNTER — Encounter: Payer: Self-pay | Admitting: Family Medicine

## 2022-10-11 ENCOUNTER — Ambulatory Visit (INDEPENDENT_AMBULATORY_CARE_PROVIDER_SITE_OTHER): Payer: No Typology Code available for payment source | Admitting: Allergy and Immunology

## 2022-10-11 VITALS — BP 98/66 | HR 75 | Temp 98.2°F | Resp 16 | Ht 63.0 in | Wt 167.5 lb

## 2022-10-11 DIAGNOSIS — J3089 Other allergic rhinitis: Secondary | ICD-10-CM | POA: Diagnosis not present

## 2022-10-11 DIAGNOSIS — J383 Other diseases of vocal cords: Secondary | ICD-10-CM | POA: Diagnosis not present

## 2022-10-11 DIAGNOSIS — L501 Idiopathic urticaria: Secondary | ICD-10-CM | POA: Diagnosis not present

## 2022-10-11 DIAGNOSIS — J4520 Mild intermittent asthma, uncomplicated: Secondary | ICD-10-CM

## 2022-10-11 DIAGNOSIS — R232 Flushing: Secondary | ICD-10-CM

## 2022-10-11 DIAGNOSIS — K219 Gastro-esophageal reflux disease without esophagitis: Secondary | ICD-10-CM

## 2022-10-11 NOTE — Progress Notes (Unsigned)
[facility]   Follow-up Note  Referring Provider: Haydee Salter, MD Primary Provider: Haydee Salter, MD Date of Office Visit: 10/11/2022  Subjective:   Samantha [name] (DOB: [birth year]) is a 52 y.o. female who returns to the [facility] on 10/11/2022 in re-evaluation of the following:  HPI: Samantha [name] returns to this clinic in evaluation of asthma, allergic rhinitis, LPR, vocal cord dysfunction, and a history of urticaria/flushing disorder with unexplained etiology.  I last saw her in this clinic 12 April 2022.  During her last visit we forwarded her care to [facility] allergy department and with that initial evaluation they felt as though she did not have any significant amount of atopic disease or hyperresponsive bronchospastic disease or really any type of disease state that required much therapy.  They have given her nasal ipratropium and asked her to continue on Mucinex if needed and she thinks that that helped her upper airway to some degree although it is a little bit too drying.  She has eliminated her Pulmicort and has not had any problems with her breathing at this point.  She has not had to use any short acting bronchodilator.  Her reflux appears to be under pretty good control.  Once again she cannot really take much therapy for her reflux and she is using Gaviscon.  She can take Pepcid for short period in time but then it gives rise to a migraine.  She has not really been having any problems with her skin at this point in time.  She still continues to do her breathing exercises for her vocal cord dysfunction.  She has obtained a flu vaccine.  Allergies as of 10/11/2022       Reactions   Bactrim Anaphylaxis   Bee Venom Hives, Itching, Other (See Comments), Rash   Clindamycin Shortness Of Breath   Cymbalta [duloxetine Hcl] Shortness Of Breath   Other Hives, Other (See Comments), Rash, Itching   Insect  stings Wine spirit   Potassium-containing Compounds Anaphylaxis, Itching, Swelling   Ramipril Anaphylaxis   Sulfamethoxazole-trimethoprim Anaphylaxis   Aspartame Other (See Comments)   Bacitra-neomycin-polymyxin-hc Rash   Cranberry Extract Other (See Comments)   Elemental Sulfur Itching   Sulfur Itching, Other (See Comments)   Allegra [fexofenadine]    She reports tachycardia and elevated BP and low Oxygen level after taking this   Amoxicillin    Benadryl [diphenhydramine]    Only the liquid form she can not take  because it causes throat closing    Ciprofloxacin    Demeclocycline    Dexlansoprazole    Doxycycline    Possibly joint pain or itch   Erythromycin    Upset stomach   Erythromycin Base    Esomeprazole    Hydrocodone Itching   Hydrocodone Bit-homatrop Mbr    Not true allergy. "irritated"   Hydrocodone Bit-homatrop Mbr    Hydromorphone Nausea Only   Levofloxacin    Weakness, myalgia   Levofloxacin    Lorazepam Other (See Comments)   Hallucination   Meloxicam Itching   Naproxen Itching   Naproxen Sodium Itching   Omeprazole-sodium Bicarbonate    Chest pain-sore all over-HA   Ranitidine    Severe joint pain   Rizatriptan Other (See Comments)   Robaxin [methocarbamol]    Made her very tired and unable to sleep at night   Tape Other (See Comments)   Tetracyclines & Related    Tezspire [tezepelumab]  Other (See Comments)   Tizanidine    "knocked her out"- had a rebound migraine   Trazodone    Triamcinolone    Acetaminophen Rash   Etodolac Rash   Neosporin [neomycin-bacitracin Zn-polymyx] Rash        Medication List    albuterol 108 (90 Base) MCG/ACT inhaler Commonly known as: VENTOLIN HFA INHALE 2 PUFFS BY MOUTH EVERY 4 HOURS AS NEEDED FOR WHEEZING FOR SHORTNESS OF BREATH   ALPRAZolam 1 MG tablet Commonly known as: XANAX Take 1 tablet by mouth twice daily as needed   atenolol 25 MG tablet Commonly known as: TENORMIN TAKE 1 TABLET BY MOUTH ONCE  DAILY AS NEEDED   Auvi-Q 0.3 mg/0.3 mL Soaj injection Generic drug: EPINEPHrine Inject 0.3 mg into the muscle as needed for anaphylaxis. As directed for life-threatening allergic reactions   EPINEPHrine 0.3 mg/0.3 mL Soaj injection Commonly known as: EpiPen 2-Pak INJECT CONTENTS OF 1 PEN AS NEEDED FOR ALLERGIC REACTION   benzonatate 100 MG capsule Commonly known as: TESSALON Take 1 capsule (100 mg total) by mouth 3 (three) times daily as needed for cough.   budesonide 180 MCG/ACT inhaler Commonly known as: PULMICORT Inhale 2 puffs into the lungs in the morning and at bedtime.   cholecalciferol 1000 units tablet Commonly known as: VITAMIN D Take by mouth Nightly.   diazepam 10 MG tablet Commonly known as: VALIUM TAKE 1 TABLET BY MOUTH ONCE DAILY AS NEEDED FOR ANXIETY   estradiol 0.1 MG/GM vaginal cream Commonly known as: ESTRACE Place vaginally.   ipratropium 0.03 % nasal spray Commonly known as: ATROVENT Place into the nose.   levalbuterol 45 MCG/ACT inhaler Commonly known as: XOPENEX HFA INHALE 2 PUFFS BY MOUTH EVERY 4 HOURS AS NEEDED FOR WHEEZING   MAGNESIUM PO Take by mouth as needed.   methylphenidate 10 MG tablet Commonly known as: Ritalin Take 1 tablet (10 mg total) by mouth 3 (three) times daily with meals.   Pepcid AC Maximum Strength 20 MG tablet Generic drug: famotidine Take 20 mg by mouth daily.   Primatene Mist 0.125 MG/ACT Aero Generic drug: EPINEPHrine Inhale into the lungs.   rosuvastatin 10 MG tablet Commonly known as: CRESTOR Take 1 tablet (10 mg total) by mouth daily.   SUMAtriptan 50 MG tablet Commonly known as: IMITREX TAKE 1 TABLET BY MOUTH EVERY 2 HOURS AS NEEDED FOR MIGRAINE HEADACHE   [place] 81 MG Caps Generic drug: Aspirin Take by mouth.   ZyrTEC Allergy 10 MG tablet Generic drug: cetirizine    Past Medical History:  Diagnosis Date   ADD (attention deficit disorder)    Allergy    Anaphylaxis    Arthritis    Asthma     Chronic back pain    Chronic neck pain    Endometriosis    Fibromyalgia    GERD (gastroesophageal reflux disease)    Hernia    Hyperlipidemia    Hypertension    IBS (irritable bowel syndrome)    IC (interstitial cystitis)    Low blood potassium    Multiple allergies    OCD (obsessive compulsive disorder)    Raynaud disease    Vertigo     Past Surgical History:  Procedure Laterality Date   BLADDER SURGERY     age 42- 20   CERVICAL FUSION     08/1998, 03/10/2017   CESAREAN SECTION     COLONOSCOPY     HAND SURGERY     2012, 2013   LAPAROSCOPIC ENDOMETRIOSIS FULGURATION Right  LESION REMOVAL  08/23/2012   Procedure: LESION REMOVAL HAND;  Surgeon: Cammie Sickle., MD;  Location: [facility];  Service: Orthopedics;  Laterality: Left;   ROTATOR CUFF REPAIR     UPPER GASTROINTESTINAL ENDOSCOPY      Review of systems negative except as noted in HPI / PMHx or noted below:  Review of Systems  Constitutional: Negative.   HENT: Negative.    Eyes: Negative.   Respiratory: Negative.    Cardiovascular: Negative.   Gastrointestinal: Negative.   Genitourinary: Negative.   Musculoskeletal: Negative.   Skin: Negative.   Neurological: Negative.   Endo/Heme/Allergies: Negative.   Psychiatric/Behavioral: Negative.       Objective:   Vitals:   10/11/22 1532  BP: 98/66  Pulse: 75  Resp: 16  Temp: 98.2 F (36.8 C)  SpO2: 96%   Height: '5\' 3"'$  (160 cm)  Weight: 167 lb 8 oz (76 kg)   Physical Exam Constitutional:      Appearance: She is not diaphoretic.  HENT:     Head: Normocephalic.     Right Ear: Tympanic membrane, ear canal and external ear normal.     Left Ear: Tympanic membrane, ear canal and external ear normal.     Nose: Nose normal. No mucosal edema or rhinorrhea.     Mouth/Throat:     Pharynx: Uvula midline. No oropharyngeal exudate.  Eyes:     Conjunctiva/sclera: Conjunctivae normal.  Neck:     Thyroid: No thyromegaly.     Trachea:  Trachea normal. No tracheal tenderness or tracheal deviation.  Cardiovascular:     Rate and Rhythm: Normal rate and regular rhythm.     Heart sounds: Normal heart sounds, S1 normal and S2 normal. No murmur heard. Pulmonary:     Effort: No respiratory distress.     Breath sounds: Normal breath sounds. No stridor. No wheezing or rales.  Lymphadenopathy:     Head:     Right side of head: No tonsillar adenopathy.     Left side of head: No tonsillar adenopathy.     Cervical: No cervical adenopathy.  Skin:    Findings: No erythema or rash.     Nails: There is no clubbing.  Neurological:     Mental Status: She is alert.     Diagnostics:    Spirometry was performed and demonstrated an FEV1 of 2.11 at 78 % of predicted.  Assessment and Plan:   1. Asthma, mild intermittent, well-controlled   2. Vocal cord dysfunction   3. Other allergic rhinitis   4. Chronic idiopathic urticaria   5. Flushing   6. LPRD (laryngopharyngeal reflux disease)    1. Continue the following if needed:   A. Ipratropium 0.06% - 2 sprays each nostril 1-2 times per day  B. Mucinex  C. Gaviscon   D. Pepcid  E. Albuterol HFA   F. Breathing exercises  2. If flare of lower respiratory tract disease:   A. Pulmicort 160 - 2 inhalations 2 time per day   3. Return to clinic in 6 months or earlier if needed  Samantha [name] will utilize a collection of agents as needed as noted above and should she develop a viral respiratory tract infection she can always restart her Pulmicort.  Assuming she does well with this plan I will see her back in this clinic in 6 months or earlier if there is a problem.  Allena Katz, MD Allergy / Immunology [place]

## 2022-10-11 NOTE — Patient Instructions (Addendum)
  1. Continue the following if needed:   A. Ipratropium 0.06% - 2 sprays each nostril 1-2 times per day  B. Mucinex  C. Gaviscon   D. Pepcid  E. Albuterol HFA   F. Breathing exercises  2. If flare of lower respiratory tract disease:   A. Pulmicort 160 - 2 inhalations 2 time per day   3. Return to clinic in 6 months or earlier if needed

## 2022-10-12 ENCOUNTER — Encounter: Payer: Self-pay | Admitting: Allergy and Immunology

## 2022-10-24 ENCOUNTER — Other Ambulatory Visit: Payer: Self-pay | Admitting: Family Medicine

## 2022-10-24 DIAGNOSIS — F419 Anxiety disorder, unspecified: Secondary | ICD-10-CM

## 2022-10-25 ENCOUNTER — Other Ambulatory Visit: Payer: Self-pay

## 2022-10-25 DIAGNOSIS — G43909 Migraine, unspecified, not intractable, without status migrainosus: Secondary | ICD-10-CM

## 2022-10-25 MED ORDER — SUMATRIPTAN SUCCINATE 50 MG PO TABS: ORAL_TABLET | ORAL | 3 refills | Status: DC

## 2022-10-25 NOTE — Telephone Encounter (Signed)
Refill request for  Sumatriptan Succinate 50 mg LR  08/08/22, #10, 3 rf LOV 09/28/22 FOV  11/28/22  Please review and advise.   Thanks. Dm/cma

## 2022-11-03 ENCOUNTER — Other Ambulatory Visit: Payer: Self-pay | Admitting: Family Medicine

## 2022-11-03 DIAGNOSIS — F908 Attention-deficit hyperactivity disorder, other type: Secondary | ICD-10-CM

## 2022-11-03 NOTE — Telephone Encounter (Signed)
Refill request for  Ritalin 10 mg LR  09/09/22, #90, 0 rf LOV  09/29/22 FOV 11/28/22  please review and advise.  Thanks. Dm/cma

## 2022-11-04 MED ORDER — METHYLPHENIDATE HCL 10 MG PO TABS: 10.0000 mg | ORAL_TABLET | Freq: Three times a day (TID) | ORAL | 0 refills | Status: DC

## 2022-11-28 ENCOUNTER — Ambulatory Visit: Payer: No Typology Code available for payment source | Admitting: Family Medicine

## 2022-11-28 ENCOUNTER — Other Ambulatory Visit (HOSPITAL_BASED_OUTPATIENT_CLINIC_OR_DEPARTMENT_OTHER): Payer: Self-pay

## 2022-11-28 VITALS — BP 120/78 | HR 72 | Temp 98.2°F | Ht 63.0 in | Wt 171.2 lb

## 2022-11-28 DIAGNOSIS — J310 Chronic rhinitis: Secondary | ICD-10-CM | POA: Diagnosis not present

## 2022-11-28 DIAGNOSIS — F908 Attention-deficit hyperactivity disorder, other type: Secondary | ICD-10-CM

## 2022-11-28 DIAGNOSIS — Z23 Encounter for immunization: Secondary | ICD-10-CM

## 2022-11-28 DIAGNOSIS — F419 Anxiety disorder, unspecified: Secondary | ICD-10-CM

## 2022-11-28 DIAGNOSIS — F32A Depression, unspecified: Secondary | ICD-10-CM

## 2022-11-28 DIAGNOSIS — F4001 Agoraphobia with panic disorder: Secondary | ICD-10-CM | POA: Diagnosis not present

## 2022-11-28 DIAGNOSIS — G43709 Chronic migraine without aura, not intractable, without status migrainosus: Secondary | ICD-10-CM | POA: Diagnosis not present

## 2022-11-28 DIAGNOSIS — I10 Essential (primary) hypertension: Secondary | ICD-10-CM

## 2022-11-28 MED ORDER — METHYLPHENIDATE HCL 10 MG PO TABS
10.0000 mg | ORAL_TABLET | Freq: Three times a day (TID) | ORAL | 0 refills | Status: DC
  Filled 2022-11-28: qty 90, 30d supply, fill #0

## 2022-11-28 MED ORDER — ALPRAZOLAM 1 MG PO TABS
1.0000 mg | ORAL_TABLET | Freq: Two times a day (BID) | ORAL | 2 refills | Status: DC | PRN
  Filled 2022-11-28: qty 20, 10d supply, fill #0
  Filled 2022-12-20: qty 20, 10d supply, fill #1
  Filled 2023-01-03: qty 20, 10d supply, fill #2

## 2022-11-28 MED ORDER — DIAZEPAM 10 MG PO TABS
10.0000 mg | ORAL_TABLET | Freq: Every day | ORAL | 2 refills | Status: DC | PRN
  Filled 2022-11-28: qty 30, 30d supply, fill #0

## 2022-11-28 MED ORDER — SUMATRIPTAN SUCCINATE 50 MG PO TABS
ORAL_TABLET | ORAL | 3 refills | Status: DC
  Filled 2022-11-28: qty 10, 17d supply, fill #0
  Filled 2022-12-20: qty 10, 17d supply, fill #1
  Filled 2023-01-03: qty 10, 17d supply, fill #2
  Filled 2023-01-19: qty 10, 17d supply, fill #3

## 2022-11-28 NOTE — Assessment & Plan Note (Signed)
Managing well with sumatriptan for acute management.

## 2022-11-28 NOTE — Assessment & Plan Note (Signed)
Stable. Overall improved control. Continue diazepam for baseline control and alprazolam as needed for panic attacks.

## 2022-11-28 NOTE — Assessment & Plan Note (Signed)
Overall improved control. Continue ipratropium and azelastine sprays.

## 2022-11-28 NOTE — Assessment & Plan Note (Signed)
I will renew her methylphenidate today.

## 2022-11-28 NOTE — Progress Notes (Signed)
[facility] [address] [facility] [place] [address] Dept: [phone] Dept Fax: [phone]  Chronic Care Office Visit  Subjective:    Patient ID: Samantha [name], female    DOB: 1971-01-31, 52 y.o..   MRN: [id number]  Chief Complaint  Patient presents with   Medical Management of Chronic Issues    2 month f/u.  C/o having dry skin.     History of Present Illness:  Patient is in today for reassessment of chronic medical issues.  [name] has a history of hypertension and is managed on atenolol 25 mg 1/2 tab daily.   [name] has a history of asthma and allergy symptoms. She is followed by Dr. Gaylyn Rong (allergist).  She notes that currently, her symptoms appear to be in better control. She is using some azelastine spray alogn with her Atreovent spary to control rhinorrhea. She is not having any current wheezing.   [name] has multiple psychiatric issues including anxiety with depression, PTSD, panic disorder, and ADHD. She is managed on Valium for baseline control of her anxiety and management of insomnia, and Xanax for breakthrough panic issues or acute anxiety. She is on methylphenidate TID for management of her ADHD and daytime hypersomnia. She had a recent panic episode at [place] due to trigger by seeing a current employee that favored a friend of hers that died who had previously worked for [employer].  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) 11/21/2018   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/2018   Pulmonary nodules 11/13/2018   Other microscopic hematuria 11/06/2018   Anxiety and depression 07/04/2018   Fecal incontinence 07/02/2018   Leg weakness 06/12/2017   Left ear hearing loss 02/07/2017   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   Borderline hyperlipidemia 11/08/2016   Acute idiopathic gout of multiple sites 11/08/2016   Elbow pain, chronic, right 10/06/2016   Lateral epicondylitis, right elbow 10/06/2016   Fibromyalgia 10/04/2016   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   Migraine headache 07/29/2016   Irritable bowel syndrome with both constipation and diarrhea 07/29/2016   Hives 07/18/2016   Urinary incontinence 06/02/2016   Atherosclerosis of abdominal aorta ([place]) 09/03/2015   Carpal tunnel syndrome 11/19/2014   Primary hypersomnia 11/18/2014   Insomnia 11/18/2014   Lumbar radicular pain 11/18/2014   Chronic pain syndrome 11/18/2014   Cervical disc disorder with radiculopathy 11/18/2014   Chronic female pelvic pain 07/31/2014   Degeneration of intervertebral disc of lumbosacral region 07/31/2014   Rosacea 07/31/2014   Morton's neuroma 07/31/2014   Benign paroxysmal positional vertigo 07/31/2014   Essential hypertension 07/31/2014   History of methicillin resistant Staphylococcus aureus infection 07/09/2014   Past Surgical History:  Procedure Laterality Date   BLADDER SURGERY     age 16- 26   CERVICAL FUSION     08/1998, 03/10/2017   CESAREAN SECTION     COLONOSCOPY     HAND SURGERY     2012, 2013  LAPAROSCOPIC ENDOMETRIOSIS FULGURATION Right    LESION REMOVAL  08/23/2012   Procedure: LESION REMOVAL HAND;  Surgeon: Cammie Sickle., MD;  Location: [facility];  Service: Orthopedics;  Laterality: Left;   ROTATOR CUFF REPAIR     UPPER  GASTROINTESTINAL ENDOSCOPY     Family History  Problem Relation Age of Onset   ADD / ADHD Brother    Stomach cancer Maternal Grandfather    Colon cancer Neg Hx    Esophageal cancer Neg Hx    Rectal cancer Neg Hx    Outpatient Medications Prior to Visit  Medication Sig Dispense Refill   albuterol (VENTOLIN HFA) 108 (90 Base) MCG/ACT inhaler INHALE 2 PUFFS BY MOUTH EVERY 4 HOURS AS NEEDED FOR WHEEZING FOR SHORTNESS OF BREATH 18 g 0   Aspirin (VAZALORE) 81 MG CAPS Take by mouth.     atenolol (TENORMIN) 25 MG tablet TAKE 1 TABLET BY MOUTH ONCE DAILY AS NEEDED 90 tablet 3   AUVI-Q 0.3 MG/0.3ML SOAJ injection Inject 0.3 mg into the muscle as needed for anaphylaxis. As directed for life-threatening allergic reactions 2 each 1   benzonatate (TESSALON) 100 MG capsule Take 1 capsule (100 mg total) by mouth 3 (three) times daily as needed for cough. 90 capsule 1   budesonide (PULMICORT) 180 MCG/ACT inhaler Inhale 2 puffs into the lungs in the morning and at bedtime. 1 each 5   cetirizine (ZYRTEC ALLERGY) 10 MG tablet      cholecalciferol (VITAMIN D) 1000 UNITS tablet Take by mouth Nightly.     EPINEPHrine (EPIPEN 2-PAK) 0.3 mg/0.3 mL IJ SOAJ injection INJECT CONTENTS OF 1 PEN AS NEEDED FOR ALLERGIC REACTION 2 each 2   EPINEPHrine (PRIMATENE MIST) 0.125 MG/ACT AERO Inhale into the lungs.     estradiol (ESTRACE) 0.1 MG/GM vaginal cream Place vaginally.     famotidine (PEPCID AC MAXIMUM STRENGTH) 20 MG tablet Take 20 mg by mouth daily.     ipratropium (ATROVENT) 0.03 % nasal spray Place into the nose.     levalbuterol (XOPENEX HFA) 45 MCG/ACT inhaler INHALE 2 PUFFS BY MOUTH EVERY 4 HOURS AS NEEDED FOR WHEEZING 15 g 0   MAGNESIUM PO Take by mouth as needed.     rosuvastatin (CRESTOR) 10 MG tablet Take 1 tablet (10 mg total) by mouth daily. 90 tablet 3   ALPRAZolam (XANAX) 1 MG tablet Take 1 tablet by mouth twice daily as needed 20 tablet 2   diazepam (VALIUM) 10 MG tablet TAKE 1 TABLET BY MOUTH ONCE  DAILY AS NEEDED FOR ANXIETY 30 tablet 2   methylphenidate (RITALIN) 10 MG tablet Take 1 tablet (10 mg total) by mouth 3 (three) times daily with meals. 90 tablet 0   SUMAtriptan (IMITREX) 50 MG tablet May repeat in 2 hours if headache persists or recurs. 10 tablet 3   No facility-administered medications prior to visit.   Allergies  Allergen Reactions   Bactrim Anaphylaxis   Bee Venom Hives, Itching, Other (See Comments) and Rash   Clindamycin Shortness Of Breath   Cymbalta [Duloxetine Hcl] Shortness Of Breath   Other Hives, Other (See Comments), Rash and Itching    Insect stings Wine spirit   Potassium-Containing Compounds Anaphylaxis, Itching and Swelling   Ramipril Anaphylaxis   Sulfamethoxazole-Trimethoprim Anaphylaxis   Aspartame Other (See Comments)   Bacitra-Neomycin-Polymyxin-Hc Rash   Cranberry Extract Other (See Comments)   Elemental Sulfur Itching   Sulfur Itching and Other (See Comments)   Allegra [Fexofenadine]  She reports tachycardia and elevated BP and low Oxygen level after taking this   Amoxicillin    Benadryl [Diphenhydramine]     Only the liquid form she can not take  because it causes throat closing    Ciprofloxacin    Demeclocycline    Dexlansoprazole    Doxycycline     Possibly joint pain or itch    Erythromycin     Upset stomach   Erythromycin Base    Esomeprazole    Hydrocodone Itching   Hydrocodone Bit-Homatrop Mbr     Not true allergy. "irritated"   Hydrocodone Bit-Homatrop Mbr    Hydromorphone Nausea Only   Levofloxacin     Weakness, myalgia   Levofloxacin    Lorazepam Other (See Comments)    Hallucination   Meloxicam Itching   Naproxen Itching   Naproxen Sodium Itching   Omeprazole-Sodium Bicarbonate     Chest pain-sore all over-HA   Ranitidine     Severe joint pain   Rizatriptan Other (See Comments)   Robaxin [Methocarbamol]     Made her very tired and unable to sleep at night   Tape Other (See Comments)   Tetracyclines &  Related    Tezspire [Tezepelumab] Other (See Comments)   Tizanidine     "knocked her out"- had a rebound migraine   Trazodone    Triamcinolone    Acetaminophen Rash   Etodolac Rash   Neosporin [Neomycin-Bacitracin Zn-Polymyx] Rash   Objective:   Today's Vitals   11/28/22 0920  BP: 120/78  Pulse: 72  Temp: 98.2 F (36.8 C)  TempSrc: Temporal  SpO2: 96%  Weight: 171 lb 3.2 oz (77.7 kg)  Height: 5\' 3"  (1.6 m)   Body mass index is 30.33 kg/m.   General: Well developed, well nourished. No acute distress. Lungs: Clear to auscultation bilaterally. No wheezing, rales or rhonchi. Psych: Alert and oriented. Normal mood and affect.  Health Maintenance Due  Topic Date Due   HIV Screening  Never done   Zoster Vaccines- Shingrix (1 of 2) Never done   DTaP/Tdap/Td (2 - Td or Tdap) 10/29/2022     Assessment & Plan:   Problem List Items Addressed This Visit       Cardiovascular and Mediastinum   Migraine headache    Managing well with sumatriptan for acute management.      Relevant Medications   SUMAtriptan (IMITREX) 50 MG tablet   Essential hypertension - Primary    Blood pressure is in good control. Continue atenolol 25 mg 1/2 tab daily.        Respiratory   Chronic rhinitis    Overall improved control. Continue ipratropium and azelastine sprays.        Other   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)    I will renew her methylphenidate today.      Relevant Medications   methylphenidate (RITALIN) 10 MG tablet   Panic disorder with agoraphobia   Relevant Medications   ALPRAZolam (XANAX) 1 MG tablet   diazepam (VALIUM) 10 MG tablet   Anxiety and depression    Stable. Overall improved control. Continue diazepam for baseline control and alprazolam as needed for panic attacks.      Relevant Medications   ALPRAZolam (XANAX) 1 MG tablet   diazepam (VALIUM) 10 MG tablet   Other Visit Diagnoses     Need for Td vaccine       Relevant  Orders   Td : Tetanus/diphtheria >7yo Preservative  free (  Completed)       Return in about 2 months (around 01/28/2023) for Reassessment.   Haydee Salter, MD

## 2022-11-28 NOTE — Assessment & Plan Note (Signed)
Blood pressure is in good control. Continue atenolol 25 mg 1/2 tab daily.

## 2022-11-30 ENCOUNTER — Encounter: Payer: Self-pay | Admitting: Family Medicine

## 2022-12-02 ENCOUNTER — Ambulatory Visit: Payer: No Typology Code available for payment source | Admitting: Family Medicine

## 2022-12-02 VITALS — BP 122/70 | HR 76 | Temp 97.8°F | Ht 63.0 in | Wt 170.8 lb

## 2022-12-02 DIAGNOSIS — T881XXA Other complications following immunization, not elsewhere classified, initial encounter: Secondary | ICD-10-CM

## 2022-12-02 DIAGNOSIS — L509 Urticaria, unspecified: Secondary | ICD-10-CM

## 2022-12-02 NOTE — Assessment & Plan Note (Signed)
Improved. Continues use of benadryl as needed.

## 2022-12-02 NOTE — Progress Notes (Signed)
[facility] [address] [place] [address] Dept: [phone] Dept Fax: [phone]  Office Visit  Subjective:    Patient ID: Samantha [name], female    DOB: [birth year], 52 y.o..   MRN: [id number]  Chief Complaint  Patient presents with   Arm Pain    Reaction to TD shot that was given on Monday.    History of Present Illness:  Patient is in today for evaluation of pain and swelling in her left upper arm at the site of a TD immunization that was given on Monday. She notes she began having pain, swelling, increased heat, redness, and firmness overnight after receiving the vaccination. In addition to the local reaction, she developed hives in multiple locations on her body. Samantha [name] has been managing this with Benadryl and topical hydrocortisone. She is feeling somewhat better today.  Past Medical History: Patient Active Problem List   Diagnosis Date Noted   Cervical spine arthritis 02/25/2022   Leg swelling 10/19/2021   Interstitial cystitis 09/15/2021   Other urethral stricture, female 09/15/2021   Chronic kidney disease, stage 3a ([place]) 08/20/2021   Prediabetes 08/27/2020   History of Rocky Mountain spotted fever 02/04/2020   Moderate persistent asthma without complication 123XX123   LPRD (laryngopharyngeal reflux disease) 08/02/2019   Chronic rhinitis 08/02/2019   Chronic post-traumatic stress disorder (PTSD) 11/21/2018   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) 11/21/2018   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/2018   Pulmonary nodules 11/13/2018   Other microscopic hematuria 11/06/2018   Anxiety and depression 07/04/2018   Fecal incontinence 07/02/2018   Leg weakness 06/12/2017   Left ear hearing loss 02/07/2017   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/2016   Borderline hyperlipidemia 11/08/2016   Acute idiopathic gout of multiple sites 11/08/2016   Elbow pain,  chronic, right 10/06/2016   Lateral epicondylitis, right elbow 10/06/2016   Fibromyalgia 10/04/2016   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   Migraine headache 07/29/2016   Irritable bowel syndrome with both constipation and diarrhea 07/29/2016   Hives 07/18/2016   Urinary incontinence 06/02/2016   Atherosclerosis of abdominal aorta ([place]) 09/03/2015   Carpal tunnel syndrome 11/19/2014   Primary hypersomnia 11/18/2014   Insomnia 11/18/2014   Lumbar radicular pain 11/18/2014   Chronic pain syndrome 11/18/2014   Cervical disc disorder with radiculopathy 11/18/2014   Chronic female pelvic pain 07/31/2014   Degeneration of intervertebral disc of lumbosacral region 07/31/2014   Rosacea 07/31/2014   Morton's neuroma 07/31/2014   Benign paroxysmal positional vertigo 07/31/2014   Essential hypertension 07/31/2014   History of methicillin resistant Staphylococcus aureus infection 07/09/2014   Past Surgical History:  Procedure Laterality Date   BLADDER SURGERY     age 76- 87   CERVICAL FUSION     08/1998, 03/10/2017   CESAREAN SECTION     COLONOSCOPY     HAND SURGERY     2012, 2013   LAPAROSCOPIC ENDOMETRIOSIS FULGURATION Right    LESION REMOVAL  08/23/2012   Procedure: LESION REMOVAL HAND;  Surgeon: Cammie Sickle., MD;  Location: [facility];  Service: Orthopedics;  Laterality: Left;   ROTATOR CUFF REPAIR     UPPER GASTROINTESTINAL ENDOSCOPY     Family History  Problem Relation Age of Onset   ADD / ADHD Brother    Stomach cancer Maternal Grandfather    Colon cancer Neg Hx    Esophageal cancer Neg Hx    Rectal cancer Neg Hx  Outpatient Medications Prior to Visit  Medication Sig Dispense Refill   albuterol (VENTOLIN HFA) 108 (90 Base) MCG/ACT inhaler INHALE 2 PUFFS BY MOUTH EVERY 4 HOURS AS NEEDED FOR WHEEZING FOR SHORTNESS OF BREATH 18 g 0   ALPRAZolam (XANAX) 1 MG tablet Take 1 tablet (1 mg total) by mouth 2 (two) times daily as needed. 20 tablet 2    Aspirin (VAZALORE) 81 MG CAPS Take by mouth.     atenolol (TENORMIN) 25 MG tablet TAKE 1 TABLET BY MOUTH ONCE DAILY AS NEEDED 90 tablet 3   AUVI-Q 0.3 MG/0.3ML SOAJ injection Inject 0.3 mg into the muscle as needed for anaphylaxis. As directed for life-threatening allergic reactions 2 each 1   benzonatate (TESSALON) 100 MG capsule Take 1 capsule (100 mg total) by mouth 3 (three) times daily as needed for cough. 90 capsule 1   budesonide (PULMICORT) 180 MCG/ACT inhaler Inhale 2 puffs into the lungs in the morning and at bedtime. 1 each 5   cetirizine (ZYRTEC ALLERGY) 10 MG tablet      cholecalciferol (VITAMIN D) 1000 UNITS tablet Take by mouth Nightly.     diazepam (VALIUM) 10 MG tablet Take 1 tablet (10 mg total) by mouth daily as needed for anxiety. 30 tablet 2   EPINEPHrine (EPIPEN 2-PAK) 0.3 mg/0.3 mL IJ SOAJ injection INJECT CONTENTS OF 1 PEN AS NEEDED FOR ALLERGIC REACTION 2 each 2   EPINEPHrine (PRIMATENE MIST) 0.125 MG/ACT AERO Inhale into the lungs.     estradiol (ESTRACE) 0.1 MG/GM vaginal cream Place vaginally.     famotidine (PEPCID AC MAXIMUM STRENGTH) 20 MG tablet Take 20 mg by mouth daily.     ipratropium (ATROVENT) 0.03 % nasal spray Place into the nose.     levalbuterol (XOPENEX HFA) 45 MCG/ACT inhaler INHALE 2 PUFFS BY MOUTH EVERY 4 HOURS AS NEEDED FOR WHEEZING 15 g 0   MAGNESIUM PO Take by mouth as needed.     methylphenidate (RITALIN) 10 MG tablet Take 1 tablet (10 mg total) by mouth 3 (three) times daily with meals. 90 tablet 0   rosuvastatin (CRESTOR) 10 MG tablet Take 1 tablet (10 mg total) by mouth daily. 90 tablet 3   SUMAtriptan (IMITREX) 50 MG tablet Take 1 tablet by mouth once as needed. May repeat in 2 hours if headache persists or recurs. 10 tablet 3   No facility-administered medications prior to visit.   Allergies  Allergen Reactions   Bactrim Anaphylaxis   Bee Venom Hives, Itching, Other (See Comments) and Rash   Clindamycin Shortness Of Breath   Cymbalta  [Duloxetine Hcl] Shortness Of Breath   Other Hives, Other (See Comments), Rash and Itching    Insect stings Wine spirit   Potassium-Containing Compounds Anaphylaxis, Itching and Swelling   Ramipril Anaphylaxis   Sulfamethoxazole-Trimethoprim Anaphylaxis   Aspartame Other (See Comments)   Bacitra-Neomycin-Polymyxin-Hc Rash   Cranberry Extract Other (See Comments)   Elemental Sulfur Itching   Sulfur Itching and Other (See Comments)   Allegra [Fexofenadine]     She reports tachycardia and elevated BP and low Oxygen level after taking this   Amoxicillin    Benadryl [Diphenhydramine]     Only the liquid form she can not take  because it causes throat closing    Ciprofloxacin    Demeclocycline    Dexlansoprazole    Doxycycline     Possibly joint pain or itch    Erythromycin     Upset stomach   Erythromycin Base  Esomeprazole    Hydrocodone Itching   Hydrocodone Bit-Homatrop Mbr     Not true allergy. "irritated"   Hydrocodone Bit-Homatrop Mbr    Hydromorphone Nausea Only   Levofloxacin     Weakness, myalgia   Levofloxacin    Lorazepam Other (See Comments)    Hallucination   Meloxicam Itching   Naproxen Itching   Naproxen Sodium Itching   Omeprazole-Sodium Bicarbonate     Chest pain-sore all over-HA   Ranitidine     Severe joint pain   Rizatriptan Other (See Comments)   Robaxin [Methocarbamol]     Made her very tired and unable to sleep at night   Tape Other (See Comments)   Tetanus-Diphtheria Toxoids Td Hives    Robust local reaction   Tetracyclines & Related    Tezspire [Tezepelumab] Other (See Comments)   Tizanidine     "knocked her out"- had a rebound migraine   Trazodone    Triamcinolone    Acetaminophen Rash   Etodolac Rash   Neosporin [Neomycin-Bacitracin Zn-Polymyx] Rash     Objective:   Today's Vitals   12/02/22 0904  BP: 122/70  Pulse: 76  Temp: 97.8 F (36.6 C)  TempSrc: Temporal  SpO2: 98%  Weight: 170 lb 12.8 oz (77.5 kg)  Height: 5\' 3"   (1.6 m)   Body mass index is 30.26 kg/m.   General: Well developed, well nourished. No acute distress. Extremities: There is an area of redness over the lateral left upper arm. There is increased warmth   and an egg-shaped area of firmness under the skin. There is no visible hive reaction at present. Skin: Warm and dry. No rashes. Neuro: CN II-XII intact. Normal sensation and DTR bilaterally. Psych: Alert and oriented. Normal mood and affect.  Health Maintenance Due  Topic Date Due   HIV Screening  Never done   Zoster Vaccines- Shingrix (1 of 2) Never done     Assessment & Plan:   Problem List Items Addressed This Visit       Musculoskeletal and Integument   Hives    Improved. Continues use of benadryl as needed.      Other Visit Diagnoses     Local reaction to immunization, initial encounter    -  Primary   Cotninue use of Benadryl and HC cream as needed. Recommend agaisnt further tetanus vaccination.       Return for Follow-up as scheduled.   Haydee Salter, MD

## 2022-12-21 ENCOUNTER — Other Ambulatory Visit: Payer: Self-pay

## 2023-01-01 ENCOUNTER — Ambulatory Visit
Admission: RE | Admit: 2023-01-01 | Discharge: 2023-01-01 | Disposition: A | Discharge status: Home / Self Care | Payer: No Typology Code available for payment source | Source: Ambulatory Visit | Attending: Internal Medicine | Admitting: Internal Medicine

## 2023-01-01 VITALS — BP 131/84 | HR 62 | Temp 97.5°F | Resp 17

## 2023-01-01 DIAGNOSIS — R319 Hematuria, unspecified: Secondary | ICD-10-CM

## 2023-01-01 LAB — POCT URINALYSIS DIP (MANUAL ENTRY)
Bilirubin, UA: NEGATIVE
Glucose, UA: NEGATIVE mg/dL
Ketones, POC UA: NEGATIVE mg/dL
Leukocytes, UA: NEGATIVE
Nitrite, UA: NEGATIVE
Protein Ur, POC: NEGATIVE mg/dL
Spec Grav, UA: 1.015 (ref 1.010–1.025)
Urobilinogen, UA: 0.2 E.U./dL
pH, UA: 6.5 (ref 5.0–8.0)

## 2023-01-01 MED ORDER — ONDANSETRON 4 MG PO TBDP: 4.0000 mg | ORAL_TABLET | Freq: Three times a day (TID) | ORAL | 0 refills | Status: DC | PRN

## 2023-01-01 NOTE — ED Triage Notes (Signed)
Pt presents with c/o cloudy and dark yellow urine. States she also noticed blood clots in her urine 2 days ago.   Pt denies pain and reports urinary retention that started last night. Home interventions: Azo.

## 2023-01-01 NOTE — ED Provider Notes (Signed)
[facility]    CSN: [id number] Arrival date & time: 01/01/23  1141      History   Chief Complaint Chief Complaint  Patient presents with   Urinary Retention    HPI Samantha [name] is a 52 y.o. female comes to the urgent care with bloody urine on Friday.  Patient noticed bloody urine on Friday.  She saw a lot of clots in her urine.  A couple of hours after that she started complaining of left flank pain and some bladder pressure as well as nausea.  No dysuria, urgency or frequency.  No vomiting.  No fever or chills.  This is a recurrent phenomenon.  Patient says that she has always had some bleeding on urine testing for the past 8 years.  She sees a urologist regularly.  She was supposed to do an ultrasound recently but was unable to get it done.  She denies any dizziness.  She is just started experiencing some diarrhea at this morning.  No abdominal bloating.  She has some lower abdominal pain.  No vaginal discharge.  No vaginal bleeding.  Flank pain is much better.  She has been drinking a lot of fluids. HPI  Past Medical History:  Diagnosis Date   ADD (attention deficit disorder)    Allergy    Anaphylaxis    Arthritis    Asthma    Chronic back pain    Chronic neck pain    Endometriosis    Fibromyalgia    GERD (gastroesophageal reflux disease)    Hernia    Hyperlipidemia    Hypertension    IBS (irritable bowel syndrome)    IC (interstitial cystitis)    Low blood potassium    Multiple allergies    OCD (obsessive compulsive disorder)    Raynaud disease    Vertigo     Patient Active Problem List   Diagnosis Date Noted   Cervical spine arthritis 02/25/2022   Leg swelling 10/19/2021   Interstitial cystitis 09/15/2021   Other urethral stricture, female 09/15/2021   Chronic kidney disease, stage 3a 08/20/2021   Prediabetes 08/27/2020   History of [place] spotted fever 02/04/2020   Moderate persistent asthma without complication 08/02/2019    LPRD (laryngopharyngeal reflux disease) 08/02/2019   Chronic rhinitis 08/02/2019   Chronic post-traumatic stress disorder (PTSD) 11/21/2018   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) 11/21/2018   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/2018   Pulmonary nodules 11/13/2018   Other microscopic hematuria 11/06/2018   Anxiety and depression 07/04/2018   Fecal incontinence 07/02/2018   Leg weakness 06/12/2017   Left ear hearing loss 02/07/2017   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   Borderline hyperlipidemia 11/08/2016   Acute idiopathic gout of multiple sites 11/08/2016   Elbow pain, chronic, right 10/06/2016   Lateral epicondylitis, right elbow 10/06/2016   Fibromyalgia 10/04/2016   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   Migraine headache 07/29/2016   Irritable bowel syndrome with both constipation and diarrhea 07/29/2016   Hives 07/18/2016   Urinary incontinence 06/02/2016   Atherosclerosis of abdominal aorta 09/03/2015   Carpal tunnel syndrome 11/19/2014   Primary hypersomnia 11/18/2014   Insomnia 11/18/2014   Lumbar radicular pain 11/18/2014   Chronic pain syndrome 11/18/2014   Cervical disc disorder with radiculopathy 11/18/2014   Chronic female pelvic pain 07/31/2014   Degeneration of intervertebral disc of lumbosacral region 07/31/2014   Rosacea 07/31/2014   Morton's neuroma 07/31/2014   Benign paroxysmal positional vertigo 07/31/2014  Essential hypertension 07/31/2014   History of methicillin resistant Staphylococcus aureus infection 07/09/2014    Past Surgical History:  Procedure Laterality Date   BLADDER SURGERY     age 62- 73   CERVICAL FUSION     08/1998, 03/10/2017   CESAREAN SECTION     COLONOSCOPY     HAND SURGERY     2012, 2013   LAPAROSCOPIC ENDOMETRIOSIS FULGURATION Right    LESION REMOVAL  08/23/2012   Procedure: LESION REMOVAL HAND;  Surgeon: Wyn Forster., MD;  Location: [facility];   Service: Orthopedics;  Laterality: Left;   ROTATOR CUFF REPAIR     UPPER GASTROINTESTINAL ENDOSCOPY      OB History   No obstetric history on file.      Home Medications    Prior to Admission medications   Medication Sig Start Date End Date Taking? Authorizing Provider  ondansetron (ZOFRAN-ODT) 4 MG disintegrating tablet Take 1 tablet (4 mg total) by mouth every 8 (eight) hours as needed for nausea or vomiting. 01/01/23  Yes Genny Caulder, Britta Mccreedy, MD  albuterol (VENTOLIN HFA) 108 (90 Base) MCG/ACT inhaler INHALE 2 PUFFS BY MOUTH EVERY 4 HOURS AS NEEDED FOR WHEEZING FOR SHORTNESS OF BREATH 09/16/22   Kozlow, Alvira Philips, MD  ALPRAZolam Prudy Feeler) 1 MG tablet Take 1 tablet (1 mg total) by mouth 2 (two) times daily as needed. 11/28/22   Loyola Mast, MD  Aspirin (VAZALORE) 81 MG CAPS Take by mouth.    [provider]  atenolol (TENORMIN) 25 MG tablet TAKE 1 TABLET BY MOUTH ONCE DAILY AS NEEDED 08/22/22   Loyola Mast, MD  AUVI-Q 0.3 MG/0.3ML SOAJ injection Inject 0.3 mg into the muscle as needed for anaphylaxis. As directed for life-threatening allergic reactions 08/10/22   Kozlow, Alvira Philips, MD  benzonatate (TESSALON) 100 MG capsule Take 1 capsule (100 mg total) by mouth 3 (three) times daily as needed for cough. 08/23/22   Birder Robson, MD  budesonide (PULMICORT) 180 MCG/ACT inhaler Inhale 2 puffs into the lungs in the morning and at bedtime. 04/12/22   Kozlow, Alvira Philips, MD  cetirizine (ZYRTEC ALLERGY) 10 MG tablet  09/22/22   [provider]  cholecalciferol (VITAMIN D) 1000 UNITS tablet Take by mouth Nightly.    [provider]  diazepam (VALIUM) 10 MG tablet Take 1 tablet (10 mg total) by mouth daily as needed for anxiety. 11/28/22   Loyola Mast, MD  EPINEPHrine (EPIPEN 2-PAK) 0.3 mg/0.3 mL IJ SOAJ injection INJECT CONTENTS OF 1 PEN AS NEEDED FOR ALLERGIC REACTION 08/23/22   Birder Robson, MD  EPINEPHrine (PRIMATENE MIST) 0.125 MG/ACT AERO Inhale into the lungs.     [provider]  estradiol (ESTRACE) 0.1 MG/GM vaginal cream Place vaginally. 07/02/21   [provider]  famotidine (PEPCID AC MAXIMUM STRENGTH) 20 MG tablet Take 20 mg by mouth daily. 09/16/22   [provider]  ipratropium (ATROVENT) 0.03 % nasal spray Place into the nose. 09/22/22   [provider]  levalbuterol (XOPENEX HFA) 45 MCG/ACT inhaler INHALE 2 PUFFS BY MOUTH EVERY 4 HOURS AS NEEDED FOR WHEEZING 09/13/22   Birder Robson, MD  MAGNESIUM PO Take by mouth as needed.    [provider]  methylphenidate (RITALIN) 10 MG tablet Take 1 tablet (10 mg total) by mouth 3 (three) times daily with meals. 11/28/22 12/28/22  Loyola Mast, MD  rosuvastatin (CRESTOR) 10 MG tablet Take 1 tablet (10 mg total)  by mouth daily. 01/04/22   Loyola Mast, MD  SUMAtriptan (IMITREX) 50 MG tablet Take 1 tablet by mouth once as needed. May repeat in 2 hours if headache persists or recurs. 11/28/22   Loyola Mast, MD    Family History Family History  Problem Relation Age of Onset   ADD / ADHD Brother    Stomach cancer Maternal Grandfather    Colon cancer Neg Hx    Esophageal cancer Neg Hx    Rectal cancer Neg Hx     Social History Social History   Tobacco Use   Smoking status: Never   Smokeless tobacco: Never  Vaping Use   Vaping Use: Never used  Substance Use Topics   Alcohol use: No   Drug use: No     Allergies   Bactrim, Bee venom, Clindamycin, Cymbalta [duloxetine hcl], Other, Potassium-containing compounds, Ramipril, Sulfamethoxazole-trimethoprim, Aspartame, Bacitra-neomycin-polymyxin-hc, Cranberry extract, Elemental sulfur, Sulfur, Allegra [fexofenadine], Amoxicillin, Benadryl [diphenhydramine], Ciprofloxacin, Demeclocycline, Dexlansoprazole, Doxycycline, Erythromycin, Erythromycin base, Esomeprazole, Hydrocodone, Hydrocodone bit-homatrop mbr, Hydrocodone bit-homatrop mbr, Hydromorphone, Levofloxacin, Levofloxacin, Lorazepam, Meloxicam, Naproxen,  Naproxen sodium, Omeprazole-sodium bicarbonate, Ranitidine, Rizatriptan, Robaxin [methocarbamol], Tape, Tetanus-diphtheria toxoids td, Tetracyclines & related, Tezspire [tezepelumab], Tizanidine, Trazodone, Triamcinolone, Acetaminophen, Etodolac, and Neosporin [neomycin-bacitracin zn-polymyx]   Review of Systems Review of Systems As per HPI  Physical Exam Triage Vital Signs ED Triage Vitals  Enc Vitals Group     BP 01/01/23 1209 131/84     Pulse Rate 01/01/23 1209 62     Resp 01/01/23 1209 17     Temp 01/01/23 1209 (!) 97.5 F (36.4 C)     Temp Source 01/01/23 1209 Oral     SpO2 01/01/23 1209 97 %     Weight --      Height --      Head Circumference --      Peak Flow --      Pain Score 01/01/23 1208 0     Pain Loc --      Pain Edu? --      Excl. in GC? --    No data found.  Updated Vital Signs BP 131/84 (BP Location: Left Arm)   Pulse 62   Temp (!) 97.5 F (36.4 C) (Oral)   Resp 17   LMP  (Exact Date)   SpO2 97%   Visual Acuity Right Eye Distance:   Left Eye Distance:   Bilateral Distance:    Right Eye Near:   Left Eye Near:    Bilateral Near:     Physical Exam Vitals and nursing note reviewed.  Constitutional:      General: She is in acute distress.     Appearance: She is not ill-appearing.  Cardiovascular:     Rate and Rhythm: Normal rate and regular rhythm.     Pulses: Normal pulses.     Heart sounds: Normal heart sounds.  Pulmonary:     Effort: Pulmonary effort is normal.     Breath sounds: Normal breath sounds.  Abdominal:     General: Abdomen is flat.     Comments: Suprapubic tenderness with no guarding or rebound tenderness.  Neurological:     Mental Status: She is alert.      UC Treatments / Results  Labs (all labs ordered are listed, but only abnormal results are displayed) Labs Reviewed  POCT URINALYSIS DIP (MANUAL ENTRY) - Abnormal; Notable for the following components:      Result Value   Color, UA light yellow (*)  Blood, UA  small (*)    All other components within normal limits  URINE CULTURE    EKG   Radiology No results found.  Procedures Procedures (including critical care time)  Medications Ordered in UC Medications - No data to display  Initial Impression / Assessment and Plan / UC Course  I have reviewed the triage vital signs and the nursing notes.  Pertinent labs & imaging results that were available during my care of the patient were reviewed by me and considered in my medical decision making (see chart for details).     1.  Hematuria: Point-of-care urinalysis is remarkable for hemoglobin No leukocytes or leukocyte esterase. Patient is advised to follow-up with urologist for further evaluation Zofran as needed for nausea/vomiting Return precautions given. Final Clinical Impressions(s) / UC Diagnoses   Final diagnoses:  Hematuria, unspecified type     Discharge Instructions      Please follow-up with your urologist for further evaluation. You will need imaging. Please maintain adequate hydration Follow-up with primary care physician if you have any other concerns.   ED Prescriptions     Medication Sig Dispense Auth. Provider   ondansetron (ZOFRAN-ODT) 4 MG disintegrating tablet Take 1 tablet (4 mg total) by mouth every 8 (eight) hours as needed for nausea or vomiting. 20 tablet Dontrelle Mazon, Britta Mccreedy, MD      PDMP not reviewed this encounter.   Merrilee Jansky, MD 01/01/23 [phone]

## 2023-01-01 NOTE — Discharge Instructions (Addendum)
Please follow-up with your urologist for further evaluation. You will need imaging. Please maintain adequate hydration Follow-up with primary care physician if you have any other concerns.

## 2023-01-02 ENCOUNTER — Emergency Department (HOSPITAL_COMMUNITY): Payer: No Typology Code available for payment source

## 2023-01-02 ENCOUNTER — Emergency Department (HOSPITAL_COMMUNITY)
Admission: EM | Admit: 2023-01-02 | Discharge: 2023-01-02 | Disposition: A | Discharge status: Home / Self Care | Payer: No Typology Code available for payment source | Attending: Emergency Medicine | Admitting: Emergency Medicine

## 2023-01-02 ENCOUNTER — Other Ambulatory Visit: Payer: Self-pay

## 2023-01-02 ENCOUNTER — Encounter (HOSPITAL_COMMUNITY): Payer: Self-pay

## 2023-01-02 DIAGNOSIS — R600 Localized edema: Secondary | ICD-10-CM | POA: Insufficient documentation

## 2023-01-02 DIAGNOSIS — I129 Hypertensive chronic kidney disease with stage 1 through stage 4 chronic kidney disease, or unspecified chronic kidney disease: Secondary | ICD-10-CM | POA: Diagnosis not present

## 2023-01-02 DIAGNOSIS — R310 Gross hematuria: Secondary | ICD-10-CM | POA: Insufficient documentation

## 2023-01-02 DIAGNOSIS — R319 Hematuria, unspecified: Secondary | ICD-10-CM | POA: Diagnosis present

## 2023-01-02 DIAGNOSIS — R109 Unspecified abdominal pain: Secondary | ICD-10-CM

## 2023-01-02 DIAGNOSIS — N183 Chronic kidney disease, stage 3 unspecified: Secondary | ICD-10-CM | POA: Insufficient documentation

## 2023-01-02 DIAGNOSIS — R1030 Lower abdominal pain, unspecified: Secondary | ICD-10-CM | POA: Diagnosis not present

## 2023-01-02 LAB — URINALYSIS, ROUTINE W REFLEX MICROSCOPIC
Bilirubin Urine: NEGATIVE
Glucose, UA: NEGATIVE mg/dL
Ketones, ur: NEGATIVE mg/dL
Leukocytes,Ua: NEGATIVE
Nitrite: NEGATIVE
Protein, ur: NEGATIVE mg/dL
Specific Gravity, Urine: 1.001 — ABNORMAL LOW (ref 1.005–1.030)
pH: 7 (ref 5.0–8.0)

## 2023-01-02 LAB — URINE CULTURE: Culture: NO GROWTH

## 2023-01-02 LAB — CBC WITH DIFFERENTIAL/PLATELET
Abs Immature Granulocytes: 0.02 10*3/uL (ref 0.00–0.07)
Basophils Absolute: 0 10*3/uL (ref 0.0–0.1)
Basophils Relative: 0 %
Eosinophils Absolute: 0.1 10*3/uL (ref 0.0–0.5)
Eosinophils Relative: 1 %
HCT: 38.9 % (ref 36.0–46.0)
Hemoglobin: 13.1 g/dL (ref 12.0–15.0)
Immature Granulocytes: 0 %
Lymphocytes Relative: 43 %
Lymphs Abs: 2.9 10*3/uL (ref 0.7–4.0)
MCH: 30.3 pg (ref 26.0–34.0)
MCHC: 33.7 g/dL (ref 30.0–36.0)
MCV: 90 fL (ref 80.0–100.0)
Monocytes Absolute: 0.4 10*3/uL (ref 0.1–1.0)
Monocytes Relative: 7 %
Neutro Abs: 3.3 10*3/uL (ref 1.7–7.7)
Neutrophils Relative %: 49 %
Platelets: 215 10*3/uL (ref 150–400)
RBC: 4.32 MIL/uL (ref 3.87–5.11)
RDW: 12.3 % (ref 11.5–15.5)
WBC: 6.8 10*3/uL (ref 4.0–10.5)
nRBC: 0 % (ref 0.0–0.2)

## 2023-01-02 LAB — COMPREHENSIVE METABOLIC PANEL
ALT: 20 U/L (ref 0–44)
AST: 23 U/L (ref 15–41)
Albumin: 4.1 g/dL (ref 3.5–5.0)
Alkaline Phosphatase: 72 U/L (ref 38–126)
Anion gap: 8 (ref 5–15)
BUN: 10 mg/dL (ref 6–20)
CO2: 26 mmol/L (ref 22–32)
Calcium: 9 mg/dL (ref 8.9–10.3)
Chloride: 100 mmol/L (ref 98–111)
Creatinine, Ser: 0.9 mg/dL (ref 0.44–1.00)
GFR, Estimated: >60 mL/min (ref >60)
Glucose, Bld: 90 mg/dL (ref 70–99)
Potassium: 4.6 mmol/L (ref 3.5–5.1)
Sodium: 134 mmol/L — ABNORMAL LOW (ref 135–145)
Total Bilirubin: 0.6 mg/dL (ref 0.3–1.2)
Total Protein: 6.8 g/dL (ref 6.5–8.1)

## 2023-01-02 MED ORDER — DIPHENHYDRAMINE HCL 50 MG/ML IJ SOLN
12.5000 mg | Freq: Once | INTRAMUSCULAR | Status: DC
  Filled 2023-01-02: qty 1

## 2023-01-02 MED ORDER — MORPHINE SULFATE (PF) 4 MG/ML IV SOLN
4.0000 mg | Freq: Once | INTRAVENOUS | Status: AC
  Administered 2023-01-02: 4 mg via INTRAVENOUS
  Filled 2023-01-02: qty 1

## 2023-01-02 MED ORDER — ONDANSETRON HCL 4 MG/2ML IJ SOLN
4.0000 mg | Freq: Once | INTRAMUSCULAR | Status: AC
  Administered 2023-01-02: 4 mg via INTRAVENOUS
  Filled 2023-01-02: qty 2

## 2023-01-02 MED ORDER — DIPHENHYDRAMINE HCL 25 MG PO CAPS
25.0000 mg | ORAL_CAPSULE | Freq: Once | ORAL | Status: AC
  Administered 2023-01-02: 25 mg via ORAL
  Filled 2023-01-02: qty 1

## 2023-01-02 MED ORDER — BELLADONNA ALKALOIDS-OPIUM 16.2-60 MG RE SUPP: 1.0000 | RECTAL | 0 refills | Status: DC | PRN

## 2023-01-02 MED ORDER — SODIUM CHLORIDE 0.9 % IV BOLUS
500.0000 mL | Freq: Once | INTRAVENOUS | Status: AC
  Administered 2023-01-02: 500 mL via INTRAVENOUS

## 2023-01-02 NOTE — ED Provider Notes (Signed)
[name]   CSN: [id number] Arrival date & time: 01/02/23  1331     History  Chief Complaint  Patient presents with   Dysuria    Samantha [name] is a 52 y.o. female.  52 yo female with history of CKD 3 and unknown "blood disorder" presents with concern for hematuria onset Thursday (bright red urine). Friday, developed right flank pain, progressing through the weekend. Went to PCP who recommended CT but couldn't get done for a month so came to the ER. Hx of microscopic hematuria, never gross hematuria.  Non smoker, is on ASA. Associated chills and diarrhea with her symptoms.        Home Medications Prior to Admission medications   Medication Sig Start Date End Date Taking? Authorizing Provider  opium-belladonna (B&O SUPPRETTES) 16.2-60 MG suppository Place 1 suppository rectally as needed for bladder spasms. 01/02/23  Yes Jeannie Fend, PA-C  albuterol (VENTOLIN HFA) 108 (90 Base) MCG/ACT inhaler INHALE 2 PUFFS BY MOUTH EVERY 4 HOURS AS NEEDED FOR WHEEZING FOR SHORTNESS OF BREATH 09/16/22   Kozlow, Alvira Philips, MD  ALPRAZolam Prudy Feeler) 1 MG tablet Take 1 tablet (1 mg total) by mouth 2 (two) times daily as needed. 11/28/22   Loyola Mast, MD  Aspirin (VAZALORE) 81 MG CAPS Take by mouth.    [provider]  atenolol (TENORMIN) 25 MG tablet TAKE 1 TABLET BY MOUTH ONCE DAILY AS NEEDED 08/22/22   Loyola Mast, MD  AUVI-Q 0.3 MG/0.3ML SOAJ injection Inject 0.3 mg into the muscle as needed for anaphylaxis. As directed for life-threatening allergic reactions 08/10/22   Kozlow, Alvira Philips, MD  benzonatate (TESSALON) 100 MG capsule Take 1 capsule (100 mg total) by mouth 3 (three) times daily as needed for cough. 08/23/22   Birder Robson, MD  budesonide (PULMICORT) 180 MCG/ACT inhaler Inhale 2 puffs into the lungs in the morning and at bedtime. 04/12/22   Kozlow, Alvira Philips, MD  cetirizine (ZYRTEC ALLERGY) 10 MG tablet   09/22/22   [provider]  cholecalciferol (VITAMIN D) 1000 UNITS tablet Take by mouth Nightly.    [provider]  diazepam (VALIUM) 10 MG tablet Take 1 tablet (10 mg total) by mouth daily as needed for anxiety. 11/28/22   Loyola Mast, MD  EPINEPHrine (EPIPEN 2-PAK) 0.3 mg/0.3 mL IJ SOAJ injection INJECT CONTENTS OF 1 PEN AS NEEDED FOR ALLERGIC REACTION 08/23/22   Birder Robson, MD  EPINEPHrine (PRIMATENE MIST) 0.125 MG/ACT AERO Inhale into the lungs.    [provider]  estradiol (ESTRACE) 0.1 MG/GM vaginal cream Place vaginally. 07/02/21   [provider]  famotidine (PEPCID AC MAXIMUM STRENGTH) 20 MG tablet Take 20 mg by mouth daily. 09/16/22   [provider]  ipratropium (ATROVENT) 0.03 % nasal spray Place into the nose. 09/22/22   [provider]  levalbuterol (XOPENEX HFA) 45 MCG/ACT inhaler INHALE 2 PUFFS BY MOUTH EVERY 4 HOURS AS NEEDED FOR WHEEZING 09/13/22   Birder Robson, MD  MAGNESIUM PO Take by mouth as needed.    [provider]  methylphenidate (RITALIN) 10 MG tablet Take 1 tablet (10 mg total) by mouth 3 (three) times daily with meals. 11/28/22 12/28/22  Loyola Mast, MD  ondansetron (ZOFRAN-ODT) 4 MG disintegrating tablet Take 1 tablet (4 mg total) by mouth every 8 (eight) hours as needed for nausea or vomiting. 01/01/23   Lamptey, Britta Mccreedy, MD  rosuvastatin (CRESTOR)  10 MG tablet Take 1 tablet (10 mg total) by mouth daily. 01/04/22   Loyola Mast, MD  SUMAtriptan (IMITREX) 50 MG tablet Take 1 tablet by mouth once as needed. May repeat in 2 hours if headache persists or recurs. 11/28/22   Loyola Mast, MD      Allergies    Bactrim, Bee venom, Clindamycin, Cymbalta [duloxetine hcl], Other, Potassium-containing compounds, Ramipril, Sulfamethoxazole-trimethoprim, Aspartame, Bacitra-neomycin-polymyxin-hc, Cranberry extract, Elemental sulfur, Sulfur, Allegra [fexofenadine], Amoxicillin, Benadryl [diphenhydramine],  Ciprofloxacin, Demeclocycline, Dexlansoprazole, Doxycycline, Erythromycin, Erythromycin base, Esomeprazole, Hydrocodone, Hydrocodone bit-homatrop mbr, Hydrocodone bit-homatrop mbr, Hydromorphone, Levofloxacin, Levofloxacin, Lorazepam, Meloxicam, Naproxen, Naproxen sodium, Omeprazole-sodium bicarbonate, Ranitidine, Rizatriptan, Robaxin [methocarbamol], Tape, Tetanus-diphtheria toxoids td, Tetracyclines & related, Tezspire [tezepelumab], Tizanidine, Trazodone, Triamcinolone, Acetaminophen, Etodolac, and Neosporin [neomycin-bacitracin zn-polymyx]    Review of Systems   Review of Systems Negative except as per HPI Physical Exam Updated Vital Signs BP 129/75 (BP Location: Left Arm)   Pulse 60   Temp 98.2 F (36.8 C) (Oral)   Resp 17   Ht  (1.6 m)   Wt 78 kg   LMP  (LMP Unknown)   SpO2 98%   BMI 30.46 kg/m  Physical Exam Vitals and nursing note reviewed.  Constitutional:      General: She is not in acute distress.    Appearance: She is well-developed. She is not diaphoretic.  HENT:     Head: Normocephalic and atraumatic.  Cardiovascular:     Rate and Rhythm: Normal rate and regular rhythm.     Heart sounds: Normal heart sounds.  Pulmonary:     Effort: Pulmonary effort is normal.     Breath sounds: Normal breath sounds.  Abdominal:     Palpations: Abdomen is soft.     Tenderness: There is abdominal tenderness in the suprapubic area.  Musculoskeletal:     Right lower leg: Edema present.     Left lower leg: Edema present.     Comments: Baseline minimal swelling, notes increase in swelling today (trace at time of exam)  Skin:    General: Skin is warm and dry.     Findings: No erythema or rash.  Neurological:     Mental Status: She is alert and oriented to person, place, and time.  Psychiatric:        Behavior: Behavior normal.     ED Results / Procedures / Treatments   Labs (all labs ordered are listed, but only abnormal results are displayed) Labs Reviewed   COMPREHENSIVE METABOLIC PANEL - Abnormal; Notable for the following components:      Result Value   Sodium 134 (*)    All other components within normal limits  URINALYSIS, ROUTINE W REFLEX MICROSCOPIC - Abnormal; Notable for the following components:   Color, Urine COLORLESS (*)    Specific Gravity, Urine 1.001 (*)    Hgb urine dipstick SMALL (*)    Bacteria, UA RARE (*)    All other components within normal limits  CBC WITH DIFFERENTIAL/PLATELET  CBC WITH DIFFERENTIAL/PLATELET    EKG None  Radiology CT Renal Stone Study  Result Date: 01/02/2023 CLINICAL DATA:  Abdominal/flank pain, stone suspected hematuria EXAM: CT ABDOMEN AND PELVIS WITHOUT CONTRAST TECHNIQUE: Multidetector CT imaging of the abdomen and pelvis was performed following the standard protocol without IV contrast. RADIATION DOSE REDUCTION: This exam was performed according to the departmental dose-optimization program which includes automated exposure control, adjustment of the mA and/or kV according to patient size and/or use of iterative reconstruction technique. COMPARISON:  None Available. FINDINGS: Lower chest: Subsegmental atelectasis or scarring in the lingula. Hepatobiliary: No focal liver abnormality is seen. No gallstones, gallbladder wall thickening, or biliary dilatation. Pancreas: Fatty atrophy of the pancreatic head. No ductal dilatation or inflammation. Spleen: Normal in size without focal abnormality. Splenule inferiorly. Adrenals/Urinary Tract: Normal adrenal glands. No hydronephrosis. Extrarenal pelvis configuration of the right kidney. No renal, ureteral or bladder stones. No perinephric edema or inflammation. No evidence of contour deforming renal mass on this unenhanced exam. Physiologically distended urinary bladder. No bladder stone or wall thickening. Stomach/Bowel: Unremarkable unenhanced appearance of the stomach. There is no bowel obstruction or inflammatory change. Small to moderate volume of colonic  stool. No colonic inflammation. Occasional colonic diverticula without diverticulitis. Normal appendix. Vascular/Lymphatic: Normal caliber abdominal aorta with mild atherosclerosis. No abdominopelvic adenopathy. Reproductive: Uterus and bilateral adnexa are unremarkable. Other: Small fat containing umbilical hernia. No ascites or free air. Musculoskeletal: Mild L5-S1 degenerative disc disease. Lower lumbar facet hypertrophy. There are no acute or suspicious osseous abnormalities. IMPRESSION: 1. No renal stones or obstructive uropathy. No explanation for hematuria. 2. No acute abnormality in the abdomen/pelvis. 3. Minimal colonic diverticulosis without diverticulitis. Aortic Atherosclerosis (ICD10-I70.0). Electronically Signed   By: Narda Rutherford M.D.   On: 01/02/2023 16:04    Procedures Procedures    Medications Ordered in ED Medications  sodium chloride 0.9 % bolus 500 mL (500 mLs Intravenous New Bag/Given 01/02/23 1810)  ondansetron (ZOFRAN) injection 4 mg (4 mg Intravenous Given 01/02/23 1809)  morphine (PF) 4 MG/ML injection 4 mg (4 mg Intravenous Given 01/02/23 1809)    ED Course/ Medical Decision Making/ A&P                             Medical Decision Making Amount and/or Complexity of Data Reviewed Labs: ordered. Radiology: ordered.  Risk Prescription drug management.   This patient presents to the ED for concern of hematuria, flank pain, this involves an extensive number of treatment options, and is a complaint that carries with it a high risk of complications and morbidity.  The differential diagnosis includes but not limited to acute cystitis, ureterolithiasis, interstitial cystitis   Co morbidities that complicate the patient evaluation  IBS, GERD, fibromyalgia, Raynaud's, endometriosis, ADD, OCD, hypertension, interstitial cystitis, hyperlipidemia   Additional history obtained:  Additional history obtained from spouse at bedside who contributes to history as  above External records from outside source obtained and reviewed including prior labs on file for comparison   Lab Tests:  I Ordered, and personally interpreted labs.  The pertinent results include: CBC within normal meds including normal H&H.  CMP with small hemoglobin, negative for nitrites and leukocytes.  CMP without significant findings   Imaging Studies ordered:  I ordered imaging studies including CT stone study I independently visualized and interpreted imaging which showed no acute abnormality I agree with the radiologist interpretation  Problem List / ED Course / Critical interventions / Medication management  52 year old female presents with concern for hematuria and right flank pain as above.,  On exam found to have suprapubic tenderness.  Labs reassuring although does have small hemoglobin with 0-5 red blood cells on urinalysis.  CT unremarkable.  Suspect pain is related to patient's interstitial cystitis.  Discharged with prescription for belladonna suppository and recommend follow-up with urology as planned. I ordered medication including morphine, Zofran, IV fluids for pain Reevaluation of the patient after these medicines showed that the patient improved I have  reviewed the patients home medicines and have made adjustments as needed   Social Determinants of Health:  Lives with spouse, has PCP, is scheduled to see urology   Test / Admission - Considered:  Consider urine culture however negative for leukocytes and nitrates         Final Clinical Impression(s) / ED Diagnoses Final diagnoses:  Gross hematuria  Flank pain    Rx / DC Orders ED Discharge Orders          Ordered    opium-belladonna (B&O SUPPRETTES) 16.2-60 MG suppository  As needed        01/02/23 1829              Jeannie Fend, PA-C 01/02/23 1832    Terald Sleeper, MD 01/02/23 [phone]

## 2023-01-02 NOTE — ED Notes (Addendum)
Pt ambulated to the bathroom to and from without assistance-tolerated well.

## 2023-01-02 NOTE — ED Notes (Signed)
Pt ambulated to the bathroom; tolerated well.

## 2023-01-02 NOTE — ED Notes (Signed)
Patient given discharge instructions and follow up care. Patient verbalized understanding. IV removed with catheter intact. Patient ambulatory out of ED with family. 

## 2023-01-02 NOTE — Discharge Instructions (Signed)
Try suppositories for bladder pain. Follow up with your PCP, see urology as discussed.

## 2023-01-02 NOTE — ED Triage Notes (Signed)
Patient urinated clots of blood. Pain from her right flank area that radiates to her belly button. Burning with urination.

## 2023-01-03 ENCOUNTER — Other Ambulatory Visit: Payer: Self-pay | Admitting: Family Medicine

## 2023-01-03 ENCOUNTER — Other Ambulatory Visit: Payer: Self-pay

## 2023-01-03 DIAGNOSIS — E782 Mixed hyperlipidemia: Secondary | ICD-10-CM

## 2023-01-04 ENCOUNTER — Other Ambulatory Visit: Payer: Self-pay | Admitting: Family Medicine

## 2023-01-04 ENCOUNTER — Other Ambulatory Visit (HOSPITAL_BASED_OUTPATIENT_CLINIC_OR_DEPARTMENT_OTHER): Payer: Self-pay

## 2023-01-04 DIAGNOSIS — F908 Attention-deficit hyperactivity disorder, other type: Secondary | ICD-10-CM

## 2023-01-04 MED ORDER — METHYLPHENIDATE HCL 10 MG PO TABS
10.0000 mg | ORAL_TABLET | Freq: Three times a day (TID) | ORAL | 0 refills | Status: DC
  Filled 2023-01-04 (×2): qty 51, 17d supply, fill #0
  Filled 2023-01-04: qty 51, 17d supply, fill #1
  Filled 2023-01-04 (×2): qty 39, 13d supply, fill #0

## 2023-01-05 ENCOUNTER — Telehealth: Payer: Self-pay

## 2023-01-05 NOTE — Transitions of Care (Post Inpatient/ED Visit) (Signed)
   01/05/2023  Name: Samantha [name] MRN: [id number] DOB: 01/09/1971  Today's TOC FU Call Status: Today's TOC FU Call Status:: Successful TOC FU Call Competed TOC FU Call Complete Date: 01/05/23  Transition Care Management Follow-up Telephone Call Date of Discharge: 01/02/23 Discharge Facility: [facility]) Type of Discharge: Emergency Department How have you been since you were released from the hospital?: Same Any questions or concerns?: No  Items Reviewed: Did you receive and understand the discharge instructions provided?: Yes Medications obtained and verified?: Yes (Medications Reviewed) Any new allergies since your discharge?: No Dietary orders reviewed?: NA Do you have support at home?: Yes People in Home: spouse  Home Care and Equipment/Supplies: Were Home Health Services Ordered?: NA Any new equipment or medical supplies ordered?: NA  Functional Questionnaire: Do you need assistance with bathing/showering or dressing?: No Do you need assistance with meal preparation?: No Do you need assistance with eating?: No Do you have difficulty maintaining continence: No Do you need assistance with getting out of bed/getting out of a chair/moving?: No Do you have difficulty managing or taking your medications?: No  Follow up appointments reviewed: PCP Follow-up appointment confirmed?: No MD Provider Line Number:[phone] Given: No Specialist Hospital Follow-up appointment confirmed?: Yes Date of Specialist follow-up appointment?: 01/16/23 Do you need transportation to your follow-up appointment?: No Do you understand care options if your condition(s) worsen?: Yes-patient verbalized understanding    SIGNATURE Arvil Persons, BSN, RN

## 2023-01-17 ENCOUNTER — Other Ambulatory Visit (HOSPITAL_BASED_OUTPATIENT_CLINIC_OR_DEPARTMENT_OTHER): Payer: Self-pay

## 2023-01-17 ENCOUNTER — Other Ambulatory Visit (HOSPITAL_COMMUNITY): Payer: Self-pay

## 2023-01-17 MED ORDER — URIBEL 118 MG PO CAPS
118.0000 mg | ORAL_CAPSULE | Freq: Four times a day (QID) | ORAL | 1 refills | Status: DC | PRN
  Filled 2023-01-17 (×2): qty 30, 8d supply, fill #0

## 2023-01-19 ENCOUNTER — Other Ambulatory Visit: Payer: Self-pay

## 2023-01-19 ENCOUNTER — Other Ambulatory Visit: Payer: Self-pay | Admitting: Family Medicine

## 2023-01-19 DIAGNOSIS — F4001 Agoraphobia with panic disorder: Secondary | ICD-10-CM

## 2023-01-19 NOTE — Telephone Encounter (Signed)
Refill request for  Alprazolam 1 mg LR  11/28/22, #20, 2 rf LVO 11/28/22 FOV  02/20/23  Please review and advise.  Thanks. Dm/cma

## 2023-01-20 ENCOUNTER — Other Ambulatory Visit (HOSPITAL_BASED_OUTPATIENT_CLINIC_OR_DEPARTMENT_OTHER): Payer: Self-pay

## 2023-01-20 MED ORDER — ALPRAZOLAM 1 MG PO TABS
1.0000 mg | ORAL_TABLET | Freq: Two times a day (BID) | ORAL | 0 refills | Status: DC | PRN
Start: 2023-01-20 — End: 2023-02-07
  Filled 2023-01-20: qty 20, 10d supply, fill #0

## 2023-01-31 ENCOUNTER — Other Ambulatory Visit: Payer: Self-pay | Admitting: Urology

## 2023-02-02 NOTE — Progress Notes (Addendum)
COVID Vaccine received:  []  No [x]  Yes Date of any COVID positive Test in last 46 days:No  PCP - Herbie Drape MD Cardiologist -   Chest x-ray - 06/22/11 EPIC EKG -   02/13/23 EPIC Stress Test - Greater than 10 yrs ECHO - No Cardiac Cath -  No Bowel Prep - [x]  No  []   Yes ______  Pacemaker / ICD device [x]  No []  Yes   Spinal Cord Stimulator:[x]  No []  Yes       History of Sleep Apnea? [x]  No []  Yes   CPAP used?- [x]  No []  Yes    Does the patient monitor blood sugar?          [x]  No []  Yes  []  N/A  Patient has: []  NO Hx DM   [x]  Pre-DM                 []  DM1  []   DM2 Does patient have a [employer] or Dexacom? [x]  No []  Yes   Fasting Blood Sugar Ranges-  Checks Blood Sugar 1 times a Month  GLP1 agonist / usual dose - no GLP1 instructions:  SGLT-2 inhibitors / usual dose -no  SGLT-2 instructions:   Blood Thinner / Instructions: Aspirin Instructions:81 mg ASA daily. Pt. Was instucted by MD office to hold ASA for 5 days prior to surgery.She agreed last dose to be 02/16/23  Comments:   Activity level: Patient is able  to climb a flight of stairs without difficulty; [x]  No CP  [x]  No SOB.   Patient can perform ADLs without assistance.   Anesthesia review: HTN,CKD3, abnl. EKG, Atherosclerosis abdom. Aorta, IgG antibody, 82 allergies,   Patient denies shortness of breath, fever, cough and chest pain at PAT appointment.  Patient verbalized understanding and agreement to the Pre-Surgical Instructions that were given to them at this PAT appointment. Patient was also educated of the need to review these PAT instructions again prior to his/her surgery.I reviewed the appropriate phone numbers to call if they have any and questions or concerns.

## 2023-02-02 NOTE — Patient Instructions (Addendum)
SURGICAL WAITING ROOM VISITATION  Patients having surgery or a procedure may have no more than 2 support people in the waiting area - these visitors may rotate.    Children under the age of 51 must have an adult with them who is not the patient.  Due to an increase in RSV and influenza rates and associated hospitalizations, children ages 33 and under may not visit patients in [facility] hospitals.  If the patient needs to stay at the hospital during part of their recovery, the visitor guidelines for inpatient rooms apply. Pre-op nurse will coordinate an appropriate time for 1 support person to accompany patient in pre-op.  This support person may not rotate.    Please refer to the [facility] website for the visitor guidelines for Inpatients (after your surgery is over and you are in a regular room).       Your procedure is scheduled on:    Report to [facility] Main Entrance    Report to admitting at AM   Call this number if you have problems the morning of surgery [phone]   Do not eat food :After Midnight.   After Midnight you may have the following liquids until ______ AM/ PM DAY OF SURGERY  Water Non-Citrus Juices (without pulp, NO RED-Apple, White grape, White cranberry) Black Coffee (NO MILK/CREAM OR CREAMERS, sugar ok)  Clear Tea (NO MILK/CREAM OR CREAMERS, sugar ok) regular and decaf                             Plain Jell-O (NO RED)                                           Fruit ices (not with fruit pulp, NO RED)                                     Popsicles (NO RED)                                                               Sports drinks like Gatorade (NO RED)           .        The day of surgery:  Drink ONE (1) Pre-Surgery  G2 at 6:30AM the morning of surgery. Drink in one sitting. Do not sip.  This drink was given to you during your hospital  pre-op appointment visit. Nothing else to drink after completing the  Pre-Surgery G2.          If  you have questions, please contact your surgeon's office.   FOLLOW BOWEL PREP AND ANY ADDITIONAL PRE OP INSTRUCTIONS YOU RECEIVED FROM YOUR SURGEON'S OFFICE!!!     Oral Hygiene is also important to reduce your risk of infection.                                    Remember - BRUSH YOUR TEETH THE MORNING OF SURGERY WITH YOUR REGULAR TOOTHPASTE  DENTURES WILL BE REMOVED PRIOR TO SURGERY PLEASE DO NOT APPLY "Poly grip" OR ADHESIVES!!!      Take these medicines the morning of surgery with A SIP OF WATER: Xanax, Atenolol, Tessalon, Zyrtec, Pepcid, Zofran, Rosuvastatin, Imitrex             You may not have any metal on your body including hair pins, jewelry, and body piercing             Do not wear make-up, lotions, powders, perfumes/cologne, or deodorant  Do not wear nail polish including gel and S&S, artificial/acrylic nails, or any other type of covering on natural nails including finger and toenails. If you have artificial nails, gel coating, etc. that needs to be removed by a nail salon please have this removed prior to surgery or surgery may need to be canceled/ delayed if the surgeon/ anesthesia feels like they are unable to be safely monitored.   Do not shave  48 hours prior to surgery.              Do not bring valuables to the hospital. [facility] IS NOT             RESPONSIBLE   FOR VALUABLES.   Contacts, glasses, dentures or bridgework may not be worn into surgery.  DO NOT BRING YOUR HOME MEDICATIONS TO THE HOSPITAL. PHARMACY WILL DISPENSE MEDICATIONS LISTED ON YOUR MEDICATION LIST TO YOU DURING YOUR ADMISSION IN THE HOSPITAL!    Patients discharged on the day of surgery will not be allowed to drive home.  Someone NEEDS to stay with you for the first 24 hours after anesthesia.   Special Instructions: Bring a copy of your healthcare power of attorney and living will documents the day of surgery if you haven't scanned them before.              Please read over the following  fact sheets you were given: IF YOU HAVE QUESTIONS ABOUT YOUR PRE-OP INSTRUCTIONS PLEASE CALL [phone]   If you received a COVID test during your pre-op visit  it is requested that you wear a mask when out in public, stay away from anyone that may not be feeling well and notify your surgeon if you develop symptoms. If you test positive for Covid or have been in contact with anyone that has tested positive in the last 10 days please notify you surgeon.    [place] - Preparing for Surgery Before surgery, you can play an important role.  Because skin is not sterile, your skin needs to be as free of germs as possible.  You can reduce the number of germs on your skin by washing with CHG (chlorahexidine gluconate) soap before surgery.  CHG is an antiseptic cleaner which kills germs and bonds with the skin to continue killing germs even after washing. Please DO NOT use if you have an allergy to CHG or antibacterial soaps.  If your skin becomes reddened/irritated stop using the CHG and inform your nurse when you arrive at Short Stay. Do not shave (including legs and underarms) for at least 48 hours prior to the first CHG shower.  You may shave your face/neck.  Please follow these instructions carefully:  1.  Shower with CHG Soap the night before surgery and the  morning of surgery.  2.  If you choose to wash your hair, wash your hair first as usual with your normal  shampoo.  3.  After you shampoo, rinse your hair and  body thoroughly to remove the shampoo.                             4.  Use CHG as you would any other liquid soap.  You can apply chg directly to the skin and wash.  Gently with a scrungie or clean washcloth.  5.  Apply the CHG Soap to your body ONLY FROM THE NECK DOWN.   Do   not use on face/ open                           Wound or open sores. Avoid contact with eyes, ears mouth and   genitals (private parts).                       Wash face,  Genitals (private parts) with your normal  soap.             6.  Wash thoroughly, paying special attention to the area where your    surgery  will be performed.  7.  Thoroughly rinse your body with warm water from the neck down.  8.  DO NOT shower/wash with your normal soap after using and rinsing off the CHG Soap.                9.  Pat yourself dry with a clean towel.            10.  Wear clean pajamas.            11.  Place clean sheets on your bed the night of your first shower and do not  sleep with pets. Day of Surgery : Do not apply any lotions/deodorants the morning of surgery.  Please wear clean clothes to the hospital/surgery center.  FAILURE TO FOLLOW THESE INSTRUCTIONS MAY RESULT IN THE CANCELLATION OF YOUR SURGERY  PATIENT SIGNATURE_________________________________  NURSE SIGNATURE__________________________________  ________________________________________________________________________

## 2023-02-07 ENCOUNTER — Other Ambulatory Visit: Payer: Self-pay | Admitting: Family Medicine

## 2023-02-07 ENCOUNTER — Other Ambulatory Visit (HOSPITAL_BASED_OUTPATIENT_CLINIC_OR_DEPARTMENT_OTHER): Payer: Self-pay

## 2023-02-07 DIAGNOSIS — F908 Attention-deficit hyperactivity disorder, other type: Secondary | ICD-10-CM

## 2023-02-07 DIAGNOSIS — G43709 Chronic migraine without aura, not intractable, without status migrainosus: Secondary | ICD-10-CM

## 2023-02-07 DIAGNOSIS — F4001 Agoraphobia with panic disorder: Secondary | ICD-10-CM

## 2023-02-07 MED ORDER — SUMATRIPTAN SUCCINATE 50 MG PO TABS
ORAL_TABLET | ORAL | 3 refills | Status: DC
Start: 2023-02-07 — End: 2023-04-24
  Filled 2023-02-07: qty 10, 17d supply, fill #0
  Filled 2023-03-02: qty 10, 17d supply, fill #1
  Filled 2023-03-27: qty 10, 17d supply, fill #2
  Filled 2023-04-13: qty 10, 17d supply, fill #3

## 2023-02-07 MED ORDER — ALPRAZOLAM 1 MG PO TABS
1.0000 mg | ORAL_TABLET | Freq: Two times a day (BID) | ORAL | 2 refills | Status: DC | PRN
Start: 2023-02-07 — End: 2023-04-05
  Filled 2023-02-07: qty 20, 10d supply, fill #0
  Filled 2023-03-02: qty 20, 10d supply, fill #1
  Filled 2023-03-27: qty 20, 10d supply, fill #2

## 2023-02-07 MED ORDER — METHYLPHENIDATE HCL 10 MG PO TABS
10.0000 mg | ORAL_TABLET | Freq: Three times a day (TID) | ORAL | 0 refills | Status: DC
Start: 2023-02-07 — End: 2023-03-08
  Filled 2023-02-07: qty 90, 30d supply, fill #0

## 2023-02-08 ENCOUNTER — Encounter (HOSPITAL_COMMUNITY): Payer: Self-pay

## 2023-02-13 ENCOUNTER — Encounter (HOSPITAL_COMMUNITY): Payer: Self-pay

## 2023-02-13 ENCOUNTER — Other Ambulatory Visit: Payer: Self-pay

## 2023-02-13 ENCOUNTER — Encounter (HOSPITAL_COMMUNITY)
Admission: RE | Admit: 2023-02-13 | Discharge: 2023-02-13 | Disposition: A | Discharge status: Home / Self Care | Payer: No Typology Code available for payment source | Source: Ambulatory Visit | Attending: Urology | Admitting: Urology

## 2023-02-13 VITALS — BP 129/78 | HR 57 | Temp 98.4°F | Resp 16 | Ht 63.0 in | Wt 170.0 lb

## 2023-02-13 DIAGNOSIS — N3011 Interstitial cystitis (chronic) with hematuria: Secondary | ICD-10-CM | POA: Diagnosis not present

## 2023-02-13 DIAGNOSIS — M797 Fibromyalgia: Secondary | ICD-10-CM | POA: Insufficient documentation

## 2023-02-13 DIAGNOSIS — I498 Other specified cardiac arrhythmias: Secondary | ICD-10-CM | POA: Insufficient documentation

## 2023-02-13 DIAGNOSIS — I129 Hypertensive chronic kidney disease with stage 1 through stage 4 chronic kidney disease, or unspecified chronic kidney disease: Secondary | ICD-10-CM | POA: Insufficient documentation

## 2023-02-13 DIAGNOSIS — Z01818 Encounter for other preprocedural examination: Secondary | ICD-10-CM | POA: Insufficient documentation

## 2023-02-13 DIAGNOSIS — I10 Essential (primary) hypertension: Secondary | ICD-10-CM

## 2023-02-13 DIAGNOSIS — N189 Chronic kidney disease, unspecified: Secondary | ICD-10-CM | POA: Diagnosis not present

## 2023-02-13 DIAGNOSIS — R7303 Prediabetes: Secondary | ICD-10-CM | POA: Diagnosis not present

## 2023-02-13 HISTORY — DX: Umbilical hernia without obstruction or gangrene: K42.9

## 2023-02-13 HISTORY — DX: Headache, unspecified: R51.9

## 2023-02-13 HISTORY — DX: Urticaria due to cold and heat: L50.2

## 2023-02-13 HISTORY — DX: Anxiety disorder, unspecified: F41.9

## 2023-02-13 HISTORY — DX: Chronic kidney disease, unspecified: N18.9

## 2023-02-13 HISTORY — DX: Hypersomnia, unspecified: G47.10

## 2023-02-13 HISTORY — DX: Anemia, unspecified: D64.9

## 2023-02-13 HISTORY — DX: Other complications of anesthesia, initial encounter: T88.59XA

## 2023-02-13 HISTORY — DX: Cardiac arrhythmia, unspecified: I49.9

## 2023-02-13 HISTORY — DX: Other intervertebral disc degeneration, lumbosacral region: M51.37

## 2023-02-13 HISTORY — DX: Other intervertebral disc degeneration, lumbosacral region without mention of lumbar back pain or lower extremity pain: M51.379

## 2023-02-13 HISTORY — DX: Other specified abnormal immunological findings in serum: R76.8

## 2023-02-13 HISTORY — DX: Migraine, unspecified, not intractable, without status migrainosus: G43.909

## 2023-02-13 HISTORY — DX: Raynaud's syndrome without gangrene: I73.00

## 2023-02-13 HISTORY — DX: Family history of other specified conditions: Z84.89

## 2023-02-13 HISTORY — DX: Other specified abnormal immunological findings in serum: R76.89

## 2023-02-13 LAB — CBC
HCT: 42.3 % (ref 36.0–46.0)
Hemoglobin: 14.1 g/dL (ref 12.0–15.0)
MCH: 30.2 pg (ref 26.0–34.0)
MCHC: 33.3 g/dL (ref 30.0–36.0)
MCV: 90.6 fL (ref 80.0–100.0)
Platelets: 238 10*3/uL (ref 150–400)
RBC: 4.67 MIL/uL (ref 3.87–5.11)
RDW: 12.3 % (ref 11.5–15.5)
WBC: 7.1 10*3/uL (ref 4.0–10.5)
nRBC: 0 % (ref 0.0–0.2)

## 2023-02-13 LAB — BASIC METABOLIC PANEL
Anion gap: 9 (ref 5–15)
BUN: 13 mg/dL (ref 6–20)
CO2: 27 mmol/L (ref 22–32)
Calcium: 9.5 mg/dL (ref 8.9–10.3)
Chloride: 104 mmol/L (ref 98–111)
Creatinine, Ser: 1.04 mg/dL — ABNORMAL HIGH (ref 0.44–1.00)
GFR, Estimated: >60 mL/min (ref >60)
Glucose, Bld: 97 mg/dL (ref 70–99)
Potassium: 4.1 mmol/L (ref 3.5–5.1)
Sodium: 140 mmol/L (ref 135–145)

## 2023-02-13 LAB — GLUCOSE, CAPILLARY: Glucose-Capillary: 86 mg/dL (ref 70–99)

## 2023-02-13 LAB — SURGICAL PCR SCREEN
MRSA, PCR: NEGATIVE
Staphylococcus aureus: NEGATIVE

## 2023-02-14 LAB — HEMOGLOBIN A1C
Hgb A1c MFr Bld: 5.6 % (ref 4.8–5.6)
Mean Plasma Glucose: 114 mg/dL

## 2023-02-17 ENCOUNTER — Encounter: Payer: Self-pay | Admitting: Internal Medicine

## 2023-02-17 ENCOUNTER — Ambulatory Visit: Payer: No Typology Code available for payment source | Attending: Internal Medicine | Admitting: Internal Medicine

## 2023-02-17 VITALS — BP 128/78 | HR 63 | Ht 63.0 in | Wt 172.8 lb

## 2023-02-17 DIAGNOSIS — Z01810 Encounter for preprocedural cardiovascular examination: Secondary | ICD-10-CM | POA: Diagnosis not present

## 2023-02-17 NOTE — Patient Instructions (Signed)
Medication Instructions:  Your physician recommends that you continue on your current medications as directed. Please refer to the Current Medication list given to you today.  *If you need a refill on your cardiac medications before your next appointment, please call your pharmacy*   Follow-Up: At [facility], you and your health needs are our priority.  As part of our continuing mission to provide you with exceptional heart care, we have created designated Provider Care Teams.  These Care Teams include your primary Cardiologist (physician) and Advanced Practice Providers (APPs -  Physician Assistants and Nurse Practitioners) who all work together to provide you with the care you need, when you need it.  We recommend signing up for the patient portal called "MyChart".  Sign up information is provided on this After Visit Summary.  MyChart is used to connect with patients for Virtual Visits (Telemedicine).  Patients are able to view lab/test results, encounter notes, upcoming appointments, etc.  Non-urgent messages can be sent to your provider as well.   To learn more about what you can do with MyChart, go to [email or web address].    Your next appointment:      Provider:    As needed

## 2023-02-17 NOTE — Progress Notes (Signed)
Cardiology Office Note:    Date:  02/17/2023   ID:  Samantha [name], DOB [birth year], MRN [id number]  PCP:  Samantha Mast, MD   [facility] Providers Cardiologist:  Samantha Fus, MD     Referring MD: Samantha Mast, MD   No chief complaint on file. Pre-Op  History of Present Illness:    Samantha [name] is a 52 y.o. female with a hx of anxiety, asthma, GERD, HTN, no cardiac disease, referral to cardiology for pre-op for BL retrograde pyelogram. Had an EKG with mild TWI in lead III and aVF as well as poor r wave progression. Poor baseline. She has 82 reported allergies including aspirin with SOB. She walks up many stairs and goes bowling. She does occasional hiking and martial arts. No active persistent CP.  No smoking.  She had a cardiac stress echo in 2017 that did not show ischemia.   Past Medical History:  Diagnosis Date   ADD (attention deficit disorder)    Allergy    Anaphylaxis    Anemia    Anxiety    Arthritis    Asthma    Chronic back pain    Chronic kidney disease    Chronic neck pain    Complication of anesthesia    Waking up before procedure was over.   Degenerative disc disease at L5-S1 level    Dysrhythmia    Had  "abnormal beats" d/t B/P med she was on. B/P med reduced.   Endometriosis    Familial cold urticaria    Family history of adverse reaction to anesthesia    Fibromyalgia    GERD (gastroesophageal reflux disease)    Headache    Migraines   Hernia    Hyperlipidemia    Hypersomnia    Hypertension    IBS (irritable bowel syndrome)    IC (interstitial cystitis)    IgG Gliadin antibody positive    Low blood potassium    Migraines    Multiple allergies    OCD (obsessive compulsive disorder)    Raynaud disease    Raynaud's disease    Umbilical hernia    Vertigo    Vertigo     Past Surgical History:  Procedure Laterality Date   BLADDER SURGERY     age 24- 19   CERVICAL FUSION     08/1998, 03/10/2017   CESAREAN  SECTION     COLONOSCOPY     COMBINED HYSTEROSCOPY DIAGNOSTIC / D&C     HAND SURGERY     2012, 2013   LAPAROSCOPIC ENDOMETRIOSIS FULGURATION Right    LESION REMOVAL  08/23/2012   Procedure: LESION REMOVAL HAND;  Surgeon: Wyn Forster., MD;  Location: [facility];  Service: Orthopedics;  Laterality: Left;   ROTATOR CUFF REPAIR     UPPER GASTROINTESTINAL ENDOSCOPY      Current Medications: Current Outpatient Medications on File Prior to Visit  Medication Sig Dispense Refill   albuterol (VENTOLIN HFA) 108 (90 Base) MCG/ACT inhaler INHALE 2 PUFFS BY MOUTH EVERY 4 HOURS AS NEEDED FOR WHEEZING FOR SHORTNESS OF BREATH 18 g 0   ALPRAZolam (XANAX) 1 MG tablet Take 1 tablet (1 mg total) by mouth 2 (two) times daily as needed. 20 tablet 2   Alum Hydroxide-Mag Trisilicate (GAVISCON) 80-14.2 MG CHEW Chew 2 tablets by mouth as needed (acid reflux).     aspirin EC 81 MG tablet Take 81 mg by mouth daily. Swallow whole.  atenolol (TENORMIN) 25 MG tablet TAKE 1 TABLET BY MOUTH ONCE DAILY AS NEEDED (Patient taking differently: Take 12.5 mg by mouth daily.) 90 tablet 3   AUVI-Q 0.3 MG/0.3ML SOAJ injection Inject 0.3 mg into the muscle as needed for anaphylaxis. As directed for life-threatening allergic reactions 2 each 1   azelastine (ASTELIN) 0.1 % nasal spray Place 1 spray into both nostrils 2 (two) times daily as needed for rhinitis. Use in each nostril as directed     benzonatate (TESSALON) 100 MG capsule Take 1 capsule (100 mg total) by mouth 3 (three) times daily as needed for cough. 90 capsule 1   budesonide (PULMICORT) 0.25 MG/2ML nebulizer solution Take 0.25 mg by nebulization 2 (two) times daily as needed (Asthma).     Cholecalciferol (VITAMIN D) 50 MCG (2000 UT) CAPS Take 2,000 Units by mouth daily.     Cranberry-Vitamin C (AZO CRANBERRY URINARY TRACT) 250-60 MG CAPS Take 1 tablet by mouth daily as needed (urinary pain).     diazepam (VALIUM) 10 MG tablet Take 10 mg by mouth  every 6 (six) hours as needed for anxiety or sleep (vertigo/muscle spasms).     diphenhydrAMINE (BENADRYL) 12.5 MG chewable tablet Chew 25 mg by mouth 4 (four) times daily as needed for allergies.     EPINEPHrine (EPIPEN 2-PAK) 0.3 mg/0.3 mL IJ SOAJ injection INJECT CONTENTS OF 1 PEN AS NEEDED FOR ALLERGIC REACTION 2 each 2   EPINEPHrine (PRIMATENE MIST) 0.125 MG/ACT AERO Inhale 1 puff into the lungs as needed (Asthma).     estradiol (ESTRACE) 0.1 MG/GM vaginal cream Place 1 Applicatorful vaginally once a week.     famotidine (PEPCID AC MAXIMUM STRENGTH) 20 MG tablet Take 20 mg by mouth daily as needed (Hives/itching).     guaifenesin (HUMIBID E) 400 MG TABS tablet Take 400-800 mg by mouth every 4 (four) hours as needed (Cough/mucus).     ipratropium (ATROVENT) 0.03 % nasal spray Place 1 spray into the nose 2 (two) times daily as needed for rhinitis.     levalbuterol (XOPENEX HFA) 45 MCG/ACT inhaler INHALE 2 PUFFS BY MOUTH EVERY 4 HOURS AS NEEDED FOR WHEEZING 15 g 0   magnesium oxide (MAG-OX) 400 (240 Mg) MG tablet Take 400 mg by mouth daily.     Meth-Hyo-M Bl-Na Phos-Ph Sal (URIBEL) 118 MG CAPS Take 1 capsule (118 mg total) by mouth every 6 (six) hours as needed. 30 capsule 1   Methenamine-Sodium Salicylate (CYSTEX URINARY PAIN RELIEF) 162-162.5 MG TABS Take 1-2 tablets by mouth 3 (three) times daily as needed (urinary pain).     methylphenidate (RITALIN) 10 MG tablet Take 1 tablet (10 mg total) by mouth 3 (three) times daily with meals. 90 tablet 0   Olopatadine HCl (PATADAY) 0.2 % SOLN Place 1 drop into both eyes 2 (two) times daily as needed (eye allergies).     rosuvastatin (CRESTOR) 10 MG tablet Take 1 tablet by mouth once daily 90 tablet 3   SUMAtriptan (IMITREX) 50 MG tablet Take 1 tablet by mouth once as needed. May repeat in 2 hours if headache persists or recurs. 10 tablet 3   No current facility-administered medications on file prior to visit.     Allergies:   Acetaminophen, Airduo  digihaler [fluticasone-salmeterol], Allegra [fexofenadine], Aspirin, B-12 [cyanocobalamin], Baclofen, Bactrim, Bee venom, Benadryl [diphenhydramine], Ciprofloxacin, Clindamycin, Coconut (cocos nucifera), Coq-10 [coenzyme q10], Cymbalta [duloxetine hcl], Darvon [propoxyphene], Flunisolide, Formaldehyde, Hydrocodone-acetaminophen, Nucynta [tapentadol], Other, Peanut-containing drug products, Potassium-containing compounds, Ramipril, Skelaxin [metaxalone], Sulfa antibiotics, Sulfamethoxazole-trimethoprim,  Tezspire [tezepelumab], Triamcinolone, Voltaren [diclofenac], Zofran [ondansetron hcl], Aspartame, Bacitra-neomycin-polymyxin-hc, Cranberry extract, Sulfur, Aciphex [rabeprazole], Addyi [flibanserin], Alvesco [ciclesonide], Ambien [zolpidem], Amoxicillin, Ativan [lorazepam], Aygestin [norethindrone], Celebrex [celecoxib], Demeclocycline, Dexilant [dexlansoprazole], Doxycycline, Effexor xr [venlafaxine hcl er], Elavil [amitriptyline], Erythromycin, Erythromycin base, Fentanyl, Fioricet [butalbital-apap-caffeine], Hydrocodone, Hydrocodone bit-homatrop mbr, Hydromorphone, Ibuprofen, Inderal [propranolol], Indomethacin, Latex, Levofloxacin, Lyrica [pregabalin], Maxalt [rizatriptan], Meloxicam, Morphine sulfate er, Naproxen, Neurontin [gabapentin], Nexium [esomeprazole], Nortriptyline, Omeprazole-sodium bicarbonate, Ranitidine, Robaxin [methocarbamol], Tape, Tetanus toxoid, Tetanus-diphtheria toxoids td, Tetracyclines & related, Tizanidine, Topamax [topiramate], Trazodone, Tums [calcium carbonate], Wound dressing adhesive, Etodolac, Flexeril [cyclobenzaprine], and Neosporin [neomycin-bacitracin zn-polymyx]   Social History   Socioeconomic History   Marital status: Married    Spouse name: Not on file   Number of children: 1   Years of education: Not on file   Highest education level: Associate degree: academic program  Occupational History   Not on file  Tobacco Use   Smoking status: Never   Smokeless tobacco:  Never  Vaping Use   Vaping Use: Never used  Substance and Sexual Activity   Alcohol use: Yes    Comment: occasional   Drug use: No   Sexual activity: Yes    Birth control/protection: I.U.D.  Other Topics Concern   Not on file  Social History Narrative   Not on file   Social Determinants of Health   Financial Resource Strain: Low Risk  (12/02/2022)   Overall Financial Resource Strain (CARDIA)    Difficulty of Paying Living Expenses: Not hard at all  Food Insecurity: No Food Insecurity (12/02/2022)   Hunger Vital Sign    Worried About Running Out of Food in the Last Year: Never true    Ran Out of Food in the Last Year: Never true  Transportation Needs: No Transportation Needs (12/02/2022)   PRAPARE - Administrator, Civil Service (Medical): No    Lack of Transportation (Non-Medical): No  Physical Activity: Sufficiently Active (12/02/2022)   Exercise Vital Sign    Days of Exercise per Week: 4 days    Minutes of Exercise per Session: 40 min  Stress: No Stress Concern Present (12/02/2022)   [employer] of Occupational Health - Occupational Stress Questionnaire    Feeling of Stress : Not at all  Social Connections: Moderately Integrated (12/02/2022)   Social Connection and Isolation Panel [NHANES]    Frequency of Communication with Friends and Family: More than three times a week    Frequency of Social Gatherings with Friends and Family: Once a week    Attends Religious Services: Never    Database administrator or Organizations: Yes    Attends Engineer, structural: More than 4 times per year    Marital Status: Married     Family History: The patient's family history includes ADD / ADHD in her brother; Stomach cancer in her maternal grandfather. There is no history of Colon cancer, Esophageal cancer, or Rectal cancer.  ROS:   Please see the history of present illness.     All other systems reviewed and are negative.  EKGs/Labs/Other Studies Reviewed:     The following studies were reviewed today:   EKG:  EKG is  ordered today.  The ekg ordered today demonstrates   02/17/2023- NSR, poor r wave progression  Recent Labs: 09/28/2022: Magnesium 2.2 01/02/2023: ALT 20 02/13/2023: BUN 13; Creatinine, Ser 1.04; Hemoglobin 14.1; Platelets 238; Potassium 4.1; Sodium 140  Recent Lipid Panel     Component Value Date/Time   CHOL 180 07/23/2021  0914   TRIG 65.0 07/23/2021 0914   HDL 62.20 07/23/2021 0914   CHOLHDL 3 07/23/2021 0914   VLDL 13.0 07/23/2021 0914   LDLCALC 105 (H) 07/23/2021 0914     Risk Assessment/Calculations:    Physical Exam:    VS: Vitals:   02/17/23 1051  BP: 128/78  Pulse: 63  SpO2: 94%       BP 128/78 (BP Location: Right Arm, Patient Position: Sitting, Cuff Size: Normal)   Pulse 63   Ht 5\' 3"  (1.6 m)   Wt 172 lb 12.8 oz (78.4 kg)   LMP  (LMP Unknown)   SpO2 94%   BMI 30.61 kg/m     Wt Readings from Last 3 Encounters:  02/17/23 172 lb 12.8 oz (78.4 kg)  02/13/23 170 lb (77.1 kg)  01/02/23 171 lb 15.3 oz (78 kg)     GEN:  Well nourished, well developed in no acute distress HEENT: Normal LYMPHATICS: No lymphadenopathy CARDIAC: RRR, no murmurs, rubs, gallops RESPIRATORY:  Clear to auscultation without rales, wheezing or rhonchi  ABDOMEN: Soft, non-tender, non-distended MUSCULOSKELETAL:  No edema; No deformity  SKIN: Warm and dry NEUROLOGIC:  Alert and oriented x 3 PSYCHIATRIC:  Normal affect   ASSESSMENT:    Pre-OP EKG shows poor r wave progression. Low risk for CVD, can be lead placement. She can walk > 4 METS without significant shortness of breath or chest pressure. She is low risk for low risk surgery. She is acceptable cardiac risk for  BL retrograde pyelogram. .  PLAN:    In order of problems listed above:  No further cardiac w/u      Medication Adjustments/Labs and Tests Ordered: Current medicines are reviewed at length with the patient today.  Concerns regarding medicines are  outlined above.  Orders Placed This Encounter  Procedures   EKG 12-Lead   No orders of the defined types were placed in this encounter.   There are no Patient Instructions on file for this visit.   Signed, Samantha Fus, MD  02/17/2023 11:14 AM    [facility]

## 2023-02-20 ENCOUNTER — Ambulatory Visit: Payer: No Typology Code available for payment source | Admitting: Family Medicine

## 2023-02-20 ENCOUNTER — Other Ambulatory Visit (HOSPITAL_BASED_OUTPATIENT_CLINIC_OR_DEPARTMENT_OTHER): Payer: Self-pay

## 2023-02-20 ENCOUNTER — Encounter: Payer: Self-pay | Admitting: Family Medicine

## 2023-02-20 VITALS — BP 124/72 | HR 77 | Temp 97.9°F | Ht 63.0 in | Wt 171.2 lb

## 2023-02-20 DIAGNOSIS — I10 Essential (primary) hypertension: Secondary | ICD-10-CM | POA: Diagnosis not present

## 2023-02-20 DIAGNOSIS — N3010 Interstitial cystitis (chronic) without hematuria: Secondary | ICD-10-CM | POA: Diagnosis not present

## 2023-02-20 DIAGNOSIS — F411 Generalized anxiety disorder: Secondary | ICD-10-CM

## 2023-02-20 MED ORDER — DIAZEPAM 10 MG PO TABS
10.0000 mg | ORAL_TABLET | Freq: Four times a day (QID) | ORAL | 1 refills | Status: DC | PRN
Start: 2023-02-20 — End: 2023-04-05
  Filled 2023-02-20: qty 30, 8d supply, fill #0
  Filled 2023-03-27: qty 30, 8d supply, fill #1

## 2023-02-20 NOTE — Anesthesia Preprocedure Evaluation (Signed)
Anesthesia Evaluation  Patient identified by MRN, date of birth, ID band Patient awake    Reviewed: Allergy & Precautions, NPO status , Patient's Chart, lab work & pertinent test results  History of Anesthesia Complications (+) Family history of anesthesia reaction and history of anesthetic complications  Airway Mallampati: II  TM Distance: >3 FB Neck ROM: Full    Dental no notable dental hx. (+) Teeth Intact, Dental Advisory Given   Pulmonary asthma    Pulmonary exam normal breath sounds clear to auscultation       Cardiovascular hypertension, Normal cardiovascular exam Rhythm:Regular Rate:Normal     Neuro/Psych  Headaches PSYCHIATRIC DISORDERS Anxiety      Neuromuscular disease    GI/Hepatic ,GERD  ,,  Endo/Other    Renal/GU Renal diseaseLab Results      Component                Value               Date                      CREATININE               1.04 (H)            02/13/2023                BUN                      13                  02/13/2023                NA                       140                 02/13/2023                K                        4.1                 02/13/2023                     Musculoskeletal  (+) Arthritis ,  Fibromyalgia -  Abdominal   Peds  Hematology Lab Results      Component                Value               Date                      WBC                      7.1                 02/13/2023                HGB                      14.1                02/13/2023                HCT  42.3                02/13/2023                MCV                      90.6                02/13/2023                PLT                      238                 02/13/2023              Anesthesia Other Findings All: See list (82)  Reproductive/Obstetrics                              Anesthesia Physical Anesthesia Plan  ASA: 3  Anesthesia Plan:  General   Post-op Pain Management: Precedex   Induction: Intravenous  PONV Risk Score and Plan: Treatment may vary due to age or medical condition, Midazolam, Propofol infusion, TIVA and Ondansetron  Airway Management Planned: LMA and Oral ETT  Additional Equipment: None  Intra-op Plan:   Post-operative Plan: Extubation in OR  Informed Consent: I have reviewed the patients History and Physical, chart, labs and discussed the procedure including the risks, benefits and alternatives for the proposed anesthesia with the patient or authorized representative who has indicated his/her understanding and acceptance.     Dental advisory given  Plan Discussed with:   Anesthesia Plan Comments: (See PAT note from 6/3 by Sherlie Ban PA-C )         Anesthesia Quick Evaluation

## 2023-02-20 NOTE — Assessment & Plan Note (Signed)
Stable. Overall improved control of anxiety. Continue diazepam for baseline control and alprazolam as needed for panic attacks.

## 2023-02-20 NOTE — Progress Notes (Signed)
Case: [id number] Date/Time: 02/22/23 0915   Procedures:      CYSTOSCOPY WITH BILATERAL RETROGRADE PYELOGRAM/POSSIBLE URETEROSCOPY - 60 MINUTES NEEDED FOR CASE     HYDRODISTENSION     URETHRAL DILATATION   Anesthesia type: General   Pre-op diagnosis: HEMATURIA, INTERSTITIAL CYSTITIS   Location: [facility] PROC[facility] / [facility]   Surgeons: Despina Arias, MD       DISCUSSION: Samantha [name] is a 52 yo female who presents to PAT prior to surgery listed above. She is having a retrograde pyelogram done due to hematuria. She does not have any kidney stones seen on imaging. PMH significant for GERD, HTN, asthma, anxiety, OCD, fibromyalgia, interstitial cystitis, CKD  Prior complication from anesthesia includes "waking up before procedure was over". Of note patient has 82 allergies listed. During her last anesthesia event for a removal of a lesion on her hand she received Versed, Fentanyl, Lidocaine, Zofran, Propofol and no adverse events were documented.  Screening EKG at her PAT visit showed inverted T waves in leads II and avF therefore she was referred to Cardiology for clearance. She saw them on 6/7 and per Dr. Wyline Mood: "EKG shows poor r wave progression. Low risk for CVD, can be lead placement. She can walk > 4 METS without significant shortness of breath or chest pressure. She is low risk for low risk surgery. She is acceptable cardiac risk for  BL retrograde pyelogram."   Patient follows closely with her PCP for her medical issues. Last seen 02/20/23.  Anticipate patient can proceed barring any acute change.  VS: BP 129/78   Pulse (!) 57   Temp 36.9 C (Oral)   Resp 16   Ht 5\' 3"  (1.6 m)   Wt 77.1 kg   LMP  (LMP Unknown)   SpO2 100%   BMI 30.11 kg/m   PROVIDERS: Loyola Mast, MD   LABS: Labs reviewed: Acceptable for surgery. (all labs ordered are listed, but only abnormal results are displayed)  Labs Reviewed  BASIC METABOLIC PANEL - Abnormal; Notable for the  following components:      Result Value   Creatinine, Ser 1.04 (*)    All other components within normal limits  SURGICAL PCR SCREEN  HEMOGLOBIN A1C  CBC  GLUCOSE, CAPILLARY     IMAGES:  CT Renal 01/02/23:  IMPRESSION: 1. No renal stones or obstructive uropathy. No explanation for hematuria. 2. No acute abnormality in the abdomen/pelvis. 3. Minimal colonic diverticulosis without diverticulitis.   Aortic Atherosclerosis (ICD10-I70.0).   EKG 02/17/23:  NSR, poor r wave progression    CV: n/a  Past Medical History:  Diagnosis Date   ADD (attention deficit disorder)    Allergy    Anaphylaxis    Anemia    Anxiety    Arthritis    Asthma    Chronic back pain    Chronic kidney disease    Chronic neck pain    Complication of anesthesia    Waking up before procedure was over.   Degenerative disc disease at L5-S1 level    Dysrhythmia    Had  "abnormal beats" d/t B/P med she was on. B/P med reduced.   Endometriosis    Familial cold urticaria    Family history of adverse reaction to anesthesia    Fibromyalgia    GERD (gastroesophageal reflux disease)    Headache    Migraines   Hernia    Hyperlipidemia    Hypersomnia    Hypertension    IBS (  irritable bowel syndrome)    IC (interstitial cystitis)    IgG Gliadin antibody positive    Low blood potassium    Migraines    Multiple allergies    OCD (obsessive compulsive disorder)    Raynaud disease    Raynaud's disease    Umbilical hernia    Vertigo    Vertigo     Past Surgical History:  Procedure Laterality Date   BLADDER SURGERY     age 43- 87   CERVICAL FUSION     08/1998, 03/10/2017   CESAREAN SECTION     COLONOSCOPY     COMBINED HYSTEROSCOPY DIAGNOSTIC / D&C     HAND SURGERY     2012, 2013   LAPAROSCOPIC ENDOMETRIOSIS FULGURATION Right    LESION REMOVAL  08/23/2012   Procedure: LESION REMOVAL HAND;  Surgeon: Wyn Forster., MD;  Location: [facility];  Service: Orthopedics;   Laterality: Left;   ROTATOR CUFF REPAIR     UPPER GASTROINTESTINAL ENDOSCOPY      MEDICATIONS:  albuterol (VENTOLIN HFA) 108 (90 Base) MCG/ACT inhaler   ALPRAZolam (XANAX) 1 MG tablet   Alum Hydroxide-Mag Trisilicate (GAVISCON) 80-14.2 MG CHEW   aspirin EC 81 MG tablet   atenolol (TENORMIN) 25 MG tablet   AUVI-Q 0.3 MG/0.3ML SOAJ injection   azelastine (ASTELIN) 0.1 % nasal spray   benzonatate (TESSALON) 100 MG capsule   budesonide (PULMICORT) 0.25 MG/2ML nebulizer solution   Cholecalciferol (VITAMIN D) 50 MCG (2000 UT) CAPS   cimetidine (TAGAMET) 200 MG tablet   Cranberry-Vitamin C (AZO CRANBERRY URINARY TRACT) 250-60 MG CAPS   diazepam (VALIUM) 10 MG tablet   diphenhydrAMINE (BENADRYL) 12.5 MG chewable tablet   EPINEPHrine (EPIPEN 2-PAK) 0.3 mg/0.3 mL IJ SOAJ injection   EPINEPHrine (PRIMATENE MIST) 0.125 MG/ACT AERO   estradiol (ESTRACE) 0.1 MG/GM vaginal cream   famotidine (PEPCID AC MAXIMUM STRENGTH) 20 MG tablet   guaifenesin (HUMIBID E) 400 MG TABS tablet   ipratropium (ATROVENT) 0.03 % nasal spray   levalbuterol (XOPENEX HFA) 45 MCG/ACT inhaler   magnesium oxide (MAG-OX) 400 (240 Mg) MG tablet   Meth-Hyo-M Bl-Na Phos-Ph Sal (URIBEL) 118 MG CAPS   Methenamine-Sodium Salicylate (CYSTEX URINARY PAIN RELIEF) 162-162.5 MG TABS   methylphenidate (RITALIN) 10 MG tablet   Olopatadine HCl (PATADAY) 0.2 % SOLN   rosuvastatin (CRESTOR) 10 MG tablet   SUMAtriptan (IMITREX) 50 MG tablet   No current facility-administered medications for this encounter.   Marcille Blanco [facility] Stay/Anesthesiology [facility] Phone [phone] 02/20/2023 10:14 AM

## 2023-02-20 NOTE — Progress Notes (Signed)
[facility] Trecia Rogers [facility] [address] [place] [address] Dept: [phone] Dept Fax: [phone]  Chronic Care Office Visit  Subjective:    Patient ID: Samantha [name], female    DOB: [birth year], 52 y.o..   MRN: [id number]  Chief Complaint  Patient presents with   Medical Management of Chronic Issues    2 month f/u.   C/o having bilateral feet swelling/pain.    History of Present Illness:  Patient is in today for reassessment of chronic medical issues.  Samantha [name] has a history of hypertension and is managed on atenolol 25 mg 1/2 tab daily.   Samantha [name] has a history of asthma and allergy symptoms. She is followed by Dr. Darcey Nora (allergist).  She notes that currently, her symptoms appear to be in better control. She is using some azelastine spray along with her Atrovent spray to control rhinorrhea. She is not having any current wheezing.   Samantha [name] has multiple psychiatric issues including anxiety, PTSD, panic disorder, and ADHD. She is managed on Valium for baseline control of her anxiety and management of insomnia, and Xanax for breakthrough panic issues or acute anxiety. She is on methylphenidate TID for management of her ADHD and daytime hypersomnia. 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 She thinks the COVID pandemic had been a trigger in the past. She continues to work closely with her 75 yo son who has autism.  Samantha [name] was recently seen by urology related to interstitial cystitis, urethral stricture, and hematuria. She is scheduled for cystoscopy, urethral dilation, and  bladder hidrodistension for Wed. As part of her pre-op evaluation, she had an EKG that showed some new T-wave inversions. She was seen by cardiology and is felt to be low risk.  Past Medical History: Patient Active Problem List   Diagnosis Date Noted   Cervical spine arthritis 02/25/2022   Leg swelling  10/19/2021   Interstitial cystitis 09/15/2021   Other urethral stricture, female 09/15/2021   Chronic kidney disease, stage 3a (HCC) 08/20/2021   Prediabetes 08/27/2020   History of [place] spotted fever 02/04/2020   Moderate persistent asthma without complication 08/02/2019   LPRD (laryngopharyngeal reflux disease) 08/02/2019   Chronic rhinitis 08/02/2019   Chronic post-traumatic stress disorder (PTSD) 11/21/2018   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) 11/21/2018   Pulmonary nodules 11/13/2018   Other microscopic hematuria 11/06/2018   Generalized anxiety disorder 07/04/2018   Fecal incontinence 07/02/2018   Leg weakness 06/12/2017   Left ear hearing loss 02/07/2017   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   Borderline hyperlipidemia 11/08/2016   Acute idiopathic gout of multiple sites 11/08/2016   Elbow pain, chronic, right 10/06/2016   Lateral epicondylitis, right elbow 10/06/2016   Fibromyalgia 10/04/2016   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   Migraine headache 07/29/2016   Irritable bowel syndrome with both constipation and diarrhea 07/29/2016   Hives 07/18/2016   Urinary incontinence 06/02/2016   Atherosclerosis of abdominal aorta (HCC) 09/03/2015   Carpal tunnel syndrome 11/19/2014   Primary hypersomnia 11/18/2014   Insomnia 11/18/2014   Lumbar radicular pain 11/18/2014   Chronic pain syndrome 11/18/2014   Cervical disc disorder with radiculopathy 11/18/2014   Chronic female pelvic pain 07/31/2014   Degeneration of intervertebral disc of lumbosacral region 07/31/2014   Rosacea 07/31/2014   Morton's neuroma 07/31/2014   Benign paroxysmal positional vertigo 07/31/2014   Essential hypertension 07/31/2014   History of methicillin resistant  Staphylococcus aureus infection 07/09/2014   Past Surgical History:  Procedure Laterality Date   BLADDER SURGERY     age 75- 30   CERVICAL FUSION     08/1998, 03/10/2017    CESAREAN SECTION     COLONOSCOPY     COMBINED HYSTEROSCOPY DIAGNOSTIC / D&C     HAND SURGERY     2012, 2013   LAPAROSCOPIC ENDOMETRIOSIS FULGURATION Right    LESION REMOVAL  08/23/2012   Procedure: LESION REMOVAL HAND;  Surgeon: Wyn Forster., MD;  Location: [facility];  Service: Orthopedics;  Laterality: Left;   ROTATOR CUFF REPAIR     UPPER GASTROINTESTINAL ENDOSCOPY     Family History  Problem Relation Age of Onset   ADD / ADHD Brother    Stomach cancer Maternal Grandfather    Colon cancer Neg Hx    Esophageal cancer Neg Hx    Rectal cancer Neg Hx    Outpatient Medications Prior to Visit  Medication Sig Dispense Refill   albuterol (VENTOLIN HFA) 108 (90 Base) MCG/ACT inhaler INHALE 2 PUFFS BY MOUTH EVERY 4 HOURS AS NEEDED FOR WHEEZING FOR SHORTNESS OF BREATH 18 g 0   ALPRAZolam (XANAX) 1 MG tablet Take 1 tablet (1 mg total) by mouth 2 (two) times daily as needed. 20 tablet 2   Alum Hydroxide-Mag Trisilicate (GAVISCON) 80-14.2 MG CHEW Chew 2 tablets by mouth as needed (acid reflux).     aspirin EC 81 MG tablet Take 81 mg by mouth daily. Swallow whole.     atenolol (TENORMIN) 25 MG tablet TAKE 1 TABLET BY MOUTH ONCE DAILY AS NEEDED (Patient taking differently: Take 12.5 mg by mouth daily.) 90 tablet 3   AUVI-Q 0.3 MG/0.3ML SOAJ injection Inject 0.3 mg into the muscle as needed for anaphylaxis. As directed for life-threatening allergic reactions 2 each 1   azelastine (ASTELIN) 0.1 % nasal spray Place 1 spray into both nostrils 2 (two) times daily as needed for rhinitis. Use in each nostril as directed     benzonatate (TESSALON) 100 MG capsule Take 1 capsule (100 mg total) by mouth 3 (three) times daily as needed for cough. 90 capsule 1   budesonide (PULMICORT) 0.25 MG/2ML nebulizer solution Take 0.25 mg by nebulization 2 (two) times daily as needed (Asthma).     Cholecalciferol (VITAMIN D) 50 MCG (2000 UT) CAPS Take 2,000 Units by mouth daily.     cimetidine  (TAGAMET) 200 MG tablet Take 200 mg by mouth 2 (two) times daily.     diphenhydrAMINE (BENADRYL) 12.5 MG chewable tablet Chew 25 mg by mouth 4 (four) times daily as needed for allergies.     EPINEPHrine (EPIPEN 2-PAK) 0.3 mg/0.3 mL IJ SOAJ injection INJECT CONTENTS OF 1 PEN AS NEEDED FOR ALLERGIC REACTION 2 each 2   EPINEPHrine (PRIMATENE MIST) 0.125 MG/ACT AERO Inhale 1 puff into the lungs as needed (Asthma).     estradiol (ESTRACE) 0.1 MG/GM vaginal cream Place 1 Applicatorful vaginally once a week.     famotidine (PEPCID AC MAXIMUM STRENGTH) 20 MG tablet Take 20 mg by mouth daily as needed (Hives/itching).     guaifenesin (HUMIBID E) 400 MG TABS tablet Take 400-800 mg by mouth every 4 (four) hours as needed (Cough/mucus).     ipratropium (ATROVENT) 0.03 % nasal spray Place 1 spray into the nose 2 (two) times daily as needed for rhinitis.     levalbuterol (XOPENEX HFA) 45 MCG/ACT inhaler INHALE 2 PUFFS BY MOUTH EVERY 4 HOURS  AS NEEDED FOR WHEEZING 15 g 0   magnesium oxide (MAG-OX) 400 (240 Mg) MG tablet Take 400 mg by mouth daily.     methylphenidate (RITALIN) 10 MG tablet Take 1 tablet (10 mg total) by mouth 3 (three) times daily with meals. 90 tablet 0   Olopatadine HCl (PATADAY) 0.2 % SOLN Place 1 drop into both eyes 2 (two) times daily as needed (eye allergies).     rosuvastatin (CRESTOR) 10 MG tablet Take 1 tablet by mouth once daily 90 tablet 3   SUMAtriptan (IMITREX) 50 MG tablet Take 1 tablet by mouth once as needed. May repeat in 2 hours if headache persists or recurs. 10 tablet 3   diazepam (VALIUM) 10 MG tablet Take 10 mg by mouth every 6 (six) hours as needed for anxiety or sleep (vertigo/muscle spasms).     Cranberry-Vitamin C (AZO CRANBERRY URINARY TRACT) 250-60 MG CAPS Take 1 tablet by mouth daily as needed (urinary pain). (Patient not taking: Reported on 02/20/2023)     Meth-Hyo-M Bl-Na Phos-Ph Sal (URIBEL) 118 MG CAPS Take 1 capsule (118 mg total) by mouth every 6 (six) hours as  needed. (Patient not taking: Reported on 02/20/2023) 30 capsule 1   Methenamine-Sodium Salicylate (CYSTEX URINARY PAIN RELIEF) 162-162.5 MG TABS Take 1-2 tablets by mouth 3 (three) times daily as needed (urinary pain). (Patient not taking: Reported on 02/20/2023)     No facility-administered medications prior to visit.   Allergies  Allergen Reactions   Acetaminophen Rash    Blistering lymph nodes, and blistering rash    Airduo Digihaler [Fluticasone-Salmeterol] Hives, Shortness Of Breath and Itching    Asthma attack   Allegra [Fexofenadine] Cough    She reports tachycardia and elevated BP and low Oxygen level after taking this   Aspirin Shortness Of Breath    High dose brings asthma attack    B-12 [Cyanocobalamin] Shortness Of Breath and Itching    Drop O2   Baclofen Other (See Comments)    Headache, muscle weakness, unable to urinate, unable to walk    Bactrim Anaphylaxis   Bee Venom Hives, Itching, Other (See Comments) and Rash   Benadryl [Diphenhydramine] Anaphylaxis    Only the liquid form she can not take because it causes throat closing,  DO NOT USE IN IV FORM - tablets are ok   Ciprofloxacin Shortness Of Breath    Blurred Vision, joint pain, headaches, dropped BP   Clindamycin Shortness Of Breath   Coconut (Cocos Nucifera) Hives, Itching and Other (See Comments)    Coconut Oil    Coq-10 [Coenzyme Q10] Hives, Shortness Of Breath and Itching   Cymbalta [Duloxetine Hcl] Anaphylaxis and Shortness Of Breath   Darvon [Propoxyphene] Hives, Shortness Of Breath and Itching    Darvocet*   Flunisolide Shortness Of Breath and Other (See Comments)    Aerospan Nasal Inhaler* - caused blood clots in nose    Formaldehyde Anaphylaxis   Hydrocodone-Acetaminophen Hives, Shortness Of Breath and Itching    Vicodin, Norco   Nucynta [Tapentadol] Shortness Of Breath    Coughing, SOB    Other Hives, Itching, Rash and Other (See Comments)    Insect stings/ Bites  Wine spirit  Peanuts - Bring  on Asthma attack   Cold Temperatures  - causes hives, and muscle spasms    Peanut-Containing Drug Products Hives, Shortness Of Breath, Itching, Swelling and Other (See Comments)   Potassium-Containing Compounds Anaphylaxis, Itching, Swelling and Palpitations    Tables, and Oral Solution  Ramipril Anaphylaxis   Skelaxin [Metaxalone] Hives, Shortness Of Breath and Itching    Brings on Asthma Attacks    Sulfa Antibiotics Anaphylaxis   Sulfamethoxazole-Trimethoprim Anaphylaxis   Tezspire [Tezepelumab] Shortness Of Breath, Nausea And Vomiting, Swelling, Rash and Other (See Comments)    Muscle weakness, joint pain, sore throat   Triamcinolone Other (See Comments)    Kenalog Injection - muscle weakness, spasms, BP drop, lethargic, tongue swelling   Nasacort causes breathing issues    Voltaren [Diclofenac] Shortness Of Breath, Swelling and Rash    Gel*    Zofran [Ondansetron Hcl] Hives and Itching    Blurred Vision    Aspartame Other (See Comments)    Severe Migraines   Bacitra-Neomycin-Polymyxin-Hc Rash   Cranberry Extract Other (See Comments)    Throat itching / burning    Sulfur Hives and Itching   Aciphex [Rabeprazole]     Muscle weakness, headaches    Addyi [Flibanserin]     Decreased sex drive to 0   Alvesco [Ciclesonide] Other (See Comments)    Sores in mouth, coughing    Ambien [Zolpidem]     Mood Change, Aggressive, Insomnia    Amoxicillin     Pt unsure of Allergy    Ativan [Lorazepam] Other (See Comments)    Hallucination     Aygestin [Norethindrone]     Muscle weakness, abdominal pain   Celebrex [Celecoxib] Other (See Comments)    Possible Reaction - Not taken    Demeclocycline     Tetracycline Family-  GI Upset   Dexilant [Dexlansoprazole]     Severe joint pain    Doxycycline     Possibly joint pain or itch    Effexor Xr [Venlafaxine Hcl Er]     Migraines, weight gain, trouble sleeping   Elavil [Amitriptyline] Nausea And Vomiting    Headache, and muscle  weakness   Erythromycin     Upset stomach   Erythromycin Base     Stomach Upset    Fentanyl Dermatitis    Patches*    Fioricet [Butalbital-Apap-Caffeine]     Acetaminophen    Hydrocodone Itching   Hydrocodone Bit-Homatrop Mbr Hives and Itching    Not true allergy. "irritated"   Hydromorphone Nausea Only   Ibuprofen Itching    Throat itching    Inderal [Propranolol]     Agitation, difficulty sleeping    Indomethacin Hives and Itching   Latex Itching    Skin Peeling    Levofloxacin     Weakness, myalgia   Lyrica [Pregabalin]     Muscle weakness, Causes Falls    Maxalt [Rizatriptan] Other (See Comments)    Pt was knocked out from med, extreme drowsiness and did not help migraine    Meloxicam Itching    Increases BP   Morphine Sulfate Er     Severe insomnia, does not help pain    Naproxen Hives and Itching    Head to Toe    Neurontin [Gabapentin]     Depression    Nexium [Esomeprazole]     Bad headaches    Nortriptyline     Depression, decreased sex drive    Omeprazole-Sodium Bicarbonate     Chest pain-sore all over-HA   Ranitidine     Severe joint pain   Robaxin [Methocarbamol]     Made her very tired and unable to sleep at night Muscle weakness, fatigue    Tape Dermatitis   Tetanus Toxoid Other (See Comments)    Tetanus injection - blistering  rash, swelling at injection site, turn into baseball size lump, joint pain    Tetanus-Diphtheria Toxoids Td Hives    Robust local reaction   Tetracyclines & Related     GI Upset    Tizanidine Other (See Comments)    "knocked her out"- had a rebound migraine, causes muscles to lock, and bedridden    Topamax [Topiramate]     Lethargic, daytime drowsiness, insomnia    Trazodone     Pt thinks it did not help - unsure of reaction    Tums [Calcium Carbonate] Other (See Comments)    Blistering Rash with high doses    Wound Dressing Adhesive Itching and Dermatitis    Bandages    Etodolac Rash    Blistering Rash, and swollen  lymph nodes    Flexeril [Cyclobenzaprine] Palpitations    Irregular heartbeat    Neosporin [Neomycin-Bacitracin Zn-Polymyx] Rash    Blistering    Objective:   Today's Vitals   02/20/23 0944  BP: 124/72  Pulse: 77  Temp: 97.9 F (36.6 C)  TempSrc: Temporal  SpO2: 97%  Weight: 171 lb 3.2 oz (77.7 kg)  Height: 5\' 3"  (1.6 m)   Body mass index is 30.33 kg/m.   General: Well developed, well nourished. No acute distress. Psych: Alert and oriented. Normal mood and affect.  Health Maintenance Due  Topic Date Due   HIV Screening  Never done     Assessment & Plan:   Problem List Items Addressed This Visit       Cardiovascular and Mediastinum   Essential hypertension - Primary    Blood pressure is in good control. Continue atenolol 25 mg 1/2 tab daily.        Genitourinary   Interstitial cystitis    Cystoscopy planned for later this week. Continue to follow with urology.        Other   Generalized anxiety disorder    Stable. Overall improved control of anxiety. Continue diazepam for baseline control and alprazolam as needed for panic attacks.      Relevant Medications   diazepam (VALIUM) 10 MG tablet    Return in about 2 months (around 04/22/2023) for Reassessment.   Loyola Mast, MD

## 2023-02-20 NOTE — Assessment & Plan Note (Signed)
Cystoscopy planned for later this week. Continue to follow with urology.

## 2023-02-20 NOTE — Assessment & Plan Note (Signed)
Blood pressure is in good control. Continue atenolol 25 mg 1/2 tab daily. 

## 2023-02-22 ENCOUNTER — Other Ambulatory Visit (HOSPITAL_BASED_OUTPATIENT_CLINIC_OR_DEPARTMENT_OTHER): Payer: Self-pay

## 2023-02-22 ENCOUNTER — Ambulatory Visit (HOSPITAL_COMMUNITY): Payer: No Typology Code available for payment source | Admitting: Physician Assistant

## 2023-02-22 ENCOUNTER — Encounter (HOSPITAL_COMMUNITY)
Admission: RE | Disposition: A | Discharge status: Home / Self Care | Payer: Self-pay | Source: Home / Self Care | Attending: Urology

## 2023-02-22 ENCOUNTER — Other Ambulatory Visit: Payer: Self-pay

## 2023-02-22 ENCOUNTER — Encounter (HOSPITAL_COMMUNITY): Payer: Self-pay | Admitting: Urology

## 2023-02-22 ENCOUNTER — Ambulatory Visit (HOSPITAL_COMMUNITY)
Admission: RE | Admit: 2023-02-22 | Discharge: 2023-02-22 | Disposition: A | Discharge status: Home / Self Care | Payer: PRIVATE HEALTH INSURANCE | Attending: Urology | Admitting: Urology

## 2023-02-22 ENCOUNTER — Ambulatory Visit (HOSPITAL_COMMUNITY): Payer: PRIVATE HEALTH INSURANCE

## 2023-02-22 ENCOUNTER — Ambulatory Visit (HOSPITAL_BASED_OUTPATIENT_CLINIC_OR_DEPARTMENT_OTHER): Payer: No Typology Code available for payment source | Admitting: Anesthesiology

## 2023-02-22 DIAGNOSIS — F419 Anxiety disorder, unspecified: Secondary | ICD-10-CM | POA: Insufficient documentation

## 2023-02-22 DIAGNOSIS — Z79899 Other long term (current) drug therapy: Secondary | ICD-10-CM | POA: Insufficient documentation

## 2023-02-22 DIAGNOSIS — M797 Fibromyalgia: Secondary | ICD-10-CM | POA: Insufficient documentation

## 2023-02-22 DIAGNOSIS — N3011 Interstitial cystitis (chronic) with hematuria: Secondary | ICD-10-CM | POA: Diagnosis present

## 2023-02-22 DIAGNOSIS — R102 Pelvic and perineal pain: Secondary | ICD-10-CM | POA: Insufficient documentation

## 2023-02-22 DIAGNOSIS — J45909 Unspecified asthma, uncomplicated: Secondary | ICD-10-CM | POA: Diagnosis not present

## 2023-02-22 DIAGNOSIS — I129 Hypertensive chronic kidney disease with stage 1 through stage 4 chronic kidney disease, or unspecified chronic kidney disease: Secondary | ICD-10-CM | POA: Diagnosis not present

## 2023-02-22 DIAGNOSIS — Z7982 Long term (current) use of aspirin: Secondary | ICD-10-CM | POA: Diagnosis not present

## 2023-02-22 DIAGNOSIS — K219 Gastro-esophageal reflux disease without esophagitis: Secondary | ICD-10-CM | POA: Diagnosis not present

## 2023-02-22 DIAGNOSIS — M1990 Unspecified osteoarthritis, unspecified site: Secondary | ICD-10-CM | POA: Diagnosis not present

## 2023-02-22 DIAGNOSIS — R519 Headache, unspecified: Secondary | ICD-10-CM | POA: Diagnosis not present

## 2023-02-22 DIAGNOSIS — I10 Essential (primary) hypertension: Secondary | ICD-10-CM

## 2023-02-22 DIAGNOSIS — N189 Chronic kidney disease, unspecified: Secondary | ICD-10-CM | POA: Diagnosis not present

## 2023-02-22 DIAGNOSIS — R7303 Prediabetes: Secondary | ICD-10-CM

## 2023-02-22 HISTORY — PX: CYSTOSCOPY WITH URETHRAL DILATATION: SHX5125

## 2023-02-22 HISTORY — PX: CYSTO WITH HYDRODISTENSION: SHX5453

## 2023-02-22 HISTORY — PX: CYSTOSCOPY W/ RETROGRADES: SHX1426

## 2023-02-22 SURGERY — CYSTOSCOPY, WITH RETROGRADE PYELOGRAM
Anesthesia: General

## 2023-02-22 MED ORDER — PHENYLEPHRINE 80 MCG/ML (10ML) SYRINGE FOR IV PUSH (FOR BLOOD PRESSURE SUPPORT)
PREFILLED_SYRINGE | INTRAVENOUS | Status: AC
  Filled 2023-02-22: qty 10

## 2023-02-22 MED ORDER — CHLORHEXIDINE GLUCONATE 0.12 % MT SOLN
15.0000 mL | Freq: Once | OROMUCOSAL | Status: AC
  Administered 2023-02-22: 15 mL via OROMUCOSAL

## 2023-02-22 MED ORDER — EPHEDRINE 5 MG/ML INJ
INTRAVENOUS | Status: AC
  Filled 2023-02-22: qty 5

## 2023-02-22 MED ORDER — STERILE WATER FOR IRRIGATION IR SOLN
Status: DC | PRN
  Administered 2023-02-22: 3000 mL via INTRAVESICAL

## 2023-02-22 MED ORDER — FENTANYL CITRATE PF 50 MCG/ML IJ SOSY
PREFILLED_SYRINGE | INTRAMUSCULAR | Status: AC
  Filled 2023-02-22: qty 1

## 2023-02-22 MED ORDER — OXYCODONE HCL 5 MG PO TABS
5.0000 mg | ORAL_TABLET | Freq: Four times a day (QID) | ORAL | 0 refills | Status: DC | PRN
  Filled 2023-02-22: qty 12, 3d supply, fill #0

## 2023-02-22 MED ORDER — PROPOFOL 10 MG/ML IV BOLUS
INTRAVENOUS | Status: DC | PRN
  Administered 2023-02-22: 150 mg via INTRAVENOUS

## 2023-02-22 MED ORDER — IOHEXOL 300 MG/ML  SOLN
INTRAMUSCULAR | Status: DC | PRN
  Administered 2023-02-22: 10 mL via URETHRAL

## 2023-02-22 MED ORDER — ORAL CARE MOUTH RINSE: 15.0000 mL | Freq: Once | OROMUCOSAL | Status: DC

## 2023-02-22 MED ORDER — DEXMEDETOMIDINE HCL IN NACL 80 MCG/20ML IV SOLN
INTRAVENOUS | Status: DC | PRN
  Administered 2023-02-22: 12 ug via INTRAVENOUS

## 2023-02-22 MED ORDER — CEPHALEXIN 500 MG PO CAPS
500.0000 mg | ORAL_CAPSULE | Freq: Two times a day (BID) | ORAL | 0 refills | Status: DC
  Filled 2023-02-22: qty 6, 3d supply, fill #0

## 2023-02-22 MED ORDER — CEFAZOLIN SODIUM-DEXTROSE 2-4 GM/100ML-% IV SOLN
2.0000 g | Freq: Once | INTRAVENOUS | Status: DC
  Filled 2023-02-22: qty 100

## 2023-02-22 MED ORDER — FENTANYL CITRATE (PF) 100 MCG/2ML IJ SOLN
INTRAMUSCULAR | Status: AC
  Filled 2023-02-22: qty 2

## 2023-02-22 MED ORDER — LACTATED RINGERS IV SOLN: INTRAVENOUS | Status: DC

## 2023-02-22 MED ORDER — FENTANYL CITRATE (PF) 100 MCG/2ML IJ SOLN
INTRAMUSCULAR | Status: DC | PRN
  Administered 2023-02-22 (×2): 50 ug via INTRAVENOUS

## 2023-02-22 MED ORDER — PHENYLEPHRINE HCL (PRESSORS) 10 MG/ML IV SOLN
INTRAVENOUS | Status: DC | PRN
  Administered 2023-02-22: 160 ug via INTRAVENOUS

## 2023-02-22 MED ORDER — SODIUM CHLORIDE 0.9 % IR SOLN
Status: DC | PRN
  Administered 2023-02-22: 3000 mL via INTRAVESICAL

## 2023-02-22 MED ORDER — EPHEDRINE SULFATE (PRESSORS) 50 MG/ML IJ SOLN
INTRAMUSCULAR | Status: DC | PRN
  Administered 2023-02-22 (×2): 10 mg via INTRAVENOUS
  Administered 2023-02-22: 5 mg via INTRAVENOUS

## 2023-02-22 MED ORDER — PROPOFOL 10 MG/ML IV BOLUS
INTRAVENOUS | Status: AC
  Filled 2023-02-22: qty 20

## 2023-02-22 MED ORDER — MIDAZOLAM HCL 2 MG/2ML IJ SOLN
INTRAMUSCULAR | Status: AC
  Filled 2023-02-22: qty 2

## 2023-02-22 MED ORDER — MIDAZOLAM HCL 5 MG/5ML IJ SOLN
INTRAMUSCULAR | Status: DC | PRN
  Administered 2023-02-22: 2 mg via INTRAVENOUS

## 2023-02-22 MED ORDER — FENTANYL CITRATE PF 50 MCG/ML IJ SOSY
25.0000 ug | PREFILLED_SYRINGE | INTRAMUSCULAR | Status: DC | PRN
  Administered 2023-02-22: 50 ug via INTRAVENOUS

## 2023-02-22 SURGICAL SUPPLY — 32 items
BAG DRN RND TRDRP ANRFLXCHMBR (UROLOGICAL SUPPLIES) ×1
BAG URINE DRAIN 2000ML AR STRL (UROLOGICAL SUPPLIES) ×1 IMPLANT
BAG URO CATCHER STRL LF (MISCELLANEOUS) ×1 IMPLANT
BALLN NEPHROSTOMY (BALLOONS)
BALLOON NEPHROSTOMY (BALLOONS) IMPLANT
CATH FOLEY 2W COUNCIL 20FR 5CC (CATHETERS) IMPLANT
CATH ROBINSON RED A/P 14FR (CATHETERS) ×1 IMPLANT
CATH ROBINSON RED A/P 16FR (CATHETERS) IMPLANT
CATH URET 5FR 28IN CONE TIP (BALLOONS)
CATH URET 5FR 70CM CONE TIP (BALLOONS) IMPLANT
CATH URETL OPEN 5X70 (CATHETERS) ×1 IMPLANT
CLOTH BEACON ORANGE TIMEOUT ST (SAFETY) ×1 IMPLANT
GLOVE BIO SURGEON STRL SZ7 (GLOVE) ×1 IMPLANT
GLOVE BIO SURGEON STRL SZ7.5 (GLOVE) ×1 IMPLANT
GLOVE SURG LX STRL 7.5 STRW (GLOVE) ×1 IMPLANT
GOWN STRL REUS W/ TWL LRG LVL3 (GOWN DISPOSABLE) ×2 IMPLANT
GOWN STRL REUS W/ TWL XL LVL3 (GOWN DISPOSABLE) ×1 IMPLANT
GOWN STRL REUS W/TWL LRG LVL3 (GOWN DISPOSABLE) ×2
GOWN STRL REUS W/TWL XL LVL3 (GOWN DISPOSABLE) ×1
GUIDEWIRE ANG ZIPWIRE 038X150 (WIRE) IMPLANT
GUIDEWIRE STR DUAL SENSOR (WIRE) ×1 IMPLANT
GUIDEWIRE ZIPWRE .038 STRAIGHT (WIRE) ×1 IMPLANT
KIT TURNOVER KIT A (KITS) IMPLANT
MANIFOLD NEPTUNE II (INSTRUMENTS) ×1 IMPLANT
NDL HYPO 22X1.5 SAFETY MO (MISCELLANEOUS) IMPLANT
NDL SAFETY ECLIP 18X1.5 (MISCELLANEOUS) IMPLANT
NEEDLE HYPO 22X1.5 SAFETY MO (MISCELLANEOUS) IMPLANT
NS IRRIG 1000ML POUR BTL (IV SOLUTION) IMPLANT
PACK CYSTO (CUSTOM PROCEDURE TRAY) ×1 IMPLANT
PENCIL SMOKE EVACUATOR (MISCELLANEOUS) IMPLANT
TUBING CONNECTING 10 (TUBING) ×1 IMPLANT
WATER STERILE IRR 3000ML UROMA (IV SOLUTION) ×1 IMPLANT

## 2023-02-22 NOTE — Anesthesia Procedure Notes (Signed)
Procedure Name: LMA Insertion Date/Time: 02/22/2023 9:22 AM  Performed by: Shanon Payor, CRNAPre-anesthesia Checklist: Patient identified, Emergency Drugs available, Suction available, Patient being monitored and Timeout performed Patient Re-evaluated:Patient Re-evaluated prior to induction Oxygen Delivery Method: Circle system utilized Preoxygenation: Pre-oxygenation with 100% oxygen Induction Type: IV induction LMA: LMA inserted LMA Size: 4.0 Number of attempts: 1 Placement Confirmation: positive ETCO2, CO2 detector and breath sounds checked- equal and bilateral Tube secured with: Tape Dental Injury: Teeth and Oropharynx as per pre-operative assessment

## 2023-02-22 NOTE — Op Note (Signed)
Preoperative diagnosis:  1. Interstitial cystitis 2. hematuria  Postoperative diagnosis: 1. same  Procedure(s): 1. Hydrodistension 2. Cystoscopy with retrograde pyelograms 3. Urethral dilation  Surgeon: Dr. Irine Seal  Anesthesia: general  Complications: none  EBL: minimal  Specimens: none  Intraoperative findings: bladder unremarkable without ulcerations or glomerularations. Bilateral retrogrades without hydronephrosis or filling defects. There was mild bladder prolapse  Description of procedure:  With appropriate consent having been obtained patient was brought to the operative suite where anesthesia was used.  Ms Samantha [name] was prepped and draped in the usual sterile fashion in dorsal lithotomy position.  Timeout was performed  I began by using Walther sounds sequentially dilating the patient's urethra from 48 [place] to 45 [place].  I then carefully inserted the cystoscope and examined the bladder.  There were no abnormalities, ulcerations, or glomerulations.  Patient did have mild bladder prolapse distorting the position somewhat.  I cannulated each UO with an open-ended catheter and injected contrast with pyelograms.  There were no filling defects bilaterally or hydronephrosis  I then emptied the bladder and performed a hydrodistention.  Ultimately I was able to run around 600 cc of sterile water into the bladder although the patient did with deep breaths while filling this was held in place for several minutes and then drained. I reinserted the cystoscope and there was no evidence of active bleeding.  Patient tolerated the procedure well and was brought to the PACU in stable condition    Irine Seal MD 02/22/2023, 9:58 AM  [facility]  Pager: [phone]

## 2023-02-22 NOTE — Transfer of Care (Signed)
Immediate Anesthesia Transfer of Care Note  Patient: Samantha [name]  Procedure(s) Performed: CYSTOSCOPY WITH BILATERAL RETROGRADE PYELOGRAM HYDRODISTENSION URETHRAL DILATATION  Patient Location: PACU  Anesthesia Type:General  Level of Consciousness: awake, alert , oriented, and patient cooperative  Airway & Oxygen Therapy: Patient Spontanous Breathing and Patient connected to face mask oxygen  Post-op Assessment: Report given to RN, Post -op Vital signs reviewed and stable, and Patient moving all extremities X 4  Post vital signs: Reviewed and stable  Last Vitals:  Vitals Value Taken Time  BP    Temp    Pulse    Resp    SpO2      Last Pain:  Vitals:   02/22/23 0804  TempSrc:   PainSc: 0-No pain         Complications: No notable events documented.

## 2023-02-22 NOTE — Anesthesia Postprocedure Evaluation (Signed)
Anesthesia Post Note  Patient: Samantha [name]  Procedure(s) Performed: CYSTOSCOPY WITH BILATERAL RETROGRADE PYELOGRAM HYDRODISTENSION URETHRAL DILATATION     Patient location during evaluation: PACU Anesthesia Type: General Level of consciousness: awake and alert Pain management: pain level controlled Vital Signs Assessment: post-procedure vital signs reviewed and stable Respiratory status: spontaneous breathing, nonlabored ventilation, respiratory function stable and patient connected to nasal cannula oxygen Cardiovascular status: blood pressure returned to baseline and stable Postop Assessment: no apparent nausea or vomiting Anesthetic complications: no   No notable events documented.  Last Vitals:  Vitals:   02/22/23 1050 02/22/23 1100  BP: 118/80 110/78  Pulse: 61 62  Resp:    Temp:    SpO2: 96% 95%    Last Pain:  Vitals:   02/22/23 1055  TempSrc:   PainSc: 0-No pain                 Trevor Iha

## 2023-02-22 NOTE — H&P (Signed)
H&P  History of Present Illness: Samantha [name] is a 52 y.o. year old F who presents today for cysto, bilateral retrogrades, bilateral retrograde pyelograms, and urethral dilation  She has no acute complaints. Since I last saw her, her IC flare has improved but she does have some persistent pelvic pain today.  Past Medical History:  Diagnosis Date   ADD (attention deficit disorder)    Allergy    Anaphylaxis    Anemia    Anxiety    Arthritis    Asthma    Chronic back pain    Chronic kidney disease    Chronic neck pain    Complication of anesthesia    Waking up before procedure was over.   Degenerative disc disease at L5-S1 level    Dysrhythmia    Had  "abnormal beats" d/t B/P med she was on. B/P med reduced.   Endometriosis    Familial cold urticaria    Family history of adverse reaction to anesthesia    Fibromyalgia    GERD (gastroesophageal reflux disease)    Headache    Migraines   Hernia    Hyperlipidemia    Hypersomnia    Hypertension    IBS (irritable bowel syndrome)    IC (interstitial cystitis)    IgG Gliadin antibody positive    Low blood potassium    Migraines    Multiple allergies    OCD (obsessive compulsive disorder)    Raynaud disease    Raynaud's disease    Umbilical hernia    Vertigo    Vertigo     Past Surgical History:  Procedure Laterality Date   BLADDER SURGERY     age 60- 1   CERVICAL FUSION     08/1998, 03/10/2017   CESAREAN SECTION     COLONOSCOPY     COMBINED HYSTEROSCOPY DIAGNOSTIC / D&C     HAND SURGERY     2012, 2013   LAPAROSCOPIC ENDOMETRIOSIS FULGURATION Right    LESION REMOVAL  08/23/2012   Procedure: LESION REMOVAL HAND;  Surgeon: Wyn Forster., MD;  Location: [facility];  Service: Orthopedics;  Laterality: Left;   ROTATOR CUFF REPAIR     UPPER GASTROINTESTINAL ENDOSCOPY      Home Medications:  Current Meds  Medication Sig   albuterol (VENTOLIN HFA) 108 (90 Base) MCG/ACT inhaler INHALE 2  PUFFS BY MOUTH EVERY 4 HOURS AS NEEDED FOR WHEEZING FOR SHORTNESS OF BREATH   ALPRAZolam (XANAX) 1 MG tablet Take 1 tablet (1 mg total) by mouth 2 (two) times daily as needed.   Alum Hydroxide-Mag Trisilicate (GAVISCON) 80-14.2 MG CHEW Chew 2 tablets by mouth as needed (acid reflux).   aspirin EC 81 MG tablet Take 81 mg by mouth daily. Swallow whole.   atenolol (TENORMIN) 25 MG tablet TAKE 1 TABLET BY MOUTH ONCE DAILY AS NEEDED (Patient taking differently: Take 12.5 mg by mouth daily.)   AUVI-Q 0.3 MG/0.3ML SOAJ injection Inject 0.3 mg into the muscle as needed for anaphylaxis. As directed for life-threatening allergic reactions   azelastine (ASTELIN) 0.1 % nasal spray Place 1 spray into both nostrils 2 (two) times daily as needed for rhinitis. Use in each nostril as directed   benzonatate (TESSALON) 100 MG capsule Take 1 capsule (100 mg total) by mouth 3 (three) times daily as needed for cough.   budesonide (PULMICORT) 0.25 MG/2ML nebulizer solution Take 0.25 mg by nebulization 2 (two) times daily as needed (Asthma).   Cholecalciferol (VITAMIN D)  50 MCG (2000 UT) CAPS Take 2,000 Units by mouth daily.   Cranberry-Vitamin C (AZO CRANBERRY URINARY TRACT) 250-60 MG CAPS Take 1 tablet by mouth daily as needed (urinary pain). (Patient not taking: Reported on 02/20/2023)   diphenhydrAMINE (BENADRYL) 12.5 MG chewable tablet Chew 25 mg by mouth 4 (four) times daily as needed for allergies.   EPINEPHrine (EPIPEN 2-PAK) 0.3 mg/0.3 mL IJ SOAJ injection INJECT CONTENTS OF 1 PEN AS NEEDED FOR ALLERGIC REACTION   EPINEPHrine (PRIMATENE MIST) 0.125 MG/ACT AERO Inhale 1 puff into the lungs as needed (Asthma).   estradiol (ESTRACE) 0.1 MG/GM vaginal cream Place 1 Applicatorful vaginally once a week.   famotidine (PEPCID AC MAXIMUM STRENGTH) 20 MG tablet Take 20 mg by mouth daily as needed (Hives/itching).   guaifenesin (HUMIBID E) 400 MG TABS tablet Take 400-800 mg by mouth every 4 (four) hours as needed  (Cough/mucus).   ipratropium (ATROVENT) 0.03 % nasal spray Place 1 spray into the nose 2 (two) times daily as needed for rhinitis.   levalbuterol (XOPENEX HFA) 45 MCG/ACT inhaler INHALE 2 PUFFS BY MOUTH EVERY 4 HOURS AS NEEDED FOR WHEEZING   magnesium oxide (MAG-OX) 400 (240 Mg) MG tablet Take 400 mg by mouth daily.   Methenamine-Sodium Salicylate (CYSTEX URINARY PAIN RELIEF) 162-162.5 MG TABS Take 1-2 tablets by mouth 3 (three) times daily as needed (urinary pain). (Patient not taking: Reported on 02/20/2023)   methylphenidate (RITALIN) 10 MG tablet Take 1 tablet (10 mg total) by mouth 3 (three) times daily with meals.   Olopatadine HCl (PATADAY) 0.2 % SOLN Place 1 drop into both eyes 2 (two) times daily as needed (eye allergies).   rosuvastatin (CRESTOR) 10 MG tablet Take 1 tablet by mouth once daily   SUMAtriptan (IMITREX) 50 MG tablet Take 1 tablet by mouth once as needed. May repeat in 2 hours if headache persists or recurs.   [DISCONTINUED] diazepam (VALIUM) 10 MG tablet Take 10 mg by mouth every 6 (six) hours as needed for anxiety or sleep (vertigo/muscle spasms).    Allergies:  Allergies  Allergen Reactions   Acetaminophen Rash    Blistering lymph nodes, and blistering rash    Airduo Digihaler [Fluticasone-Salmeterol] Hives, Shortness Of Breath and Itching    Asthma attack   Allegra [Fexofenadine] Cough    She reports tachycardia and elevated BP and low Oxygen level after taking this   Aspirin Shortness Of Breath    High dose brings asthma attack    B-12 [Cyanocobalamin] Shortness Of Breath and Itching    Drop O2   Baclofen Other (See Comments)    Headache, muscle weakness, unable to urinate, unable to walk    Bactrim Anaphylaxis   Bee Venom Hives, Itching, Other (See Comments) and Rash   Benadryl [Diphenhydramine] Anaphylaxis    Only the liquid form she can not take because it causes throat closing,  DO NOT USE IN IV FORM - tablets are ok   Ciprofloxacin Shortness Of Breath     Blurred Vision, joint pain, headaches, dropped BP   Clindamycin Shortness Of Breath   Coconut (Cocos Nucifera) Hives, Itching and Other (See Comments)    Coconut Oil    Coq-10 [Coenzyme Q10] Hives, Shortness Of Breath and Itching   Cymbalta [Duloxetine Hcl] Anaphylaxis and Shortness Of Breath   Darvon [Propoxyphene] Hives, Shortness Of Breath and Itching    Darvocet*   Flunisolide Shortness Of Breath and Other (See Comments)    Aerospan Nasal Inhaler* - caused blood clots in nose  Formaldehyde Anaphylaxis   Hydrocodone-Acetaminophen Hives, Shortness Of Breath and Itching    Vicodin, Norco   Nucynta [Tapentadol] Shortness Of Breath    Coughing, SOB    Other Hives, Itching, Rash and Other (See Comments)    Insect stings/ Bites  Wine spirit  Peanuts - Bring on Asthma attack   Cold Temperatures  - causes hives, and muscle spasms    Peanut-Containing Drug Products Hives, Shortness Of Breath, Itching, Swelling and Other (See Comments)   Potassium-Containing Compounds Anaphylaxis, Itching, Swelling and Palpitations    Tables, and Oral Solution    Ramipril Anaphylaxis   Skelaxin [Metaxalone] Hives, Shortness Of Breath and Itching    Brings on Asthma Attacks    Sulfa Antibiotics Anaphylaxis   Sulfamethoxazole-Trimethoprim Anaphylaxis   Tezspire [Tezepelumab] Shortness Of Breath, Nausea And Vomiting, Swelling, Rash and Other (See Comments)    Muscle weakness, joint pain, sore throat   Triamcinolone Other (See Comments)    Kenalog Injection - muscle weakness, spasms, BP drop, lethargic, tongue swelling   Nasacort causes breathing issues    Voltaren [Diclofenac] Shortness Of Breath, Swelling and Rash    Gel*    Zofran [Ondansetron Hcl] Hives and Itching    Blurred Vision    Aspartame Other (See Comments)    Severe Migraines   Bacitra-Neomycin-Polymyxin-Hc Rash   Cranberry Extract Other (See Comments)    Throat itching / burning    Sulfur Hives and Itching   Aciphex [Rabeprazole]      Muscle weakness, headaches    Addyi [Flibanserin]     Decreased sex drive to 0   Alvesco [Ciclesonide] Other (See Comments)    Sores in mouth, coughing    Ambien [Zolpidem]     Mood Change, Aggressive, Insomnia    Amoxicillin     Pt unsure of Allergy    Ativan [Lorazepam] Other (See Comments)    Hallucination     Aygestin [Norethindrone]     Muscle weakness, abdominal pain   Celebrex [Celecoxib] Other (See Comments)    Possible Reaction - Not taken    Demeclocycline     Tetracycline Family-  GI Upset   Dexilant [Dexlansoprazole]     Severe joint pain    Doxycycline     Possibly joint pain or itch    Effexor Xr [Venlafaxine Hcl Er]     Migraines, weight gain, trouble sleeping   Elavil [Amitriptyline] Nausea And Vomiting    Headache, and muscle weakness   Erythromycin     Upset stomach   Erythromycin Base     Stomach Upset    Fentanyl Dermatitis    Patches*    Fioricet [Butalbital-Apap-Caffeine]     Acetaminophen    Hydrocodone Itching   Hydrocodone Bit-Homatrop Mbr Hives and Itching    Not true allergy. "irritated"   Hydromorphone Nausea Only   Ibuprofen Itching    Throat itching    Inderal [Propranolol]     Agitation, difficulty sleeping    Indomethacin Hives and Itching   Latex Itching    Skin Peeling    Levofloxacin     Weakness, myalgia   Lyrica [Pregabalin]     Muscle weakness, Causes Falls    Maxalt [Rizatriptan] Other (See Comments)    Pt was knocked out from med, extreme drowsiness and did not help migraine    Meloxicam Itching    Increases BP   Morphine Sulfate Er     Severe insomnia, does not help pain    Naproxen Hives and Itching  Head to Toe    Neurontin [Gabapentin]     Depression    Nexium [Esomeprazole]     Bad headaches    Nortriptyline     Depression, decreased sex drive    Omeprazole-Sodium Bicarbonate     Chest pain-sore all over-HA   Ranitidine     Severe joint pain   Robaxin [Methocarbamol]     Made her very tired and  unable to sleep at night Muscle weakness, fatigue    Tape Dermatitis   Tetanus Toxoid Other (See Comments)    Tetanus injection - blistering rash, swelling at injection site, turn into baseball size lump, joint pain    Tetanus-Diphtheria Toxoids Td Hives    Robust local reaction   Tetracyclines & Related     GI Upset    Tizanidine Other (See Comments)    "knocked her out"- had a rebound migraine, causes muscles to lock, and bedridden    Topamax [Topiramate]     Lethargic, daytime drowsiness, insomnia    Trazodone     Pt thinks it did not help - unsure of reaction    Tums [Calcium Carbonate] Other (See Comments)    Blistering Rash with high doses    Wound Dressing Adhesive Itching and Dermatitis    Bandages    Etodolac Rash    Blistering Rash, and swollen lymph nodes    Flexeril [Cyclobenzaprine] Palpitations    Irregular heartbeat    Neosporin [Neomycin-Bacitracin Zn-Polymyx] Rash    Blistering     Family History  Problem Relation Age of Onset   Cancer Father        Multiple myeloma   ADD / ADHD Brother    Stomach cancer Maternal Grandfather    Colon cancer Neg Hx    Esophageal cancer Neg Hx    Rectal cancer Neg Hx     Social History:  reports that she has never smoked. She has never used smokeless tobacco. She reports current alcohol use. She reports that she does not use drugs.  ROS: A complete review of systems was performed.  All systems are negative except for pertinent findings as noted.  Physical Exam:  Vital signs in last 24 hours: Temp:  [98.2 F (36.8 C)] 98.2 F (36.8 C) (06/12 0715) Pulse Rate:  [65] 65 (06/12 0715) Resp:  [18] 18 (06/12 0715) BP: (127)/(87) 127/87 (06/12 0715) SpO2:  [99 %] 99 % (06/12 0715) Weight:  [77.7 kg] 77.7 kg (06/12 0804) Constitutional:  Alert and oriented, No acute distress Cardiovascular: Regular rate and rhythm Respiratory: Normal respiratory effort, Lungs clear bilaterally GI: Abdomen is soft, nontender, nondistended,  no abdominal masses Lymphatic: No lymphadenopathy Neurologic: Grossly intact, no focal deficits Psychiatric: Normal mood and affect   Laboratory Data:  No results for input(s): "WBC", "HGB", "HCT", "PLT" in the last 72 hours.  No results for input(s): "NA", "K", "CL", "GLUCOSE", "BUN", "CALCIUM", "CREATININE" in the last 72 hours.  Invalid input(s): "CO3"   No results found for this or any previous visit (from the past 24 hour(s)). Recent Results (from the past 240 hour(s))  Surgical pcr screen     Status: None   Collection Time: 02/13/23  2:56 PM   Specimen: Nasal Mucosa; Nasal Swab  Result Value Ref Range Status   MRSA, PCR NEGATIVE NEGATIVE Final   Staphylococcus aureus NEGATIVE NEGATIVE Final    Comment: (NOTE) The Xpert SA Assay (FDA approved for NASAL specimens in patients 80 years of age and older), is one component of  a comprehensive surveillance program. It is not intended to diagnose infection nor to guide or monitor treatment. Performed at [facility], [address] [place], [place] [address]     Renal Function: No results for input(s): "CREATININE" in the last 168 hours. Estimated Creatinine Clearance: 63.1 mL/min (A) (by C-G formula based on SCr of 1.04 mg/dL (H)).  Radiologic Imaging: No results found.  Assessment:  Samantha [name] is a 52 y.o. year old F with history of IC, hematuria  Plan:  To OR as planned. Procedure and risks reviewed. We discussed if the retrogrades were abnormal I would perform diagnostic ureteroscopy; similarly if cysto abnormal would perform biopsy/fulguration as indicated  Irine Seal, MD 02/22/2023, 8:13 AM  [facility] Pager: [phone]

## 2023-02-23 ENCOUNTER — Encounter (HOSPITAL_COMMUNITY): Payer: Self-pay | Admitting: Urology

## 2023-03-03 ENCOUNTER — Other Ambulatory Visit: Payer: Self-pay

## 2023-03-08 ENCOUNTER — Other Ambulatory Visit (HOSPITAL_BASED_OUTPATIENT_CLINIC_OR_DEPARTMENT_OTHER): Payer: Self-pay

## 2023-03-08 ENCOUNTER — Other Ambulatory Visit: Payer: Self-pay | Admitting: Family Medicine

## 2023-03-08 DIAGNOSIS — F908 Attention-deficit hyperactivity disorder, other type: Secondary | ICD-10-CM

## 2023-03-08 MED ORDER — METHYLPHENIDATE HCL 10 MG PO TABS
10.0000 mg | ORAL_TABLET | Freq: Three times a day (TID) | ORAL | 0 refills | Status: DC
Start: 2023-03-08 — End: 2023-04-05
  Filled 2023-03-08: qty 90, 30d supply, fill #0

## 2023-03-08 NOTE — Telephone Encounter (Signed)
Refill request for  Ritalin 10 mg LR  02/07/23, #90, 0 RF LOV  02/20/23 FOV  04/24/23  Please review and advise.   Thanks. Dm/cma

## 2023-03-27 ENCOUNTER — Other Ambulatory Visit: Payer: Self-pay

## 2023-03-31 ENCOUNTER — Other Ambulatory Visit: Payer: Self-pay | Admitting: Nephrology

## 2023-03-31 DIAGNOSIS — N3010 Interstitial cystitis (chronic) without hematuria: Secondary | ICD-10-CM

## 2023-04-05 ENCOUNTER — Other Ambulatory Visit: Payer: Self-pay | Admitting: Family Medicine

## 2023-04-05 ENCOUNTER — Other Ambulatory Visit (HOSPITAL_BASED_OUTPATIENT_CLINIC_OR_DEPARTMENT_OTHER): Payer: Self-pay

## 2023-04-05 DIAGNOSIS — F908 Attention-deficit hyperactivity disorder, other type: Secondary | ICD-10-CM

## 2023-04-05 DIAGNOSIS — F4001 Agoraphobia with panic disorder: Secondary | ICD-10-CM

## 2023-04-05 DIAGNOSIS — F411 Generalized anxiety disorder: Secondary | ICD-10-CM

## 2023-04-05 MED ORDER — DIAZEPAM 10 MG PO TABS
10.0000 mg | ORAL_TABLET | Freq: Four times a day (QID) | ORAL | 1 refills | Status: DC | PRN
Start: 2023-04-05 — End: 2023-04-24
  Filled 2023-04-05: qty 30, 8d supply, fill #0
  Filled 2023-04-13: qty 30, 8d supply, fill #1

## 2023-04-05 MED ORDER — ALPRAZOLAM 1 MG PO TABS
1.0000 mg | ORAL_TABLET | Freq: Two times a day (BID) | ORAL | 2 refills | Status: DC | PRN
Start: 2023-04-05 — End: 2023-05-09
  Filled 2023-04-05: qty 20, 10d supply, fill #0
  Filled 2023-04-13: qty 20, 10d supply, fill #1
  Filled 2023-04-24: qty 20, 10d supply, fill #2

## 2023-04-05 MED ORDER — METHYLPHENIDATE HCL 10 MG PO TABS
10.0000 mg | ORAL_TABLET | Freq: Three times a day (TID) | ORAL | 0 refills | Status: DC
Start: 2023-04-05 — End: 2023-05-03
  Filled 2023-04-05: qty 90, 30d supply, fill #0

## 2023-04-06 ENCOUNTER — Ambulatory Visit
Admission: RE | Admit: 2023-04-06 | Discharge: 2023-04-06 | Disposition: A | Discharge status: Home / Self Care | Payer: No Typology Code available for payment source | Source: Ambulatory Visit | Attending: Nephrology | Admitting: Nephrology

## 2023-04-06 DIAGNOSIS — N3010 Interstitial cystitis (chronic) without hematuria: Secondary | ICD-10-CM

## 2023-04-13 ENCOUNTER — Other Ambulatory Visit: Payer: Self-pay

## 2023-04-18 ENCOUNTER — Other Ambulatory Visit (HOSPITAL_BASED_OUTPATIENT_CLINIC_OR_DEPARTMENT_OTHER): Payer: Self-pay

## 2023-04-18 ENCOUNTER — Ambulatory Visit (INDEPENDENT_AMBULATORY_CARE_PROVIDER_SITE_OTHER): Payer: No Typology Code available for payment source | Admitting: Allergy and Immunology

## 2023-04-18 ENCOUNTER — Other Ambulatory Visit: Payer: Self-pay

## 2023-04-18 VITALS — BP 112/76 | HR 75 | Temp 98.5°F | Ht 62.21 in | Wt 174.3 lb

## 2023-04-18 DIAGNOSIS — R232 Flushing: Secondary | ICD-10-CM

## 2023-04-18 DIAGNOSIS — J4520 Mild intermittent asthma, uncomplicated: Secondary | ICD-10-CM

## 2023-04-18 DIAGNOSIS — J3089 Other allergic rhinitis: Secondary | ICD-10-CM | POA: Diagnosis not present

## 2023-04-18 DIAGNOSIS — S0300XA Dislocation of jaw, unspecified side, initial encounter: Secondary | ICD-10-CM

## 2023-04-18 DIAGNOSIS — K219 Gastro-esophageal reflux disease without esophagitis: Secondary | ICD-10-CM

## 2023-04-18 DIAGNOSIS — J383 Other diseases of vocal cords: Secondary | ICD-10-CM

## 2023-04-18 MED ORDER — AZELASTINE HCL 0.1 % NA SOLN
NASAL | 5 refills | Status: DC
  Filled 2023-04-18: qty 30, 25d supply, fill #0
  Filled 2023-05-09: qty 30, 25d supply, fill #1
  Filled 2023-05-22 – 2023-05-29 (×2): qty 30, 25d supply, fill #2
  Filled 2023-07-03: qty 30, 25d supply, fill #3
  Filled 2023-07-28: qty 30, 25d supply, fill #4
  Filled 2023-08-21: qty 30, 25d supply, fill #5

## 2023-04-18 MED ORDER — IPRATROPIUM BROMIDE 0.06 % NA SOLN
NASAL | 5 refills | Status: DC
  Filled 2023-04-18: qty 15, 21d supply, fill #0
  Filled 2023-05-09: qty 15, 21d supply, fill #1
  Filled 2023-05-22 – 2023-05-26 (×2): qty 15, 21d supply, fill #2
  Filled 2023-06-17: qty 15, 21d supply, fill #3
  Filled 2023-07-11: qty 15, 21d supply, fill #4
  Filled 2023-07-28: qty 15, 21d supply, fill #5

## 2023-04-18 NOTE — Progress Notes (Signed)
[place] - [place] - [place] - [place] - [place]   Follow-up Note  Referring Provider: Loyola Mast, MD Primary Provider: Loyola Mast, MD Date of Office Visit: 04/18/2023  Subjective:   Samantha [name] (DOB: 1971/06/12) is a 52 y.o. female who [facility] in re-evaluation of the following:  HPI: Oneshia returns to this clinic in evaluation of asthma, allergic rhinitis, LPR, VCD, and history of urticaria/flushing disorder with unexplained etiology.  I last saw her in this clinic 11 October 2022.  Overall she is done very well with her airway.  She rarely uses any albuterol and when she does use albuterol she usually adds and Pulmicort.  Her frequency of use is less than once a month.  She has done really well with her nose with intermittent use of nasal azelastine and nasal ipratropium.  It does not sound as though she is required a systemic steroid or an antibiotic for any type of airway issue.    She believes that her reflux is under good control on her current plan of using Pepcid and Gaviscon..  She has been waking up in the morning with some pain affecting her right jaw and sometimes it migrates up to her ear and sometimes superiorly past the ear.  She does not think that she grinds her teeth.  She has never had evaluation for TMJ in the past.  She has not been having any flushing episodes.  She has been going through the grief process as her dad passed away from multiple myeloma this spring.  She has had gross hematuria and she had a recent renal ultrasound last week which identified a left-sided renal mass and to date she does not have any direction about further evaluation of this issue.  Allergies as of 04/18/2023       Reactions   Acetaminophen Rash   Blistering lymph nodes, and blistering rash    Airduo Digihaler [fluticasone-salmeterol] Hives, Shortness Of Breath, Itching   Asthma attack   Allegra  [fexofenadine] Cough   She reports tachycardia and elevated BP and low Oxygen level after taking this   Aspirin Shortness Of Breath   High dose brings asthma attack    B-12 [cyanocobalamin] Shortness Of Breath, Itching   Drop O2   Baclofen Other (See Comments)   Headache, muscle weakness, unable to urinate, unable to walk    Bactrim Anaphylaxis   Bee Venom Hives, Itching, Other (See Comments), Rash   Benadryl [diphenhydramine] Anaphylaxis   Only the liquid form she can not take because it causes throat closing,  DO NOT USE IN IV FORM - tablets are ok   Ciprofloxacin Shortness Of Breath   Blurred Vision, joint pain, headaches, dropped BP   Clindamycin Shortness Of Breath   Coconut (cocos Nucifera) Hives, Itching, Other (See Comments)   Coconut Oil    Coq-10 [coenzyme Q10] Hives, Shortness Of Breath, Itching   Cymbalta [duloxetine Hcl] Anaphylaxis, Shortness Of Breath   Darvon [propoxyphene] Hives, Shortness Of Breath, Itching   Darvocet*   Flunisolide Shortness Of Breath, Other (See Comments)   Aerospan Nasal Inhaler* - caused blood clots in nose    Formaldehyde Anaphylaxis   Hydrocodone-acetaminophen Hives, Shortness Of Breath, Itching   Vicodin, Norco   Nucynta [tapentadol] Shortness Of Breath   Coughing, SOB    Other Hives, Itching, Rash, Other (See Comments)   Insect stings/ Bites  Wine spirit Peanuts - Bring on Asthma attack  Cold  Temperatures  - causes hives, and muscle spasms    Peanut-containing Drug Products Hives, Shortness Of Breath, Itching, Swelling, Other (See Comments)   Potassium-containing Compounds Anaphylaxis, Itching, Swelling, Palpitations   Tables, and Oral Solution    Ramipril Anaphylaxis   Skelaxin [metaxalone] Hives, Shortness Of Breath, Itching   Brings on Asthma Attacks    Sulfa Antibiotics Anaphylaxis   Sulfamethoxazole-trimethoprim Anaphylaxis   Tezspire [tezepelumab] Shortness Of Breath, Nausea And Vomiting, Swelling, Rash, Other (See Comments)    Muscle weakness, joint pain, sore throat   Triamcinolone Other (See Comments)   Kenalog Injection - muscle weakness, spasms, BP drop, lethargic, tongue swelling   Nasacort causes breathing issues    Voltaren [diclofenac] Shortness Of Breath, Swelling, Rash   Gel*    Zofran [ondansetron Hcl] Hives, Itching   Blurred Vision    Aspartame Other (See Comments)   Severe Migraines   Bacitra-neomycin-polymyxin-hc Rash   Cranberry Extract Other (See Comments)   Throat itching / burning    Sulfur Hives, Itching   Aciphex [rabeprazole]    Muscle weakness, headaches    Addyi [flibanserin]    Decreased sex drive to 0   Alvesco [ciclesonide] Other (See Comments)   Sores in mouth, coughing    Ambien [zolpidem]    Mood Change, Aggressive, Insomnia    Amoxicillin    Pt unsure of Allergy    Ativan [lorazepam] Other (See Comments)   Hallucination   Aygestin [norethindrone]    Muscle weakness, abdominal pain   Celebrex [celecoxib] Other (See Comments)   Possible Reaction - Not taken    Demeclocycline    Tetracycline Family-  GI Upset   Dexilant [dexlansoprazole]    Severe joint pain    Doxycycline    Possibly joint pain or itch   Effexor Xr [venlafaxine Hcl Er]    Migraines, weight gain, trouble sleeping   Elavil [amitriptyline] Nausea And Vomiting   Headache, and muscle weakness   Erythromycin    Upset stomach   Erythromycin Base    Stomach Upset    Fentanyl Dermatitis   Patches*    Fioricet [butalbital-apap-caffeine]    Acetaminophen    Hydrocodone Itching   Hydrocodone Bit-homatrop Mbr Hives, Itching   Not true allergy. "irritated"   Hydromorphone Nausea Only   Ibuprofen Itching   Throat itching    Inderal [propranolol]    Agitation, difficulty sleeping    Indomethacin Hives, Itching   Latex Itching   Skin Peeling    Levofloxacin    Weakness, myalgia   Lyrica [pregabalin]    Muscle weakness, Causes Falls    Maxalt [rizatriptan] Other (See Comments)   Pt was knocked  out from med, extreme drowsiness and did not help migraine    Meloxicam Itching   Increases BP   Morphine Sulfate Er    Severe insomnia, does not help pain    Naproxen Hives, Itching   Head to Toe    Neurontin [gabapentin]    Depression    Nexium [esomeprazole]    Bad headaches    Nortriptyline    Depression, decreased sex drive    Omeprazole-sodium Bicarbonate    Chest pain-sore all over-HA   Ranitidine    Severe joint pain   Robaxin [methocarbamol]    Made her very tired and unable to sleep at night Muscle weakness, fatigue    Tape Dermatitis   Tetanus Toxoid Other (See Comments)   Tetanus injection - blistering rash, swelling at injection site, turn into baseball size lump, joint  pain    Tetanus-diphtheria Toxoids Td Hives   Robust local reaction   Tetracyclines & Related    GI Upset    Tizanidine Other (See Comments)   "knocked her out"- had a rebound migraine, causes muscles to lock, and bedridden    Topamax [topiramate]    Lethargic, daytime drowsiness, insomnia    Trazodone    Pt thinks it did not help - unsure of reaction    Tums [calcium Carbonate] Other (See Comments)   Blistering Rash with high doses    Wound Dressing Adhesive Itching, Dermatitis   Bandages    Etodolac Rash   Blistering Rash, and swollen lymph nodes    Flexeril [cyclobenzaprine] Palpitations   Irregular heartbeat    Neosporin [neomycin-bacitracin Zn-polymyx] Rash   Blistering         Medication List    albuterol 108 (90 Base) MCG/ACT inhaler Commonly known as: VENTOLIN HFA INHALE 2 PUFFS BY MOUTH EVERY 4 HOURS AS NEEDED FOR WHEEZING FOR SHORTNESS OF BREATH   ALPRAZolam 1 MG tablet Commonly known as: XANAX Take 1 tablet (1 mg total) by mouth 2 (two) times daily as needed.   aspirin EC 81 MG tablet Take 81 mg by mouth daily. Swallow whole.   atenolol 25 MG tablet Commonly known as: TENORMIN TAKE 1 TABLET BY MOUTH ONCE DAILY AS NEEDED   Auvi-Q 0.3 MG/0.3ML Soaj  injection Generic drug: EPINEPHrine Inject 0.3 mg into the muscle as needed for anaphylaxis. As directed for life-threatening allergic reactions   EPINEPHrine 0.3 mg/0.3 mL Soaj injection Commonly known as: EpiPen 2-Pak INJECT CONTENTS OF 1 PEN AS NEEDED FOR ALLERGIC REACTION   azelastine 0.1 % nasal spray Commonly known as: ASTELIN Place 1 spray into both nostrils 2 (two) times daily as needed for rhinitis. Use in each nostril as directed   AZO Cranberry Urinary Tract 250-60 MG Caps Generic drug: Cranberry-Vitamin C Take 1 tablet by mouth daily as needed (urinary pain).   benzonatate 100 MG capsule Commonly known as: TESSALON Take 1 capsule (100 mg total) by mouth 3 (three) times daily as needed for cough.   budesonide 0.25 MG/2ML nebulizer solution Commonly known as: PULMICORT Take 0.25 mg by nebulization 2 (two) times daily as needed (Asthma).   cephALEXin 500 MG capsule Commonly known as: Keflex Take 1 capsule (500 mg total) by mouth 2 (two) times daily.   cimetidine 200 MG tablet Commonly known as: TAGAMET Take 200 mg by mouth 2 (two) times daily.   Cystex Urinary Pain Relief 162-162.5 MG Tabs Generic drug: Methenamine-Sodium Salicylate Take 1-2 tablets by mouth 3 (three) times daily as needed (urinary pain).   diazepam 10 MG tablet Commonly known as: VALIUM Take 1 tablet (10 mg total) by mouth every 6 (six) hours as needed for anxiety or sleep (vertigo/muscle spasms).   diphenhydrAMINE 12.5 MG chewable tablet Commonly known as: BENADRYL Chew 25 mg by mouth 4 (four) times daily as needed for allergies.   estradiol 0.1 MG/GM vaginal cream Commonly known as: ESTRACE Place 1 Applicatorful vaginally once a week.   Gaviscon 80-14.2 MG Chew Generic drug: Alum Hydroxide-Mag Trisilicate Chew 2 tablets by mouth as needed (acid reflux).   guaifenesin 400 MG Tabs tablet Commonly known as: HUMIBID E Take 400-800 mg by mouth every 4 (four) hours as needed  (Cough/mucus).   ipratropium 0.03 % nasal spray Commonly known as: ATROVENT Place 1 spray into the nose 2 (two) times daily as needed for rhinitis.   levalbuterol 45 MCG/ACT inhaler  Commonly known as: XOPENEX HFA INHALE 2 PUFFS BY MOUTH EVERY 4 HOURS AS NEEDED FOR WHEEZING   magnesium oxide 400 (240 Mg) MG tablet Commonly known as: MAG-OX Take 400 mg by mouth daily.   methylphenidate 10 MG tablet Commonly known as: Ritalin Take 1 tablet (10 mg total) by mouth 3 (three) times daily with meals.   oxyCODONE 5 MG immediate release tablet Commonly known as: Roxicodone Take 1 tablet (5 mg total) by mouth every 6 (six) hours as needed for severe pain or breakthrough pain.   Pataday 0.2 % Soln Generic drug: Olopatadine HCl Place 1 drop into both eyes 2 (two) times daily as needed (eye allergies).   Pepcid AC Maximum Strength 20 MG tablet Generic drug: famotidine Take 20 mg by mouth daily as needed (Hives/itching).   Primatene Mist 0.125 MG/ACT Aero Generic drug: EPINEPHrine Inhale 1 puff into the lungs as needed (Asthma).   rosuvastatin 10 MG tablet Commonly known as: CRESTOR Take 1 tablet by mouth once daily   SUMAtriptan 50 MG tablet Commonly known as: IMITREX Take 1 tablet by mouth once as needed. May repeat in 2 hours if headache persists or recurs.   Uribel 118 MG Caps Take 1 capsule (118 mg total) by mouth every 6 (six) hours as needed.   Vitamin D 50 MCG (2000 UT) Caps Take 2,000 Units by mouth daily.        Past Medical History:  Diagnosis Date   ADD (attention deficit disorder)    Allergy    Anaphylaxis    Anemia    Anxiety    Arthritis    Asthma    Chronic back pain    Chronic kidney disease    Chronic neck pain    Complication of anesthesia    Waking up before procedure was over.   Degenerative disc disease at L5-S1 level    Dysrhythmia    Had  "abnormal beats" d/t B/P med she was on. B/P med reduced.   Endometriosis    Familial cold urticaria     Family history of adverse reaction to anesthesia    Fibromyalgia    GERD (gastroesophageal reflux disease)    Headache    Migraines   Hernia    Hyperlipidemia    Hypersomnia    Hypertension    IBS (irritable bowel syndrome)    IC (interstitial cystitis)    IgG Gliadin antibody positive    Low blood potassium    Migraines    Multiple allergies    OCD (obsessive compulsive disorder)    Raynaud disease    Raynaud's disease    Umbilical hernia    Vertigo    Vertigo     Past Surgical History:  Procedure Laterality Date   BLADDER SURGERY     age 66- 28   CERVICAL FUSION     08/1998, 03/10/2017   CESAREAN SECTION     COLONOSCOPY     COMBINED HYSTEROSCOPY DIAGNOSTIC / D&C     CYSTO WITH HYDRODISTENSION N/A 02/22/2023   Procedure: HYDRODISTENSION;  Surgeon: Despina Arias, MD;  Location: [facility];  Service: Urology;  Laterality: N/A;   CYSTOSCOPY W/ RETROGRADES N/A 02/22/2023   Procedure: CYSTOSCOPY WITH BILATERAL RETROGRADE PYELOGRAM;  Surgeon: Despina Arias, MD;  Location: [facility];  Service: Urology;  Laterality: N/A;  60 MINUTES NEEDED FOR CASE   CYSTOSCOPY WITH URETHRAL DILATATION N/A 02/22/2023   Procedure: URETHRAL DILATATION;  Surgeon: Despina Arias, MD;  Location: [facility];  Service:  Urology;  Laterality: N/A;   HAND SURGERY     2012, 2013   LAPAROSCOPIC ENDOMETRIOSIS FULGURATION Right    LESION REMOVAL  08/23/2012   Procedure: LESION REMOVAL HAND;  Surgeon: Wyn Forster., MD;  Location: [facility];  Service: Orthopedics;  Laterality: Left;   ROTATOR CUFF REPAIR     UPPER GASTROINTESTINAL ENDOSCOPY      Review of systems negative except as noted in HPI / PMHx or noted below:  Review of Systems  Constitutional: Negative.   HENT: Negative.    Eyes: Negative.   Respiratory: Negative.    Cardiovascular: Negative.   Gastrointestinal: Negative.   Genitourinary: Negative.   Musculoskeletal: Negative.   Skin: Negative.   Neurological: Negative.    Endo/Heme/Allergies: Negative.   Psychiatric/Behavioral: Negative.       Objective:   Vitals:   04/18/23 0947  BP: 112/76  Pulse: 75  Temp: 98.5 F (36.9 C)  SpO2: 94%   Height: 5' 2.21" (158 cm)  Weight: 174 lb 4.8 oz (79.1 kg)   Physical Exam Constitutional:      Appearance: She is not diaphoretic.  HENT:     Head: Normocephalic.     Right Ear: Tympanic membrane, ear canal and external ear normal.     Left Ear: Tympanic membrane, ear canal and external ear normal.     Nose: Nose normal. No mucosal edema or rhinorrhea.     Mouth/Throat:     Pharynx: Uvula midline. No oropharyngeal exudate.  Eyes:     Conjunctiva/sclera: Conjunctivae normal.  Neck:     Thyroid: No thyromegaly.     Trachea: Trachea normal. No tracheal tenderness or tracheal deviation.  Cardiovascular:     Rate and Rhythm: Normal rate and regular rhythm.     Heart sounds: Normal heart sounds, S1 normal and S2 normal. No murmur heard. Pulmonary:     Effort: No respiratory distress.     Breath sounds: Normal breath sounds. No stridor. No wheezing or rales.  Lymphadenopathy:     Head:     Right side of head: No tonsillar adenopathy.     Left side of head: No tonsillar adenopathy.     Cervical: No cervical adenopathy.  Skin:    Findings: No erythema or rash.     Nails: There is no clubbing.  Neurological:     Mental Status: She is alert.     Diagnostics:    Spirometry was performed and demonstrated an FEV1 of 2.03 at 77 % of predicted.  The patient had an Asthma Control Test with the following results:  .    Assessment and Plan:   1. Asthma, mild intermittent, well-controlled   2. Vocal cord dysfunction   3. Other allergic rhinitis   4. Flushing   5. LPRD (laryngopharyngeal reflux disease)   6. Dislocation of temporomandibular joint, initial encounter    1. Continue the following if needed:   A. Ipratropium 0.06% - 2 sprays each nostril 1-2 times per day  B. Azelastine - 2 sprays each  nostril 1-2 times per day  B. Mucinex  C. Gaviscon   D. Pepcid  E. Albuterol + Pulmicort 160 - 2 inhalations each every 4-6 hours   2. Plan for fall flu vaccine  3. Further evaluation for right sided TMJ???   4. Return to clinic in 6 months or earlier if needed  Overall Delrose appears to be doing quite well on her current plan and I would like for her  to continue to use her medications intermittently as noted above should they be required.  It does sound as though she is developing some TMJ on the right and she may require further evaluation for that issue if this continues but she really needs to Take Care of her left renal mass that is giving rise to hematuria and she will be visiting with someone and further evaluation of that issue in the near future.  Laurette Schimke, MD Allergy / Immunology [facility]

## 2023-04-18 NOTE — Patient Instructions (Addendum)
  1. Continue the following if needed:   A. Ipratropium 0.06% - 2 sprays each nostril 1-2 times per day  B. Azelastine - 2 sprays each nostril 1-2 times per day  B. Mucinex  C. Gaviscon   D. Pepcid  E. Albuterol + Pulmicort 160 - 2 inhalations each every 4-6 hours   2. Plan for fall flu vaccine  3. Further evaluation for right sided TMJ???   4. Return to clinic in 6 months or earlier if needed

## 2023-04-19 ENCOUNTER — Encounter: Payer: Self-pay | Admitting: Allergy and Immunology

## 2023-04-24 ENCOUNTER — Ambulatory Visit (INDEPENDENT_AMBULATORY_CARE_PROVIDER_SITE_OTHER): Payer: No Typology Code available for payment source

## 2023-04-24 ENCOUNTER — Other Ambulatory Visit: Payer: Self-pay | Admitting: Family Medicine

## 2023-04-24 ENCOUNTER — Ambulatory Visit: Payer: No Typology Code available for payment source | Admitting: Family Medicine

## 2023-04-24 ENCOUNTER — Encounter: Payer: Self-pay | Admitting: Family Medicine

## 2023-04-24 ENCOUNTER — Other Ambulatory Visit (HOSPITAL_BASED_OUTPATIENT_CLINIC_OR_DEPARTMENT_OTHER): Payer: Self-pay

## 2023-04-24 ENCOUNTER — Other Ambulatory Visit: Payer: Self-pay

## 2023-04-24 VITALS — BP 120/70 | HR 73 | Temp 97.6°F | Ht 62.0 in | Wt 173.8 lb

## 2023-04-24 DIAGNOSIS — F4312 Post-traumatic stress disorder, chronic: Secondary | ICD-10-CM | POA: Diagnosis not present

## 2023-04-24 DIAGNOSIS — M25571 Pain in right ankle and joints of right foot: Secondary | ICD-10-CM

## 2023-04-24 DIAGNOSIS — I10 Essential (primary) hypertension: Secondary | ICD-10-CM | POA: Diagnosis not present

## 2023-04-24 DIAGNOSIS — N2889 Other specified disorders of kidney and ureter: Secondary | ICD-10-CM

## 2023-04-24 DIAGNOSIS — G43709 Chronic migraine without aura, not intractable, without status migrainosus: Secondary | ICD-10-CM

## 2023-04-24 DIAGNOSIS — F411 Generalized anxiety disorder: Secondary | ICD-10-CM

## 2023-04-24 MED ORDER — SUMATRIPTAN SUCCINATE 50 MG PO TABS
ORAL_TABLET | ORAL | 3 refills | Status: DC
Start: 2023-04-24 — End: 2023-09-07
  Filled 2023-04-24: qty 10, fill #0
  Filled 2023-04-27: qty 10, 30d supply, fill #0
  Filled 2023-05-09 – 2023-06-12 (×2): qty 10, 30d supply, fill #1
  Filled 2023-07-11: qty 10, 30d supply, fill #2
  Filled 2023-08-07: qty 10, 30d supply, fill #3

## 2023-04-24 MED ORDER — DIAZEPAM 10 MG PO TABS
10.0000 mg | ORAL_TABLET | Freq: Four times a day (QID) | ORAL | 1 refills | Status: AC | PRN
Start: 2023-04-24 — End: ?
  Filled 2023-04-24: qty 30, 8d supply, fill #0
  Filled 2023-05-09: qty 30, 8d supply, fill #1

## 2023-04-24 NOTE — Assessment & Plan Note (Signed)
Blood pressure is in good control. Continue atenolol 25 mg 1/2 tab daily. 

## 2023-04-24 NOTE — Progress Notes (Signed)
[facility] PRIMARY C[facility]IMARY Trecia Rogers [facility] [address] [place] [address] Dept: [phone] Dept Fax: [phone]  Chronic Care Office Visit  Subjective:    Patient ID: Samantha [name], female    DOB: Nov 13, 1970, 52 y.o..   MRN: [id number]  Chief Complaint  Patient presents with   Follow-up    2 month f/u.   C/o having RT ankle/hip/leg pain x 2 weeks.  Fell while bowling.     History of Present Illness:  Patient is in today for reassessment of chronic medical issues.  Ms. Samantha [name] has a history of hypertension and is managed on atenolol 25 mg 1/2 tab daily.   Ms. Samantha [name] has multiple psychiatric issues including anxiety, PTSD, panic disorder, and ADHD. She is managed on Valium for baseline control of her anxiety and management of insomnia, and Xanax for breakthrough panic issues or acute anxiety. She is on methylphenidate TID for management of her ADHD and daytime hypersomnia. Since her last visit, her father, who suffered form multiple myeloma and subsequent kidney failure, has died. Family dynamics and sequelae of events around her father's death has triggered some increased anxiety in addition to her complicated grief. Ms. Samantha [name] has started attending some group sessions with Hospice.   Ms. Samantha [name] continues to work with urology related to interstitial cystitis, urethral stricture, and hematuria. She has had a recent cystoscopy, urethral dilation, and  bladder hydrodistension. She had a recent ultrasound that raised some concerns regarding kidney issues.  Ms. Samantha [name] suffered a fall while bowling with her son. She has had right ankle and hip pain since that time. She denies any bruising. She has had some difficulty ambulating due to the ankle issues.  Past Medical History: Patient Active Problem List   Diagnosis Date Noted   Cervical spine arthritis 02/25/2022   Leg swelling 10/19/2021   Interstitial cystitis 09/15/2021   Other urethral  stricture, female 09/15/2021   Chronic kidney disease, stage 3a (HCC) 08/20/2021   Prediabetes 08/27/2020   History of [place] spotted fever 02/04/2020   Moderate persistent asthma without complication 08/02/2019   LPRD (laryngopharyngeal reflux disease) 08/02/2019   Chronic rhinitis 08/02/2019   Chronic post-traumatic stress disorder (PTSD) 11/21/2018   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) 11/21/2018   Pulmonary nodules 11/13/2018   Other microscopic hematuria 11/06/2018   Generalized anxiety disorder 07/04/2018   Fecal incontinence 07/02/2018   Leg weakness 06/12/2017   Left ear hearing loss 02/07/2017   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   Borderline hyperlipidemia 11/08/2016   Acute idiopathic gout of multiple sites 11/08/2016   Elbow pain, chronic, right 10/06/2016   Lateral epicondylitis, right elbow 10/06/2016   Fibromyalgia 10/04/2016   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   Migraine headache 07/29/2016   Irritable bowel syndrome with both constipation and diarrhea 07/29/2016   Hives 07/18/2016   Urinary incontinence 06/02/2016   Atherosclerosis of abdominal aorta (HCC) 09/03/2015   Carpal tunnel syndrome 11/19/2014   Primary hypersomnia 11/18/2014   Insomnia 11/18/2014   Lumbar radicular pain 11/18/2014   Chronic pain syndrome 11/18/2014   Cervical disc disorder with radiculopathy 11/18/2014   Chronic female pelvic pain 07/31/2014   Degeneration of intervertebral disc of lumbosacral region 07/31/2014   Rosacea 07/31/2014   Morton's neuroma 07/31/2014   Benign paroxysmal positional vertigo 07/31/2014   Essential hypertension 07/31/2014   History of methicillin resistant Staphylococcus aureus infection 07/09/2014   Past Surgical History:  Procedure Laterality Date   BLADDER SURGERY  age 33- 4   CERVICAL FUSION     08/1998, 03/10/2017   CESAREAN SECTION     COLONOSCOPY     COMBINED HYSTEROSCOPY  DIAGNOSTIC / D&C     CYSTO WITH HYDRODISTENSION N/A 02/22/2023   Procedure: HYDRODISTENSION;  Surgeon: Despina Arias, MD;  Location: [facility];  Service: Urology;  Laterality: N/A;   CYSTOSCOPY W/ RETROGRADES N/A 02/22/2023   Procedure: CYSTOSCOPY WITH BILATERAL RETROGRADE PYELOGRAM;  Surgeon: Despina Arias, MD;  Location: [facility];  Service: Urology;  Laterality: N/A;  60 MINUTES NEEDED FOR CASE   CYSTOSCOPY WITH URETHRAL DILATATION N/A 02/22/2023   Procedure: URETHRAL DILATATION;  Surgeon: Despina Arias, MD;  Location: [facility];  Service: Urology;  Laterality: N/A;   HAND SURGERY     2012, 2013   LAPAROSCOPIC ENDOMETRIOSIS FULGURATION Right    LESION REMOVAL  08/23/2012   Procedure: LESION REMOVAL HAND;  Surgeon: Wyn Forster., MD;  Location: [facility];  Service: Orthopedics;  Laterality: Left;   ROTATOR CUFF REPAIR     UPPER GASTROINTESTINAL ENDOSCOPY     Family History  Problem Relation Age of Onset   Cancer Father        Multiple myeloma   ADD / ADHD Brother    Stomach cancer Maternal Grandfather    Colon cancer Neg Hx    Esophageal cancer Neg Hx    Rectal cancer Neg Hx    Outpatient Medications Prior to Visit  Medication Sig Dispense Refill   albuterol (VENTOLIN HFA) 108 (90 Base) MCG/ACT inhaler INHALE 2 PUFFS BY MOUTH EVERY 4 HOURS AS NEEDED FOR WHEEZING FOR SHORTNESS OF BREATH 18 g 0   ALPRAZolam (XANAX) 1 MG tablet Take 1 tablet (1 mg total) by mouth 2 (two) times daily as needed. 20 tablet 2   Alum Hydroxide-Mag Trisilicate (GAVISCON) 80-14.2 MG CHEW Chew 2 tablets by mouth as needed (acid reflux).     aspirin EC 81 MG tablet Take 81 mg by mouth daily. Swallow whole.     atenolol (TENORMIN) 25 MG tablet TAKE 1 TABLET BY MOUTH ONCE DAILY AS NEEDED (Patient taking differently: Take 12.5 mg by mouth daily.) 90 tablet 3   AUVI-Q 0.3 MG/0.3ML SOAJ injection Inject 0.3 mg into the muscle as needed for anaphylaxis. As directed for life-threatening allergic  reactions 2 each 1   azelastine (ASTELIN) 0.1 % nasal spray Place 2 sprays into each nostril 1-2 times per day. 30 mL 5   benzonatate (TESSALON) 100 MG capsule Take 1 capsule (100 mg total) by mouth 3 (three) times daily as needed for cough. 90 capsule 1   budesonide (PULMICORT) 0.25 MG/2ML nebulizer solution Take 0.25 mg by nebulization 2 (two) times daily as needed (Asthma).     Cholecalciferol (VITAMIN D) 50 MCG (2000 UT) CAPS Take 2,000 Units by mouth daily.     cimetidine (TAGAMET) 200 MG tablet Take 200 mg by mouth 2 (two) times daily.     diazepam (VALIUM) 10 MG tablet Take 1 tablet (10 mg total) by mouth every 6 (six) hours as needed for anxiety or sleep (vertigo/muscle spasms). 30 tablet 1   diphenhydrAMINE (BENADRYL) 12.5 MG chewable tablet Chew 25 mg by mouth 4 (four) times daily as needed for allergies.     EPINEPHrine (EPIPEN 2-PAK) 0.3 mg/0.3 mL IJ SOAJ injection INJECT CONTENTS OF 1 PEN AS NEEDED FOR ALLERGIC REACTION 2 each 2   EPINEPHrine (PRIMATENE MIST) 0.125 MG/ACT AERO Inhale 1 puff into the lungs  as needed (Asthma).     estradiol (ESTRACE) 0.1 MG/GM vaginal cream Place 1 Applicatorful vaginally once a week.     famotidine (PEPCID AC MAXIMUM STRENGTH) 20 MG tablet Take 20 mg by mouth daily as needed (Hives/itching).     guaifenesin (HUMIBID E) 400 MG TABS tablet Take 400-800 mg by mouth every 4 (four) hours as needed (Cough/mucus).     ipratropium (ATROVENT) 0.06 % nasal spray Place 2 sprays into each nostril 1-2 times per day. 15 mL 5   levalbuterol (XOPENEX HFA) 45 MCG/ACT inhaler INHALE 2 PUFFS BY MOUTH EVERY 4 HOURS AS NEEDED FOR WHEEZING 15 g 0   magnesium oxide (MAG-OX) 400 (240 Mg) MG tablet Take 400 mg by mouth daily.     methylphenidate (RITALIN) 10 MG tablet Take 1 tablet (10 mg total) by mouth 3 (three) times daily with meals. 90 tablet 0   Olopatadine HCl (PATADAY) 0.2 % SOLN Place 1 drop into both eyes 2 (two) times daily as needed (eye allergies).     rosuvastatin  (CRESTOR) 10 MG tablet Take 1 tablet by mouth once daily 90 tablet 3   SUMAtriptan (IMITREX) 50 MG tablet Take 1 tablet by mouth once as needed. May repeat in 2 hours if headache persists or recurs. 10 tablet 3   cephALEXin (KEFLEX) 500 MG capsule Take 1 capsule (500 mg total) by mouth 2 (two) times daily. 6 capsule 0   Cranberry-Vitamin C (AZO CRANBERRY URINARY TRACT) 250-60 MG CAPS Take 1 tablet by mouth daily as needed (urinary pain).     Meth-Hyo-M Bl-Na Phos-Ph Sal (URIBEL) 118 MG CAPS Take 1 capsule (118 mg total) by mouth every 6 (six) hours as needed. (Patient not taking: Reported on 02/20/2023) 30 capsule 1   Methenamine-Sodium Salicylate (CYSTEX URINARY PAIN RELIEF) 162-162.5 MG TABS Take 1-2 tablets by mouth 3 (three) times daily as needed (urinary pain). (Patient not taking: Reported on 02/20/2023)     oxyCODONE (ROXICODONE) 5 MG immediate release tablet Take 1 tablet (5 mg total) by mouth every 6 (six) hours as needed for severe pain or breakthrough pain. 12 tablet 0   No facility-administered medications prior to visit.   Allergies  Allergen Reactions   Acetaminophen Rash    Blistering lymph nodes, and blistering rash    Airduo Digihaler [Fluticasone-Salmeterol] Hives, Shortness Of Breath and Itching    Asthma attack   Allegra [Fexofenadine] Cough    She reports tachycardia and elevated BP and low Oxygen level after taking this   Aspirin Shortness Of Breath    High dose brings asthma attack    B-12 [Cyanocobalamin] Shortness Of Breath and Itching    Drop O2   Baclofen Other (See Comments)    Headache, muscle weakness, unable to urinate, unable to walk    Bactrim Anaphylaxis   Bee Venom Hives, Itching, Other (See Comments) and Rash   Benadryl [Diphenhydramine] Anaphylaxis    Only the liquid form she can not take because it causes throat closing,  DO NOT USE IN IV FORM - tablets are ok   Ciprofloxacin Shortness Of Breath    Blurred Vision, joint pain, headaches, dropped BP    Clindamycin Shortness Of Breath   Coconut (Cocos Nucifera) Hives, Itching and Other (See Comments)    Coconut Oil    Coq-10 [Coenzyme Q10] Hives, Shortness Of Breath and Itching   Cymbalta [Duloxetine Hcl] Anaphylaxis and Shortness Of Breath   Darvon [Propoxyphene] Hives, Shortness Of Breath and Itching    Darvocet*  Flunisolide Shortness Of Breath and Other (See Comments)    Aerospan Nasal Inhaler* - caused blood clots in nose    Formaldehyde Anaphylaxis   Hydrocodone-Acetaminophen Hives, Shortness Of Breath and Itching    Vicodin, Norco   Nucynta [Tapentadol] Shortness Of Breath    Coughing, SOB    Other Hives, Itching, Rash and Other (See Comments)    Insect stings/ Bites  Wine spirit  Peanuts - Bring on Asthma attack   Cold Temperatures  - causes hives, and muscle spasms    Peanut-Containing Drug Products Hives, Shortness Of Breath, Itching, Swelling and Other (See Comments)   Potassium-Containing Compounds Anaphylaxis, Itching, Swelling and Palpitations    Tables, and Oral Solution    Ramipril Anaphylaxis   Skelaxin [Metaxalone] Hives, Shortness Of Breath and Itching    Brings on Asthma Attacks    Sulfa Antibiotics Anaphylaxis   Sulfamethoxazole-Trimethoprim Anaphylaxis   Tezspire [Tezepelumab] Shortness Of Breath, Nausea And Vomiting, Swelling, Rash and Other (See Comments)    Muscle weakness, joint pain, sore throat   Triamcinolone Other (See Comments)    Kenalog Injection - muscle weakness, spasms, BP drop, lethargic, tongue swelling   Nasacort causes breathing issues    Voltaren [Diclofenac] Shortness Of Breath, Swelling and Rash    Gel*    Zofran [Ondansetron Hcl] Hives and Itching    Blurred Vision    Aspartame Other (See Comments)    Severe Migraines   Bacitra-Neomycin-Polymyxin-Hc Rash   Cranberry Extract Other (See Comments)    Throat itching / burning    Sulfur Hives and Itching   Aciphex [Rabeprazole]     Muscle weakness, headaches    Addyi [Flibanserin]      Decreased sex drive to 0   Alvesco [Ciclesonide] Other (See Comments)    Sores in mouth, coughing    Ambien [Zolpidem]     Mood Change, Aggressive, Insomnia    Amoxicillin     Pt unsure of Allergy    Ativan [Lorazepam] Other (See Comments)    Hallucination     Aygestin [Norethindrone]     Muscle weakness, abdominal pain   Celebrex [Celecoxib] Other (See Comments)    Possible Reaction - Not taken    Demeclocycline     Tetracycline Family-  GI Upset   Dexilant [Dexlansoprazole]     Severe joint pain    Doxycycline     Possibly joint pain or itch    Effexor Xr [Venlafaxine Hcl Er]     Migraines, weight gain, trouble sleeping   Elavil [Amitriptyline] Nausea And Vomiting    Headache, and muscle weakness   Erythromycin     Upset stomach   Erythromycin Base     Stomach Upset    Fentanyl Dermatitis    Patches*    Fioricet [Butalbital-Apap-Caffeine]     Acetaminophen    Hydrocodone Itching   Hydrocodone Bit-Homatrop Mbr Hives and Itching    Not true allergy. "irritated"   Hydromorphone Nausea Only   Ibuprofen Itching    Throat itching    Inderal [Propranolol]     Agitation, difficulty sleeping    Indomethacin Hives and Itching   Latex Itching    Skin Peeling    Levofloxacin     Weakness, myalgia   Lyrica [Pregabalin]     Muscle weakness, Causes Falls    Maxalt [Rizatriptan] Other (See Comments)    Pt was knocked out from med, extreme drowsiness and did not help migraine    Meloxicam Itching    Increases BP  Morphine Sulfate Er     Severe insomnia, does not help pain    Naproxen Hives and Itching    Head to Toe    Neurontin [Gabapentin]     Depression    Nexium [Esomeprazole]     Bad headaches    Nortriptyline     Depression, decreased sex drive    Omeprazole-Sodium Bicarbonate     Chest pain-sore all over-HA   Ranitidine     Severe joint pain   Robaxin [Methocarbamol]     Made her very tired and unable to sleep at night Muscle weakness, fatigue     Tape Dermatitis   Tetanus Toxoid Other (See Comments)    Tetanus injection - blistering rash, swelling at injection site, turn into baseball size lump, joint pain    Tetanus-Diphtheria Toxoids Td Hives    Robust local reaction   Tetracyclines & Related     GI Upset    Tizanidine Other (See Comments)    "knocked her out"- had a rebound migraine, causes muscles to lock, and bedridden    Topamax [Topiramate]     Lethargic, daytime drowsiness, insomnia    Trazodone     Pt thinks it did not help - unsure of reaction    Tums [Calcium Carbonate] Other (See Comments)    Blistering Rash with high doses    Wound Dressing Adhesive Itching and Dermatitis    Bandages    Etodolac Rash    Blistering Rash, and swollen lymph nodes    Flexeril [Cyclobenzaprine] Palpitations    Irregular heartbeat    Neosporin [Neomycin-Bacitracin Zn-Polymyx] Rash    Blistering    Objective:   Today's Vitals   04/24/23 0959  BP: 120/70  Pulse: 73  Temp: 97.6 F (36.4 C)  TempSrc: Temporal  SpO2: 95%  Weight: 173 lb 12.8 oz (78.8 kg)  Height: 5\' 2"  (1.575 m)   Body mass index is 31.79 kg/m.   General: Well developed, well nourished. No acute distress. Extremities: ROM limited due to pain. No joint swelling. There is tenderness over both malleoli. Drawer is neg. Tilt is likely   negative. Psych: Alert and oriented. Normal mood and affect.  Health Maintenance Due  Topic Date Due   HIV Screening  Never done   Imaging: Renal Ultrasound (04/06/2023) IMPRESSION: 1. No hydronephrosis.  No acute finding. 2. On the left, there are hypo to anechoic masses that may reflect cysts but are incompletely characterized and were not visualized on the prior CT. On the right, there are linear echogenic areas that nonspecific. One in the mid to upper pole measures 2 x 1.6 x 1.7 cm. This is also not evident on the prior CT. 3. Recommend follow-up renal MRI or renal CT without and with contrast for further assessment these  findings.  Assessment & Plan:   Problem List Items Addressed This Visit       Cardiovascular and Mediastinum   Essential hypertension - Primary    Blood pressure is in good control. Continue atenolol 25 mg 1/2 tab daily.        Other   Bilateral renal masses    Noted on recent ultrasound. Ms. Samantha [name] should follow-up with Dr. Arrie Aran regarding MRI or CT scan of the kidney's to follow up on this.      Chronic post-traumatic stress disorder (PTSD)    Recent exacerbation of symptoms due to father's death and family discord. I suggested she may benefit from reading [employer] by Dr. Butch Penny. She  will continue her use of diazepam and alprazolam as needed.      Other Visit Diagnoses     Acute right ankle pain       Likely sprained. I will check an x-ray to make sure there is no sign of fracture. She shodul continue use of splint and ACE wrap.   Relevant Orders   DG Ankle Complete Right      Return in about 2 months (around 06/24/2023) for Reassessment.   Loyola Mast, MD

## 2023-04-24 NOTE — Assessment & Plan Note (Signed)
Recent exacerbation of symptoms due to father's death and family discord. I suggested she may benefit from reading [employer]oxic Parents by Dr. Butch Penny. She will continue her use of diazepam and alprazolam as needed.

## 2023-04-24 NOTE — Assessment & Plan Note (Signed)
Noted on recent ultrasound. Samantha [name] should follow-up with Dr. Arrie Aran regarding MRI or CT scan of the kidney's to follow up on this.

## 2023-04-25 ENCOUNTER — Other Ambulatory Visit: Payer: Self-pay

## 2023-04-27 ENCOUNTER — Other Ambulatory Visit: Payer: Self-pay | Admitting: Urology

## 2023-04-27 ENCOUNTER — Ambulatory Visit (INDEPENDENT_AMBULATORY_CARE_PROVIDER_SITE_OTHER): Payer: No Typology Code available for payment source | Admitting: Urology

## 2023-04-27 ENCOUNTER — Other Ambulatory Visit (HOSPITAL_BASED_OUTPATIENT_CLINIC_OR_DEPARTMENT_OTHER): Payer: Self-pay

## 2023-04-27 ENCOUNTER — Encounter: Payer: Self-pay | Admitting: Urology

## 2023-04-27 VITALS — BP 125/81 | HR 65 | Ht 62.0 in | Wt 170.0 lb

## 2023-04-27 DIAGNOSIS — R3129 Other microscopic hematuria: Secondary | ICD-10-CM | POA: Diagnosis not present

## 2023-04-27 DIAGNOSIS — N3010 Interstitial cystitis (chronic) without hematuria: Secondary | ICD-10-CM | POA: Diagnosis not present

## 2023-04-27 DIAGNOSIS — R310 Gross hematuria: Secondary | ICD-10-CM

## 2023-04-27 DIAGNOSIS — N3941 Urge incontinence: Secondary | ICD-10-CM

## 2023-04-27 DIAGNOSIS — N2889 Other specified disorders of kidney and ureter: Secondary | ICD-10-CM

## 2023-04-27 LAB — URINALYSIS, ROUTINE W REFLEX MICROSCOPIC
Bilirubin, UA: NEGATIVE
Glucose, UA: NEGATIVE
Ketones, UA: NEGATIVE
Leukocytes,UA: NEGATIVE
Nitrite, UA: NEGATIVE
Protein,UA: NEGATIVE
Specific Gravity, UA: 1.01 (ref 1.005–1.030)
Urobilinogen, Ur: 0.2 mg/dL (ref 0.2–1.0)
pH, UA: 7 (ref 5.0–7.5)

## 2023-04-27 LAB — MICROSCOPIC EXAMINATION

## 2023-04-27 NOTE — Progress Notes (Signed)
Assessment: 1. Gross hematuria   2. Microscopic hematuria   3. Urge incontinence   4. Interstitial cystitis   5. Renal mass     Plan: I personally reviewed the patient's chart including provider notes, lab and imaging results. I reviewed the patient records from [facility] and [facility]. I personally reviewed the ultrasound from 04/13/2023 with results as noted below. Recommend further evaluation with CT abdomen with and without IV contrast Hematuria profile sent today Return to office after CT done  Chief Complaint:  Chief Complaint  Patient presents with   Hematuria    History of Present Illness:  Samantha [name] is a 52 y.o. female who is seen for evaluation of microscopic hematuria, interstitial cystitis, urethral stricture, urge incontinence, and possible renal mass. She has previously been followed at [facility] in [facility] and [facility]. She was last seen by me in April 2022.  She has previously been evaluated for microscopic hematuria by Dr. Lindley Magnus in May 2017.  At that time cystoscopy showed a normal bladder.  She has had continued microscopic hematuria.  Hematuria profile from 2/22 showed no malignant cells or dysplasia, blood and or erythrocytic casts indicating glomerular and/or renal tubular bleeding.  She was referred to nephrology for further evaluation.   She has had an extensive workup with autoimmune and genetic testing.  She has a history of interstitial cystitis which was managed with dietary modification.  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  She has had symptoms of frequency, urgency, and nocturia as well as sensation of incomplete emptying, hesitancy, and urge incontinence.  She was previously unable to tolerate Vesicare due to side effects of blurred vision, headaches, fatigue, constipation, and dry mouth.  She was given a trial of Gemtesa in April 2022.  CT  imaging from April 2024 showed no renal or ureteral calculi and no evidence of obstruction. She underwent cystoscopy with retrograde pyelograms, urethral dilation, and hydrodistention by Dr.Machen on 02/22/2023.  No filling defects or obstruction was seen on the pyelograms.  Her bladder was unremarkable without ulcerations or glomerulations.  Recent renal ultrasound from 04/13/2023 showed hypo to anechoic masses in the left kidney possibly representing cyst but incompletely characterized.  She reports 7 episodes of gross hematuria this year.  Her episodes typically last 1-2 days and resolve spontaneously.  She has associated symptoms of suprapubic pain, low back pain, dysuria, increased frequency, urgency.  No fevers or chills. She is not have significant urinary symptoms in between these episodes.   Past Medical History:  Past Medical History:  Diagnosis Date   ADD (attention deficit disorder)    Allergy    Anaphylaxis    Anemia    Anxiety    Arthritis    Asthma    Chronic back pain    Chronic kidney disease    Chronic neck pain    Complication of anesthesia    Waking up before procedure was over.   Degenerative disc disease at L5-S1 level    Dysrhythmia    Had  "abnormal beats" d/t B/P med she was on. B/P med reduced.   Endometriosis    Familial cold urticaria    Family history of adverse reaction to anesthesia    Fibromyalgia    GERD (gastroesophageal reflux disease)    Headache    Migraines   Hernia    Hyperlipidemia    Hypersomnia    Hypertension    IBS (irritable  bowel syndrome)    IC (interstitial cystitis)    IgG Gliadin antibody positive    Low blood potassium    Migraines    Multiple allergies    OCD (obsessive compulsive disorder)    Raynaud disease    Raynaud's disease    Umbilical hernia    Vertigo    Vertigo     Past Surgical History:  Past Surgical History:  Procedure Laterality Date   BLADDER SURGERY     age 20- 63   CERVICAL FUSION     08/1998,  03/10/2017   CESAREAN SECTION     COLONOSCOPY     COMBINED HYSTEROSCOPY DIAGNOSTIC / D&C     CYSTO WITH HYDRODISTENSION N/A 02/22/2023   Procedure: HYDRODISTENSION;  Surgeon: Despina Arias, MD;  Location: [facility];  Service: Urology;  Laterality: N/A;   CYSTOSCOPY W/ RETROGRADES N/A 02/22/2023   Procedure: CYSTOSCOPY WITH BILATERAL RETROGRADE PYELOGRAM;  Surgeon: Despina Arias, MD;  Location: [facility];  Service: Urology;  Laterality: N/A;  60 MINUTES NEEDED FOR CASE   CYSTOSCOPY WITH URETHRAL DILATATION N/A 02/22/2023   Procedure: URETHRAL DILATATION;  Surgeon: Despina Arias, MD;  Location: [facility];  Service: Urology;  Laterality: N/A;   HAND SURGERY     2012, 2013   LAPAROSCOPIC ENDOMETRIOSIS FULGURATION Right    LESION REMOVAL  08/23/2012   Procedure: LESION REMOVAL HAND;  Surgeon: Wyn Forster., MD;  Location: [facility];  Service: Orthopedics;  Laterality: Left;   ROTATOR CUFF REPAIR     UPPER GASTROINTESTINAL ENDOSCOPY      Allergies:  Allergies  Allergen Reactions   Acetaminophen Rash    Blistering lymph nodes, and blistering rash    Airduo Digihaler [Fluticasone-Salmeterol] Hives, Shortness Of Breath and Itching    Asthma attack   Allegra [Fexofenadine] Cough    She reports tachycardia and elevated BP and low Oxygen level after taking this   Aspirin Shortness Of Breath    High dose brings asthma attack    B-12 [Cyanocobalamin] Shortness Of Breath and Itching    Drop O2   Baclofen Other (See Comments)    Headache, muscle weakness, unable to urinate, unable to walk    Bactrim Anaphylaxis   Bee Venom Hives, Itching, Other (See Comments) and Rash   Benadryl [Diphenhydramine] Anaphylaxis    Only the liquid form she can not take because it causes throat closing,  DO NOT USE IN IV FORM - tablets are ok   Ciprofloxacin Shortness Of Breath    Blurred Vision, joint pain, headaches, dropped BP   Clindamycin Shortness Of Breath   Coconut (Cocos Nucifera)  Hives, Itching and Other (See Comments)    Coconut Oil    Coq-10 [Coenzyme Q10] Hives, Shortness Of Breath and Itching   Cymbalta [Duloxetine Hcl] Anaphylaxis and Shortness Of Breath   Darvon [Propoxyphene] Hives, Shortness Of Breath and Itching    Darvocet*   Flunisolide Shortness Of Breath and Other (See Comments)    Aerospan Nasal Inhaler* - caused blood clots in nose    Formaldehyde Anaphylaxis   Hydrocodone-Acetaminophen Hives, Shortness Of Breath and Itching    Vicodin, Norco   Nucynta [Tapentadol] Shortness Of Breath    Coughing, SOB    Other Hives, Itching, Rash and Other (See Comments)    Insect stings/ Bites  Wine spirit  Peanuts - Bring on Asthma attack   Cold Temperatures  - causes hives, and muscle spasms    Peanut-Containing Drug Products Hives,  Shortness Of Breath, Itching, Swelling and Other (See Comments)   Potassium-Containing Compounds Anaphylaxis, Itching, Swelling and Palpitations    Tables, and Oral Solution    Ramipril Anaphylaxis   Skelaxin [Metaxalone] Hives, Shortness Of Breath and Itching    Brings on Asthma Attacks    Sulfa Antibiotics Anaphylaxis   Sulfamethoxazole-Trimethoprim Anaphylaxis   Tezspire [Tezepelumab] Shortness Of Breath, Nausea And Vomiting, Swelling, Rash and Other (See Comments)    Muscle weakness, joint pain, sore throat   Triamcinolone Other (See Comments)    Kenalog Injection - muscle weakness, spasms, BP drop, lethargic, tongue swelling   Nasacort causes breathing issues    Voltaren [Diclofenac] Shortness Of Breath, Swelling and Rash    Gel*    Zofran [Ondansetron Hcl] Hives and Itching    Blurred Vision    Aspartame Other (See Comments)    Severe Migraines   Bacitra-Neomycin-Polymyxin-Hc Rash   Cranberry Extract Other (See Comments)    Throat itching / burning    Sulfur Hives and Itching   Aciphex [Rabeprazole]     Muscle weakness, headaches    Addyi [Flibanserin]     Decreased sex drive to 0   Alvesco [Ciclesonide]  Other (See Comments)    Sores in mouth, coughing    Ambien [Zolpidem]     Mood Change, Aggressive, Insomnia    Amoxicillin     Pt unsure of Allergy    Ativan [Lorazepam] Other (See Comments)    Hallucination     Aygestin [Norethindrone]     Muscle weakness, abdominal pain   Celebrex [Celecoxib] Other (See Comments)    Possible Reaction - Not taken    Demeclocycline     Tetracycline Family-  GI Upset   Dexilant [Dexlansoprazole]     Severe joint pain    Doxycycline     Possibly joint pain or itch    Effexor Xr [Venlafaxine Hcl Er]     Migraines, weight gain, trouble sleeping   Elavil [Amitriptyline] Nausea And Vomiting    Headache, and muscle weakness   Erythromycin     Upset stomach   Erythromycin Base     Stomach Upset    Fentanyl Dermatitis    Patches*    Fioricet [Butalbital-Apap-Caffeine]     Acetaminophen    Hydrocodone Itching   Hydrocodone Bit-Homatrop Mbr Hives and Itching    Not true allergy. "irritated"   Hydromorphone Nausea Only   Ibuprofen Itching    Throat itching    Inderal [Propranolol]     Agitation, difficulty sleeping    Indomethacin Hives and Itching   Latex Itching    Skin Peeling    Levofloxacin     Weakness, myalgia   Lyrica [Pregabalin]     Muscle weakness, Causes Falls    Maxalt [Rizatriptan] Other (See Comments)    Pt was knocked out from med, extreme drowsiness and did not help migraine    Meloxicam Itching    Increases BP   Morphine Sulfate Er     Severe insomnia, does not help pain    Naproxen Hives and Itching    Head to Toe    Neurontin [Gabapentin]     Depression    Nexium [Esomeprazole]     Bad headaches    Nortriptyline     Depression, decreased sex drive    Omeprazole-Sodium Bicarbonate     Chest pain-sore all over-HA   Ranitidine     Severe joint pain   Robaxin [Methocarbamol]     Made her very tired  and unable to sleep at night Muscle weakness, fatigue    Tape Dermatitis   Tetanus Toxoid Other (See Comments)     Tetanus injection - blistering rash, swelling at injection site, turn into baseball size lump, joint pain    Tetanus-Diphtheria Toxoids Td Hives    Robust local reaction   Tetracyclines & Related     GI Upset    Tizanidine Other (See Comments)    "knocked her out"- had a rebound migraine, causes muscles to lock, and bedridden    Topamax [Topiramate]     Lethargic, daytime drowsiness, insomnia    Trazodone     Pt thinks it did not help - unsure of reaction    Tums [Calcium Carbonate] Other (See Comments)    Blistering Rash with high doses    Wound Dressing Adhesive Itching and Dermatitis    Bandages    Etodolac Rash    Blistering Rash, and swollen lymph nodes    Flexeril [Cyclobenzaprine] Palpitations    Irregular heartbeat    Neosporin [Neomycin-Bacitracin Zn-Polymyx] Rash    Blistering     Family History:  Family History  Problem Relation Age of Onset   Cancer Father        Multiple myeloma   ADD / ADHD Brother    Stomach cancer Maternal Grandfather    Colon cancer Neg Hx    Esophageal cancer Neg Hx    Rectal cancer Neg Hx     Social History:  Social History   Tobacco Use   Smoking status: Never   Smokeless tobacco: Never  Vaping Use   Vaping status: Never Used  Substance Use Topics   Alcohol use: Yes    Comment: occasional   Drug use: No    Review of symptoms:  Constitutional:  Negative for unexplained weight loss, night sweats, fever, chills ENT:  Negative for nose bleeds, sinus pain, painful swallowing CV:  Negative for chest pain, shortness of breath, exercise intolerance, palpitations, loss of consciousness Resp:  Negative for cough, wheezing, shortness of breath GI:  Negative for nausea, vomiting, diarrhea, bloody stools GU:  Positives noted in HPI; otherwise negative Neuro:  Negative for seizures, poor balance, limb weakness, slurred speech Psych:  Negative for lack of energy, depression, anxiety Endocrine:  Negative for polydipsia, polyuria,  symptoms of hypoglycemia (dizziness, hunger, sweating) Hematologic:  Negative for anemia, purpura, petechia, prolonged or excessive bleeding, use of anticoagulants  Allergic:  Negative for difficulty breathing or choking as a result of exposure to anything; no shellfish allergy; no allergic response (rash/itch) to materials, foods  Physical exam: BP 125/81   Pulse 65   Ht 5\' 2"  (1.575 m)   Wt 170 lb (77.1 kg)   LMP  (LMP Unknown)   BMI 31.09 kg/m  GENERAL APPEARANCE:  Well appearing, well developed, well nourished, NAD HEENT: Atraumatic, Normocephalic, oropharynx clear. NECK: Supple without lymphadenopathy or thyromegaly. LUNGS: Clear to auscultation bilaterally. HEART: Regular Rate and Rhythm without murmurs, gallops, or rubs. ABDOMEN: Soft, non-tender, No Masses. EXTREMITIES: Moves all extremities well.  Without clubbing, cyanosis, or edema. NEUROLOGIC:  Alert and oriented x 3, normal gait, CN II-XII grossly intact.  MENTAL STATUS:  Appropriate. BACK:  Non-tender to palpation.  No CVAT SKIN:  Warm, dry and intact.    Results: U/A: 0-5 WBC, 3-10 RBC

## 2023-05-02 ENCOUNTER — Ambulatory Visit (HOSPITAL_BASED_OUTPATIENT_CLINIC_OR_DEPARTMENT_OTHER)
Admission: RE | Admit: 2023-05-02 | Discharge: 2023-05-02 | Disposition: A | Discharge status: Home / Self Care | Payer: No Typology Code available for payment source | Source: Ambulatory Visit | Attending: Urology | Admitting: Urology

## 2023-05-02 DIAGNOSIS — N2889 Other specified disorders of kidney and ureter: Secondary | ICD-10-CM | POA: Insufficient documentation

## 2023-05-02 DIAGNOSIS — R310 Gross hematuria: Secondary | ICD-10-CM | POA: Insufficient documentation

## 2023-05-02 MED ORDER — IOHEXOL 300 MG/ML  SOLN
100.0000 mL | Freq: Once | INTRAMUSCULAR | Status: AC | PRN
  Administered 2023-05-02: 100 mL via INTRAVENOUS

## 2023-05-03 ENCOUNTER — Other Ambulatory Visit: Payer: Self-pay | Admitting: Family Medicine

## 2023-05-03 ENCOUNTER — Encounter: Payer: Self-pay | Admitting: Urology

## 2023-05-03 DIAGNOSIS — F908 Attention-deficit hyperactivity disorder, other type: Secondary | ICD-10-CM

## 2023-05-04 ENCOUNTER — Other Ambulatory Visit (HOSPITAL_BASED_OUTPATIENT_CLINIC_OR_DEPARTMENT_OTHER): Payer: Self-pay

## 2023-05-04 MED ORDER — METHYLPHENIDATE HCL 10 MG PO TABS
10.0000 mg | ORAL_TABLET | Freq: Three times a day (TID) | ORAL | 0 refills | Status: AC
Start: 2023-05-04 — End: 2023-06-04
  Filled 2023-05-04: qty 90, 30d supply, fill #0

## 2023-05-09 ENCOUNTER — Other Ambulatory Visit: Payer: Self-pay | Admitting: Family Medicine

## 2023-05-09 DIAGNOSIS — F4001 Agoraphobia with panic disorder: Secondary | ICD-10-CM

## 2023-05-10 ENCOUNTER — Other Ambulatory Visit: Payer: Self-pay

## 2023-05-10 ENCOUNTER — Other Ambulatory Visit (HOSPITAL_BASED_OUTPATIENT_CLINIC_OR_DEPARTMENT_OTHER): Payer: Self-pay

## 2023-05-10 MED ORDER — ALPRAZOLAM 1 MG PO TABS
1.0000 mg | ORAL_TABLET | Freq: Two times a day (BID) | ORAL | 2 refills | Status: DC | PRN
Start: 2023-05-10 — End: 2023-07-11
  Filled 2023-05-10: qty 20, 10d supply, fill #0
  Filled 2023-06-12: qty 20, 10d supply, fill #1
  Filled 2023-06-27: qty 20, 10d supply, fill #2

## 2023-05-11 ENCOUNTER — Encounter: Payer: Self-pay | Admitting: Urology

## 2023-05-23 ENCOUNTER — Other Ambulatory Visit (HOSPITAL_BASED_OUTPATIENT_CLINIC_OR_DEPARTMENT_OTHER): Payer: Self-pay

## 2023-05-26 ENCOUNTER — Other Ambulatory Visit: Payer: Self-pay

## 2023-05-26 ENCOUNTER — Other Ambulatory Visit (HOSPITAL_BASED_OUTPATIENT_CLINIC_OR_DEPARTMENT_OTHER): Payer: Self-pay

## 2023-05-29 ENCOUNTER — Other Ambulatory Visit (HOSPITAL_BASED_OUTPATIENT_CLINIC_OR_DEPARTMENT_OTHER): Payer: Self-pay

## 2023-06-08 ENCOUNTER — Ambulatory Visit (INDEPENDENT_AMBULATORY_CARE_PROVIDER_SITE_OTHER): Payer: No Typology Code available for payment source | Admitting: Urology

## 2023-06-08 ENCOUNTER — Encounter: Payer: Self-pay | Admitting: Urology

## 2023-06-08 VITALS — BP 151/90 | HR 62 | Ht 63.0 in | Wt 162.0 lb

## 2023-06-08 DIAGNOSIS — Z87898 Personal history of other specified conditions: Secondary | ICD-10-CM

## 2023-06-08 DIAGNOSIS — N3941 Urge incontinence: Secondary | ICD-10-CM

## 2023-06-08 DIAGNOSIS — N3010 Interstitial cystitis (chronic) without hematuria: Secondary | ICD-10-CM | POA: Diagnosis not present

## 2023-06-08 DIAGNOSIS — R3129 Other microscopic hematuria: Secondary | ICD-10-CM

## 2023-06-08 DIAGNOSIS — R310 Gross hematuria: Secondary | ICD-10-CM

## 2023-06-08 NOTE — Progress Notes (Signed)
Assessment: 1. Microscopic hematuria   2. Urge incontinence   3. Interstitial cystitis   4. Gross hematuria     Plan: I reviewed the results of the CT study and the hematuria profile with the patient today. Return to office in 6 months  Chief Complaint:  Chief Complaint  Patient presents with   Hematuria    History of Present Illness:  Samantha [name] is a 52 y.o. female who is seen for further evaluation of microscopic hematuria, interstitial cystitis, urethral stricture, urge incontinence, and possible renal mass. She has previously been followed at [facility] in [facility] and [facility]. She was last seen by me in April 2022.  She has previously been evaluated for microscopic hematuria by Dr. Lindley Magnus in May 2017.  At that time cystoscopy showed a normal bladder.  She has had continued microscopic hematuria.  Hematuria profile from 2/22 showed no malignant cells or dysplasia, blood and or erythrocytic casts indicating glomerular and/or renal tubular bleeding.  She was referred to nephrology for further evaluation.   She has had an extensive workup with autoimmune and genetic testing.  She has a history of interstitial cystitis which was managed with dietary modification.  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  She has had symptoms of frequency, urgency, and nocturia as well as sensation of incomplete emptying, hesitancy, and urge incontinence.  She was previously unable to tolerate Vesicare due to side effects of blurred vision, headaches, fatigue, constipation, and dry mouth.  She was given a trial of Gemtesa in April 2022.  CT imaging from April 2024 showed no renal or ureteral calculi and no evidence of obstruction. She underwent cystoscopy with retrograde pyelograms, urethral dilation, and hydrodistention by Dr.Machen on 02/22/2023.  No filling defects or obstruction was seen on the pyelograms.  Her bladder  was unremarkable without ulcerations or glomerulations.  Recent renal ultrasound from 04/13/2023 showed hypo to anechoic masses in the left kidney possibly representing cyst but incompletely characterized.  She reports 7 episodes of gross hematuria this year.  Her episodes typically last 1-2 days and resolve spontaneously.  She has associated symptoms of suprapubic pain, low back pain, dysuria, increased frequency, urgency.  No fevers or chills. She does not have significant urinary symptoms in between these episodes.  CT abdomen with and without contrast from 05/02/2023 showed no obvious renal masses and a punctate left renal stone. Hematuria profile from 04/27/2023 was negative for malignant cells or dysplasia and showed isomorphic erythrocytes indicating lower urinary tract or uroepithelial bleeding.  She turns today for follow-up.  Her urinary symptoms are stable.  No episodes of gross hematuria since her last visit.  No flank pain or dysuria.  She is currently recovering from COVID.  Portions of the above documentation were copied from a prior visit for review purposes only.    Past Medical History:  Past Medical History:  Diagnosis Date   ADD (attention deficit disorder)    Allergy    Anaphylaxis    Anemia    Anxiety    Arthritis    Asthma    Chronic back pain    Chronic kidney disease    Chronic neck pain    Complication of anesthesia    Waking up before procedure was over.   Degenerative disc disease at L5-S1 level    Dysrhythmia    Had  "abnormal beats" d/t B/P med she was on. B/P med reduced.   Endometriosis  Familial cold urticaria    Family history of adverse reaction to anesthesia    Fibromyalgia    GERD (gastroesophageal reflux disease)    Headache    Migraines   Hernia    Hyperlipidemia    Hypersomnia    Hypertension    IBS (irritable bowel syndrome)    IC (interstitial cystitis)    IgG Gliadin antibody positive    Low blood potassium    Migraines     Multiple allergies    OCD (obsessive compulsive disorder)    Raynaud disease    Raynaud's disease    Umbilical hernia    Vertigo    Vertigo     Past Surgical History:  Past Surgical History:  Procedure Laterality Date   BLADDER SURGERY     age 60- 6   CERVICAL FUSION     08/1998, 03/10/2017   CESAREAN SECTION     COLONOSCOPY     COMBINED HYSTEROSCOPY DIAGNOSTIC / D&C     CYSTO WITH HYDRODISTENSION N/A 02/22/2023   Procedure: HYDRODISTENSION;  Surgeon: Despina Arias, MD;  Location: [facility];  Service: Urology;  Laterality: N/A;   CYSTOSCOPY W/ RETROGRADES N/A 02/22/2023   Procedure: CYSTOSCOPY WITH BILATERAL RETROGRADE PYELOGRAM;  Surgeon: Despina Arias, MD;  Location: [facility];  Service: Urology;  Laterality: N/A;  60 MINUTES NEEDED FOR CASE   CYSTOSCOPY WITH URETHRAL DILATATION N/A 02/22/2023   Procedure: URETHRAL DILATATION;  Surgeon: Despina Arias, MD;  Location: [facility];  Service: Urology;  Laterality: N/A;   HAND SURGERY     2012, 2013   LAPAROSCOPIC ENDOMETRIOSIS FULGURATION Right    LESION REMOVAL  08/23/2012   Procedure: LESION REMOVAL HAND;  Surgeon: Wyn Forster., MD;  Location: [facility];  Service: Orthopedics;  Laterality: Left;   ROTATOR CUFF REPAIR     UPPER GASTROINTESTINAL ENDOSCOPY      Allergies:  Allergies  Allergen Reactions   Acetaminophen Rash    Blistering lymph nodes, and blistering rash    Airduo Digihaler [Fluticasone-Salmeterol] Hives, Shortness Of Breath and Itching    Asthma attack   Allegra [Fexofenadine] Cough    She reports tachycardia and elevated BP and low Oxygen level after taking this   Aspirin Shortness Of Breath    High dose brings asthma attack    B-12 [Cyanocobalamin] Shortness Of Breath and Itching    Drop O2   Baclofen Other (See Comments)    Headache, muscle weakness, unable to urinate, unable to walk    Bactrim Anaphylaxis   Bee Venom Hives, Itching, Other (See Comments) and Rash   Benadryl  [Diphenhydramine] Anaphylaxis    Only the liquid form she can not take because it causes throat closing,  DO NOT USE IN IV FORM - tablets are ok   Ciprofloxacin Shortness Of Breath    Blurred Vision, joint pain, headaches, dropped BP   Clindamycin Shortness Of Breath   Coconut (Cocos Nucifera) Hives, Itching and Other (See Comments)    Coconut Oil    Coq-10 [Coenzyme Q10] Hives, Shortness Of Breath and Itching   Cymbalta [Duloxetine Hcl] Anaphylaxis and Shortness Of Breath   Darvon [Propoxyphene] Hives, Shortness Of Breath and Itching    Darvocet*   Flunisolide Shortness Of Breath and Other (See Comments)    Aerospan Nasal Inhaler* - caused blood clots in nose    Formaldehyde Anaphylaxis   Hydrocodone-Acetaminophen Hives, Shortness Of Breath and Itching    Vicodin, Norco   Nucynta [Tapentadol] Shortness  Of Breath    Coughing, SOB    Other Hives, Itching, Rash and Other (See Comments)    Insect stings/ Bites  Wine spirit  Peanuts - Bring on Asthma attack   Cold Temperatures  - causes hives, and muscle spasms    Peanut-Containing Drug Products Hives, Shortness Of Breath, Itching, Swelling and Other (See Comments)   Potassium-Containing Compounds Anaphylaxis, Itching, Swelling and Palpitations    Tables, and Oral Solution    Ramipril Anaphylaxis   Skelaxin [Metaxalone] Hives, Shortness Of Breath and Itching    Brings on Asthma Attacks    Sulfa Antibiotics Anaphylaxis   Sulfamethoxazole-Trimethoprim Anaphylaxis   Tezspire [Tezepelumab] Shortness Of Breath, Nausea And Vomiting, Swelling, Rash and Other (See Comments)    Muscle weakness, joint pain, sore throat   Triamcinolone Other (See Comments)    Kenalog Injection - muscle weakness, spasms, BP drop, lethargic, tongue swelling   Nasacort causes breathing issues    Voltaren [Diclofenac] Shortness Of Breath, Swelling and Rash    Gel*    Zofran [Ondansetron Hcl] Hives and Itching    Blurred Vision    Aspartame Other (See  Comments)    Severe Migraines   Bacitra-Neomycin-Polymyxin-Hc Rash   Cranberry Extract Other (See Comments)    Throat itching / burning    Sulfur Hives and Itching   Aciphex [Rabeprazole]     Muscle weakness, headaches    Addyi [Flibanserin]     Decreased sex drive to 0   Alvesco [Ciclesonide] Other (See Comments)    Sores in mouth, coughing    Ambien [Zolpidem]     Mood Change, Aggressive, Insomnia    Amoxicillin     Pt unsure of Allergy    Ativan [Lorazepam] Other (See Comments)    Hallucination     Aygestin [Norethindrone]     Muscle weakness, abdominal pain   Celebrex [Celecoxib] Other (See Comments)    Possible Reaction - Not taken    Demeclocycline     Tetracycline Family-  GI Upset   Dexilant [Dexlansoprazole]     Severe joint pain    Doxycycline     Possibly joint pain or itch    Effexor Xr [Venlafaxine Hcl Er]     Migraines, weight gain, trouble sleeping   Elavil [Amitriptyline] Nausea And Vomiting    Headache, and muscle weakness   Erythromycin     Upset stomach   Erythromycin Base     Stomach Upset    Fentanyl Dermatitis    Patches*    Fioricet [Butalbital-Apap-Caffeine]     Acetaminophen    Hydrocodone Itching   Hydrocodone Bit-Homatrop Mbr Hives and Itching    Not true allergy. "irritated"   Hydromorphone Nausea Only   Inderal [Propranolol]     Agitation, difficulty sleeping    Indomethacin Hives and Itching   Latex Itching    Skin Peeling    Levofloxacin     Weakness, myalgia   Lyrica [Pregabalin]     Muscle weakness, Causes Falls    Maxalt [Rizatriptan] Other (See Comments)    Pt was knocked out from med, extreme drowsiness and did not help migraine    Meloxicam Itching    Increases BP   Morphine Sulfate Er     Severe insomnia, does not help pain    Naproxen Hives and Itching    Head to Toe    Neurontin [Gabapentin]     Depression    Nexium [Esomeprazole]     Bad headaches    Nortriptyline  Depression, decreased sex drive     Omeprazole-Sodium Bicarbonate     Chest pain-sore all over-HA   Ranitidine     Severe joint pain   Robaxin [Methocarbamol]     Made her very tired and unable to sleep at night Muscle weakness, fatigue    Tape Dermatitis   Tetanus Toxoid Other (See Comments)    Tetanus injection - blistering rash, swelling at injection site, turn into baseball size lump, joint pain    Tetanus-Diphtheria Toxoids Td Hives    Robust local reaction   Tetracyclines & Related     GI Upset    Tizanidine Other (See Comments)    "knocked her out"- had a rebound migraine, causes muscles to lock, and bedridden    Topamax [Topiramate]     Lethargic, daytime drowsiness, insomnia    Trazodone     Pt thinks it did not help - unsure of reaction    Tums [Calcium Carbonate] Other (See Comments)    Blistering Rash with high doses    Wound Dressing Adhesive Itching and Dermatitis    Bandages    Etodolac Rash    Blistering Rash, and swollen lymph nodes    Flexeril [Cyclobenzaprine] Palpitations    Irregular heartbeat    Ibuprofen Itching, Hives, Rash and Swelling    Throat itching   Neosporin [Neomycin-Bacitracin Zn-Polymyx] Rash    Blistering     Family History:  Family History  Problem Relation Age of Onset   Cancer Father        Multiple myeloma   ADD / ADHD Brother    Stomach cancer Maternal Grandfather    Colon cancer Neg Hx    Esophageal cancer Neg Hx    Rectal cancer Neg Hx     Social History:  Social History   Tobacco Use   Smoking status: Never   Smokeless tobacco: Never  Vaping Use   Vaping status: Never Used  Substance Use Topics   Alcohol use: Yes    Comment: occasional   Drug use: No    ROS: Constitutional:  Negative for fever, chills, weight loss CV: Negative for chest pain, previous MI, hypertension Respiratory:  Negative for shortness of breath, wheezing, sleep apnea, frequent cough GI:  Negative for nausea, vomiting, bloody stool, GERD  Physical exam: BP (!) 151/90    Pulse 62   Ht 5\' 3"  (1.6 m)   Wt 162 lb (73.5 kg)   LMP  (LMP Unknown)   BMI 28.70 kg/m  GENERAL APPEARANCE:  Well appearing, well developed, well nourished, NAD HEENT:  Atraumatic, normocephalic, oropharynx clear NECK:  Supple without lymphadenopathy or thyromegaly ABDOMEN:  Soft, non-tender, no masses EXTREMITIES:  Moves all extremities well, without clubbing, cyanosis, or edema NEUROLOGIC:  Alert and oriented x 3, normal gait, CN II-XII grossly intact MENTAL STATUS:  appropriate BACK:  Non-tender to palpation, No CVAT SKIN:  Warm, dry, and intact  Results: U/A: 0 WBC, 0-2 RBC

## 2023-06-12 ENCOUNTER — Other Ambulatory Visit (HOSPITAL_BASED_OUTPATIENT_CLINIC_OR_DEPARTMENT_OTHER): Payer: Self-pay

## 2023-06-12 ENCOUNTER — Other Ambulatory Visit: Payer: Self-pay | Admitting: Family Medicine

## 2023-06-12 ENCOUNTER — Other Ambulatory Visit: Payer: Self-pay

## 2023-06-12 DIAGNOSIS — F908 Attention-deficit hyperactivity disorder, other type: Secondary | ICD-10-CM

## 2023-06-12 DIAGNOSIS — F411 Generalized anxiety disorder: Secondary | ICD-10-CM

## 2023-06-12 LAB — URINALYSIS, ROUTINE W REFLEX MICROSCOPIC
Bilirubin, UA: NEGATIVE
Glucose, UA: NEGATIVE
Ketones, UA: NEGATIVE
Leukocytes,UA: NEGATIVE
Nitrite, UA: NEGATIVE
Protein,UA: NEGATIVE
Specific Gravity, UA: 1.01 (ref 1.005–1.030)
Urobilinogen, Ur: 0.2 mg/dL (ref 0.2–1.0)
pH, UA: 7 (ref 5.0–7.5)

## 2023-06-12 LAB — MICROSCOPIC EXAMINATION
Epithelial Cells (non renal): NONE SEEN /HPF (ref 0–10)
WBC, UA: NONE SEEN /HPF (ref 0–5)

## 2023-06-12 MED ORDER — DIAZEPAM 10 MG PO TABS
10.0000 mg | ORAL_TABLET | Freq: Four times a day (QID) | ORAL | 1 refills | Status: DC | PRN
Start: 2023-06-12 — End: 2023-07-11
  Filled 2023-06-12: qty 30, 8d supply, fill #0
  Filled 2023-06-27: qty 30, 8d supply, fill #1

## 2023-06-12 MED ORDER — METHYLPHENIDATE HCL 10 MG PO TABS
10.0000 mg | ORAL_TABLET | Freq: Three times a day (TID) | ORAL | 0 refills | Status: DC
Start: 2023-06-12 — End: 2023-07-11
  Filled 2023-06-12: qty 90, 30d supply, fill #0

## 2023-06-14 ENCOUNTER — Encounter: Payer: Self-pay | Admitting: Family Medicine

## 2023-06-14 ENCOUNTER — Other Ambulatory Visit (HOSPITAL_BASED_OUTPATIENT_CLINIC_OR_DEPARTMENT_OTHER): Payer: Self-pay

## 2023-06-14 ENCOUNTER — Ambulatory Visit: Payer: No Typology Code available for payment source | Admitting: Family Medicine

## 2023-06-14 VITALS — BP 102/70 | HR 49 | Temp 98.0°F | Ht 63.0 in | Wt 165.2 lb

## 2023-06-14 DIAGNOSIS — U099 Post covid-19 condition, unspecified: Secondary | ICD-10-CM | POA: Diagnosis not present

## 2023-06-14 DIAGNOSIS — I10 Essential (primary) hypertension: Secondary | ICD-10-CM

## 2023-06-14 MED ORDER — LOSARTAN POTASSIUM 25 MG PO TABS
25.0000 mg | ORAL_TABLET | Freq: Every day | ORAL | 11 refills | Status: DC
Start: 2023-06-14 — End: 2023-10-17
  Filled 2023-06-14: qty 30, 30d supply, fill #0
  Filled 2023-07-11: qty 30, 30d supply, fill #1
  Filled 2023-08-08: qty 30, 30d supply, fill #2
  Filled 2023-09-07: qty 30, 30d supply, fill #3

## 2023-06-14 NOTE — Assessment & Plan Note (Signed)
Blood pressure is in good control today, though home BP has occasionally been up to 140-150/80-90s recently. I will try stopping her atenolol and starting her on a low dose of losartan, to see if this will resolve her bradycardia, but also give her more consistent BP control. She ahs a previously scheduled follow-up in 2 weeks. We will see how she is doing then.

## 2023-06-14 NOTE — Progress Notes (Signed)
[facility] [address] [place] [address] Dept: [phone] Dept Fax: [phone]  Office Visit  Subjective:    Patient ID: Samantha [name], female    DOB: September 04, 1971, 52 y.o..   MRN: [id number]  Chief Complaint  Patient presents with   Hypertension    C/o having BP issues since having Covid on 05/19/23.   110/74, 132/81,  147/72,  142/83, 107/68   History of Present Illness:  Patient is in today for complaining of lingering symptoms since having COVID-19 2 months ago. Samantha [name] did experience typical symptoms of COVID in early August. Her husband and son experienced similar issues at that time and her husband also tested positive. Samantha [name] notes since then, she has continued to have some cough, sore throat, nasal congestion, periodic sharp chest pains (which can occur with exertion), fatigue and headache. She notes that she experiences significant fatigue with even light activity. She has also noted significant variability in her blood pressure. She has had an occasion where her blood pressure spikes, such as up to 175/101 while trying to mow her yard. She is managed on atenolol 25 mg 1/2 tab daily for blood pressure control. Previously this had worked well, but she has had issues at times with the secondary bradycardia. She has a past history of intolerance/allergic reactions to many medications. This includes ramipril and propranolol.   Past Medical History: Patient Active Problem List   Diagnosis Date Noted   Gross hematuria 04/27/2023   Renal mass 04/24/2023   Cervical spine arthritis 02/25/2022   Leg swelling 10/19/2021   Interstitial cystitis 09/15/2021   Other urethral stricture, female 09/15/2021   Chronic kidney disease, stage 3a (HCC) 08/20/2021   Prediabetes 08/27/2020   History of [place] spotted fever 02/04/2020   Moderate persistent asthma without complication 08/02/2019   LPRD  (laryngopharyngeal reflux disease) 08/02/2019   Chronic rhinitis 08/02/2019   Chronic post-traumatic stress disorder (PTSD) 11/21/2018   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) 11/21/2018   Pulmonary nodules 11/13/2018   Other microscopic hematuria 11/06/2018   Generalized anxiety disorder 07/04/2018   Fecal incontinence 07/02/2018   Leg weakness 06/12/2017   Left ear hearing loss 02/07/2017   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   Borderline hyperlipidemia 11/08/2016   Acute idiopathic gout of multiple sites 11/08/2016   Elbow pain, chronic, right 10/06/2016   Lateral epicondylitis, right elbow 10/06/2016   Fibromyalgia 10/04/2016   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   Migraine headache 07/29/2016   Irritable bowel syndrome with both constipation and diarrhea 07/29/2016   Hives 07/18/2016   Urinary incontinence 06/02/2016   Atherosclerosis of abdominal aorta (HCC) 09/03/2015   Carpal tunnel syndrome 11/19/2014   Primary hypersomnia 11/18/2014   Insomnia 11/18/2014   Lumbar radicular pain 11/18/2014   Chronic pain syndrome 11/18/2014   Cervical disc disorder with radiculopathy 11/18/2014   Chronic female pelvic pain 07/31/2014   Degeneration of intervertebral disc of lumbosacral region 07/31/2014   Rosacea 07/31/2014   Morton's neuroma 07/31/2014   Benign paroxysmal positional vertigo 07/31/2014   Essential hypertension 07/31/2014   History of methicillin resistant Staphylococcus aureus infection 07/09/2014   Past Surgical History:  Procedure Laterality Date   BLADDER SURGERY     age 46- 53   CERVICAL FUSION     08/1998, 03/10/2017   CESAREAN SECTION     COLONOSCOPY     COMBINED HYSTEROSCOPY DIAGNOSTIC / D&C     CYSTO WITH HYDRODISTENSION  N/A 02/22/2023   Procedure: HYDRODISTENSION;  Surgeon: Despina Arias, MD;  Location: [facility];  Service: Urology;  Laterality: N/A;   CYSTOSCOPY W/ RETROGRADES N/A 02/22/2023    Procedure: CYSTOSCOPY WITH BILATERAL RETROGRADE PYELOGRAM;  Surgeon: Despina Arias, MD;  Location: [facility];  Service: Urology;  Laterality: N/A;  60 MINUTES NEEDED FOR CASE   CYSTOSCOPY WITH URETHRAL DILATATION N/A 02/22/2023   Procedure: URETHRAL DILATATION;  Surgeon: Despina Arias, MD;  Location: [facility];  Service: Urology;  Laterality: N/A;   HAND SURGERY     2012, 2013   LAPAROSCOPIC ENDOMETRIOSIS FULGURATION Right    LESION REMOVAL  08/23/2012   Procedure: LESION REMOVAL HAND;  Surgeon: Wyn Forster., MD;  Location: [facility];  Service: Orthopedics;  Laterality: Left;   ROTATOR CUFF REPAIR     UPPER GASTROINTESTINAL ENDOSCOPY     Family History  Problem Relation Age of Onset   Cancer Father        Multiple myeloma   ADD / ADHD Brother    Stomach cancer Maternal Grandfather    Colon cancer Neg Hx    Esophageal cancer Neg Hx    Rectal cancer Neg Hx    Outpatient Medications Prior to Visit  Medication Sig Dispense Refill   albuterol (VENTOLIN HFA) 108 (90 Base) MCG/ACT inhaler INHALE 2 PUFFS BY MOUTH EVERY 4 HOURS AS NEEDED FOR WHEEZING FOR SHORTNESS OF BREATH 18 g 0   ALPRAZolam (XANAX) 1 MG tablet Take 1 tablet (1 mg total) by mouth 2 (two) times daily as needed. 20 tablet 2   Alum Hydroxide-Mag Trisilicate (GAVISCON) 80-14.2 MG CHEW Chew 2 tablets by mouth as needed (acid reflux).     aspirin EC 81 MG tablet Take 81 mg by mouth daily. Swallow whole.     AUVI-Q 0.3 MG/0.3ML SOAJ injection Inject 0.3 mg into the muscle as needed for anaphylaxis. As directed for life-threatening allergic reactions 2 each 1   azelastine (ASTELIN) 0.1 % nasal spray Place 2 sprays into each nostril 1-2 times per day. 30 mL 5   benzonatate (TESSALON) 100 MG capsule Take 1 capsule (100 mg total) by mouth 3 (three) times daily as needed for cough. 90 capsule 1   budesonide (PULMICORT) 0.25 MG/2ML nebulizer solution Take 0.25 mg by nebulization 2 (two) times daily as needed (Asthma).      Cholecalciferol (VITAMIN D) 50 MCG (2000 UT) CAPS Take 2,000 Units by mouth daily.     cimetidine (TAGAMET) 200 MG tablet Take 200 mg by mouth 2 (two) times daily.     diazepam (VALIUM) 10 MG tablet Take 1 tablet (10 mg total) by mouth every 6 (six) hours as needed for anxiety or sleep (vertigo/muscle spasms). 30 tablet 1   diphenhydrAMINE (BENADRYL) 12.5 MG chewable tablet Chew 25 mg by mouth 4 (four) times daily as needed for allergies.     EPINEPHrine (EPIPEN 2-PAK) 0.3 mg/0.3 mL IJ SOAJ injection INJECT CONTENTS OF 1 PEN AS NEEDED FOR ALLERGIC REACTION 2 each 2   EPINEPHrine (PRIMATENE MIST) 0.125 MG/ACT AERO Inhale 1 puff into the lungs as needed (Asthma).     estradiol (ESTRACE) 0.1 MG/GM vaginal cream Place 1 Applicatorful vaginally once a week.     famotidine (PEPCID AC MAXIMUM STRENGTH) 20 MG tablet Take 20 mg by mouth daily as needed (Hives/itching).     guaifenesin (HUMIBID E) 400 MG TABS tablet Take 400-800 mg by mouth every 4 (four) hours as needed (Cough/mucus).  ipratropium (ATROVENT) 0.06 % nasal spray Place 2 sprays into each nostril 1-2 times per day. 15 mL 5   levalbuterol (XOPENEX HFA) 45 MCG/ACT inhaler INHALE 2 PUFFS BY MOUTH EVERY 4 HOURS AS NEEDED FOR WHEEZING 15 g 0   magnesium oxide (MAG-OX) 400 (240 Mg) MG tablet Take 400 mg by mouth daily.     methylphenidate (RITALIN) 10 MG tablet Take 1 tablet (10 mg total) by mouth 3 (three) times daily with meals. 90 tablet 0   Olopatadine HCl (PATADAY) 0.2 % SOLN Place 1 drop into both eyes 2 (two) times daily as needed (eye allergies).     rosuvastatin (CRESTOR) 10 MG tablet Take 1 tablet by mouth once daily 90 tablet 3   SUMAtriptan (IMITREX) 50 MG tablet Take 1 tablet by mouth once as needed. May repeat in 2 hours if headache persists or recurs. 10 tablet 3   atenolol (TENORMIN) 25 MG tablet TAKE 1 TABLET BY MOUTH ONCE DAILY AS NEEDED (Patient taking differently: Take 12.5 mg by mouth daily.) 90 tablet 3   No  facility-administered medications prior to visit.   Allergies  Allergen Reactions   Acetaminophen Rash    Blistering lymph nodes, and blistering rash    Airduo Digihaler [Fluticasone-Salmeterol] Hives, Shortness Of Breath and Itching    Asthma attack   Allegra [Fexofenadine] Cough    She reports tachycardia and elevated BP and low Oxygen level after taking this   Aspirin Shortness Of Breath    High dose brings asthma attack    B-12 [Cyanocobalamin] Shortness Of Breath and Itching    Drop O2   Baclofen Other (See Comments)    Headache, muscle weakness, unable to urinate, unable to walk    Bactrim Anaphylaxis   Bee Venom Hives, Itching, Other (See Comments) and Rash   Benadryl [Diphenhydramine] Anaphylaxis    Only the liquid form she can not take because it causes throat closing,  DO NOT USE IN IV FORM - tablets are ok   Ciprofloxacin Shortness Of Breath    Blurred Vision, joint pain, headaches, dropped BP   Clindamycin Shortness Of Breath   Coconut (Cocos Nucifera) Hives, Itching and Other (See Comments)    Coconut Oil    Coq-10 [Coenzyme Q10] Hives, Shortness Of Breath and Itching   Cymbalta [Duloxetine Hcl] Anaphylaxis and Shortness Of Breath   Darvon [Propoxyphene] Hives, Shortness Of Breath and Itching    Darvocet*   Flunisolide Shortness Of Breath and Other (See Comments)    Aerospan Nasal Inhaler* - caused blood clots in nose    Formaldehyde Anaphylaxis   Hydrocodone-Acetaminophen Hives, Shortness Of Breath and Itching    Vicodin, Norco   Nucynta [Tapentadol] Shortness Of Breath    Coughing, SOB    Other Hives, Itching, Rash and Other (See Comments)    Insect stings/ Bites  Wine spirit  Peanuts - Bring on Asthma attack   Cold Temperatures  - causes hives, and muscle spasms    Peanut-Containing Drug Products Hives, Shortness Of Breath, Itching, Swelling and Other (See Comments)   Potassium-Containing Compounds Anaphylaxis, Itching, Swelling and Palpitations     Tables, and Oral Solution    Ramipril Anaphylaxis   Skelaxin [Metaxalone] Hives, Shortness Of Breath and Itching    Brings on Asthma Attacks    Sulfa Antibiotics Anaphylaxis   Sulfamethoxazole-Trimethoprim Anaphylaxis   Tezspire [Tezepelumab] Shortness Of Breath, Nausea And Vomiting, Swelling, Rash and Other (See Comments)    Muscle weakness, joint pain, sore throat   Triamcinolone  Other (See Comments)    Kenalog Injection - muscle weakness, spasms, BP drop, lethargic, tongue swelling   Nasacort causes breathing issues    Voltaren [Diclofenac] Shortness Of Breath, Swelling and Rash    Gel*    Zofran [Ondansetron Hcl] Hives and Itching    Blurred Vision    Aspartame Other (See Comments)    Severe Migraines   Bacitra-Neomycin-Polymyxin-Hc Rash   Cranberry Extract Other (See Comments)    Throat itching / burning    Sulfur Hives and Itching   Aciphex [Rabeprazole]     Muscle weakness, headaches    Addyi [Flibanserin]     Decreased sex drive to 0   Alvesco [Ciclesonide] Other (See Comments)    Sores in mouth, coughing    Ambien [Zolpidem]     Mood Change, Aggressive, Insomnia    Amoxicillin     Pt unsure of Allergy    Ativan [Lorazepam] Other (See Comments)    Hallucination     Aygestin [Norethindrone]     Muscle weakness, abdominal pain   Celebrex [Celecoxib] Other (See Comments)    Possible Reaction - Not taken    Demeclocycline     Tetracycline Family-  GI Upset   Dexilant [Dexlansoprazole]     Severe joint pain    Doxycycline     Possibly joint pain or itch    Effexor Xr [Venlafaxine Hcl Er]     Migraines, weight gain, trouble sleeping   Elavil [Amitriptyline] Nausea And Vomiting    Headache, and muscle weakness   Erythromycin     Upset stomach   Erythromycin Base     Stomach Upset    Fentanyl Dermatitis    Patches*    Fioricet [Butalbital-Apap-Caffeine]     Acetaminophen    Hydrocodone Itching   Hydrocodone Bit-Homatrop Mbr Hives and Itching    Not true  allergy. "irritated"   Hydromorphone Nausea Only   Inderal [Propranolol]     Agitation, difficulty sleeping    Indomethacin Hives and Itching   Latex Itching    Skin Peeling    Levofloxacin     Weakness, myalgia   Lyrica [Pregabalin]     Muscle weakness, Causes Falls    Maxalt [Rizatriptan] Other (See Comments)    Pt was knocked out from med, extreme drowsiness and did not help migraine    Meloxicam Itching    Increases BP   Morphine Sulfate Er     Severe insomnia, does not help pain    Naproxen Hives and Itching    Head to Toe    Neurontin [Gabapentin]     Depression    Nexium [Esomeprazole]     Bad headaches    Nortriptyline     Depression, decreased sex drive    Omeprazole-Sodium Bicarbonate     Chest pain-sore all over-HA   Ranitidine     Severe joint pain   Robaxin [Methocarbamol]     Made her very tired and unable to sleep at night Muscle weakness, fatigue    Tape Dermatitis   Tetanus Toxoid Other (See Comments)    Tetanus injection - blistering rash, swelling at injection site, turn into baseball size lump, joint pain    Tetanus-Diphtheria Toxoids Td Hives    Robust local reaction   Tetracyclines & Related     GI Upset    Tizanidine Other (See Comments)    "knocked her out"- had a rebound migraine, causes muscles to lock, and bedridden    Topamax [Topiramate]  Lethargic, daytime drowsiness, insomnia    Trazodone     Pt thinks it did not help - unsure of reaction    Tums [Calcium Carbonate] Other (See Comments)    Blistering Rash with high doses    Wound Dressing Adhesive Itching and Dermatitis    Bandages    Etodolac Rash    Blistering Rash, and swollen lymph nodes    Flexeril [Cyclobenzaprine] Palpitations    Irregular heartbeat    Ibuprofen Itching, Hives, Rash and Swelling    Throat itching   Neosporin [Neomycin-Bacitracin Zn-Polymyx] Rash    Blistering      Objective:   Today's Vitals   06/14/23 1000  BP: 102/70  Pulse: (!) 49  Temp: 98 F  (36.7 C)  TempSrc: Temporal  SpO2: 96%  Weight: 165 lb 3.2 oz (74.9 kg)  Height: 5\' 3"  (1.6 m)   Body mass index is 29.26 kg/m.   General: Well developed, well nourished. No acute distress. HEENT: Normocephalic, non-traumatic. Conjunctiva clear. External ears normal. EAC and TMs normal   bilaterally. Nose clear without congestion or rhinorrhea. Mucous membranes moist. Oropharynx clear. Good   dentition. Neck: Supple. No lymphadenopathy. No thyromegaly. Lungs: Clear to auscultation bilaterally. No wheezing, rales or rhonchi. CV: Bradycardic. RRR without murmurs or rubs. Pulses 2+ bilaterally. Psych: Alert and oriented. Normal mood and affect.  Health Maintenance Due  Topic Date Due   HIV Screening  Never done   Zoster Vaccines- Shingrix (1 of 2) Never done     Assessment & Plan:   Problem List Items Addressed This Visit       Cardiovascular and Mediastinum   Essential hypertension    Blood pressure is in good control today, though home BP has occasionally been up to 140-150/80-90s recently. I will try stopping her atenolol and starting her on a low dose of losartan, to see if this will resolve her bradycardia, but also give her more consistent BP control. She ahs a previously scheduled follow-up in 2 weeks. We will see how she is doing then.      Relevant Medications   losartan (COZAAR) 25 MG tablet   Other Visit Diagnoses     COVID-19 long hauler    -  Primary   Discussed that this will be expected to resolve with time. She shodul manage expectations for how much she can tolerate each day. We will manage expectantly.       Return for Follow-up as scheduled.   Loyola Mast, MD

## 2023-06-26 ENCOUNTER — Encounter: Payer: Self-pay | Admitting: Family Medicine

## 2023-06-26 ENCOUNTER — Ambulatory Visit: Payer: No Typology Code available for payment source | Admitting: Family Medicine

## 2023-06-26 VITALS — BP 134/72 | HR 55 | Temp 97.6°F | Ht 63.0 in | Wt 167.2 lb

## 2023-06-26 DIAGNOSIS — U099 Post covid-19 condition, unspecified: Secondary | ICD-10-CM

## 2023-06-26 DIAGNOSIS — I10 Essential (primary) hypertension: Secondary | ICD-10-CM

## 2023-06-26 DIAGNOSIS — G43909 Migraine, unspecified, not intractable, without status migrainosus: Secondary | ICD-10-CM

## 2023-06-26 NOTE — Assessment & Plan Note (Signed)
Since having COVID, Ms. Samantha [name] has had an increase in frequency of headaches. I discussed concern about medication overuse headache in light of taking her triptan so frequently. I will refer her to neurology for an assessment and to discuss other potential options for managing her symptoms.

## 2023-06-26 NOTE — Progress Notes (Deleted)
[facility]IMARY [facility]IMARY Trecia Rogers [facility] [address] [place] [address] Dept: [phone] Dept Fax: [phone]  Chronic Care Office Visit  Subjective:    Patient ID: Samantha [name], female    DOB: [birth year], 52 y.o..   MRN: [id number]  Chief Complaint  Patient presents with   Hypertension    2 month f/u.   BP at home 122/71, 129/72, 110/70,  143/86, 149/86.  Taking Losartan at night due to side effects.     History of Present Illness:  Patient is in today for reassessment of chronic medical issues.  Past Medical History: Patient Active Problem List   Diagnosis Date Noted   Gross hematuria 04/27/2023   Renal mass 04/24/2023   Cervical spine arthritis 02/25/2022   Leg swelling 10/19/2021   Interstitial cystitis 09/15/2021   Other urethral stricture, female 09/15/2021   Chronic kidney disease, stage 3a (HCC) 08/20/2021   Prediabetes 08/27/2020   History of [place] spotted fever 02/04/2020   Moderate persistent asthma without complication 08/02/2019   LPRD (laryngopharyngeal reflux disease) 08/02/2019   Chronic rhinitis 08/02/2019   Chronic post-traumatic stress disorder (PTSD) 11/21/2018   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) 11/21/2018   Pulmonary nodules 11/13/2018   Other microscopic hematuria 11/06/2018   Generalized anxiety disorder 07/04/2018   Fecal incontinence 07/02/2018   Leg weakness 06/12/2017   Left ear hearing loss 02/07/2017   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   Borderline hyperlipidemia 11/08/2016   Acute idiopathic gout of multiple sites 11/08/2016   Elbow pain, chronic, right 10/06/2016   Lateral epicondylitis, right elbow 10/06/2016   Fibromyalgia 10/04/2016   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   Migraine headache 07/29/2016   Irritable bowel syndrome with both constipation and diarrhea 07/29/2016   Hives 07/18/2016   Urinary incontinence  06/02/2016   Atherosclerosis of abdominal aorta (HCC) 09/03/2015   Carpal tunnel syndrome 11/19/2014   Primary hypersomnia 11/18/2014   Insomnia 11/18/2014   Lumbar radicular pain 11/18/2014   Chronic pain syndrome 11/18/2014   Cervical disc disorder with radiculopathy 11/18/2014   Chronic female pelvic pain 07/31/2014   Degeneration of intervertebral disc of lumbosacral region 07/31/2014   Rosacea 07/31/2014   Morton's neuroma 07/31/2014   Benign paroxysmal positional vertigo 07/31/2014   Essential hypertension 07/31/2014   History of methicillin resistant Staphylococcus aureus infection 07/09/2014   Past Surgical History:  Procedure Laterality Date   BLADDER SURGERY     age 53- 63   CERVICAL FUSION     08/1998, 03/10/2017   CESAREAN SECTION     COLONOSCOPY     COMBINED HYSTEROSCOPY DIAGNOSTIC / D&C     CYSTO WITH HYDRODISTENSION N/A 02/22/2023   Procedure: HYDRODISTENSION;  Surgeon: Despina Arias, MD;  Location: [facility];  Service: Urology;  Laterality: N/A;   CYSTOSCOPY W/ RETROGRADES N/A 02/22/2023   Procedure: CYSTOSCOPY WITH BILATERAL RETROGRADE PYELOGRAM;  Surgeon: Despina Arias, MD;  Location: [facility];  Service: Urology;  Laterality: N/A;  60 MINUTES NEEDED FOR CASE   CYSTOSCOPY WITH URETHRAL DILATATION N/A 02/22/2023   Procedure: URETHRAL DILATATION;  Surgeon: Despina Arias, MD;  Location: [facility];  Service: Urology;  Laterality: N/A;   HAND SURGERY     2012, 2013   LAPAROSCOPIC ENDOMETRIOSIS FULGURATION Right    LESION REMOVAL  08/23/2012   Procedure: LESION REMOVAL HAND;  Surgeon: Wyn Forster., MD;  Location: [facility];  Service: Orthopedics;  Laterality: Left;   ROTATOR CUFF  REPAIR     UPPER GASTROINTESTINAL ENDOSCOPY     Family History  Problem Relation Age of Onset   Cancer Father        Multiple myeloma   ADD / ADHD Brother    Stomach cancer Maternal Grandfather    Colon cancer Neg Hx    Esophageal cancer Neg Hx    Rectal cancer Neg  Hx    Outpatient Medications Prior to Visit  Medication Sig Dispense Refill   albuterol (VENTOLIN HFA) 108 (90 Base) MCG/ACT inhaler INHALE 2 PUFFS BY MOUTH EVERY 4 HOURS AS NEEDED FOR WHEEZING FOR SHORTNESS OF BREATH 18 g 0   ALPRAZolam (XANAX) 1 MG tablet Take 1 tablet (1 mg total) by mouth 2 (two) times daily as needed. 20 tablet 2   Alum Hydroxide-Mag Trisilicate (GAVISCON) 80-14.2 MG CHEW Chew 2 tablets by mouth as needed (acid reflux).     atenolol (TENORMIN) 25 MG tablet Take 12.5 mg by mouth daily.     AUVI-Q 0.3 MG/0.3ML SOAJ injection Inject 0.3 mg into the muscle as needed for anaphylaxis. As directed for life-threatening allergic reactions 2 each 1   azelastine (ASTELIN) 0.1 % nasal spray Place 2 sprays into each nostril 1-2 times per day. 30 mL 5   benzonatate (TESSALON) 100 MG capsule Take 1 capsule (100 mg total) by mouth 3 (three) times daily as needed for cough. 90 capsule 1   budesonide (PULMICORT) 0.25 MG/2ML nebulizer solution Take 0.25 mg by nebulization 2 (two) times daily as needed (Asthma).     Cholecalciferol (VITAMIN D) 50 MCG (2000 UT) CAPS Take 2,000 Units by mouth daily.     cimetidine (TAGAMET) 200 MG tablet Take 200 mg by mouth 2 (two) times daily.     diazepam (VALIUM) 10 MG tablet Take 1 tablet (10 mg total) by mouth every 6 (six) hours as needed for anxiety or sleep (vertigo/muscle spasms). 30 tablet 1   diphenhydrAMINE (BENADRYL) 12.5 MG chewable tablet Chew 25 mg by mouth 4 (four) times daily as needed for allergies.     EPINEPHrine (EPIPEN 2-PAK) 0.3 mg/0.3 mL IJ SOAJ injection INJECT CONTENTS OF 1 PEN AS NEEDED FOR ALLERGIC REACTION 2 each 2   EPINEPHrine (PRIMATENE MIST) 0.125 MG/ACT AERO Inhale 1 puff into the lungs as needed (Asthma).     estradiol (ESTRACE) 0.1 MG/GM vaginal cream Place 1 Applicatorful vaginally once a week.     famotidine (PEPCID AC MAXIMUM STRENGTH) 20 MG tablet Take 20 mg by mouth daily as needed (Hives/itching).     guaifenesin  (HUMIBID E) 400 MG TABS tablet Take 400-800 mg by mouth every 4 (four) hours as needed (Cough/mucus).     ipratropium (ATROVENT) 0.06 % nasal spray Place 2 sprays into each nostril 1-2 times per day. 15 mL 5   levalbuterol (XOPENEX HFA) 45 MCG/ACT inhaler INHALE 2 PUFFS BY MOUTH EVERY 4 HOURS AS NEEDED FOR WHEEZING 15 g 0   losartan (COZAAR) 25 MG tablet Take 1 tablet (25 mg total) by mouth daily. 30 tablet 11   magnesium oxide (MAG-OX) 400 (240 Mg) MG tablet Take 400 mg by mouth daily.     methylphenidate (RITALIN) 10 MG tablet Take 1 tablet (10 mg total) by mouth 3 (three) times daily with meals. 90 tablet 0   Olopatadine HCl (PATADAY) 0.2 % SOLN Place 1 drop into both eyes 2 (two) times daily as needed (eye allergies).     rosuvastatin (CRESTOR) 10 MG tablet Take 1 tablet by mouth once  daily 90 tablet 3   SUMAtriptan (IMITREX) 50 MG tablet Take 1 tablet by mouth once as needed. May repeat in 2 hours if headache persists or recurs. 10 tablet 3   aspirin EC 81 MG tablet Take 81 mg by mouth daily. Swallow whole.     No facility-administered medications prior to visit.   Allergies  Allergen Reactions   Acetaminophen Rash    Blistering lymph nodes, and blistering rash    Airduo Digihaler [Fluticasone-Salmeterol] Hives, Shortness Of Breath and Itching    Asthma attack   Allegra [Fexofenadine] Cough    She reports tachycardia and elevated BP and low Oxygen level after taking this   Aspirin Shortness Of Breath    High dose brings asthma attack    B-12 [Cyanocobalamin] Shortness Of Breath and Itching    Drop O2   Baclofen Other (See Comments)    Headache, muscle weakness, unable to urinate, unable to walk    Bactrim Anaphylaxis   Bee Venom Hives, Itching, Other (See Comments) and Rash   Benadryl [Diphenhydramine] Anaphylaxis    Only the liquid form she can not take because it causes throat closing,  DO NOT USE IN IV FORM - tablets are ok   Ciprofloxacin Shortness Of Breath    Blurred  Vision, joint pain, headaches, dropped BP   Clindamycin Shortness Of Breath   Coconut (Cocos Nucifera) Hives, Itching and Other (See Comments)    Coconut Oil    Coq-10 [Coenzyme Q10] Hives, Shortness Of Breath and Itching   Cymbalta [Duloxetine Hcl] Anaphylaxis and Shortness Of Breath   Darvon [Propoxyphene] Hives, Shortness Of Breath and Itching    Darvocet*   Flunisolide Shortness Of Breath and Other (See Comments)    Aerospan Nasal Inhaler* - caused blood clots in nose    Formaldehyde Anaphylaxis   Hydrocodone-Acetaminophen Hives, Shortness Of Breath and Itching    Vicodin, Norco   Nucynta [Tapentadol] Shortness Of Breath    Coughing, SOB    Other Hives, Itching, Rash and Other (See Comments)    Insect stings/ Bites  Wine spirit  Peanuts - Bring on Asthma attack   Cold Temperatures  - causes hives, and muscle spasms    Peanut-Containing Drug Products Hives, Shortness Of Breath, Itching, Swelling and Other (See Comments)   Potassium-Containing Compounds Anaphylaxis, Itching, Swelling and Palpitations    Tables, and Oral Solution    Ramipril Anaphylaxis   Skelaxin [Metaxalone] Hives, Shortness Of Breath and Itching    Brings on Asthma Attacks    Sulfa Antibiotics Anaphylaxis   Sulfamethoxazole-Trimethoprim Anaphylaxis   Tezspire [Tezepelumab] Shortness Of Breath, Nausea And Vomiting, Swelling, Rash and Other (See Comments)    Muscle weakness, joint pain, sore throat   Triamcinolone Other (See Comments)    Kenalog Injection - muscle weakness, spasms, BP drop, lethargic, tongue swelling   Nasacort causes breathing issues    Voltaren [Diclofenac] Shortness Of Breath, Swelling and Rash    Gel*    Zofran [Ondansetron Hcl] Hives and Itching    Blurred Vision    Aspartame Other (See Comments)    Severe Migraines   Bacitra-Neomycin-Polymyxin-Hc Rash   Cranberry Extract Other (See Comments)    Throat itching / burning    Sulfur Hives and Itching   Aciphex [Rabeprazole]      Muscle weakness, headaches    Addyi [Flibanserin]     Decreased sex drive to 0   Alvesco [Ciclesonide] Other (See Comments)    Sores in mouth, coughing    Ambien [  Zolpidem]     Mood Change, Aggressive, Insomnia    Amoxicillin     Pt unsure of Allergy    Ativan [Lorazepam] Other (See Comments)    Hallucination     Aygestin [Norethindrone]     Muscle weakness, abdominal pain   Celebrex [Celecoxib] Other (See Comments)    Possible Reaction - Not taken    Demeclocycline     Tetracycline Family-  GI Upset   Dexilant [Dexlansoprazole]     Severe joint pain    Doxycycline     Possibly joint pain or itch    Effexor Xr [Venlafaxine Hcl Er]     Migraines, weight gain, trouble sleeping   Elavil [Amitriptyline] Nausea And Vomiting    Headache, and muscle weakness   Erythromycin     Upset stomach   Erythromycin Base     Stomach Upset    Fentanyl Dermatitis    Patches*    Fioricet [Butalbital-Apap-Caffeine]     Acetaminophen    Hydrocodone Itching   Hydrocodone Bit-Homatrop Mbr Hives and Itching    Not true allergy. "irritated"   Hydromorphone Nausea Only   Inderal [Propranolol]     Agitation, difficulty sleeping    Indomethacin Hives and Itching   Latex Itching    Skin Peeling    Levofloxacin     Weakness, myalgia   Lyrica [Pregabalin]     Muscle weakness, Causes Falls    Maxalt [Rizatriptan] Other (See Comments)    Pt was knocked out from med, extreme drowsiness and did not help migraine    Meloxicam Itching    Increases BP   Morphine Sulfate Er     Severe insomnia, does not help pain    Naproxen Hives and Itching    Head to Toe    Neurontin [Gabapentin]     Depression    Nexium [Esomeprazole]     Bad headaches    Nortriptyline     Depression, decreased sex drive    Omeprazole-Sodium Bicarbonate     Chest pain-sore all over-HA   Ranitidine     Severe joint pain   Robaxin [Methocarbamol]     Made her very tired and unable to sleep at night Muscle weakness,  fatigue    Tape Dermatitis   Tetanus Toxoid Other (See Comments)    Tetanus injection - blistering rash, swelling at injection site, turn into baseball size lump, joint pain    Tetanus-Diphtheria Toxoids Td Hives    Robust local reaction   Tetracyclines & Related     GI Upset    Tizanidine Other (See Comments)    "knocked her out"- had a rebound migraine, causes muscles to lock, and bedridden    Topamax [Topiramate]     Lethargic, daytime drowsiness, insomnia    Trazodone     Pt thinks it did not help - unsure of reaction    Tums [Calcium Carbonate] Other (See Comments)    Blistering Rash with high doses    Wound Dressing Adhesive Itching and Dermatitis    Bandages    Etodolac Rash    Blistering Rash, and swollen lymph nodes    Flexeril [Cyclobenzaprine] Palpitations    Irregular heartbeat    Ibuprofen Itching, Hives, Rash and Swelling    Throat itching   Neosporin [Neomycin-Bacitracin Zn-Polymyx] Rash    Blistering    Objective:   Today's Vitals   06/26/23 0949 06/26/23 1002  BP: (!) 144/84 134/72  Pulse: (!) 55   Temp: 97.6 F (36.4 C)  TempSrc: Temporal   SpO2: 99%   Weight: 167 lb 3.2 oz (75.8 kg)   Height: 5\' 3"  (1.6 m)    Body mass index is 29.62 kg/m.   General: Well developed, well nourished. No acute distress. HEENT: Normocephalic, non-traumatic. External ears normal. EAC and TMs normal bilaterally. PERRL, EOMI. Conjunctiva clear. Nose   clear without congestion or rhinorrhea. Mucous membranes moist. Oropharynx clear. Good dentition. Neck: Supple. No lymphadenopathy. No thyromegaly. Lungs: Clear to auscultation bilaterally. No wheezing, rales or rhonchi. CV: RRR without murmurs or rubs. Pulses 2+ bilaterally. Abdomen: Soft, non-tender. Bowel sounds positive, normal pitch and frequency. No hepatosplenomegaly. No rebound or guarding. Back: Straight. No CVA tenderness bilaterally. Extremities: Full ROM. No joint swelling or tenderness. No edema noted. Skin:  Warm and dry. No rashes. Neuro: CN II-XII intact. Normal sensation and DTR bilaterally. Psych: Alert and oriented. Normal mood and affect.  Health Maintenance Due  Topic Date Due   HIV Screening  Never done   Zoster Vaccines- Shingrix (1 of 2) Never done    Lab Results {Labs (Optional):29002}    Assessment & Plan:   Problem List Items Addressed This Visit       Cardiovascular and Mediastinum   Essential hypertension - Primary   Relevant Medications   atenolol (TENORMIN) 25 MG tablet   Migraine headache   Relevant Medications   atenolol (TENORMIN) 25 MG tablet   Other Relevant Orders   Ambulatory referral to Neurology   Other Visit Diagnoses     COVID-19 long hauler       Relevant Orders   Ambulatory referral to Neurology       Return in about 4 weeks (around 07/24/2023) for Reassessment.   Loyola Mast, MD

## 2023-06-26 NOTE — Assessment & Plan Note (Signed)
Blood pressure is elevated today. I reviewed her home BPs. This was improved since Friday, when she added atenolol back to her regimen. I sill have her continue losartan 25 mg daily and atenolol 25 mg 1/2 tab daily. I recommend we monitor this over the next month.

## 2023-06-26 NOTE — Progress Notes (Signed)
[facility] [address] [place] [address] Dept: [phone] Dept Fax: [phone]  Office Visit  Subjective:    Patient ID: Samantha [name], female    DOB: 08/27/1971, 52 y.o..   MRN: [id number]  Chief Complaint  Patient presents with   Hypertension    2 month f/u.   BP at home 122/71, 129/72, 110/70,  143/86, 149/86.  Taking Losartan at night due to side effects.     History of Present Illness:  Patient is in today having noted some lability in her blood pressures. Samantha [name] had COVID in early August. Since that time, she has noted her BP to be spiking quite high at times. She has a history of hypertension and had been managed on atenolol 25 mg 1/2 tab daily. At her visit 2 weeks ago, I tried switching her from atenolol to losartan. After that change, she noted a number of physical complaints, including headache, cough, increased asthma, nausea, decreased libido, nasal congestion, lightheadedness, vertigo, tachycardia, low energy, lack of appetite, and joint and body spasms. She also did not note any improvement in her blood pressure. She has continued use of the  losartan, but has added her atenolol back since Friday.  Samantha [name] has a history of migraine headaches. She uses sumatriptan for acute migraine treatment, Since having COVID, she has noted an increase in her headaches. She states she has been taking her sumatriptan 1-2 times a day. She asks about getting additional medicine, as she is running out of her current prescription.  Past Medical History: Patient Active Problem List   Diagnosis Date Noted   Gross hematuria 04/27/2023   Renal mass 04/24/2023   Cervical spine arthritis 02/25/2022   Leg swelling 10/19/2021   Interstitial cystitis 09/15/2021   Other urethral stricture, female 09/15/2021   Chronic kidney disease, stage 3a (HCC) 08/20/2021   Prediabetes 08/27/2020   History of [place]  spotted fever 02/04/2020   Moderate persistent asthma without complication 08/02/2019   LPRD (laryngopharyngeal reflux disease) 08/02/2019   Chronic rhinitis 08/02/2019   Chronic post-traumatic stress disorder (PTSD) 11/21/2018   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) 11/21/2018   Pulmonary nodules 11/13/2018   Other microscopic hematuria 11/06/2018   Generalized anxiety disorder 07/04/2018   Fecal incontinence 07/02/2018   Leg weakness 06/12/2017   Left ear hearing loss 02/07/2017   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   Borderline hyperlipidemia 11/08/2016   Acute idiopathic gout of multiple sites 11/08/2016   Elbow pain, chronic, right 10/06/2016   Lateral epicondylitis, right elbow 10/06/2016   Fibromyalgia 10/04/2016   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   Migraine headache 07/29/2016   Irritable bowel syndrome with both constipation and diarrhea 07/29/2016   Hives 07/18/2016   Urinary incontinence 06/02/2016   Atherosclerosis of abdominal aorta (HCC) 09/03/2015   Carpal tunnel syndrome 11/19/2014   Primary hypersomnia 11/18/2014   Insomnia 11/18/2014   Lumbar radicular pain 11/18/2014   Chronic pain syndrome 11/18/2014   Cervical disc disorder with radiculopathy 11/18/2014   Chronic female pelvic pain 07/31/2014   Degeneration of intervertebral disc of lumbosacral region 07/31/2014   Rosacea 07/31/2014   Morton's neuroma 07/31/2014   Benign paroxysmal positional vertigo 07/31/2014   Essential hypertension 07/31/2014   History of methicillin resistant Staphylococcus aureus infection 07/09/2014   Past Surgical History:  Procedure Laterality Date   BLADDER SURGERY     age 36- 81   CERVICAL FUSION  08/1998, 03/10/2017   CESAREAN SECTION     COLONOSCOPY     COMBINED HYSTEROSCOPY DIAGNOSTIC / D&C     CYSTO WITH HYDRODISTENSION N/A 02/22/2023   Procedure: HYDRODISTENSION;  Surgeon: Despina Arias, MD;  Location:  [facility];  Service: Urology;  Laterality: N/A;   CYSTOSCOPY W/ RETROGRADES N/A 02/22/2023   Procedure: CYSTOSCOPY WITH BILATERAL RETROGRADE PYELOGRAM;  Surgeon: Despina Arias, MD;  Location: [facility];  Service: Urology;  Laterality: N/A;  60 MINUTES NEEDED FOR CASE   CYSTOSCOPY WITH URETHRAL DILATATION N/A 02/22/2023   Procedure: URETHRAL DILATATION;  Surgeon: Despina Arias, MD;  Location: [facility];  Service: Urology;  Laterality: N/A;   HAND SURGERY     2012, 2013   LAPAROSCOPIC ENDOMETRIOSIS FULGURATION Right    LESION REMOVAL  08/23/2012   Procedure: LESION REMOVAL HAND;  Surgeon: Wyn Forster., MD;  Location: [facility];  Service: Orthopedics;  Laterality: Left;   ROTATOR CUFF REPAIR     UPPER GASTROINTESTINAL ENDOSCOPY     Family History  Problem Relation Age of Onset   Cancer Father        Multiple myeloma   ADD / ADHD Brother    Stomach cancer Maternal Grandfather    Colon cancer Neg Hx    Esophageal cancer Neg Hx    Rectal cancer Neg Hx    Outpatient Medications Prior to Visit  Medication Sig Dispense Refill   albuterol (VENTOLIN HFA) 108 (90 Base) MCG/ACT inhaler INHALE 2 PUFFS BY MOUTH EVERY 4 HOURS AS NEEDED FOR WHEEZING FOR SHORTNESS OF BREATH 18 g 0   ALPRAZolam (XANAX) 1 MG tablet Take 1 tablet (1 mg total) by mouth 2 (two) times daily as needed. 20 tablet 2   Alum Hydroxide-Mag Trisilicate (GAVISCON) 80-14.2 MG CHEW Chew 2 tablets by mouth as needed (acid reflux).     atenolol (TENORMIN) 25 MG tablet Take 12.5 mg by mouth daily.     AUVI-Q 0.3 MG/0.3ML SOAJ injection Inject 0.3 mg into the muscle as needed for anaphylaxis. As directed for life-threatening allergic reactions 2 each 1   azelastine (ASTELIN) 0.1 % nasal spray Place 2 sprays into each nostril 1-2 times per day. 30 mL 5   benzonatate (TESSALON) 100 MG capsule Take 1 capsule (100 mg total) by mouth 3 (three) times daily as needed for cough. 90 capsule 1   budesonide (PULMICORT) 0.25 MG/2ML  nebulizer solution Take 0.25 mg by nebulization 2 (two) times daily as needed (Asthma).     Cholecalciferol (VITAMIN D) 50 MCG (2000 UT) CAPS Take 2,000 Units by mouth daily.     cimetidine (TAGAMET) 200 MG tablet Take 200 mg by mouth 2 (two) times daily.     diazepam (VALIUM) 10 MG tablet Take 1 tablet (10 mg total) by mouth every 6 (six) hours as needed for anxiety or sleep (vertigo/muscle spasms). 30 tablet 1   diphenhydrAMINE (BENADRYL) 12.5 MG chewable tablet Chew 25 mg by mouth 4 (four) times daily as needed for allergies.     EPINEPHrine (EPIPEN 2-PAK) 0.3 mg/0.3 mL IJ SOAJ injection INJECT CONTENTS OF 1 PEN AS NEEDED FOR ALLERGIC REACTION 2 each 2   EPINEPHrine (PRIMATENE MIST) 0.125 MG/ACT AERO Inhale 1 puff into the lungs as needed (Asthma).     estradiol (ESTRACE) 0.1 MG/GM vaginal cream Place 1 Applicatorful vaginally once a week.     famotidine (PEPCID AC MAXIMUM STRENGTH) 20 MG tablet Take 20 mg by mouth daily as needed (Hives/itching).  guaifenesin (HUMIBID E) 400 MG TABS tablet Take 400-800 mg by mouth every 4 (four) hours as needed (Cough/mucus).     ipratropium (ATROVENT) 0.06 % nasal spray Place 2 sprays into each nostril 1-2 times per day. 15 mL 5   levalbuterol (XOPENEX HFA) 45 MCG/ACT inhaler INHALE 2 PUFFS BY MOUTH EVERY 4 HOURS AS NEEDED FOR WHEEZING 15 g 0   losartan (COZAAR) 25 MG tablet Take 1 tablet (25 mg total) by mouth daily. 30 tablet 11   magnesium oxide (MAG-OX) 400 (240 Mg) MG tablet Take 400 mg by mouth daily.     methylphenidate (RITALIN) 10 MG tablet Take 1 tablet (10 mg total) by mouth 3 (three) times daily with meals. 90 tablet 0   Olopatadine HCl (PATADAY) 0.2 % SOLN Place 1 drop into both eyes 2 (two) times daily as needed (eye allergies).     rosuvastatin (CRESTOR) 10 MG tablet Take 1 tablet by mouth once daily 90 tablet 3   SUMAtriptan (IMITREX) 50 MG tablet Take 1 tablet by mouth once as needed. May repeat in 2 hours if headache persists or recurs. 10  tablet 3   aspirin EC 81 MG tablet Take 81 mg by mouth daily. Swallow whole.     No facility-administered medications prior to visit.   Allergies  Allergen Reactions   Acetaminophen Rash    Blistering lymph nodes, and blistering rash    Airduo Digihaler [Fluticasone-Salmeterol] Hives, Shortness Of Breath and Itching    Asthma attack   Allegra [Fexofenadine] Cough    She reports tachycardia and elevated BP and low Oxygen level after taking this   Aspirin Shortness Of Breath    High dose brings asthma attack    B-12 [Cyanocobalamin] Shortness Of Breath and Itching    Drop O2   Baclofen Other (See Comments)    Headache, muscle weakness, unable to urinate, unable to walk    Bactrim Anaphylaxis   Bee Venom Hives, Itching, Other (See Comments) and Rash   Benadryl [Diphenhydramine] Anaphylaxis    Only the liquid form she can not take because it causes throat closing,  DO NOT USE IN IV FORM - tablets are ok   Ciprofloxacin Shortness Of Breath    Blurred Vision, joint pain, headaches, dropped BP   Clindamycin Shortness Of Breath   Coconut (Cocos Nucifera) Hives, Itching and Other (See Comments)    Coconut Oil    Coq-10 [Coenzyme Q10] Hives, Shortness Of Breath and Itching   Cymbalta [Duloxetine Hcl] Anaphylaxis and Shortness Of Breath   Darvon [Propoxyphene] Hives, Shortness Of Breath and Itching    Darvocet*   Flunisolide Shortness Of Breath and Other (See Comments)    Aerospan Nasal Inhaler* - caused blood clots in nose    Formaldehyde Anaphylaxis   Hydrocodone-Acetaminophen Hives, Shortness Of Breath and Itching    Vicodin, Norco   Nucynta [Tapentadol] Shortness Of Breath    Coughing, SOB    Other Hives, Itching, Rash and Other (See Comments)    Insect stings/ Bites  Wine spirit  Peanuts - Bring on Asthma attack   Cold Temperatures  - causes hives, and muscle spasms    Peanut-Containing Drug Products Hives, Shortness Of Breath, Itching, Swelling and Other (See Comments)    Potassium-Containing Compounds Anaphylaxis, Itching, Swelling and Palpitations    Tables, and Oral Solution    Ramipril Anaphylaxis   Skelaxin [Metaxalone] Hives, Shortness Of Breath and Itching    Brings on Asthma Attacks    Sulfa Antibiotics Anaphylaxis  Sulfamethoxazole-Trimethoprim Anaphylaxis   Tezspire [Tezepelumab] Shortness Of Breath, Nausea And Vomiting, Swelling, Rash and Other (See Comments)    Muscle weakness, joint pain, sore throat   Triamcinolone Other (See Comments)    Kenalog Injection - muscle weakness, spasms, BP drop, lethargic, tongue swelling   Nasacort causes breathing issues    Voltaren [Diclofenac] Shortness Of Breath, Swelling and Rash    Gel*    Zofran [Ondansetron Hcl] Hives and Itching    Blurred Vision    Aspartame Other (See Comments)    Severe Migraines   Bacitra-Neomycin-Polymyxin-Hc Rash   Cranberry Extract Other (See Comments)    Throat itching / burning    Sulfur Hives and Itching   Aciphex [Rabeprazole]     Muscle weakness, headaches    Addyi [Flibanserin]     Decreased sex drive to 0   Alvesco [Ciclesonide] Other (See Comments)    Sores in mouth, coughing    Ambien [Zolpidem]     Mood Change, Aggressive, Insomnia    Amoxicillin     Pt unsure of Allergy    Ativan [Lorazepam] Other (See Comments)    Hallucination     Aygestin [Norethindrone]     Muscle weakness, abdominal pain   Celebrex [Celecoxib] Other (See Comments)    Possible Reaction - Not taken    Demeclocycline     Tetracycline Family-  GI Upset   Dexilant [Dexlansoprazole]     Severe joint pain    Doxycycline     Possibly joint pain or itch    Effexor Xr [Venlafaxine Hcl Er]     Migraines, weight gain, trouble sleeping   Elavil [Amitriptyline] Nausea And Vomiting    Headache, and muscle weakness   Erythromycin     Upset stomach   Erythromycin Base     Stomach Upset    Fentanyl Dermatitis    Patches*    Fioricet [Butalbital-Apap-Caffeine]     Acetaminophen     Hydrocodone Itching   Hydrocodone Bit-Homatrop Mbr Hives and Itching    Not true allergy. "irritated"   Hydromorphone Nausea Only   Inderal [Propranolol]     Agitation, difficulty sleeping    Indomethacin Hives and Itching   Latex Itching    Skin Peeling    Levofloxacin     Weakness, myalgia   Lyrica [Pregabalin]     Muscle weakness, Causes Falls    Maxalt [Rizatriptan] Other (See Comments)    Pt was knocked out from med, extreme drowsiness and did not help migraine    Meloxicam Itching    Increases BP   Morphine Sulfate Er     Severe insomnia, does not help pain    Naproxen Hives and Itching    Head to Toe    Neurontin [Gabapentin]     Depression    Nexium [Esomeprazole]     Bad headaches    Nortriptyline     Depression, decreased sex drive    Omeprazole-Sodium Bicarbonate     Chest pain-sore all over-HA   Ranitidine     Severe joint pain   Robaxin [Methocarbamol]     Made her very tired and unable to sleep at night Muscle weakness, fatigue    Tape Dermatitis   Tetanus Toxoid Other (See Comments)    Tetanus injection - blistering rash, swelling at injection site, turn into baseball size lump, joint pain    Tetanus-Diphtheria Toxoids Td Hives    Robust local reaction   Tetracyclines & Related     GI Upset  Tizanidine Other (See Comments)    "knocked her out"- had a rebound migraine, causes muscles to lock, and bedridden    Topamax [Topiramate]     Lethargic, daytime drowsiness, insomnia    Trazodone     Pt thinks it did not help - unsure of reaction    Tums [Calcium Carbonate] Other (See Comments)    Blistering Rash with high doses    Wound Dressing Adhesive Itching and Dermatitis    Bandages    Etodolac Rash    Blistering Rash, and swollen lymph nodes    Flexeril [Cyclobenzaprine] Palpitations    Irregular heartbeat    Ibuprofen Itching, Hives, Rash and Swelling    Throat itching   Neosporin [Neomycin-Bacitracin Zn-Polymyx] Rash    Blistering       Objective:   Today's Vitals   06/26/23 0949 06/26/23 1002  BP: (!) 144/84 134/72  Pulse: (!) 55   Temp: 97.6 F (36.4 C)   TempSrc: Temporal   SpO2: 99%   Weight: 167 lb 3.2 oz (75.8 kg)   Height: 5\' 3"  (1.6 m)    Body mass index is 29.62 kg/m.   General: Well developed, well nourished. No acute distress. Psych: Alert and oriented. Normal mood and affect.  Health Maintenance Due  Topic Date Due   HIV Screening  Never done   Zoster Vaccines- Shingrix (1 of 2) Never done     Assessment & Plan:   Problem List Items Addressed This Visit       Cardiovascular and Mediastinum   Essential hypertension - Primary    Blood pressure is elevated today. I reviewed her home BPs. This was improved since Friday, when she added atenolol back to her regimen. I sill have her continue losartan 25 mg daily and atenolol 25 mg 1/2 tab daily. I recommend we monitor this over the next month.      Relevant Medications   atenolol (TENORMIN) 25 MG tablet   Migraine headache    Since having COVID, Samantha [name] has had an increase in frequency of headaches. I discussed concern about medication overuse headache in light of taking her triptan so frequently. I will refer her to neurology for an assessment and to discuss other potential options for managing her symptoms.      Relevant Medications   atenolol (TENORMIN) 25 MG tablet   Other Relevant Orders   Ambulatory referral to Neurology     Other   COVID-19 long hauler    Samantha [name] has had a number of somatic complaints since having COVID in early August. I reassured her that these would be expected to decrease over time and likely resolve in 2-3 months.      Relevant Orders   Ambulatory referral to Neurology    Return in about 4 weeks (around 07/24/2023) for Reassessment.   Loyola Mast, MD

## 2023-06-26 NOTE — Assessment & Plan Note (Signed)
Samantha [name] has had a number of somatic complaints since having COVID in early August. I reassured her that these would be expected to decrease over time and likely resolve in 2-3 months.

## 2023-06-27 ENCOUNTER — Other Ambulatory Visit: Payer: Self-pay

## 2023-06-29 ENCOUNTER — Other Ambulatory Visit (HOSPITAL_BASED_OUTPATIENT_CLINIC_OR_DEPARTMENT_OTHER): Payer: Self-pay

## 2023-07-04 ENCOUNTER — Ambulatory Visit
Admission: EM | Admit: 2023-07-04 | Discharge: 2023-07-04 | Disposition: A | Discharge status: Home / Self Care | Payer: No Typology Code available for payment source | Attending: Internal Medicine | Admitting: Internal Medicine

## 2023-07-04 ENCOUNTER — Other Ambulatory Visit (HOSPITAL_BASED_OUTPATIENT_CLINIC_OR_DEPARTMENT_OTHER): Payer: Self-pay

## 2023-07-04 ENCOUNTER — Ambulatory Visit: Payer: No Typology Code available for payment source

## 2023-07-04 DIAGNOSIS — R0789 Other chest pain: Secondary | ICD-10-CM

## 2023-07-04 DIAGNOSIS — M25512 Pain in left shoulder: Secondary | ICD-10-CM | POA: Diagnosis not present

## 2023-07-04 DIAGNOSIS — W19XXXA Unspecified fall, initial encounter: Secondary | ICD-10-CM | POA: Diagnosis not present

## 2023-07-04 DIAGNOSIS — M79604 Pain in right leg: Secondary | ICD-10-CM

## 2023-07-04 DIAGNOSIS — M79661 Pain in right lower leg: Secondary | ICD-10-CM

## 2023-07-04 DIAGNOSIS — M25532 Pain in left wrist: Secondary | ICD-10-CM

## 2023-07-04 DIAGNOSIS — M25531 Pain in right wrist: Secondary | ICD-10-CM

## 2023-07-04 NOTE — Discharge Instructions (Addendum)
I will update you with your x-ray results later today.  For now, go ahead and start prednisone to help with anti-inflammatory properties and try to address your pain.  If your x-ray shows a fracture of the right lower leg, we will call you back to get a splint placed.  Otherwise I do recommend icing for 20 minutes only every 2 hours.  Do light activities and make sure you continue to try and take deep breaths despite your chest wall pain.

## 2023-07-04 NOTE — ED Triage Notes (Addendum)
Pt states she slipped on acorns 10/17-pain to RLE, mid back and RUE-no pain meds PTA-pt wearing ace wrap to right tib/fib calf area and right ankle-pt walking with cane/slow gait-NAD

## 2023-07-04 NOTE — ED Provider Notes (Signed)
[facility]  Note:  This document was prepared using Conservation officer, historic buildings and may include unintentional dictation errors.  MRN: [id number] DOB: 1971/07/08  Subjective:   Samantha [name] is a 52 y.o. female presenting for 5-day history of acute onset persistent multiple joint pain.  Symptoms are worse over the right ribs, right shin.  However, she has pains to both wrists, the left shoulder, the general right side of her body.  Symptoms started from an accidental fall she suffered while outside holding her dog's leash.  She slipped and wiped out on some acorns and landed directly over her back.  The fall happened very quickly and therefore cannot appreciate how she made direct contact against her shin.  Has a history of fibromyalgia, chronic pain syndrome.  She cannot tolerate steroids.  Cannot tolerate muscle relaxants.  No current facility-administered medications for this encounter.  Current Outpatient Medications:    albuterol (VENTOLIN HFA) 108 (90 Base) MCG/ACT inhaler, INHALE 2 PUFFS BY MOUTH EVERY 4 HOURS AS NEEDED FOR WHEEZING FOR SHORTNESS OF BREATH, Disp: 18 g, Rfl: 0   ALPRAZolam (XANAX) 1 MG tablet, Take 1 tablet (1 mg total) by mouth 2 (two) times daily as needed., Disp: 20 tablet, Rfl: 2   Alum Hydroxide-Mag Trisilicate (GAVISCON) 80-14.2 MG CHEW, Chew 2 tablets by mouth as needed (acid reflux)., Disp: , Rfl:    aspirin EC 81 MG tablet, Take 81 mg by mouth daily. Swallow whole., Disp: , Rfl:    atenolol (TENORMIN) 25 MG tablet, Take 12.5 mg by mouth daily., Disp: , Rfl:    AUVI-Q 0.3 MG/0.3ML SOAJ injection, Inject 0.3 mg into the muscle as needed for anaphylaxis. As directed for life-threatening allergic reactions, Disp: 2 each, Rfl: 1   azelastine (ASTELIN) 0.1 % nasal spray, Place 2 sprays into each nostril 1-2 times per day., Disp: 30 mL, Rfl: 5   benzonatate (TESSALON) 100 MG capsule, Take 1 capsule (100 mg total) by mouth 3  (three) times daily as needed for cough., Disp: 90 capsule, Rfl: 1   budesonide (PULMICORT) 0.25 MG/2ML nebulizer solution, Take 0.25 mg by nebulization 2 (two) times daily as needed (Asthma)., Disp: , Rfl:    Cholecalciferol (VITAMIN D) 50 MCG (2000 UT) CAPS, Take 2,000 Units by mouth daily., Disp: , Rfl:    cimetidine (TAGAMET) 200 MG tablet, Take 200 mg by mouth 2 (two) times daily., Disp: , Rfl:    diazepam (VALIUM) 10 MG tablet, Take 1 tablet (10 mg total) by mouth every 6 (six) hours as needed for anxiety or sleep (vertigo/muscle spasms)., Disp: 30 tablet, Rfl: 1   diphenhydrAMINE (BENADRYL) 12.5 MG chewable tablet, Chew 25 mg by mouth 4 (four) times daily as needed for allergies., Disp: , Rfl:    EPINEPHrine (EPIPEN 2-PAK) 0.3 mg/0.3 mL IJ SOAJ injection, INJECT CONTENTS OF 1 PEN AS NEEDED FOR ALLERGIC REACTION, Disp: 2 each, Rfl: 2   EPINEPHrine (PRIMATENE MIST) 0.125 MG/ACT AERO, Inhale 1 puff into the lungs as needed (Asthma)., Disp: , Rfl:    estradiol (ESTRACE) 0.1 MG/GM vaginal cream, Place 1 Applicatorful vaginally once a week., Disp: , Rfl:    famotidine (PEPCID AC MAXIMUM STRENGTH) 20 MG tablet, Take 20 mg by mouth daily as needed (Hives/itching)., Disp: , Rfl:    guaifenesin (HUMIBID E) 400 MG TABS tablet, Take 400-800 mg by mouth every 4 (four) hours as needed (Cough/mucus)., Disp: , Rfl:    ipratropium (ATROVENT) 0.06 % nasal spray, Place 2  sprays into each nostril 1-2 times per day., Disp: 15 mL, Rfl: 5   levalbuterol (XOPENEX HFA) 45 MCG/ACT inhaler, INHALE 2 PUFFS BY MOUTH EVERY 4 HOURS AS NEEDED FOR WHEEZING, Disp: 15 g, Rfl: 0   losartan (COZAAR) 25 MG tablet, Take 1 tablet (25 mg total) by mouth daily., Disp: 30 tablet, Rfl: 11   magnesium oxide (MAG-OX) 400 (240 Mg) MG tablet, Take 400 mg by mouth daily., Disp: , Rfl:    methylphenidate (RITALIN) 10 MG tablet, Take 1 tablet (10 mg total) by mouth 3 (three) times daily with meals., Disp: 90 tablet, Rfl: 0   Olopatadine HCl  (PATADAY) 0.2 % SOLN, Place 1 drop into both eyes 2 (two) times daily as needed (eye allergies)., Disp: , Rfl:    rosuvastatin (CRESTOR) 10 MG tablet, Take 1 tablet by mouth once daily, Disp: 90 tablet, Rfl: 3   SUMAtriptan (IMITREX) 50 MG tablet, Take 1 tablet by mouth once as needed. May repeat in 2 hours if headache persists or recurs., Disp: 10 tablet, Rfl: 3   Allergies  Allergen Reactions   Acetaminophen Rash    Blistering lymph nodes, and blistering rash    Airduo Digihaler [Fluticasone-Salmeterol] Hives, Shortness Of Breath and Itching    Asthma attack   Allegra [Fexofenadine] Cough    She reports tachycardia and elevated BP and low Oxygen level after taking this   Aspirin Shortness Of Breath    High dose brings asthma attack    B-12 [Cyanocobalamin] Shortness Of Breath and Itching    Drop O2   Baclofen Other (See Comments)    Headache, muscle weakness, unable to urinate, unable to walk    Bactrim Anaphylaxis   Bee Venom Hives, Itching, Other (See Comments) and Rash   Benadryl [Diphenhydramine] Anaphylaxis    Only the liquid form she can not take because it causes throat closing,  DO NOT USE IN IV FORM - tablets are ok   Ciprofloxacin Shortness Of Breath    Blurred Vision, joint pain, headaches, dropped BP   Clindamycin Shortness Of Breath   Coconut (Cocos Nucifera) Hives, Itching and Other (See Comments)    Coconut Oil    Coq-10 [Coenzyme Q10] Hives, Shortness Of Breath and Itching   Cymbalta [Duloxetine Hcl] Anaphylaxis and Shortness Of Breath   Darvon [Propoxyphene] Hives, Shortness Of Breath and Itching    Darvocet*   Flunisolide Shortness Of Breath and Other (See Comments)    Aerospan Nasal Inhaler* - caused blood clots in nose    Formaldehyde Anaphylaxis   Hydrocodone-Acetaminophen Hives, Shortness Of Breath and Itching    Vicodin, Norco   Nucynta [Tapentadol] Shortness Of Breath    Coughing, SOB    Other Hives, Itching, Rash and Other (See Comments)    Insect  stings/ Bites  Wine spirit  Peanuts - Bring on Asthma attack   Cold Temperatures  - causes hives, and muscle spasms    Peanut-Containing Drug Products Hives, Shortness Of Breath, Itching, Swelling and Other (See Comments)   Potassium-Containing Compounds Anaphylaxis, Itching, Swelling and Palpitations    Tables, and Oral Solution    Ramipril Anaphylaxis   Skelaxin [Metaxalone] Hives, Shortness Of Breath and Itching    Brings on Asthma Attacks    Sulfa Antibiotics Anaphylaxis   Sulfamethoxazole-Trimethoprim Anaphylaxis   Tezspire [Tezepelumab] Shortness Of Breath, Nausea And Vomiting, Swelling, Rash and Other (See Comments)    Muscle weakness, joint pain, sore throat   Triamcinolone Other (See Comments)    Kenalog Injection -  muscle weakness, spasms, BP drop, lethargic, tongue swelling   Nasacort causes breathing issues    Voltaren [Diclofenac] Shortness Of Breath, Swelling and Rash    Gel*    Zofran [Ondansetron Hcl] Hives and Itching    Blurred Vision    Aspartame Other (See Comments)    Severe Migraines   Bacitra-Neomycin-Polymyxin-Hc Rash   Cranberry Extract Other (See Comments)    Throat itching / burning    Sulfur Hives and Itching   Aciphex [Rabeprazole]     Muscle weakness, headaches    Addyi [Flibanserin]     Decreased sex drive to 0   Alvesco [Ciclesonide] Other (See Comments)    Sores in mouth, coughing    Ambien [Zolpidem]     Mood Change, Aggressive, Insomnia    Amoxicillin     Pt unsure of Allergy    Ativan [Lorazepam] Other (See Comments)    Hallucination     Aygestin [Norethindrone]     Muscle weakness, abdominal pain   Celebrex [Celecoxib] Other (See Comments)    Possible Reaction - Not taken    Demeclocycline     Tetracycline Family-  GI Upset   Dexilant [Dexlansoprazole]     Severe joint pain    Doxycycline     Possibly joint pain or itch    Effexor Xr [Venlafaxine Hcl Er]     Migraines, weight gain, trouble sleeping   Elavil [Amitriptyline]  Nausea And Vomiting    Headache, and muscle weakness   Erythromycin     Upset stomach   Erythromycin Base     Stomach Upset    Fentanyl Dermatitis    Patches*    Fioricet [Butalbital-Apap-Caffeine]     Acetaminophen    Hydrocodone Itching   Hydrocodone Bit-Homatrop Mbr Hives and Itching    Not true allergy. "irritated"   Hydromorphone Nausea Only   Inderal [Propranolol]     Agitation, difficulty sleeping    Indomethacin Hives and Itching   Latex Itching    Skin Peeling    Levofloxacin     Weakness, myalgia   Lyrica [Pregabalin]     Muscle weakness, Causes Falls    Maxalt [Rizatriptan] Other (See Comments)    Pt was knocked out from med, extreme drowsiness and did not help migraine    Meloxicam Itching    Increases BP   Morphine Sulfate Er     Severe insomnia, does not help pain    Naproxen Hives and Itching    Head to Toe    Neurontin [Gabapentin]     Depression    Nexium [Esomeprazole]     Bad headaches    Nortriptyline     Depression, decreased sex drive    Omeprazole-Sodium Bicarbonate     Chest pain-sore all over-HA   Ranitidine     Severe joint pain   Robaxin [Methocarbamol]     Made her very tired and unable to sleep at night Muscle weakness, fatigue    Tape Dermatitis   Tetanus Toxoid Other (See Comments)    Tetanus injection - blistering rash, swelling at injection site, turn into baseball size lump, joint pain    Tetanus-Diphtheria Toxoids Td Hives    Robust local reaction   Tetracyclines & Related     GI Upset    Tizanidine Other (See Comments)    "knocked her out"- had a rebound migraine, causes muscles to lock, and bedridden    Topamax [Topiramate]     Lethargic, daytime drowsiness, insomnia    Trazodone  Pt thinks it did not help - unsure of reaction    Tums [Calcium Carbonate] Other (See Comments)    Blistering Rash with high doses    Wound Dressing Adhesive Itching and Dermatitis    Bandages    Etodolac Rash    Blistering Rash, and  swollen lymph nodes    Flexeril [Cyclobenzaprine] Palpitations    Irregular heartbeat    Ibuprofen Itching, Hives, Rash and Swelling    Throat itching   Neosporin [Neomycin-Bacitracin Zn-Polymyx] Rash    Blistering     Past Medical History:  Diagnosis Date   ADD (attention deficit disorder)    Allergy    Anaphylaxis    Anemia    Anxiety    Arthritis    Asthma    Chronic back pain    Chronic kidney disease    Chronic neck pain    Complication of anesthesia    Waking up before procedure was over.   Degenerative disc disease at L5-S1 level    Dysrhythmia    Had  "abnormal beats" d/t B/P med she was on. B/P med reduced.   Endometriosis    Familial cold urticaria    Family history of adverse reaction to anesthesia    Fibromyalgia    GERD (gastroesophageal reflux disease)    Headache    Migraines   Hernia    Hyperlipidemia    Hypersomnia    Hypertension    IBS (irritable bowel syndrome)    IC (interstitial cystitis)    IgG Gliadin antibody positive    Low blood potassium    Migraines    Multiple allergies    OCD (obsessive compulsive disorder)    Raynaud disease    Raynaud's disease    Umbilical hernia    Vertigo    Vertigo      Past Surgical History:  Procedure Laterality Date   BLADDER SURGERY     age 67- 23   CERVICAL FUSION     08/1998, 03/10/2017   CESAREAN SECTION     COLONOSCOPY     COMBINED HYSTEROSCOPY DIAGNOSTIC / D&C     CYSTO WITH HYDRODISTENSION N/A 02/22/2023   Procedure: HYDRODISTENSION;  Surgeon: Despina Arias, MD;  Location: [facility];  Service: Urology;  Laterality: N/A;   CYSTOSCOPY W/ RETROGRADES N/A 02/22/2023   Procedure: CYSTOSCOPY WITH BILATERAL RETROGRADE PYELOGRAM;  Surgeon: Despina Arias, MD;  Location: [facility];  Service: Urology;  Laterality: N/A;  60 MINUTES NEEDED FOR CASE   CYSTOSCOPY WITH URETHRAL DILATATION N/A 02/22/2023   Procedure: URETHRAL DILATATION;  Surgeon: Despina Arias, MD;  Location: [facility];  Service: Urology;   Laterality: N/A;   HAND SURGERY     2012, 2013   LAPAROSCOPIC ENDOMETRIOSIS FULGURATION Right    LESION REMOVAL  08/23/2012   Procedure: LESION REMOVAL HAND;  Surgeon: Wyn Forster., MD;  Location: [facility];  Service: Orthopedics;  Laterality: Left;   ROTATOR CUFF REPAIR     UPPER GASTROINTESTINAL ENDOSCOPY      Family History  Problem Relation Age of Onset   Cancer Father        Multiple myeloma   ADD / ADHD Brother    Stomach cancer Maternal Grandfather    Colon cancer Neg Hx    Esophageal cancer Neg Hx    Rectal cancer Neg Hx     Social History   Tobacco Use   Smoking status: Never   Smokeless tobacco: Never  Vaping Use  Vaping status: Never Used  Substance Use Topics   Alcohol use: Yes    Comment: occasional   Drug use: No    ROS   Objective:   Vitals: BP 113/76 (BP Location: Right Arm)   Pulse 65   Temp 97.6 F (36.4 C) (Oral)   Resp 20   LMP  (LMP Unknown)   SpO2 94%   Physical Exam Constitutional:      General: She is not in acute distress.    Appearance: Normal appearance. She is well-developed. She is not ill-appearing, toxic-appearing or diaphoretic.  HENT:     Head: Normocephalic and atraumatic.     Nose: Nose normal.     Mouth/Throat:     Mouth: Mucous membranes are moist.  Eyes:     General: No scleral icterus.       Right eye: No discharge.        Left eye: No discharge.     Extraocular Movements: Extraocular movements intact.  Cardiovascular:     Rate and Rhythm: Normal rate and regular rhythm.     Heart sounds: Normal heart sounds. No murmur heard.    No friction rub. No gallop.  Pulmonary:     Effort: Pulmonary effort is normal. No respiratory distress.     Breath sounds: No stridor. No wheezing, rhonchi or rales.  Musculoskeletal:     Comments: Full range of motion throughout.  Strength 5/5 for upper and lower extremities.  Patient ambulates without any assistance at expected pace.  No ecchymosis,  swelling, lacerations or abrasions.  Patient does have paraspinal muscle tenderness along the entire back excluding the midline.  Has focal tenderness with swelling across the anterior right lower leg with surrounding ecchymosis.  No wounds.  No warmth, erythema.  Negative Homans' sign.  Exquisite tenderness over the right lower lateral ribs, chest wall.  Skin:    General: Skin is warm and dry.  Neurological:     General: No focal deficit present.     Mental Status: She is alert and oriented to person, place, and time.  Psychiatric:        Mood and Affect: Mood normal.        Behavior: Behavior normal.     DG Ribs Unilateral W/Chest Right  Result Date: 07/04/2023 CLINICAL DATA:  Right rib pain after slipping and falling on a corns EXAM: RIGHT RIBS AND CHEST - 3+ VIEW COMPARISON:  None Available. FINDINGS: No fracture or other bone lesions are seen involving the ribs. There is no evidence of pneumothorax or pleural effusion. Both lungs are clear. Heart size and mediastinal contours are within normal limits. IMPRESSION: Negative. Electronically Signed   By: Malachy Moan M.D.   On: 07/04/2023 12:59   DG Tibia/Fibula Right  Result Date: 07/04/2023 CLINICAL DATA:  Right lower leg pain. Status post fall after slipping on acorns. EXAM: RIGHT TIBIA AND FIBULA - 2 VIEW COMPARISON:  Right ankle 04/24/2023 FINDINGS: There is no evidence of fracture or other focal bone lesions. Soft tissues are unremarkable. IMPRESSION: Negative. Electronically Signed   By: Signa Kell M.D.   On: 07/04/2023 12:58     Assessment and Plan :   PDMP not reviewed this encounter.  1. Right-sided chest wall pain   2. Acute pain of right lower extremity   3. Acute pain of left shoulder   4. Bilateral wrist pain   5. Accidental fall, initial encounter    Recommended an oral prednisone course for her musculoskeletal pain as  she cannot tolerate many medications.  Anticipatory guidance provided otherwise.  Counseled  patient on potential for adverse effects with medications prescribed/recommended today, ER and return-to-clinic precautions discussed, patient verbalized understanding.    Wallis Bamberg, [place] 07/04/23 1610

## 2023-07-11 ENCOUNTER — Other Ambulatory Visit (HOSPITAL_BASED_OUTPATIENT_CLINIC_OR_DEPARTMENT_OTHER): Payer: Self-pay

## 2023-07-11 ENCOUNTER — Other Ambulatory Visit: Payer: Self-pay | Admitting: Family Medicine

## 2023-07-11 ENCOUNTER — Other Ambulatory Visit: Payer: Self-pay

## 2023-07-11 ENCOUNTER — Other Ambulatory Visit: Payer: Self-pay | Admitting: Internal Medicine

## 2023-07-11 DIAGNOSIS — F908 Attention-deficit hyperactivity disorder, other type: Secondary | ICD-10-CM

## 2023-07-11 DIAGNOSIS — F411 Generalized anxiety disorder: Secondary | ICD-10-CM

## 2023-07-11 DIAGNOSIS — F4001 Agoraphobia with panic disorder: Secondary | ICD-10-CM

## 2023-07-11 MED ORDER — METHYLPHENIDATE HCL 10 MG PO TABS
10.0000 mg | ORAL_TABLET | Freq: Three times a day (TID) | ORAL | 0 refills | Status: DC
  Filled 2023-07-11: qty 90, 30d supply, fill #0

## 2023-07-11 MED ORDER — ALPRAZOLAM 1 MG PO TABS
1.0000 mg | ORAL_TABLET | Freq: Two times a day (BID) | ORAL | 2 refills | Status: DC | PRN
  Filled 2023-07-11: qty 20, 10d supply, fill #0
  Filled 2023-07-28: qty 20, 10d supply, fill #1

## 2023-07-11 MED ORDER — ESTRADIOL 0.1 MG/GM VA CREA
1.0000 g | TOPICAL_CREAM | VAGINAL | 3 refills | Status: AC
Start: 2023-07-13 — End: ?
  Filled 2023-07-11: qty 42.5, 90d supply, fill #0
  Filled 2023-10-25: qty 42.5, 90d supply, fill #1
  Filled 2024-03-10: qty 42.5, 90d supply, fill #2

## 2023-07-11 MED ORDER — DIAZEPAM 10 MG PO TABS
10.0000 mg | ORAL_TABLET | Freq: Four times a day (QID) | ORAL | 1 refills | Status: DC | PRN
  Filled 2023-07-11: qty 30, 8d supply, fill #0
  Filled 2023-07-28: qty 30, 8d supply, fill #1

## 2023-07-11 MED ORDER — LEVALBUTEROL TARTRATE 45 MCG/ACT IN AERO
2.0000 | INHALATION_SPRAY | Freq: Four times a day (QID) | RESPIRATORY_TRACT | 1 refills | Status: DC | PRN
  Filled 2023-07-11: qty 15, 19d supply, fill #0
  Filled 2023-12-18: qty 15, 30d supply, fill #0

## 2023-07-21 ENCOUNTER — Other Ambulatory Visit (HOSPITAL_BASED_OUTPATIENT_CLINIC_OR_DEPARTMENT_OTHER): Payer: Self-pay

## 2023-07-21 MED ORDER — FLUCONAZOLE 150 MG PO TABS
150.0000 mg | ORAL_TABLET | ORAL | 0 refills | Status: DC
  Filled 2023-07-21: qty 2, 6d supply, fill #0

## 2023-07-21 MED ORDER — METRONIDAZOLE 500 MG PO TABS
500.0000 mg | ORAL_TABLET | Freq: Two times a day (BID) | ORAL | 0 refills | Status: DC
  Filled 2023-07-21: qty 14, 7d supply, fill #0

## 2023-07-28 ENCOUNTER — Other Ambulatory Visit (HOSPITAL_BASED_OUTPATIENT_CLINIC_OR_DEPARTMENT_OTHER): Payer: Self-pay

## 2023-07-28 ENCOUNTER — Other Ambulatory Visit: Payer: Self-pay

## 2023-07-28 ENCOUNTER — Ambulatory Visit: Payer: No Typology Code available for payment source | Admitting: Family Medicine

## 2023-07-28 ENCOUNTER — Other Ambulatory Visit: Payer: Self-pay | Admitting: Family Medicine

## 2023-07-28 VITALS — BP 118/76 | HR 78 | Temp 98.7°F | Ht 63.0 in | Wt 167.0 lb

## 2023-07-28 DIAGNOSIS — F4001 Agoraphobia with panic disorder: Secondary | ICD-10-CM

## 2023-07-28 DIAGNOSIS — F411 Generalized anxiety disorder: Secondary | ICD-10-CM | POA: Diagnosis not present

## 2023-07-28 DIAGNOSIS — I10 Essential (primary) hypertension: Secondary | ICD-10-CM

## 2023-07-28 DIAGNOSIS — F908 Attention-deficit hyperactivity disorder, other type: Secondary | ICD-10-CM

## 2023-07-28 MED ORDER — ALPRAZOLAM 1 MG PO TABS
1.0000 mg | ORAL_TABLET | Freq: Two times a day (BID) | ORAL | 2 refills | Status: DC | PRN
  Filled 2023-08-08 – 2023-09-04 (×2): qty 20, 10d supply, fill #0
  Filled 2023-10-02: qty 20, 10d supply, fill #1
  Filled 2023-10-12: qty 20, 10d supply, fill #2

## 2023-07-28 MED ORDER — DIAZEPAM 10 MG PO TABS
10.0000 mg | ORAL_TABLET | Freq: Four times a day (QID) | ORAL | 2 refills | Status: DC | PRN
  Filled 2023-08-08 – 2023-09-04 (×2): qty 30, 8d supply, fill #0
  Filled 2023-10-02: qty 30, 8d supply, fill #1
  Filled 2023-10-12: qty 30, 8d supply, fill #2

## 2023-07-28 NOTE — Assessment & Plan Note (Signed)
Blood pressure is at goal. Continue losartan 25 mg 1/2 tab daily and atenolol 25 mg 1/2 tab daily. I recommend we continue to monitor this.

## 2023-07-28 NOTE — Assessment & Plan Note (Signed)
Stable. Overall improved control of anxiety. Continue diazepam for baseline control and alprazolam as needed for panic attacks.

## 2023-07-28 NOTE — Progress Notes (Signed)
[facility] PRIMARY CARE [facility] PRIMARY Samantha [name] [facility] [address] [place] [address] Dept: [phone] Dept Fax: [phone]  Chronic Care Office Visit  Subjective:    Patient ID: Samantha [name], female    DOB: 1971/02/06, 52 y.o..   MRN: [id number]  Chief Complaint  Patient presents with   Follow-up    4 week f/u.     History of Present Illness:  Patient is in today for reassessment of chronic medical issues.  At her last visit in mid-October, Ms. Samantha [name] noted noted some lability in her blood pressures. Samantha [name] had COVID in early August. Since that time, she had noted her BP to be spiking quite high at times. She has a history of hypertension and had been managed on atenolol 25 mg 1/2 tab daily. At her visit 2 weeks prior, I tried switching her from atenolol to losartan. After that change, she noted a number of physical complaints, including headache, cough, increased asthma, nausea, decreased libido, nasal congestion, lightheadedness, vertigo, tachycardia, low energy, lack of appetite, and joint and body spasms. She also did not note any improvement in her blood pressure. She has continued use of the losartan, but had added her atenolol back. As she had only recently made the change, she is back today for [place] to reassess. Although she notes an occasional spike, her BP overall has been improved. She has had some recurrent symptoms, so cut her losartan in half and seems to be tolerating this better.  Past Medical History: Patient Active Problem List   Diagnosis Date Noted   COVID-19 long hauler 06/26/2023   Gross hematuria 04/27/2023   Renal mass 04/24/2023   Cervical spine arthritis 02/25/2022   Leg swelling 10/19/2021   Interstitial cystitis 09/15/2021   Other urethral stricture, female 09/15/2021   Chronic kidney disease, stage 3a (HCC) 08/20/2021   Prediabetes 08/27/2020   History of [place] spotted fever 02/04/2020   Moderate persistent  asthma without complication 08/02/2019   LPRD (laryngopharyngeal reflux disease) 08/02/2019   Chronic rhinitis 08/02/2019   Chronic post-traumatic stress disorder (PTSD) 11/21/2018   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) 11/21/2018   Pulmonary nodules 11/13/2018   Other microscopic hematuria 11/06/2018   Generalized anxiety disorder 07/04/2018   Fecal incontinence 07/02/2018   Leg weakness 06/12/2017   Left ear hearing loss 02/07/2017   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   Borderline hyperlipidemia 11/08/2016   Acute idiopathic gout of multiple sites 11/08/2016   Elbow pain, chronic, right 10/06/2016   Lateral epicondylitis, right elbow 10/06/2016   Fibromyalgia 10/04/2016   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   Migraine headache 07/29/2016   Irritable bowel syndrome with both constipation and diarrhea 07/29/2016   Hives 07/18/2016   Urinary incontinence 06/02/2016   Atherosclerosis of abdominal aorta (HCC) 09/03/2015   Carpal tunnel syndrome 11/19/2014   Primary hypersomnia 11/18/2014   Insomnia 11/18/2014   Lumbar radicular pain 11/18/2014   Chronic pain syndrome 11/18/2014   Cervical disc disorder with radiculopathy 11/18/2014   Chronic female pelvic pain 07/31/2014   Degeneration of intervertebral disc of lumbosacral region 07/31/2014   Rosacea 07/31/2014   Morton's neuroma 07/31/2014   Benign paroxysmal positional vertigo 07/31/2014   Essential hypertension 07/31/2014   History of methicillin resistant Staphylococcus aureus infection 07/09/2014   Past Surgical History:  Procedure Laterality Date   BLADDER SURGERY     age 17- 37   CERVICAL FUSION     08/1998, 03/10/2017   CESAREAN SECTION  COLONOSCOPY     COMBINED HYSTEROSCOPY DIAGNOSTIC / D&C     CYSTO WITH HYDRODISTENSION N/A 02/22/2023   Procedure: HYDRODISTENSION;  Surgeon: Despina Arias, MD;  Location: [facility];  Service: Urology;  Laterality: N/A;    CYSTOSCOPY W/ RETROGRADES N/A 02/22/2023   Procedure: CYSTOSCOPY WITH BILATERAL RETROGRADE PYELOGRAM;  Surgeon: Despina Arias, MD;  Location: [facility];  Service: Urology;  Laterality: N/A;  60 MINUTES NEEDED FOR CASE   CYSTOSCOPY WITH URETHRAL DILATATION N/A 02/22/2023   Procedure: URETHRAL DILATATION;  Surgeon: Despina Arias, MD;  Location: [facility];  Service: Urology;  Laterality: N/A;   HAND SURGERY     2012, 2013   LAPAROSCOPIC ENDOMETRIOSIS FULGURATION Right    LESION REMOVAL  08/23/2012   Procedure: LESION REMOVAL HAND;  Surgeon: Wyn Forster., MD;  Location: [facility];  Service: Orthopedics;  Laterality: Left;   ROTATOR CUFF REPAIR     UPPER GASTROINTESTINAL ENDOSCOPY     Family History  Problem Relation Age of Onset   Cancer Father        Multiple myeloma   ADD / ADHD Brother    Stomach cancer Maternal Grandfather    Colon cancer Neg Hx    Esophageal cancer Neg Hx    Rectal cancer Neg Hx    Outpatient Medications Prior to Visit  Medication Sig Dispense Refill   albuterol (VENTOLIN HFA) 108 (90 Base) MCG/ACT inhaler INHALE 2 PUFFS BY MOUTH EVERY 4 HOURS AS NEEDED FOR WHEEZING FOR SHORTNESS OF BREATH 18 g 0   Alum Hydroxide-Mag Trisilicate (GAVISCON) 80-14.2 MG CHEW Chew 2 tablets by mouth as needed (acid reflux).     aspirin EC 81 MG tablet Take 81 mg by mouth daily. Swallow whole.     atenolol (TENORMIN) 25 MG tablet Take 12.5 mg by mouth daily.     AUVI-Q 0.3 MG/0.3ML SOAJ injection Inject 0.3 mg into the muscle as needed for anaphylaxis. As directed for life-threatening allergic reactions 2 each 1   azelastine (ASTELIN) 0.1 % nasal spray Place 2 sprays into each nostril 1-2 times per day. 30 mL 5   benzonatate (TESSALON) 100 MG capsule Take 1 capsule (100 mg total) by mouth 3 (three) times daily as needed for cough. 90 capsule 1   budesonide (PULMICORT) 0.25 MG/2ML nebulizer solution Take 0.25 mg by nebulization 2 (two) times daily as needed (Asthma).      Cholecalciferol (VITAMIN D) 50 MCG (2000 UT) CAPS Take 2,000 Units by mouth daily.     cimetidine (TAGAMET) 200 MG tablet Take 200 mg by mouth 2 (two) times daily.     diphenhydrAMINE (BENADRYL) 12.5 MG chewable tablet Chew 25 mg by mouth 4 (four) times daily as needed for allergies.     EPINEPHrine (EPIPEN 2-PAK) 0.3 mg/0.3 mL IJ SOAJ injection INJECT CONTENTS OF 1 PEN AS NEEDED FOR ALLERGIC REACTION 2 each 2   EPINEPHrine (PRIMATENE MIST) 0.125 MG/ACT AERO Inhale 1 puff into the lungs as needed (Asthma).     estradiol (ESTRACE) 0.1 MG/GM vaginal cream Place 1 Applicatorful vaginally once a week.     estradiol (ESTRACE) 0.1 MG/GM vaginal cream Place 1 gram vaginally 2 (two) times a week at bedtime for 90 days. 42.5 g 3   famotidine (PEPCID AC MAXIMUM STRENGTH) 20 MG tablet Take 20 mg by mouth daily as needed (Hives/itching).     fluconazole (DIFLUCAN) 150 MG tablet Take 1 tablet (150 mg total) by mouth every 3 (three) days for  6 days. 2 tablet 0   guaifenesin (HUMIBID E) 400 MG TABS tablet Take 400-800 mg by mouth every 4 (four) hours as needed (Cough/mucus).     ipratropium (ATROVENT) 0.06 % nasal spray Place 2 sprays into each nostril 1-2 times per day. 15 mL 5   levalbuterol (XOPENEX HFA) 45 MCG/ACT inhaler Inhale 2 puffs into the lungs every 6 (six) hours as needed for wheezing. 15 g 1   losartan (COZAAR) 25 MG tablet Take 1 tablet (25 mg total) by mouth daily. 30 tablet 11   magnesium oxide (MAG-OX) 400 (240 Mg) MG tablet Take 400 mg by mouth daily.     methylphenidate (RITALIN) 10 MG tablet Take 1 tablet (10 mg total) by mouth 3 (three) times daily with meals. 90 tablet 0   metroNIDAZOLE (FLAGYL) 500 MG tablet Take 1 tablet (500 mg total) by mouth every 12 (twelve) hours for 7 days. 14 tablet 0   Olopatadine HCl (PATADAY) 0.2 % SOLN Place 1 drop into both eyes 2 (two) times daily as needed (eye allergies).     rosuvastatin (CRESTOR) 10 MG tablet Take 1 tablet by mouth once daily 90 tablet  3   SUMAtriptan (IMITREX) 50 MG tablet Take 1 tablet by mouth once as needed. May repeat in 2 hours if headache persists or recurs. 10 tablet 3   ALPRAZolam (XANAX) 1 MG tablet Take 1 tablet (1 mg total) by mouth 2 (two) times daily as needed. 20 tablet 2   diazepam (VALIUM) 10 MG tablet Take 1 tablet (10 mg total) by mouth every 6 (six) hours as needed for anxiety or sleep (vertigo/muscle spasms). 30 tablet 1   No facility-administered medications prior to visit.   Allergies  Allergen Reactions   Acetaminophen Rash    Blistering lymph nodes, and blistering rash    Airduo Digihaler [Fluticasone-Salmeterol] Hives, Shortness Of Breath and Itching    Asthma attack   Allegra [Fexofenadine] Cough    She reports tachycardia and elevated BP and low Oxygen level after taking this   Aspirin Shortness Of Breath    High dose brings asthma attack    B-12 [Cyanocobalamin] Shortness Of Breath and Itching    Drop O2   Baclofen Other (See Comments)    Headache, muscle weakness, unable to urinate, unable to walk    Bactrim Anaphylaxis   Bee Venom Hives, Itching, Other (See Comments) and Rash   Benadryl [Diphenhydramine] Anaphylaxis    Only the liquid form she can not take because it causes throat closing,  DO NOT USE IN IV FORM - tablets are ok   Ciprofloxacin Shortness Of Breath    Blurred Vision, joint pain, headaches, dropped BP   Clindamycin Shortness Of Breath   Coconut (Cocos Nucifera) Hives, Itching and Other (See Comments)    Coconut Oil    Coq-10 [Coenzyme Q10] Hives, Shortness Of Breath and Itching   Cymbalta [Duloxetine Hcl] Anaphylaxis and Shortness Of Breath   Darvon [Propoxyphene] Hives, Shortness Of Breath and Itching    Darvocet*   Flunisolide Shortness Of Breath and Other (See Comments)    Aerospan Nasal Inhaler* - caused blood clots in nose    Formaldehyde Anaphylaxis   Hydrocodone-Acetaminophen Hives, Shortness Of Breath and Itching    Vicodin, Norco   Nucynta [Tapentadol]  Shortness Of Breath    Coughing, SOB    Other Hives, Itching, Rash and Other (See Comments)    Insect stings/ Bites  Wine spirit  Peanuts - Bring on Asthma attack  Cold Temperatures  - causes hives, and muscle spasms    Peanut-Containing Drug Products Hives, Shortness Of Breath, Itching, Swelling and Other (See Comments)   Potassium-Containing Compounds Anaphylaxis, Itching, Swelling and Palpitations    Tables, and Oral Solution    Ramipril Anaphylaxis   Skelaxin [Metaxalone] Hives, Shortness Of Breath and Itching    Brings on Asthma Attacks    Sulfa Antibiotics Anaphylaxis   Sulfamethoxazole-Trimethoprim Anaphylaxis   Tezspire [Tezepelumab] Shortness Of Breath, Nausea And Vomiting, Swelling, Rash and Other (See Comments)    Muscle weakness, joint pain, sore throat   Triamcinolone Other (See Comments)    Kenalog Injection - muscle weakness, spasms, BP drop, lethargic, tongue swelling   Nasacort causes breathing issues    Voltaren [Diclofenac] Shortness Of Breath, Swelling and Rash    Gel*    Zofran [Ondansetron Hcl] Hives and Itching    Blurred Vision    Aspartame Other (See Comments)    Severe Migraines   Bacitra-Neomycin-Polymyxin-Hc Rash   Cranberry Extract Other (See Comments)    Throat itching / burning    Sulfur Hives and Itching   Aciphex [Rabeprazole]     Muscle weakness, headaches    Addyi [Flibanserin]     Decreased sex drive to 0   Alvesco [Ciclesonide] Other (See Comments)    Sores in mouth, coughing    Ambien [Zolpidem]     Mood Change, Aggressive, Insomnia    Amoxicillin     Pt unsure of Allergy    Ativan [Lorazepam] Other (See Comments)    Hallucination     Aygestin [Norethindrone]     Muscle weakness, abdominal pain   Celebrex [Celecoxib] Other (See Comments)    Possible Reaction - Not taken    Demeclocycline     Tetracycline Family-  GI Upset   Dexilant [Dexlansoprazole]     Severe joint pain    Doxycycline     Possibly joint pain or itch     Effexor Xr [Venlafaxine Hcl Er]     Migraines, weight gain, trouble sleeping   Elavil [Amitriptyline] Nausea And Vomiting    Headache, and muscle weakness   Erythromycin     Upset stomach   Erythromycin Base     Stomach Upset    Fentanyl Dermatitis    Patches*    Fioricet [Butalbital-Apap-Caffeine]     Acetaminophen    Hydrocodone Itching   Hydrocodone Bit-Homatrop Mbr Hives and Itching    Not true allergy. "irritated"   Hydromorphone Nausea Only   Inderal [Propranolol]     Agitation, difficulty sleeping    Indomethacin Hives and Itching   Latex Itching    Skin Peeling    Levofloxacin     Weakness, myalgia   Lyrica [Pregabalin]     Muscle weakness, Causes Falls    Maxalt [Rizatriptan] Other (See Comments)    Pt was knocked out from med, extreme drowsiness and did not help migraine    Meloxicam Itching    Increases BP   Morphine Sulfate Er     Severe insomnia, does not help pain    Naproxen Hives and Itching    Head to Toe    Neurontin [Gabapentin]     Depression    Nexium [Esomeprazole]     Bad headaches    Nitrofuran Derivatives    Nortriptyline     Depression, decreased sex drive    Omeprazole-Sodium Bicarbonate     Chest pain-sore all over-HA   Ranitidine     Severe joint pain  Robaxin [Methocarbamol]     Made her very tired and unable to sleep at night Muscle weakness, fatigue    Tape Dermatitis   Tetanus Toxoid Other (See Comments)    Tetanus injection - blistering rash, swelling at injection site, turn into baseball size lump, joint pain    Tetanus-Diphtheria Toxoids Td Hives    Robust local reaction   Tetracyclines & Related     GI Upset    Tizanidine Other (See Comments)    "knocked her out"- had a rebound migraine, causes muscles to lock, and bedridden    Topamax [Topiramate]     Lethargic, daytime drowsiness, insomnia    Trazodone     Pt thinks it did not help - unsure of reaction    Tums [Calcium Carbonate] Other (See Comments)    Blistering  Rash with high doses    Wound Dressing Adhesive Itching and Dermatitis    Bandages    Etodolac Rash    Blistering Rash, and swollen lymph nodes    Flexeril [Cyclobenzaprine] Palpitations    Irregular heartbeat    Ibuprofen Itching, Hives, Rash and Swelling    Throat itching   Neosporin [Neomycin-Bacitracin Zn-Polymyx] Rash    Blistering    Objective:   Today's Vitals   07/28/23 1053  BP: 118/76  Pulse: 78  Temp: 98.7 F (37.1 C)  TempSrc: Temporal  SpO2: 98%  Weight: 167 lb (75.8 kg)  Height: 5\' 3"  (1.6 m)   Body mass index is 29.58 kg/m.   General: Well developed, well nourished. No acute distress. Psych: Alert and oriented. Normal mood and affect.  Health Maintenance Due  Topic Date Due   HIV Screening  Never done   Zoster Vaccines- Shingrix (1 of 2) Never done     Assessment & Plan:   Problem List Items Addressed This Visit       Cardiovascular and Mediastinum   Essential hypertension - Primary    Blood pressure is at goal. Continue losartan 25 mg 1/2 tab daily and atenolol 25 mg 1/2 tab daily. I recommend we continue to monitor this.        Other   Generalized anxiety disorder    Stable. Overall improved control of anxiety. Continue diazepam for baseline control and alprazolam as needed for panic attacks.      Relevant Medications   ALPRAZolam (XANAX) 1 MG tablet   diazepam (VALIUM) 10 MG tablet   Other Visit Diagnoses     Panic disorder with agoraphobia       Relevant Medications   ALPRAZolam (XANAX) 1 MG tablet   diazepam (VALIUM) 10 MG tablet       Return in about 3 months (around 10/28/2023) for Reassessment.   Loyola Mast, MD

## 2023-08-08 ENCOUNTER — Other Ambulatory Visit (HOSPITAL_BASED_OUTPATIENT_CLINIC_OR_DEPARTMENT_OTHER): Payer: Self-pay

## 2023-08-08 ENCOUNTER — Other Ambulatory Visit: Payer: Self-pay | Admitting: Family Medicine

## 2023-08-08 ENCOUNTER — Other Ambulatory Visit: Payer: Self-pay

## 2023-08-08 DIAGNOSIS — F908 Attention-deficit hyperactivity disorder, other type: Secondary | ICD-10-CM

## 2023-08-08 MED ORDER — METHYLPHENIDATE HCL 10 MG PO TABS
10.0000 mg | ORAL_TABLET | Freq: Three times a day (TID) | ORAL | 0 refills | Status: DC
  Filled 2023-08-08: qty 90, 30d supply, fill #0

## 2023-08-08 NOTE — Telephone Encounter (Signed)
LR  07/11/23, #90, 0 rf LOV  07/28/23 FOV  10/29/22  Please review and advise.  Thanks. Dm/cma

## 2023-08-11 ENCOUNTER — Other Ambulatory Visit (HOSPITAL_BASED_OUTPATIENT_CLINIC_OR_DEPARTMENT_OTHER): Payer: Self-pay

## 2023-08-21 ENCOUNTER — Other Ambulatory Visit (HOSPITAL_BASED_OUTPATIENT_CLINIC_OR_DEPARTMENT_OTHER): Payer: Self-pay

## 2023-08-21 ENCOUNTER — Other Ambulatory Visit: Payer: Self-pay

## 2023-08-21 ENCOUNTER — Other Ambulatory Visit: Payer: Self-pay | Admitting: Allergy and Immunology

## 2023-08-21 MED ORDER — IPRATROPIUM BROMIDE 0.06 % NA SOLN
NASAL | 5 refills | Status: DC
  Filled 2023-08-21: qty 15, 30d supply, fill #0
  Filled 2023-10-02: qty 15, 30d supply, fill #1
  Filled 2023-12-18: qty 15, 30d supply, fill #2
  Filled 2024-01-19: qty 15, 30d supply, fill #3
  Filled 2024-02-26: qty 15, 30d supply, fill #4
  Filled 2024-03-27: qty 15, 30d supply, fill #5

## 2023-09-04 ENCOUNTER — Other Ambulatory Visit (HOSPITAL_BASED_OUTPATIENT_CLINIC_OR_DEPARTMENT_OTHER): Payer: Self-pay

## 2023-09-07 ENCOUNTER — Other Ambulatory Visit: Payer: Self-pay | Admitting: Family Medicine

## 2023-09-07 ENCOUNTER — Other Ambulatory Visit: Payer: Self-pay

## 2023-09-07 ENCOUNTER — Other Ambulatory Visit (HOSPITAL_BASED_OUTPATIENT_CLINIC_OR_DEPARTMENT_OTHER): Payer: Self-pay

## 2023-09-07 DIAGNOSIS — G43709 Chronic migraine without aura, not intractable, without status migrainosus: Secondary | ICD-10-CM

## 2023-09-07 DIAGNOSIS — F908 Attention-deficit hyperactivity disorder, other type: Secondary | ICD-10-CM

## 2023-09-07 MED ORDER — METHYLPHENIDATE HCL 10 MG PO TABS
10.0000 mg | ORAL_TABLET | Freq: Three times a day (TID) | ORAL | 0 refills | Status: DC
  Filled 2023-09-07 – 2023-09-08 (×2): qty 90, 30d supply, fill #0

## 2023-09-07 MED ORDER — SUMATRIPTAN SUCCINATE 50 MG PO TABS
ORAL_TABLET | ORAL | 3 refills | Status: DC
  Filled 2023-09-07: qty 10, 30d supply, fill #0
  Filled 2023-10-12: qty 10, 30d supply, fill #1

## 2023-09-08 ENCOUNTER — Other Ambulatory Visit (HOSPITAL_BASED_OUTPATIENT_CLINIC_OR_DEPARTMENT_OTHER): Payer: Self-pay

## 2023-09-08 MED ORDER — ATENOLOL 25 MG PO TABS
12.5000 mg | ORAL_TABLET | Freq: Every day | ORAL | 0 refills | Status: DC
  Filled 2023-09-08: qty 30, 60d supply, fill #0

## 2023-09-08 NOTE — Telephone Encounter (Signed)
Refill request for  Atenolol  25 mg LR   Hx provider LOV  07/28/23 FOV   10/30/23  Please review and advise.  Thanks Dm/cma

## 2023-10-02 ENCOUNTER — Other Ambulatory Visit: Payer: Self-pay | Admitting: Allergy and Immunology

## 2023-10-02 ENCOUNTER — Other Ambulatory Visit: Payer: Self-pay

## 2023-10-02 ENCOUNTER — Other Ambulatory Visit (HOSPITAL_BASED_OUTPATIENT_CLINIC_OR_DEPARTMENT_OTHER): Payer: Self-pay

## 2023-10-02 MED ORDER — AZELASTINE HCL 0.1 % NA SOLN
NASAL | 5 refills | Status: DC
  Filled 2023-10-02: qty 30, 25d supply, fill #0
  Filled 2024-01-03: qty 30, 25d supply, fill #1
  Filled 2024-01-29: qty 30, 25d supply, fill #2
  Filled 2024-02-26: qty 30, 25d supply, fill #3
  Filled 2024-03-21: qty 30, 25d supply, fill #4
  Filled 2024-04-16: qty 30, 25d supply, fill #5

## 2023-10-12 ENCOUNTER — Other Ambulatory Visit: Payer: Self-pay | Admitting: Family

## 2023-10-12 DIAGNOSIS — F908 Attention-deficit hyperactivity disorder, other type: Secondary | ICD-10-CM

## 2023-10-13 ENCOUNTER — Other Ambulatory Visit: Payer: Self-pay

## 2023-10-17 ENCOUNTER — Ambulatory Visit: Payer: No Typology Code available for payment source | Admitting: Allergy and Immunology

## 2023-10-17 ENCOUNTER — Other Ambulatory Visit (HOSPITAL_BASED_OUTPATIENT_CLINIC_OR_DEPARTMENT_OTHER): Payer: Self-pay

## 2023-10-17 VITALS — BP 132/78 | HR 79 | Temp 98.0°F | Resp 16 | Ht 62.21 in | Wt 163.4 lb

## 2023-10-17 DIAGNOSIS — K219 Gastro-esophageal reflux disease without esophagitis: Secondary | ICD-10-CM | POA: Diagnosis not present

## 2023-10-17 DIAGNOSIS — J4520 Mild intermittent asthma, uncomplicated: Secondary | ICD-10-CM | POA: Diagnosis not present

## 2023-10-17 DIAGNOSIS — R232 Flushing: Secondary | ICD-10-CM

## 2023-10-17 DIAGNOSIS — J383 Other diseases of vocal cords: Secondary | ICD-10-CM

## 2023-10-17 DIAGNOSIS — J3089 Other allergic rhinitis: Secondary | ICD-10-CM | POA: Diagnosis not present

## 2023-10-17 DIAGNOSIS — L501 Idiopathic urticaria: Secondary | ICD-10-CM

## 2023-10-17 MED ORDER — ARNUITY ELLIPTA 100 MCG/ACT IN AEPB
2.0000 | INHALATION_SPRAY | RESPIRATORY_TRACT | 3 refills | Status: DC | PRN
  Filled 2023-10-17: qty 30, 30d supply, fill #0

## 2023-10-17 NOTE — Patient Instructions (Addendum)
 00 1. Continue the following if needed:   A. Ipratropium 0.06% - 2 sprays each nostril 1-2 times per day  B. Azelastine  - 2 sprays each nostril 1-2 times per day  C. Mucinex  D. Gaviscon   E. Pepcid  F. Albuterol  + Pulmicort  160 - 2 inhalations each every 4-6 hours   G. Auvi-Q  0.3  2. Return to clinic in 12 months or earlier if needed

## 2023-10-17 NOTE — Progress Notes (Signed)
 [place] [facility] [place] [facility] [place] [facility] [place] - [place]   Follow-up Note  Referring Provider: Thedora Garnette HERO, MD Primary Provider: Thedora Garnette HERO, MD Date of Office Visit: 10/17/2023  Subjective:   Samantha [name] (DOB: Dec 19, 1970) is a 53 y.o. female who [facility] in re-evaluation of the following:  HPI: Eyleen returns to this clinic in evaluation of asthma, allergic rhinitis, LPR, VCD, history of urticaria/flushing disorder.  I last saw her in this clinic 18 April 2023.  She has done well with her airway while intermittently using nasal ipratropium and azelastine .  It does not sound as though she has required a systemic steroid or antibiotic for any type of airway issue.  Rarely does she need to use albuterol .  She did contract COVID and had both GI and respiratory issues and a fair amount of systemic symptoms then did not receive any Paxlovid and apparently required 4 months or so for recovery.  Fortunately there does not appear to be any long-term sequela as a result of that issue.  She has been doing okay regarding her reflux while intermittently using some Pepcid or Gaviscon.  She has not been having a tremendous amount of flushing or urticaria.  She uses Pepcid and some Benadryl   usually averaging out about 3 times a month. , She has obtained this years flu vaccine.  She had a cystoscopy apparently for her hematuria and also had a urethral dilation but the cause of her hematuria was never identified.  Allergies as of 10/17/2023       Reactions   Acetaminophen  Rash   Blistering lymph nodes, and blistering rash    Airduo Digihaler  [fluticasone -salmeterol] Hives, Shortness Of Breath, Itching   Asthma attack   Allegra  [fexofenadine ] Cough   She reports tachycardia and elevated BP and low Oxygen level after taking this   Aspirin Shortness Of Breath   High dose brings asthma attack    B-12 [cyanocobalamin ] Shortness  Of Breath, Itching   Drop O2   Baclofen  Other (See Comments)   Headache, muscle weakness, unable to urinate, unable to walk    Bactrim Anaphylaxis   Bee Venom Hives, Itching, Other (See Comments), Rash   Benadryl  [diphenhydramine ] Anaphylaxis   Only the liquid form she can not take because it causes throat closing,  DO NOT USE IN IV FORM - tablets are ok   Ciprofloxacin Shortness Of Breath   Blurred Vision, joint pain, headaches, dropped BP   Clindamycin Shortness Of Breath   Coconut (cocos Nucifera) Hives, Itching, Other (See Comments)   Coconut Oil    Coq-10 [coenzyme Q10] Hives, Shortness Of Breath, Itching   Cymbalta [duloxetine Hcl] Anaphylaxis, Shortness Of Breath   Darvon [propoxyphene] Hives, Shortness Of Breath, Itching   Darvocet*   Flunisolide  Shortness Of Breath, Other (See Comments)   Aerospan  Nasal Inhaler* - caused blood clots in nose    Formaldehyde Anaphylaxis   Hydrocodone-acetaminophen  Hives, Shortness Of Breath, Itching   Vicodin, Norco   Nucynta [tapentadol] Shortness Of Breath   Coughing, SOB    Other Hives, Itching, Rash, Other (See Comments)   Insect stings/ Bites  Wine spirit Peanuts - Bring on Asthma attack  Cold Temperatures  - causes hives, and muscle spasms    Peanut-containing Drug Products Hives, Shortness Of Breath, Itching, Swelling, Other (See Comments)   Potassium-containing Compounds Anaphylaxis, Itching, Swelling, Palpitations   Tables, and Oral Solution    Ramipril Anaphylaxis   Skelaxin  [metaxalone ]  Hives, Shortness Of Breath, Itching   Brings on Asthma Attacks    Sulfa Antibiotics Anaphylaxis   Sulfamethoxazole-trimethoprim Anaphylaxis   Tezspire  [tezepelumab ] Shortness Of Breath, Nausea And Vomiting, Swelling, Rash, Other (See Comments)   Muscle weakness, joint pain, sore throat   Triamcinolone  Other (See Comments)   Kenalog  Injection - muscle weakness, spasms, BP drop, lethargic, tongue swelling   Nasacort  causes breathing issues     Voltaren [diclofenac] Shortness Of Breath, Swelling, Rash   Gel*    Zofran  [ondansetron  Hcl] Hives, Itching   Blurred Vision    Aspartame Other (See Comments)   Severe Migraines   Bacitra-neomycin-polymyxin-hc Rash   Cranberry Extract Other (See Comments)   Throat itching / burning    Sulfur Hives, Itching   Aciphex  [rabeprazole ]    Muscle weakness, headaches    Addyi [flibanserin]    Decreased sex drive to 0   Alvesco  [ciclesonide ] Other (See Comments)   Sores in mouth, coughing    Ambien [zolpidem]    Mood Change, Aggressive, Insomnia    Amoxicillin     Pt unsure of Allergy    Ativan [lorazepam] Other (See Comments)   Hallucination   Aygestin [norethindrone]    Muscle weakness, abdominal pain   Celebrex [celecoxib] Other (See Comments)   Possible Reaction - Not taken    Demeclocycline    Tetracycline Family-  GI Upset   Dexilant [dexlansoprazole]    Severe joint pain    Doxycycline    Possibly joint pain or itch   Effexor Xr [venlafaxine Hcl Er]    Migraines, weight gain, trouble sleeping   Elavil [amitriptyline] Nausea And Vomiting   Headache, and muscle weakness   Erythromycin    Upset stomach   Erythromycin Base    Stomach Upset    Fentanyl  Dermatitis   Patches*    Fioricet [butalbital-apap-caffeine]    Acetaminophen     Hydrocodone Itching   Hydrocodone Bit-homatrop Mbr Hives, Itching   Not true allergy. irritated   Hydromorphone  Nausea Only   Inderal [propranolol]    Agitation, difficulty sleeping    Indomethacin Hives, Itching   Latex Itching   Skin Peeling    Levofloxacin    Weakness, myalgia   Lyrica [pregabalin]    Muscle weakness, Causes Falls    Maxalt [rizatriptan] Other (See Comments)   Pt was knocked out from med, extreme drowsiness and did not help migraine    Meloxicam Itching   Increases BP   Morphine  Sulfate Er    Severe insomnia, does not help pain    Naproxen Hives, Itching   Head to Toe    Neurontin [gabapentin]    Depression     Nexium [esomeprazole]    Bad headaches    Nitrofuran Derivatives    Nortriptyline    Depression, decreased sex drive    Omeprazole-sodium Bicarbonate    Chest pain-sore all over-HA   Ranitidine    Severe joint pain   Robaxin  [methocarbamol ]    Made her very tired and unable to sleep at night Muscle weakness, fatigue    Tape Dermatitis   Tetanus Toxoid Other (See Comments)   Tetanus injection - blistering rash, swelling at injection site, turn into baseball size lump, joint pain    Tetanus-diphtheria Toxoids Td Hives   Robust local reaction   Tetracyclines & Related    GI Upset    Tizanidine Other (See Comments)   knocked her out- had a rebound migraine, causes muscles to lock, and bedridden    Topamax [topiramate]  Lethargic, daytime drowsiness, insomnia    Trazodone    Pt thinks it did not help - unsure of reaction    Tums [calcium  Carbonate] Other (See Comments)   Blistering Rash with high doses    Wound Dressing Adhesive Itching, Dermatitis   Bandages    Etodolac  Rash   Blistering Rash, and swollen lymph nodes    Flexeril  [cyclobenzaprine ] Palpitations   Irregular heartbeat    Ibuprofen Itching, Hives, Rash, Swelling   Throat itching   Neosporin [neomycin-bacitracin Zn-polymyx] Rash   Blistering         Medication List    albuterol  108 (90 Base) MCG/ACT inhaler Commonly known as: VENTOLIN  HFA INHALE 2 PUFFS BY MOUTH EVERY 4 HOURS AS NEEDED FOR WHEEZING FOR SHORTNESS OF BREATH   ALPRAZolam  1 MG tablet Commonly known as: XANAX  Take 1 tablet (1 mg total) by mouth 2 (two) times daily as needed.   Arnuity Ellipta  100 MCG/ACT Aepb Generic drug: Fluticasone  Furoate Inhale 2 puffs into the lungs every 4 (four) hours as needed. Started by: Stephinie Battisti J Terique Kawabata   aspirin EC 81 MG tablet Take 81 mg by mouth daily. Swallow whole.   atenolol  25 MG tablet Commonly known as: TENORMIN  Take 0.5 tablets (12.5 mg total) by mouth daily.   Auvi-Q  0.3 MG/0.3ML Soaj  injection Generic drug: EPINEPHrine  Inject 0.3 mg into the muscle as needed for anaphylaxis. As directed for life-threatening allergic reactions   Azelastine  HCl 137 MCG/SPRAY Soln Place 2 sprays into each nostril 1-2 times per day.   benzonatate  100 MG capsule Commonly known as: TESSALON  Take 1 capsule (100 mg total) by mouth 3 (three) times daily as needed for cough.   budesonide  0.25 MG/2ML nebulizer solution Commonly known as: PULMICORT  Take 0.25 mg by nebulization 2 (two) times daily as needed (Asthma).   cimetidine 200 MG tablet Commonly known as: TAGAMET Take 200 mg by mouth 2 (two) times daily.   diazepam  10 MG tablet Commonly known as: VALIUM  Take 1 tablet (10 mg total) by mouth every 6 (six) hours as needed for anxiety or sleep (vertigo/muscle spasms).   diphenhydrAMINE  12.5 MG chewable tablet Commonly known as: BENADRYL  Chew 25 mg by mouth 4 (four) times daily as needed for allergies.   estradiol  0.1 MG/GM vaginal cream Commonly known as: ESTRACE  Place 1 gram vaginally 2 (two) times a week at bedtime for 90 days.   fluconazole  150 MG tablet Commonly known as: DIFLUCAN  Take 1 tablet (150 mg total) by mouth every 3 (three) days for 6 days.   Gaviscon 80-14.2 MG Chew Generic drug: Alum Hydroxide-Mag Trisilicate Chew 2 tablets by mouth as needed (acid reflux).   guaifenesin 400 MG Tabs tablet Commonly known as: HUMIBID E Take 400-800 mg by mouth every 4 (four) hours as needed (Cough/mucus).   ipratropium 0.06 % nasal spray Commonly known as: ATROVENT  Place 2 sprays into each nostril 1-2 times per day.   levalbuterol  45 MCG/ACT inhaler Commonly known as: XOPENEX  HFA Inhale 2 puffs into the lungs every 6 (six) hours as needed for wheezing.   magnesium oxide 400 (240 Mg) MG tablet Commonly known as: MAG-OX Take 400 mg by mouth daily.   methylphenidate  10 MG tablet Commonly known as: Ritalin  Take 1 tablet (10 mg total) by mouth 3 (three) times daily with  meals.   metroNIDAZOLE  500 MG tablet Commonly known as: FLAGYL  Take 1 tablet (500 mg total) by mouth every 12 (twelve) hours for 7 days.   Pataday 0.2 % Soln Generic  drug: Olopatadine HCl Place 1 drop into both eyes 2 (two) times daily as needed (eye allergies).   Pepcid AC Maximum Strength 20 MG tablet Generic drug: famotidine Take 20 mg by mouth daily as needed (Hives/itching).   rosuvastatin  10 MG tablet Commonly known as: CRESTOR  Take 1 tablet by mouth once daily   SUMAtriptan  50 MG tablet Commonly known as: IMITREX  Take 1 tablet (50 mg total) by mouth once as needed. May repeat in 2 hours if headache persists or recurs.   Vitamin D  50 MCG (2000 UT) Caps Take 2,000 Units by mouth daily.    Past Medical History:  Diagnosis Date   ADD (attention deficit disorder)    Allergy    Anaphylaxis    Anemia    Anxiety    Arthritis    Asthma    Chronic back pain    Chronic kidney disease    Chronic neck pain    Complication of anesthesia    Waking up before procedure was over.   Degenerative disc disease at L5-S1 level    Dysrhythmia    Had  abnormal beats d/t B/P med she was on. B/P med reduced.   Endometriosis    Familial cold urticaria    Family history of adverse reaction to anesthesia    Fibromyalgia    GERD (gastroesophageal reflux disease)    Headache    Migraines   Hernia    Hyperlipidemia    Hypersomnia    Hypertension    IBS (irritable bowel syndrome)    IC (interstitial cystitis)    IgG Gliadin antibody positive    Low blood potassium    Migraines    Multiple allergies    OCD (obsessive compulsive disorder)    Raynaud disease    Raynaud's disease    Umbilical hernia    Vertigo    Vertigo     Past Surgical History:  Procedure Laterality Date   BLADDER SURGERY     age 56- 58   CERVICAL FUSION     08/1998, 03/10/2017   CESAREAN SECTION     COLONOSCOPY     COMBINED HYSTEROSCOPY DIAGNOSTIC / D&C     CYSTO WITH HYDRODISTENSION N/A 02/22/2023    Procedure: HYDRODISTENSION;  Surgeon: Lovie Arlyss CROME, MD;  Location: [facility];  Service: Urology;  Laterality: N/A;   CYSTOSCOPY W/ RETROGRADES N/A 02/22/2023   Procedure: CYSTOSCOPY WITH BILATERAL RETROGRADE PYELOGRAM;  Surgeon: Lovie Arlyss CROME, MD;  Location: [facility];  Service: Urology;  Laterality: N/A;  60 MINUTES NEEDED FOR CASE   CYSTOSCOPY WITH URETHRAL DILATATION N/A 02/22/2023   Procedure: URETHRAL DILATATION;  Surgeon: Lovie Arlyss CROME, MD;  Location: [facility];  Service: Urology;  Laterality: N/A;   HAND SURGERY     2012, 2013   LAPAROSCOPIC ENDOMETRIOSIS FULGURATION Right    LESION REMOVAL  08/23/2012   Procedure: LESION REMOVAL HAND;  Surgeon: Lamar LULLA Leonor Mickey., MD;  Location:  SUR[facility]ERY CENTER;  Service: Orthopedics;  Laterality: Left;   ROTATOR CUFF REPAIR     UPPER GASTROINTESTINAL ENDOSCOPY      Review of systems negative except as noted in HPI / PMHx or noted below:  Review of Systems  Constitutional: Negative.   HENT: Negative.    Eyes: Negative.   Respiratory: Negative.    Cardiovascular: Negative.   Gastrointestinal: Negative.   Genitourinary: Negative.   Musculoskeletal: Negative.   Skin: Negative.   Neurological: Negative.   Endo/Heme/Allergies: Negative.   Psychiatric/Behavioral: Negative.  Objective:   Vitals:   10/17/23 1150  BP: 132/78  Pulse: 79  Resp: 16  Temp: 98 F (36.7 C)  SpO2: 95%   Height: 5' 2.21 (158 cm)  Weight: 163 lb 6.4 oz (74.1 kg)   Physical Exam Constitutional:      Appearance: She is not diaphoretic.  HENT:     Head: Normocephalic.     Right Ear: Tympanic membrane, ear canal and external ear normal.     Left Ear: Tympanic membrane, ear canal and external ear normal.     Nose: Nose normal. No mucosal edema or rhinorrhea.     Mouth/Throat:     Pharynx: Uvula midline. No oropharyngeal exudate.  Eyes:     Conjunctiva/sclera: Conjunctivae normal.  Neck:     Thyroid: No thyromegaly.     Trachea: Trachea  normal. No tracheal tenderness or tracheal deviation.  Cardiovascular:     Rate and Rhythm: Normal rate and regular rhythm.     Heart sounds: Normal heart sounds, S1 normal and S2 normal. No murmur heard. Pulmonary:     Effort: No respiratory distress.     Breath sounds: Normal breath sounds. No stridor. No wheezing or rales.  Lymphadenopathy:     Head:     Right side of head: No tonsillar adenopathy.     Left side of head: No tonsillar adenopathy.     Cervical: No cervical adenopathy.  Skin:    Findings: No erythema or rash.     Nails: There is no clubbing.  Neurological:     Mental Status: She is alert.     Diagnostics: none  Assessment and Plan:   1. Asthma, mild intermittent, well-controlled   2. Vocal cord dysfunction   3. LPRD (laryngopharyngeal reflux disease)   4. Other allergic rhinitis   5. Flushing   6. Chronic idiopathic urticaria    1. Continue the following if needed:   A. Ipratropium 0.06% - 2 sprays each nostril 1-2 times per day  B. Azelastine  - 2 sprays each nostril 1-2 times per day  C. Mucinex  D. Gaviscon   E. Pepcid  F. Albuterol  + Pulmicort  160 - 2 inhalations each every 4-6 hours   G. Auvi-Q  0.3  2. Return to clinic in 12 months or earlier if needed  Janise appears to be doing pretty well while intermittently using a collection of agents directed at immune activation and reflux and were not really going to change her therapy very much at this point in time.  Certainly if she loses control of her issues as she moves forward with as needed use of medication she can contact us  for further evaluation and treatment but otherwise we will just see her back in this clinic in 1 year or earlier if there is a problem.  Camellia Denis, MD Allergy / Immunology [facility]

## 2023-10-18 ENCOUNTER — Encounter: Payer: Self-pay | Admitting: Allergy and Immunology

## 2023-10-20 ENCOUNTER — Other Ambulatory Visit: Payer: Self-pay | Admitting: Family

## 2023-10-20 ENCOUNTER — Other Ambulatory Visit (HOSPITAL_BASED_OUTPATIENT_CLINIC_OR_DEPARTMENT_OTHER): Payer: Self-pay

## 2023-10-20 DIAGNOSIS — F908 Attention-deficit hyperactivity disorder, other type: Secondary | ICD-10-CM

## 2023-10-25 ENCOUNTER — Other Ambulatory Visit (HOSPITAL_BASED_OUTPATIENT_CLINIC_OR_DEPARTMENT_OTHER): Payer: Self-pay

## 2023-10-25 ENCOUNTER — Encounter: Payer: Self-pay | Admitting: Allergy and Immunology

## 2023-10-25 ENCOUNTER — Other Ambulatory Visit: Payer: Self-pay | Admitting: *Deleted

## 2023-10-25 MED ORDER — AIRSUPRA 90-80 MCG/ACT IN AERO
2.0000 | INHALATION_SPRAY | Freq: Four times a day (QID) | RESPIRATORY_TRACT | 1 refills | Status: DC | PRN
  Filled 2023-10-25: qty 10.7, 15d supply, fill #0
  Filled 2023-11-25: qty 10.7, 15d supply, fill #1

## 2023-10-30 ENCOUNTER — Other Ambulatory Visit (HOSPITAL_BASED_OUTPATIENT_CLINIC_OR_DEPARTMENT_OTHER): Payer: Self-pay

## 2023-10-30 ENCOUNTER — Ambulatory Visit: Payer: No Typology Code available for payment source | Admitting: Family Medicine

## 2023-10-30 ENCOUNTER — Other Ambulatory Visit (HOSPITAL_COMMUNITY): Payer: Self-pay

## 2023-10-30 VITALS — BP 124/68 | HR 79 | Temp 98.1°F | Ht 62.0 in | Wt 165.8 lb

## 2023-10-30 DIAGNOSIS — F908 Attention-deficit hyperactivity disorder, other type: Secondary | ICD-10-CM

## 2023-10-30 DIAGNOSIS — E785 Hyperlipidemia, unspecified: Secondary | ICD-10-CM

## 2023-10-30 DIAGNOSIS — F4001 Agoraphobia with panic disorder: Secondary | ICD-10-CM

## 2023-10-30 DIAGNOSIS — G43709 Chronic migraine without aura, not intractable, without status migrainosus: Secondary | ICD-10-CM

## 2023-10-30 DIAGNOSIS — F411 Generalized anxiety disorder: Secondary | ICD-10-CM

## 2023-10-30 DIAGNOSIS — I10 Essential (primary) hypertension: Secondary | ICD-10-CM

## 2023-10-30 MED ORDER — ATENOLOL 25 MG PO TABS
12.5000 mg | ORAL_TABLET | Freq: Every day | ORAL | 0 refills | Status: DC
  Filled 2023-10-30: qty 30, 60d supply, fill #0

## 2023-10-30 MED ORDER — ROSUVASTATIN CALCIUM 10 MG PO TABS
10.0000 mg | ORAL_TABLET | Freq: Every day | ORAL | 3 refills | Status: DC
  Filled 2023-10-30 – 2023-12-18 (×2): qty 90, 90d supply, fill #0

## 2023-10-30 MED ORDER — SUMATRIPTAN SUCCINATE 50 MG PO TABS
ORAL_TABLET | ORAL | 3 refills | Status: DC
  Filled 2023-10-30 – 2023-11-25 (×2): qty 10, 30d supply, fill #0
  Filled 2023-12-25: qty 10, 30d supply, fill #1
  Filled 2024-01-29: qty 10, 30d supply, fill #2
  Filled 2024-02-27: qty 10, 30d supply, fill #3

## 2023-10-30 MED ORDER — DIAZEPAM 10 MG PO TABS
10.0000 mg | ORAL_TABLET | Freq: Four times a day (QID) | ORAL | 2 refills | Status: DC | PRN
  Filled 2023-10-30: qty 30, 8d supply, fill #0
  Filled 2023-11-25: qty 30, 8d supply, fill #1
  Filled 2023-12-18: qty 30, 8d supply, fill #2

## 2023-10-30 MED ORDER — ALPRAZOLAM 1 MG PO TABS
1.0000 mg | ORAL_TABLET | Freq: Two times a day (BID) | ORAL | 2 refills | Status: DC | PRN
  Filled 2023-10-30: qty 20, 10d supply, fill #0
  Filled 2023-11-25: qty 20, 10d supply, fill #1
  Filled 2023-12-18: qty 20, 10d supply, fill #2

## 2023-10-30 MED ORDER — METHYLPHENIDATE HCL 10 MG PO TABS
10.0000 mg | ORAL_TABLET | Freq: Three times a day (TID) | ORAL | 0 refills | Status: DC
  Filled 2023-10-30: qty 90, 30d supply, fill #0

## 2023-10-30 NOTE — Assessment & Plan Note (Signed)
Stable. Overall improved control of anxiety. Continue diazepam for baseline control and alprazolam as needed for panic attacks.

## 2023-10-30 NOTE — Assessment & Plan Note (Signed)
Stable. Continue methylphenidate 10 mg TID.

## 2023-10-30 NOTE — Assessment & Plan Note (Signed)
Stable. Continue use of alprazolam for breakthrough panic attacks.

## 2023-10-30 NOTE — Assessment & Plan Note (Addendum)
Blood pressure is at goal. Continue atenolol 25 mg 1/2 tab daily.

## 2023-10-30 NOTE — Assessment & Plan Note (Signed)
Stable. Continue sumatriptan (Imitrex) for breakthrough.

## 2023-10-30 NOTE — Assessment & Plan Note (Signed)
I will continue rosuvastatin 10 mg daily.

## 2023-10-30 NOTE — Progress Notes (Signed)
[facility]IMARY [facility]ARE [facility]IMARY Trecia Rogers [facility] [address] [place] [address] Dept: [phone] Dept Fax: [phone]  Chronic Care Office Visit  Subjective:    Patient ID: Samantha [name], female    DOB: [birth year], 53 y.o..   MRN: [id number]  Chief Complaint  Patient presents with   Hypertension    3 month f/u HTN.  C/o having sleep issues.      History of Present Illness:  Patient is in today for reassessment of chronic medical issues.  Ms. Samantha [name] has a history of hypertension and is managed on atenolol 25 mg 1/2 tab daily.   Ms. Samantha [name] has a history of asthma and allergy symptoms. She is followed by Dr. Darcey Nora (allergist).  She notes that currently, her symptoms continue to be in better control. She is using some azelastine spray along with her Atrovent spray to control rhinorrhea. She is not having any current wheezing and has not had significant respiratory illness this winter.   Ms. Samantha [name] has multiple psychiatric issues including anxiety, PTSD, panic disorder, and ADHD. She is managed on Valium for baseline control of her anxiety and management of insomnia, and Xanax for breakthrough panic issues or acute anxiety. She is on methylphenidate TID for management of her ADHD and daytime hypersomnia. She spent time describing significant dysfunctional family issues related to her relationship with her mother and with her now deceased father. She describes efforts to set boundaries on appropriate behaviors by her mother.   Past Medical History: Patient Active Problem List   Diagnosis Date Noted   COVID-19 long hauler 06/26/2023   Gross hematuria 04/27/2023   Renal mass 04/24/2023   Cervical spine arthritis 02/25/2022   Leg swelling 10/19/2021   Interstitial cystitis 09/15/2021   Other urethral stricture, female 09/15/2021   Chronic kidney disease, stage 3a (HCC) 08/20/2021   Prediabetes 08/27/2020   History of [place] spotted fever  02/04/2020   Moderate persistent asthma without complication 08/02/2019   LPRD (laryngopharyngeal reflux disease) 08/02/2019   Chronic rhinitis 08/02/2019   Chronic post-traumatic stress disorder (PTSD) 11/21/2018   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) 11/21/2018   Pulmonary nodules 11/13/2018   Other microscopic hematuria 11/06/2018   Generalized anxiety disorder 07/04/2018   Fecal incontinence 07/02/2018   Leg weakness 06/12/2017   Left ear hearing loss 02/07/2017   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   Borderline hyperlipidemia 11/08/2016   Acute idiopathic gout of multiple sites 11/08/2016   Elbow pain, chronic, right 10/06/2016   Lateral epicondylitis, right elbow 10/06/2016   Fibromyalgia 10/04/2016   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   Migraine headache 07/29/2016   Irritable bowel syndrome with both constipation and diarrhea 07/29/2016   Hives 07/18/2016   Urinary incontinence 06/02/2016   Atherosclerosis of abdominal aorta (HCC) 09/03/2015   Carpal tunnel syndrome 11/19/2014   Primary hypersomnia 11/18/2014   Insomnia 11/18/2014   Lumbar radicular pain 11/18/2014   Chronic pain syndrome 11/18/2014   Cervical disc disorder with radiculopathy 11/18/2014   Chronic female pelvic pain 07/31/2014   Degeneration of intervertebral disc of lumbosacral region 07/31/2014   Rosacea 07/31/2014   Morton's neuroma 07/31/2014   Benign paroxysmal positional vertigo 07/31/2014   Essential hypertension 07/31/2014   History of methicillin resistant Staphylococcus aureus infection 07/09/2014   Past Surgical History:  Procedure Laterality Date   BLADDER SURGERY     age 77- 53   CERVICAL FUSION     08/1998, 03/10/2017   CESAREAN SECTION  COLONOSCOPY     COMBINED HYSTEROSCOPY DIAGNOSTIC / D&C     CYSTO WITH HYDRODISTENSION N/A 02/22/2023   Procedure: HYDRODISTENSION;  Surgeon: Despina Arias, MD;  Location: [facility];   Service: Urology;  Laterality: N/A;   CYSTOSCOPY W/ RETROGRADES N/A 02/22/2023   Procedure: CYSTOSCOPY WITH BILATERAL RETROGRADE PYELOGRAM;  Surgeon: Despina Arias, MD;  Location: [facility];  Service: Urology;  Laterality: N/A;  60 MINUTES NEEDED FOR CASE   CYSTOSCOPY WITH URETHRAL DILATATION N/A 02/22/2023   Procedure: URETHRAL DILATATION;  Surgeon: Despina Arias, MD;  Location: [facility];  Service: Urology;  Laterality: N/A;   HAND SURGERY     2012, 2013   LAPAROSCOPIC ENDOMETRIOSIS FULGURATION Right    LESION REMOVAL  08/23/2012   Procedure: LESION REMOVAL HAND;  Surgeon: Wyn Forster., MD;  Location: [facility];  Service: Orthopedics;  Laterality: Left;   ROTATOR CUFF REPAIR     UPPER GASTROINTESTINAL ENDOSCOPY     Family History  Problem Relation Age of Onset   Cancer Father        Multiple myeloma   ADD / ADHD Brother    Stomach cancer Maternal Grandfather    Colon cancer Neg Hx    Esophageal cancer Neg Hx    Rectal cancer Neg Hx    Outpatient Medications Prior to Visit  Medication Sig Dispense Refill   AIRSUPRA 90-80 MCG/ACT AERO Inhale 2 puffs into the lungs every 6 (six) hours as needed. 10.7 g 1   albuterol (VENTOLIN HFA) 108 (90 Base) MCG/ACT inhaler INHALE 2 PUFFS BY MOUTH EVERY 4 HOURS AS NEEDED FOR WHEEZING FOR SHORTNESS OF BREATH 18 g 0   Alum Hydroxide-Mag Trisilicate (GAVISCON) 80-14.2 MG CHEW Chew 2 tablets by mouth as needed (acid reflux).     aspirin EC 81 MG tablet Take 81 mg by mouth daily. Swallow whole.     AUVI-Q 0.3 MG/0.3ML SOAJ injection Inject 0.3 mg into the muscle as needed for anaphylaxis. As directed for life-threatening allergic reactions 2 each 1   azelastine (ASTELIN) 0.1 % nasal spray Place 2 sprays into each nostril 1-2 times per day. 30 mL 5   benzonatate (TESSALON) 100 MG capsule Take 1 capsule (100 mg total) by mouth 3 (three) times daily as needed for cough. 90 capsule 1   budesonide (PULMICORT) 0.25 MG/2ML nebulizer  solution Take 0.25 mg by nebulization 2 (two) times daily as needed (Asthma).     Cholecalciferol (VITAMIN D) 50 MCG (2000 UT) CAPS Take 2,000 Units by mouth daily.     cimetidine (TAGAMET) 200 MG tablet Take 200 mg by mouth 2 (two) times daily.     diphenhydrAMINE (BENADRYL) 12.5 MG chewable tablet Chew 25 mg by mouth 4 (four) times daily as needed for allergies.     EPINEPHrine (PRIMATENE MIST) 0.125 MG/ACT AERO Inhale 1 puff into the lungs as needed (Asthma).     estradiol (ESTRACE) 0.1 MG/GM vaginal cream Place 1 gram vaginally 2 (two) times a week at bedtime for 90 days. 42.5 g 3   famotidine (PEPCID AC MAXIMUM STRENGTH) 20 MG tablet Take 20 mg by mouth daily as needed (Hives/itching).     guaifenesin (HUMIBID E) 400 MG TABS tablet Take 400-800 mg by mouth every 4 (four) hours as needed (Cough/mucus).     ipratropium (ATROVENT) 0.06 % nasal spray Place 2 sprays into each nostril 1-2 times per day. 15 mL 5   levalbuterol (XOPENEX HFA) 45 MCG/ACT inhaler Inhale 2 puffs  into the lungs every 6 (six) hours as needed for wheezing. 15 g 1   magnesium oxide (MAG-OX) 400 (240 Mg) MG tablet Take 400 mg by mouth daily.     Olopatadine HCl (PATADAY) 0.2 % SOLN Place 1 drop into both eyes 2 (two) times daily as needed (eye allergies).     ALPRAZolam (XANAX) 1 MG tablet Take 1 tablet (1 mg total) by mouth 2 (two) times daily as needed. 20 tablet 2   atenolol (TENORMIN) 25 MG tablet Take 0.5 tablets (12.5 mg total) by mouth daily. 30 tablet 0   diazepam (VALIUM) 10 MG tablet Take 1 tablet (10 mg total) by mouth every 6 (six) hours as needed for anxiety or sleep (vertigo/muscle spasms). 30 tablet 2   Fluticasone Furoate (ARNUITY ELLIPTA) 100 MCG/ACT AEPB Inhale 2 puffs into the lungs every 4 (four) hours as needed. 30 each 3   rosuvastatin (CRESTOR) 10 MG tablet Take 1 tablet by mouth once daily 90 tablet 3   SUMAtriptan (IMITREX) 50 MG tablet Take 1 tablet (50 mg total) by mouth once as needed. May repeat in  2 hours if headache persists or recurs. 10 tablet 3   fluconazole (DIFLUCAN) 150 MG tablet Take 1 tablet (150 mg total) by mouth every 3 (three) days for 6 days. 2 tablet 0   methylphenidate (RITALIN) 10 MG tablet Take 1 tablet (10 mg total) by mouth 3 (three) times daily with meals. 90 tablet 0   metroNIDAZOLE (FLAGYL) 500 MG tablet Take 1 tablet (500 mg total) by mouth every 12 (twelve) hours for 7 days. 14 tablet 0   No facility-administered medications prior to visit.   Allergies  Allergen Reactions   Acetaminophen Rash    Blistering lymph nodes, and blistering rash    Airduo Digihaler [Fluticasone-Salmeterol] Hives, Shortness Of Breath and Itching    Asthma attack   Allegra [Fexofenadine] Cough    She reports tachycardia and elevated BP and low Oxygen level after taking this   Aspirin Shortness Of Breath    High dose brings asthma attack    B-12 [Cyanocobalamin] Shortness Of Breath and Itching    Drop O2   Baclofen Other (See Comments)    Headache, muscle weakness, unable to urinate, unable to walk    Bactrim Anaphylaxis   Bee Venom Hives, Itching, Other (See Comments) and Rash   Benadryl [Diphenhydramine] Anaphylaxis    Only the liquid form she can not take because it causes throat closing,  DO NOT USE IN IV FORM - tablets are ok   Ciprofloxacin Shortness Of Breath    Blurred Vision, joint pain, headaches, dropped BP   Clindamycin Shortness Of Breath   Coconut (Cocos Nucifera) Hives, Itching and Other (See Comments)    Coconut Oil    Coq-10 [Coenzyme Q10] Hives, Shortness Of Breath and Itching   Cymbalta [Duloxetine Hcl] Anaphylaxis and Shortness Of Breath   Darvon [Propoxyphene] Hives, Shortness Of Breath and Itching    Darvocet*   Flunisolide Shortness Of Breath and Other (See Comments)    Aerospan Nasal Inhaler* - caused blood clots in nose    Formaldehyde Anaphylaxis   Hydrocodone-Acetaminophen Hives, Shortness Of Breath and Itching    Vicodin, Norco   Nucynta  [Tapentadol] Shortness Of Breath    Coughing, SOB    Other Hives, Itching, Rash and Other (See Comments)    Insect stings/ Bites  Wine spirit  Peanuts - Bring on Asthma attack   Cold Temperatures  - causes hives, and  muscle spasms    Peanut-Containing Drug Products Hives, Shortness Of Breath, Itching, Swelling and Other (See Comments)   Potassium-Containing Compounds Anaphylaxis, Itching, Swelling and Palpitations    Tables, and Oral Solution    Ramipril Anaphylaxis   Skelaxin [Metaxalone] Hives, Shortness Of Breath and Itching    Brings on Asthma Attacks    Sulfa Antibiotics Anaphylaxis   Sulfamethoxazole-Trimethoprim Anaphylaxis   Tezspire [Tezepelumab] Shortness Of Breath, Nausea And Vomiting, Swelling, Rash and Other (See Comments)    Muscle weakness, joint pain, sore throat   Triamcinolone Other (See Comments)    Kenalog Injection - muscle weakness, spasms, BP drop, lethargic, tongue swelling   Nasacort causes breathing issues    Voltaren [Diclofenac] Shortness Of Breath, Swelling and Rash    Gel*    Zofran [Ondansetron Hcl] Hives and Itching    Blurred Vision    Aspartame Other (See Comments)    Severe Migraines   Bacitra-Neomycin-Polymyxin-Hc Rash   Cranberry Extract Other (See Comments)    Throat itching / burning    Sulfur Hives and Itching   Aciphex [Rabeprazole]     Muscle weakness, headaches    Addyi [Flibanserin]     Decreased sex drive to 0   Alvesco [Ciclesonide] Other (See Comments)    Sores in mouth, coughing    Ambien [Zolpidem]     Mood Change, Aggressive, Insomnia    Amoxicillin     Pt unsure of Allergy    Ativan [Lorazepam] Other (See Comments)    Hallucination     Aygestin [Norethindrone]     Muscle weakness, abdominal pain   Celebrex [Celecoxib] Other (See Comments)    Possible Reaction - Not taken    Demeclocycline     Tetracycline Family-  GI Upset   Dexilant [Dexlansoprazole]     Severe joint pain    Doxycycline     Possibly joint pain  or itch    Effexor Xr [Venlafaxine Hcl Er]     Migraines, weight gain, trouble sleeping   Elavil [Amitriptyline] Nausea And Vomiting    Headache, and muscle weakness   Erythromycin     Upset stomach   Erythromycin Base     Stomach Upset    Fentanyl Dermatitis    Patches*    Fioricet [Butalbital-Apap-Caffeine]     Acetaminophen    Hydrocodone Itching   Hydrocodone Bit-Homatrop Mbr Hives and Itching    Not true allergy. "irritated"   Hydromorphone Nausea Only   Inderal [Propranolol]     Agitation, difficulty sleeping    Indomethacin Hives and Itching   Latex Itching    Skin Peeling    Levofloxacin     Weakness, myalgia   Lyrica [Pregabalin]     Muscle weakness, Causes Falls    Maxalt [Rizatriptan] Other (See Comments)    Pt was knocked out from med, extreme drowsiness and did not help migraine    Meloxicam Itching    Increases BP   Morphine Sulfate Er     Severe insomnia, does not help pain    Naproxen Hives and Itching    Head to Toe    Neurontin [Gabapentin]     Depression    Nexium [Esomeprazole]     Bad headaches    Nitrofuran Derivatives    Nortriptyline     Depression, decreased sex drive    Omeprazole-Sodium Bicarbonate     Chest pain-sore all over-HA   Ranitidine     Severe joint pain   Robaxin [Methocarbamol]  Made her very tired and unable to sleep at night Muscle weakness, fatigue    Tape Dermatitis   Tetanus Toxoid Other (See Comments)    Tetanus injection - blistering rash, swelling at injection site, turn into baseball size lump, joint pain    Tetanus-Diphtheria Toxoids Td Hives    Robust local reaction   Tetracyclines & Related     GI Upset    Tizanidine Other (See Comments)    "knocked her out"- had a rebound migraine, causes muscles to lock, and bedridden    Topamax [Topiramate]     Lethargic, daytime drowsiness, insomnia    Trazodone     Pt thinks it did not help - unsure of reaction    Tums [Calcium Carbonate] Other (See Comments)     Blistering Rash with high doses    Wound Dressing Adhesive Itching and Dermatitis    Bandages    Etodolac Rash    Blistering Rash, and swollen lymph nodes    Flexeril [Cyclobenzaprine] Palpitations    Irregular heartbeat    Ibuprofen Itching, Hives, Rash and Swelling    Throat itching   Neosporin [Neomycin-Bacitracin Zn-Polymyx] Rash    Blistering    Objective:   Today's Vitals   10/30/23 1020  BP: 124/68  Pulse: 79  Temp: 98.1 F (36.7 C)  TempSrc: Temporal  SpO2: 97%  Weight: 165 lb 12.8 oz (75.2 kg)  Height: 5\' 2"  (1.575 m)   Body mass index is 30.33 kg/m.   General: Well developed, well nourished. No acute distress. Psych: Alert and oriented. Normal mood and affect.  Health Maintenance Due  Topic Date Due   Pneumococcal Vaccine 54-45 Years old (1 of 2 - PCV) Never done   HIV Screening  Never done   Zoster Vaccines- Shingrix (1 of 2) Never done   MAMMOGRAM  10/07/2023     Assessment & Plan:   Problem List Items Addressed This Visit       Cardiovascular and Mediastinum   Essential hypertension - Primary   Blood pressure is at goal. Continue atenolol 25 mg 1/2 tab daily.      Relevant Medications   rosuvastatin (CRESTOR) 10 MG tablet   atenolol (TENORMIN) 25 MG tablet   Migraine headache   Stable. Continue sumatriptan (Imitrex) for breakthrough.      Relevant Medications   SUMAtriptan (IMITREX) 50 MG tablet   rosuvastatin (CRESTOR) 10 MG tablet   atenolol (TENORMIN) 25 MG tablet     Other   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)   Stable. Continue methylphenidate 10 mg TID.       Relevant Medications   methylphenidate (RITALIN) 10 MG tablet   Borderline hyperlipidemia   I will continue rosuvastatin 10 mg daily.      Relevant Medications   rosuvastatin (CRESTOR) 10 MG tablet   atenolol (TENORMIN) 25 MG tablet   Generalized anxiety disorder   Stable. Overall improved control of anxiety. Continue diazepam for  baseline control and alprazolam as needed for panic attacks.      Relevant Medications   ALPRAZolam (XANAX) 1 MG tablet   diazepam (VALIUM) 10 MG tablet   Panic disorder with agoraphobia   Stable. Continue use of alprazolam for breakthrough panic attacks.      Relevant Medications   ALPRAZolam (XANAX) 1 MG tablet   diazepam (VALIUM) 10 MG tablet    Return in about 2 months (around 12/28/2023) for Reassessment.   Loyola Mast, MD

## 2023-11-10 ENCOUNTER — Other Ambulatory Visit (HOSPITAL_BASED_OUTPATIENT_CLINIC_OR_DEPARTMENT_OTHER): Payer: Self-pay

## 2023-11-22 ENCOUNTER — Other Ambulatory Visit: Payer: Self-pay | Admitting: Physician Assistant

## 2023-11-22 DIAGNOSIS — R3129 Other microscopic hematuria: Secondary | ICD-10-CM

## 2023-11-23 ENCOUNTER — Other Ambulatory Visit (HOSPITAL_BASED_OUTPATIENT_CLINIC_OR_DEPARTMENT_OTHER): Payer: Self-pay

## 2023-11-23 LAB — LAB REPORT - SCANNED
Creatinine, POC: 16.9 mg/dL
EGFR: 73

## 2023-11-27 ENCOUNTER — Other Ambulatory Visit (HOSPITAL_BASED_OUTPATIENT_CLINIC_OR_DEPARTMENT_OTHER): Payer: Self-pay

## 2023-11-27 ENCOUNTER — Other Ambulatory Visit: Payer: Self-pay

## 2023-11-28 ENCOUNTER — Other Ambulatory Visit

## 2023-12-05 ENCOUNTER — Other Ambulatory Visit (HOSPITAL_BASED_OUTPATIENT_CLINIC_OR_DEPARTMENT_OTHER): Payer: Self-pay

## 2023-12-05 ENCOUNTER — Other Ambulatory Visit: Payer: Self-pay | Admitting: Family Medicine

## 2023-12-05 DIAGNOSIS — F908 Attention-deficit hyperactivity disorder, other type: Secondary | ICD-10-CM

## 2023-12-05 MED ORDER — METHYLPHENIDATE HCL 10 MG PO TABS
10.0000 mg | ORAL_TABLET | Freq: Three times a day (TID) | ORAL | 0 refills | Status: DC
  Filled 2023-12-05: qty 90, 30d supply, fill #0

## 2023-12-06 ENCOUNTER — Ambulatory Visit (INDEPENDENT_AMBULATORY_CARE_PROVIDER_SITE_OTHER): Payer: No Typology Code available for payment source | Admitting: Urology

## 2023-12-06 ENCOUNTER — Encounter: Payer: Self-pay | Admitting: Urology

## 2023-12-06 VITALS — BP 122/81 | HR 76 | Ht 62.0 in | Wt 158.0 lb

## 2023-12-06 DIAGNOSIS — R3129 Other microscopic hematuria: Secondary | ICD-10-CM

## 2023-12-06 DIAGNOSIS — N3941 Urge incontinence: Secondary | ICD-10-CM | POA: Diagnosis not present

## 2023-12-06 DIAGNOSIS — N3010 Interstitial cystitis (chronic) without hematuria: Secondary | ICD-10-CM | POA: Diagnosis not present

## 2023-12-06 LAB — MICROSCOPIC EXAMINATION

## 2023-12-06 LAB — URINALYSIS, ROUTINE W REFLEX MICROSCOPIC
Bilirubin, UA: NEGATIVE
Glucose, UA: NEGATIVE
Ketones, UA: NEGATIVE
Leukocytes,UA: NEGATIVE
Nitrite, UA: NEGATIVE
Protein,UA: NEGATIVE
Specific Gravity, UA: <1.005 — ABNORMAL LOW (ref 1.005–1.030)
Urobilinogen, Ur: 0.2 mg/dL (ref 0.2–1.0)
pH, UA: 6.5 (ref 5.0–7.5)

## 2023-12-06 NOTE — Progress Notes (Signed)
 Assessment: 1. Microscopic hematuria   2. Urge incontinence   3. Interstitial cystitis     Plan: I discussed further evaluation of the patient's symptoms with a CT scan.  She wishes to hold off at this time. Her symptoms are primarily external and began after intercourse. I advised her to contact the office if her symptoms do not improve with some time. Return to office in 6 months  Chief Complaint:  Chief Complaint  Patient presents with   Hematuria    History of Present Illness:  Samantha [name] is a 53 y.o. female who is seen for further evaluation of microscopic hematuria, interstitial cystitis, urethral stricture, urge incontinence, and possible renal mass. She has previously been followed at [facility] in [facility] and [facility]. She was last seen by me in April 2022.  She has previously been evaluated for microscopic hematuria by Dr. Lindley Magnus in May 2017.  At that time cystoscopy showed a normal bladder.  She has had continued microscopic hematuria.  Hematuria profile from 2/22 showed no malignant cells or dysplasia, blood and or erythrocytic casts indicating glomerular and/or renal tubular bleeding.  She was referred to nephrology for further evaluation.   She has had an extensive workup with autoimmune and genetic testing.  She has a history of interstitial cystitis which was managed with dietary modification.  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  She has had symptoms of frequency, urgency, and nocturia as well as sensation of incomplete emptying, hesitancy, and urge incontinence.  She was previously unable to tolerate Vesicare due to side effects of blurred vision, headaches, fatigue, constipation, and dry mouth.  She was given a trial of Gemtesa in April 2022.  CT imaging from April 2024 showed no renal or ureteral calculi and no evidence of obstruction. She underwent cystoscopy with retrograde  pyelograms, urethral dilation, and hydrodistention by Dr.Machen on 02/22/2023.  No filling defects or obstruction was seen on the pyelograms.  Her bladder was unremarkable without ulcerations or glomerulations.  Recent renal ultrasound from 04/13/2023 showed hypo to anechoic masses in the left kidney possibly representing cyst but incompletely characterized.  She reports 7 episodes of gross hematuria this year.  Her episodes typically last 1-2 days and resolve spontaneously.  She has associated symptoms of suprapubic pain, low back pain, dysuria, increased frequency, urgency.  No fevers or chills. She does not have significant urinary symptoms in between these episodes.  CT abdomen with and without contrast from 05/02/2023 showed no obvious renal masses and a punctate left renal stone. Hematuria profile from 04/27/2023 was negative for malignant cells or dysplasia and showed isomorphic erythrocytes indicating lower urinary tract or uroepithelial bleeding.  She returns today for follow-up.  She reports that she has had increased urinary symptoms within the past few weeks.  She noted pain with urination and some blood on her toilet paper.  She reports noticing that the skin around the urethra was torn and bleeding.  She has had increased urinary symptoms with intermittent stream and sensation of incomplete emptying.  She reports that she has generally felt bad with a decreased appetite. She is no longer using the Estrace vaginal cream as she states that it caused abdominal cramping.  Up on further questioning, she reports that her symptoms began after intercourse.  Portions of the above documentation were copied from a prior visit for review purposes only.   Past Medical History:  Past Medical History:  Diagnosis Date  ADD (attention deficit disorder)    Allergy    Anaphylaxis    Anemia    Anxiety    Arthritis    Asthma    Chronic back pain    Chronic kidney disease    Chronic neck pain     Complication of anesthesia    Waking up before procedure was over.   Degenerative disc disease at L5-S1 level    Dysrhythmia    Had  "abnormal beats" d/t B/P med she was on. B/P med reduced.   Endometriosis    Familial cold urticaria    Family history of adverse reaction to anesthesia    Fibromyalgia    GERD (gastroesophageal reflux disease)    Headache    Migraines   Hernia    Hyperlipidemia    Hypersomnia    Hypertension    IBS (irritable bowel syndrome)    IC (interstitial cystitis)    IgG Gliadin antibody positive    Low blood potassium    Migraines    Multiple allergies    OCD (obsessive compulsive disorder)    Raynaud disease    Raynaud's disease    Umbilical hernia    Vertigo    Vertigo     Past Surgical History:  Past Surgical History:  Procedure Laterality Date   BLADDER SURGERY     age 56- 34   CERVICAL FUSION     08/1998, 03/10/2017   CESAREAN SECTION     COLONOSCOPY     COMBINED HYSTEROSCOPY DIAGNOSTIC / D&C     CYSTO WITH HYDRODISTENSION N/A 02/22/2023   Procedure: HYDRODISTENSION;  Surgeon: Despina Arias, MD;  Location: [facility];  Service: Urology;  Laterality: N/A;   CYSTOSCOPY W/ RETROGRADES N/A 02/22/2023   Procedure: CYSTOSCOPY WITH BILATERAL RETROGRADE PYELOGRAM;  Surgeon: Despina Arias, MD;  Location: [facility];  Service: Urology;  Laterality: N/A;  60 MINUTES NEEDED FOR CASE   CYSTOSCOPY WITH URETHRAL DILATATION N/A 02/22/2023   Procedure: URETHRAL DILATATION;  Surgeon: Despina Arias, MD;  Location: [facility];  Service: Urology;  Laterality: N/A;   HAND SURGERY     2012, 2013   LAPAROSCOPIC ENDOMETRIOSIS FULGURATION Right    LESION REMOVAL  08/23/2012   Procedure: LESION REMOVAL HAND;  Surgeon: Wyn Forster., MD;  Location: [facility];  Service: Orthopedics;  Laterality: Left;   ROTATOR CUFF REPAIR     UPPER GASTROINTESTINAL ENDOSCOPY      Allergies:  Allergies  Allergen Reactions   Acetaminophen Rash    Blistering lymph  nodes, and blistering rash    Airduo Digihaler [Fluticasone-Salmeterol] Hives, Shortness Of Breath and Itching    Asthma attack   Allegra [Fexofenadine] Cough    She reports tachycardia and elevated BP and low Oxygen level after taking this   Aspirin Shortness Of Breath    High dose brings asthma attack    B-12 [Cyanocobalamin] Shortness Of Breath and Itching    Drop O2   Baclofen Other (See Comments)    Headache, muscle weakness, unable to urinate, unable to walk    Bactrim Anaphylaxis   Bee Venom Hives, Itching, Other (See Comments) and Rash   Benadryl [Diphenhydramine] Anaphylaxis    Only the liquid form she can not take because it causes throat closing,  DO NOT USE IN IV FORM - tablets are ok   Ciprofloxacin Shortness Of Breath    Blurred Vision, joint pain, headaches, dropped BP   Clindamycin Shortness Of Breath   Coconut (  Cocos Nucifera) Hives, Itching and Other (See Comments)    Coconut Oil    Coq-10 [Coenzyme Q10] Hives, Shortness Of Breath and Itching   Cymbalta [Duloxetine Hcl] Anaphylaxis and Shortness Of Breath   Darvon [Propoxyphene] Hives, Shortness Of Breath and Itching    Darvocet*   Flunisolide Shortness Of Breath and Other (See Comments)    Aerospan Nasal Inhaler* - caused blood clots in nose    Formaldehyde Anaphylaxis   Hydrocodone-Acetaminophen Hives, Shortness Of Breath and Itching    Vicodin, Norco   Nucynta [Tapentadol] Shortness Of Breath    Coughing, SOB    Other Hives, Itching, Rash and Other (See Comments)    Insect stings/ Bites  Wine spirit  Peanuts - Bring on Asthma attack   Cold Temperatures  - causes hives, and muscle spasms    Peanut-Containing Drug Products Hives, Shortness Of Breath, Itching, Swelling and Other (See Comments)   Potassium-Containing Compounds Anaphylaxis, Itching, Swelling and Palpitations    Tables, and Oral Solution    Ramipril Anaphylaxis   Skelaxin [Metaxalone] Hives, Shortness Of Breath and Itching    Brings on  Asthma Attacks    Sulfa Antibiotics Anaphylaxis   Sulfamethoxazole-Trimethoprim Anaphylaxis   Tezspire [Tezepelumab] Shortness Of Breath, Nausea And Vomiting, Swelling, Rash and Other (See Comments)    Muscle weakness, joint pain, sore throat   Triamcinolone Other (See Comments)    Kenalog Injection - muscle weakness, spasms, BP drop, lethargic, tongue swelling   Nasacort causes breathing issues    Voltaren [Diclofenac] Shortness Of Breath, Swelling and Rash    Gel*    Zofran [Ondansetron Hcl] Hives and Itching    Blurred Vision    Aspartame Other (See Comments)    Severe Migraines   Bacitra-Neomycin-Polymyxin-Hc Rash   Cranberry Extract Other (See Comments)    Throat itching / burning    Sulfur Hives and Itching   Aciphex [Rabeprazole]     Muscle weakness, headaches    Addyi [Flibanserin]     Decreased sex drive to 0   Alvesco [Ciclesonide] Other (See Comments)    Sores in mouth, coughing    Ambien [Zolpidem]     Mood Change, Aggressive, Insomnia    Amoxicillin     Pt unsure of Allergy    Ativan [Lorazepam] Other (See Comments)    Hallucination     Aygestin [Norethindrone]     Muscle weakness, abdominal pain   Celebrex [Celecoxib] Other (See Comments)    Possible Reaction - Not taken    Demeclocycline     Tetracycline Family-  GI Upset   Dexilant [Dexlansoprazole]     Severe joint pain    Doxycycline     Possibly joint pain or itch    Effexor Xr [Venlafaxine Hcl Er]     Migraines, weight gain, trouble sleeping   Elavil [Amitriptyline] Nausea And Vomiting    Headache, and muscle weakness   Erythromycin     Upset stomach   Erythromycin Base     Stomach Upset    Fentanyl Dermatitis    Patches*    Fioricet [Butalbital-Apap-Caffeine]     Acetaminophen    Hydrocodone Itching   Hydrocodone Bit-Homatrop Mbr Hives and Itching    Not true allergy. "irritated"   Hydromorphone Nausea Only   Inderal [Propranolol]     Agitation, difficulty sleeping    Indomethacin  Hives and Itching   Latex Itching    Skin Peeling    Levofloxacin     Weakness, myalgia   Lyrica [Pregabalin]  Muscle weakness, Causes Falls    Maxalt [Rizatriptan] Other (See Comments)    Pt was knocked out from med, extreme drowsiness and did not help migraine    Meloxicam Itching    Increases BP   Morphine Sulfate Er     Severe insomnia, does not help pain    Naproxen Hives and Itching    Head to Toe    Neurontin [Gabapentin]     Depression    Nexium [Esomeprazole]     Bad headaches    Nitrofuran Derivatives    Nortriptyline     Depression, decreased sex drive    Omeprazole-Sodium Bicarbonate     Chest pain-sore all over-HA   Ranitidine     Severe joint pain   Robaxin [Methocarbamol]     Made her very tired and unable to sleep at night Muscle weakness, fatigue    Tape Dermatitis   Tetanus Toxoid Other (See Comments)    Tetanus injection - blistering rash, swelling at injection site, turn into baseball size lump, joint pain    Tetanus-Diphtheria Toxoids Td Hives    Robust local reaction   Tetracyclines & Related     GI Upset    Tizanidine Other (See Comments)    "knocked her out"- had a rebound migraine, causes muscles to lock, and bedridden    Topamax [Topiramate]     Lethargic, daytime drowsiness, insomnia    Trazodone     Pt thinks it did not help - unsure of reaction    Tums [Calcium Carbonate] Other (See Comments)    Blistering Rash with high doses    Wound Dressing Adhesive Itching and Dermatitis    Bandages    Etodolac Rash    Blistering Rash, and swollen lymph nodes    Flexeril [Cyclobenzaprine] Palpitations    Irregular heartbeat    Ibuprofen Itching, Hives, Rash and Swelling    Throat itching   Neosporin [Neomycin-Bacitracin Zn-Polymyx] Rash    Blistering     Family History:  Family History  Problem Relation Age of Onset   Cancer Father        Multiple myeloma   ADD / ADHD Brother    Stomach cancer Maternal Grandfather    Colon cancer Neg  Hx    Esophageal cancer Neg Hx    Rectal cancer Neg Hx     Social History:  Social History   Tobacco Use   Smoking status: Never   Smokeless tobacco: Never  Vaping Use   Vaping status: Never Used  Substance Use Topics   Alcohol use: Yes    Comment: occasional   Drug use: No    ROS: Constitutional:  Negative for fever, chills, weight loss CV: Negative for chest pain, previous MI, hypertension Respiratory:  Negative for shortness of breath, wheezing, sleep apnea, frequent cough GI:  Negative for nausea, vomiting, bloody stool, GERD  Physical exam: BP 122/81   Pulse 76   Ht 5\' 2"  (1.575 m)   Wt 158 lb (71.7 kg)   LMP  (LMP Unknown)   BMI 28.90 kg/m  GENERAL APPEARANCE:  Well appearing, well developed, well nourished, NAD HEENT:  Atraumatic, normocephalic, oropharynx clear NECK:  Supple without lymphadenopathy or thyromegaly ABDOMEN:  Soft, non-tender, no masses EXTREMITIES:  Moves all extremities well, without clubbing, cyanosis, or edema NEUROLOGIC:  Alert and oriented x 3, normal gait, CN II-XII grossly intact MENTAL STATUS:  appropriate BACK:  Non-tender to palpation, No CVAT SKIN:  Warm, dry, and intact  Results: U/A: 0-5  WBCs, 0-2 RBCs, few bacteria

## 2023-12-18 ENCOUNTER — Other Ambulatory Visit: Payer: Self-pay

## 2023-12-18 ENCOUNTER — Other Ambulatory Visit: Payer: Self-pay | Admitting: Allergy and Immunology

## 2023-12-18 ENCOUNTER — Other Ambulatory Visit (HOSPITAL_BASED_OUTPATIENT_CLINIC_OR_DEPARTMENT_OTHER): Payer: Self-pay

## 2023-12-18 MED ORDER — AIRSUPRA 90-80 MCG/ACT IN AERO
2.0000 | INHALATION_SPRAY | Freq: Four times a day (QID) | RESPIRATORY_TRACT | 1 refills | Status: DC | PRN
  Filled 2023-12-18: qty 10.7, 15d supply, fill #0
  Filled 2024-01-03: qty 10.7, 15d supply, fill #1

## 2023-12-19 ENCOUNTER — Other Ambulatory Visit (HOSPITAL_BASED_OUTPATIENT_CLINIC_OR_DEPARTMENT_OTHER): Payer: Self-pay

## 2023-12-24 ENCOUNTER — Other Ambulatory Visit: Payer: Self-pay | Admitting: Family Medicine

## 2023-12-24 DIAGNOSIS — E785 Hyperlipidemia, unspecified: Secondary | ICD-10-CM

## 2023-12-28 ENCOUNTER — Ambulatory Visit: Payer: No Typology Code available for payment source | Admitting: Family Medicine

## 2023-12-28 ENCOUNTER — Encounter: Payer: Self-pay | Admitting: Family Medicine

## 2023-12-28 VITALS — BP 110/70 | HR 70 | Temp 98.0°F | Ht 62.0 in | Wt 163.2 lb

## 2023-12-28 DIAGNOSIS — I10 Essential (primary) hypertension: Secondary | ICD-10-CM | POA: Diagnosis not present

## 2023-12-28 DIAGNOSIS — E785 Hyperlipidemia, unspecified: Secondary | ICD-10-CM | POA: Diagnosis not present

## 2023-12-28 DIAGNOSIS — M1712 Unilateral primary osteoarthritis, left knee: Secondary | ICD-10-CM | POA: Diagnosis not present

## 2023-12-28 DIAGNOSIS — R7303 Prediabetes: Secondary | ICD-10-CM

## 2023-12-28 LAB — COMPREHENSIVE METABOLIC PANEL WITH GFR
ALT: 13 U/L (ref 0–35)
AST: 16 U/L (ref 0–37)
Albumin: 4.7 g/dL (ref 3.5–5.2)
Alkaline Phosphatase: 76 U/L (ref 39–117)
BUN: 16 mg/dL (ref 6–23)
CO2: 34 meq/L — ABNORMAL HIGH (ref 19–32)
Calcium: 9.7 mg/dL (ref 8.4–10.5)
Chloride: 102 meq/L (ref 96–112)
Creatinine, Ser: 1.09 mg/dL (ref 0.40–1.20)
GFR: 58.3 mL/min — ABNORMAL LOW (ref >60.00)
Glucose, Bld: 101 mg/dL — ABNORMAL HIGH (ref 70–99)
Potassium: 4.2 meq/L (ref 3.5–5.1)
Sodium: 141 meq/L (ref 135–145)
Total Bilirubin: 0.5 mg/dL (ref 0.2–1.2)
Total Protein: 6.6 g/dL (ref 6.0–8.3)

## 2023-12-28 LAB — LIPID PANEL
Cholesterol: 144 mg/dL (ref 0–200)
HDL: 59 mg/dL (ref >39.00)
LDL Cholesterol: 69 mg/dL (ref 0–99)
NonHDL: 84.9
Total CHOL/HDL Ratio: 2
Triglycerides: 81 mg/dL (ref 0.0–149.0)
VLDL: 16.2 mg/dL (ref 0.0–40.0)

## 2023-12-28 LAB — HEMOGLOBIN A1C: Hgb A1c MFr Bld: 5.8 % (ref 4.6–6.5)

## 2023-12-28 NOTE — Progress Notes (Signed)
 [facility] PRIMARY CARE [facility] Trecia Rogers [facility] [address] [place] [address] Dept: [phone] Dept Fax: [phone]  Chronic Care Office Visit  Subjective:    Patient ID: Samantha [name], female    DOB: [birth year], 53 y.o..   MRN: [id number]  Chief Complaint  Patient presents with   Hypertension    2 month f/u HTN.     History of Present Illness:  Patient is in today for reassessment of chronic medical issues.  Ms. Samantha [name] has a history of hypertension and is managed on atenolol 25 mg 1/2 tab daily.  Ms. Samantha [name] has a history of hyperlipidemia. She is managed on rosuvastatin 10 mg daily.   Ms. Samantha [name] has a history of asthma and allergy symptoms. She is followed by Dr. Darcey Nora (allergist).  She notes that currently, her symptoms have been flared with the springtime pollen surge. She is needing to use her albuterol inhaler some. She depends on her azelastine nasal spray primarily to control nasal symptoms. She notes she has side effects from nasal steroid use.   Ms. Samantha [name] has multiple psychiatric issues including anxiety, PTSD, panic disorder, and ADHD. She is managed on Valium for baseline control of her anxiety and management of insomnia, and Xanax for breakthrough panic issues or acute anxiety. She is on methylphenidate TID for management of her ADHD and daytime hypersomnia. She notes a number of deaths among family and close friends. She feels she is managing with this and feels she is getting over her grief better than previously.  Ms. Samantha [name] notes a number of diffuse joint pains. She feels she is managing with these for now.  Past Medical History: Patient Active Problem List   Diagnosis Date Noted   COVID-19 long hauler 06/26/2023   Gross hematuria 04/27/2023   Renal mass 04/24/2023   Cervical spine arthritis 02/25/2022   Leg swelling 10/19/2021   Interstitial cystitis 09/15/2021   Other urethral stricture, female 09/15/2021   Chronic  kidney disease, stage 3a (HCC) 08/20/2021   Prediabetes 08/27/2020   History of [place] spotted fever 02/04/2020   Moderate persistent asthma without complication 08/02/2019   LPRD (laryngopharyngeal reflux disease) 08/02/2019   Chronic rhinitis 08/02/2019   Chronic post-traumatic stress disorder (PTSD) 11/21/2018   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) 11/21/2018   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/2018   Pulmonary nodules 11/13/2018   Other microscopic hematuria 11/06/2018   Generalized anxiety disorder 07/04/2018   Fecal incontinence 07/02/2018   Leg weakness 06/12/2017   Left ear hearing loss 02/07/2017   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   Borderline hyperlipidemia 11/08/2016   Acute idiopathic gout of multiple sites 11/08/2016   Elbow pain, chronic, right 10/06/2016   Lateral epicondylitis, right elbow 10/06/2016   Fibromyalgia 10/04/2016   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   Migraine headache 07/29/2016   Irritable bowel syndrome with both constipation and diarrhea 07/29/2016   Hives 07/18/2016   Urinary incontinence 06/02/2016   Atherosclerosis of abdominal aorta (HCC) 09/03/2015   Carpal tunnel syndrome 11/19/2014   Primary hypersomnia 11/18/2014   Insomnia 11/18/2014   Lumbar radicular pain 11/18/2014   Chronic pain syndrome 11/18/2014   Cervical disc disorder with radiculopathy 11/18/2014   Chronic female pelvic pain 07/31/2014   Degeneration of intervertebral disc of lumbosacral region 07/31/2014   Rosacea 07/31/2014   Morton's neuroma 07/31/2014   Benign paroxysmal positional vertigo 07/31/2014   Essential hypertension 07/31/2014   History of methicillin resistant Staphylococcus aureus infection 07/09/2014  Past Surgical History:  Procedure Laterality Date   BLADDER SURGERY     age 78- 34   CERVICAL FUSION     08/1998, 03/10/2017   CESAREAN SECTION     COLONOSCOPY     COMBINED  HYSTEROSCOPY DIAGNOSTIC / D&C     CYSTO WITH HYDRODISTENSION N/A 02/22/2023   Procedure: HYDRODISTENSION;  Surgeon: Mallie Seal, MD;  Location: [facility];  Service: Urology;  Laterality: N/A;   CYSTOSCOPY W/ RETROGRADES N/A 02/22/2023   Procedure: CYSTOSCOPY WITH BILATERAL RETROGRADE PYELOGRAM;  Surgeon: Mallie Seal, MD;  Location: [facility];  Service: Urology;  Laterality: N/A;  60 MINUTES NEEDED FOR CASE   CYSTOSCOPY WITH URETHRAL DILATATION N/A 02/22/2023   Procedure: URETHRAL DILATATION;  Surgeon: Mallie Seal, MD;  Location: [facility];  Service: Urology;  Laterality: N/A;   HAND SURGERY     2012, 2013   LAPAROSCOPIC ENDOMETRIOSIS FULGURATION Right    LESION REMOVAL  08/23/2012   Procedure: LESION REMOVAL HAND;  Surgeon: Amelie Baize., MD;  Location: [facility];  Service: Orthopedics;  Laterality: Left;   ROTATOR CUFF REPAIR     UPPER GASTROINTESTINAL ENDOSCOPY     Family History  Problem Relation Age of Onset   Cancer Father        Multiple myeloma   ADD / ADHD Brother    Stomach cancer Maternal Grandfather    Colon cancer Neg Hx    Esophageal cancer Neg Hx    Rectal cancer Neg Hx    Outpatient Medications Prior to Visit  Medication Sig Dispense Refill   AIRSUPRA 90-80 MCG/ACT AERO Inhale 2 puffs into the lungs every 6 (six) hours as needed. 10.7 g 1   albuterol (VENTOLIN HFA) 108 (90 Base) MCG/ACT inhaler INHALE 2 PUFFS BY MOUTH EVERY 4 HOURS AS NEEDED FOR WHEEZING FOR SHORTNESS OF BREATH 18 g 0   ALPRAZolam (XANAX) 1 MG tablet Take 1 tablet (1 mg total) by mouth 2 (two) times daily as needed. 20 tablet 2   Alum Hydroxide-Mag Trisilicate (GAVISCON) 80-14.2 MG CHEW Chew 2 tablets by mouth as needed (acid reflux).     aspirin EC 81 MG tablet Take 81 mg by mouth daily. Swallow whole.     atenolol (TENORMIN) 25 MG tablet Take 0.5 tablets (12.5 mg total) by mouth daily. 30 tablet 0   AUVI-Q 0.3 MG/0.3ML SOAJ injection Inject 0.3 mg into the muscle as needed  for anaphylaxis. As directed for life-threatening allergic reactions 2 each 1   azelastine (ASTELIN) 0.1 % nasal spray Place 2 sprays into each nostril 1-2 times per day. 30 mL 5   benzonatate (TESSALON) 100 MG capsule Take 1 capsule (100 mg total) by mouth 3 (three) times daily as needed for cough. 90 capsule 1   budesonide (PULMICORT) 0.25 MG/2ML nebulizer solution Take 0.25 mg by nebulization 2 (two) times daily as needed (Asthma).     Cholecalciferol (VITAMIN D) 50 MCG (2000 UT) CAPS Take 2,000 Units by mouth daily.     cimetidine (TAGAMET) 200 MG tablet Take 200 mg by mouth 2 (two) times daily.     diazepam (VALIUM) 10 MG tablet Take 1 tablet (10 mg total) by mouth every 6 (six) hours as needed for anxiety or sleep (vertigo/muscle spasms). 30 tablet 2   diphenhydrAMINE (BENADRYL) 12.5 MG chewable tablet Chew 25 mg by mouth 4 (four) times daily as needed for allergies.     EPINEPHrine (PRIMATENE MIST) 0.125 MG/ACT AERO Inhale 1 puff  into the lungs as needed (Asthma).     famotidine (PEPCID AC MAXIMUM STRENGTH) 20 MG tablet Take 20 mg by mouth daily as needed (Hives/itching).     guaifenesin (HUMIBID E) 400 MG TABS tablet Take 400-800 mg by mouth every 4 (four) hours as needed (Cough/mucus).     ipratropium (ATROVENT) 0.06 % nasal spray Place 2 sprays into each nostril 1-2 times per day. 15 mL 5   levalbuterol (XOPENEX HFA) 45 MCG/ACT inhaler Inhale 2 puffs into the lungs every 6 (six) hours as needed for wheezing. 15 g 1   magnesium oxide (MAG-OX) 400 (240 Mg) MG tablet Take 400 mg by mouth daily.     methylphenidate (RITALIN) 10 MG tablet Take 1 tablet (10 mg total) by mouth 3 (three) times daily with meals. 90 tablet 0   Olopatadine HCl (PATADAY) 0.2 % SOLN Place 1 drop into both eyes 2 (two) times daily as needed (eye allergies).     rosuvastatin (CRESTOR) 10 MG tablet Take 1 tablet by mouth once daily 90 tablet 3   SUMAtriptan (IMITREX) 50 MG tablet Take 1 tablet (50 mg total) by mouth once  as needed. May repeat in 2 hours if headache persists or recurs. 10 tablet 3   estradiol (ESTRACE) 0.1 MG/GM vaginal cream Place 1 gram vaginally 2 (two) times a week at bedtime for 90 days. (Patient not taking: Reported on 12/28/2023) 42.5 g 3   No facility-administered medications prior to visit.   Allergies  Allergen Reactions   Acetaminophen Rash    Blistering lymph nodes, and blistering rash    Airduo Digihaler [Fluticasone-Salmeterol] Hives, Shortness Of Breath and Itching    Asthma attack   Allegra [Fexofenadine] Cough    She reports tachycardia and elevated BP and low Oxygen level after taking this   Aspirin Shortness Of Breath    High dose brings asthma attack    B-12 [Cyanocobalamin] Shortness Of Breath and Itching    Drop O2   Baclofen Other (See Comments)    Headache, muscle weakness, unable to urinate, unable to walk    Bactrim Anaphylaxis   Bee Venom Hives, Itching, Other (See Comments) and Rash   Benadryl [Diphenhydramine] Anaphylaxis    Only the liquid form she can not take because it causes throat closing,  DO NOT USE IN IV FORM - tablets are ok   Ciprofloxacin Shortness Of Breath    Blurred Vision, joint pain, headaches, dropped BP   Clindamycin Shortness Of Breath   Coconut (Cocos Nucifera) Hives, Itching and Other (See Comments)    Coconut Oil    Coq-10 [Coenzyme Q10] Hives, Shortness Of Breath and Itching   Cymbalta [Duloxetine Hcl] Anaphylaxis and Shortness Of Breath   Darvon [Propoxyphene] Hives, Shortness Of Breath and Itching    Darvocet*   Flunisolide Shortness Of Breath and Other (See Comments)    Aerospan Nasal Inhaler* - caused blood clots in nose    Fluticasone Furoate Anaphylaxis, Cough, Nausea And Vomiting, Palpitations and Shortness Of Breath   Formaldehyde Anaphylaxis   Hydrocodone-Acetaminophen Hives, Shortness Of Breath and Itching    Vicodin, Norco   Nucynta [Tapentadol] Shortness Of Breath    Coughing, SOB    Other Hives, Itching, Rash and  Other (See Comments)    Insect stings/ Bites  Wine spirit  Peanuts - Bring on Asthma attack   Cold Temperatures  - causes hives, and muscle spasms    Peanut-Containing Drug Products Hives, Shortness Of Breath, Itching, Swelling and Other (See Comments)  Potassium-Containing Compounds Anaphylaxis, Itching, Swelling and Palpitations    Tables, and Oral Solution    Ramipril Anaphylaxis   Skelaxin [Metaxalone] Hives, Shortness Of Breath and Itching    Brings on Asthma Attacks    Sulfa Antibiotics Anaphylaxis   Sulfamethoxazole-Trimethoprim Anaphylaxis   Tezspire [Tezepelumab] Shortness Of Breath, Nausea And Vomiting, Swelling, Rash and Other (See Comments)    Muscle weakness, joint pain, sore throat   Triamcinolone Other (See Comments)    Kenalog Injection - muscle weakness, spasms, BP drop, lethargic, tongue swelling   Nasacort causes breathing issues    Voltaren [Diclofenac] Shortness Of Breath, Swelling and Rash    Gel*    Zofran [Ondansetron Hcl] Hives and Itching    Blurred Vision    Aspartame Other (See Comments)    Severe Migraines   Bacitra-Neomycin-Polymyxin-Hc Rash   Cranberry Extract Other (See Comments)    Throat itching / burning    Sulfur Hives and Itching   Aciphex [Rabeprazole]     Muscle weakness, headaches    Addyi [Flibanserin]     Decreased sex drive to 0   Alvesco [Ciclesonide] Other (See Comments)    Sores in mouth, coughing    Ambien [Zolpidem]     Mood Change, Aggressive, Insomnia    Amoxicillin     Pt unsure of Allergy    Ativan [Lorazepam] Other (See Comments)    Hallucination     Aygestin [Norethindrone]     Muscle weakness, abdominal pain   Celebrex [Celecoxib] Other (See Comments)    Possible Reaction - Not taken    Demeclocycline     Tetracycline Family-  GI Upset   Dexilant [Dexlansoprazole]     Severe joint pain    Doxycycline     Possibly joint pain or itch    Effexor Xr [Venlafaxine Hcl Er]     Migraines, weight gain, trouble  sleeping   Elavil [Amitriptyline] Nausea And Vomiting    Headache, and muscle weakness   Erythromycin     Upset stomach   Erythromycin Base     Stomach Upset    Fentanyl Dermatitis    Patches*    Fioricet [Butalbital-Apap-Caffeine]     Acetaminophen    Hydrocodone Itching   Hydrocodone Bit-Homatrop Mbr Hives and Itching    Not true allergy. "irritated"   Hydromorphone Nausea Only   Inderal [Propranolol]     Agitation, difficulty sleeping    Indomethacin Hives and Itching   Latex Itching    Skin Peeling    Levofloxacin     Weakness, myalgia   Lyrica [Pregabalin]     Muscle weakness, Causes Falls    Maxalt [Rizatriptan] Other (See Comments)    Pt was knocked out from med, extreme drowsiness and did not help migraine    Meloxicam Itching    Increases BP   Morphine Sulfate Er     Severe insomnia, does not help pain    Naproxen Hives and Itching    Head to Toe    Neurontin [Gabapentin]     Depression    Nexium [Esomeprazole]     Bad headaches    Nitrofuran Derivatives    Nortriptyline     Depression, decreased sex drive    Omeprazole-Sodium Bicarbonate     Chest pain-sore all over-HA   Ranitidine     Severe joint pain   Robaxin [Methocarbamol]     Made her very tired and unable to sleep at night Muscle weakness, fatigue    Tape Dermatitis  Tetanus Toxoid Other (See Comments)    Tetanus injection - blistering rash, swelling at injection site, turn into baseball size lump, joint pain    Tetanus-Diphtheria Toxoids Td Hives    Robust local reaction   Tetracyclines & Related     GI Upset    Tizanidine Other (See Comments)    "knocked her out"- had a rebound migraine, causes muscles to lock, and bedridden    Topamax [Topiramate]     Lethargic, daytime drowsiness, insomnia    Trazodone     Pt thinks it did not help - unsure of reaction    Tums [Calcium Carbonate] Other (See Comments)    Blistering Rash with high doses    Wound Dressing Adhesive Itching and Dermatitis     Bandages    Etodolac Rash    Blistering Rash, and swollen lymph nodes    Flexeril [Cyclobenzaprine] Palpitations    Irregular heartbeat    Ibuprofen Itching, Hives, Rash and Swelling    Throat itching   Neosporin [Neomycin-Bacitracin Zn-Polymyx] Rash    Blistering    Objective:   Today's Vitals   12/28/23 1023  BP: 110/70  Pulse: 70  Temp: 98 F (36.7 C)  TempSrc: Temporal  SpO2: 97%  Weight: 163 lb 3.2 oz (74 kg)  Height: 5\' 2"  (1.575 m)   Body mass index is 29.85 kg/m.   General: Well developed, well nourished. No acute distress. Lungs: Mild prolonged expiratory phase vs. mild wheeze. No rales or rhonchi. Extremities: Full ROM. There eis mild swelling and increased warmth of the left knee.  Psych: Alert and oriented. Normal mood and affect.  Health Maintenance Due  Topic Date Due   HIV Screening  Never done   Pneumococcal Vaccine 59-47 Years old (1 of 2 - PCV) Never done   Zoster Vaccines- Shingrix (1 of 2) Never done     Assessment & Plan:   Problem List Items Addressed This Visit       Cardiovascular and Mediastinum   Essential hypertension - Primary   Blood pressure is at goal. Continue atenolol 25 mg 1/2 tab daily.      Relevant Orders   Comprehensive metabolic panel with GFR     Other   Borderline hyperlipidemia   I will check lipids today. Continue rosuvastatin 10 mg daily.      Relevant Orders   Lipid panel   Prediabetes   I will reassess blood sugar and A1c today.      Relevant Orders   Hemoglobin A1c   Comprehensive metabolic panel with GFR   Other Visit Diagnoses       Arthritis of left knee       There is some active inflammation of the left knee today. Ms. Samantha [name] will continue to manage this expectantly.       Return in about 2 months (around 02/27/2024) for Reassessment.   Graig Lawyer, MD

## 2023-12-28 NOTE — Assessment & Plan Note (Signed)
 I will reassess blood sugar and A1c today.

## 2023-12-28 NOTE — Assessment & Plan Note (Signed)
 Blood pressure is at goal. Continue atenolol 25 mg 1/2 tab daily.

## 2023-12-28 NOTE — Assessment & Plan Note (Signed)
 I will check lipids today. Continue rosuvastatin 10 mg daily.

## 2023-12-29 ENCOUNTER — Encounter: Payer: Self-pay | Admitting: Family Medicine

## 2024-01-03 ENCOUNTER — Other Ambulatory Visit: Payer: Self-pay | Admitting: Family Medicine

## 2024-01-03 ENCOUNTER — Other Ambulatory Visit (HOSPITAL_BASED_OUTPATIENT_CLINIC_OR_DEPARTMENT_OTHER): Payer: Self-pay

## 2024-01-03 ENCOUNTER — Other Ambulatory Visit: Payer: Self-pay

## 2024-01-03 DIAGNOSIS — F411 Generalized anxiety disorder: Secondary | ICD-10-CM

## 2024-01-03 DIAGNOSIS — F4001 Agoraphobia with panic disorder: Secondary | ICD-10-CM

## 2024-01-03 MED ORDER — DIAZEPAM 10 MG PO TABS
10.0000 mg | ORAL_TABLET | Freq: Four times a day (QID) | ORAL | 2 refills | Status: DC | PRN
  Filled 2024-01-03: qty 30, 8d supply, fill #0
  Filled 2024-01-19: qty 30, 8d supply, fill #1
  Filled 2024-01-29: qty 30, 8d supply, fill #2

## 2024-01-03 MED ORDER — ALPRAZOLAM 1 MG PO TABS
1.0000 mg | ORAL_TABLET | Freq: Two times a day (BID) | ORAL | 2 refills | Status: DC | PRN
  Filled 2024-01-03: qty 20, 10d supply, fill #0
  Filled 2024-01-19: qty 20, 10d supply, fill #1
  Filled 2024-01-29: qty 20, 10d supply, fill #2

## 2024-01-11 ENCOUNTER — Other Ambulatory Visit: Payer: Self-pay

## 2024-01-11 ENCOUNTER — Other Ambulatory Visit (HOSPITAL_BASED_OUTPATIENT_CLINIC_OR_DEPARTMENT_OTHER): Payer: Self-pay

## 2024-01-11 ENCOUNTER — Ambulatory Visit: Admitting: Nurse Practitioner

## 2024-01-11 ENCOUNTER — Encounter: Payer: Self-pay | Admitting: Nurse Practitioner

## 2024-01-11 ENCOUNTER — Other Ambulatory Visit: Payer: Self-pay | Admitting: Family Medicine

## 2024-01-11 VITALS — BP 110/90 | HR 61 | Temp 97.8°F | Ht 62.0 in | Wt 163.0 lb

## 2024-01-11 DIAGNOSIS — F908 Attention-deficit hyperactivity disorder, other type: Secondary | ICD-10-CM

## 2024-01-11 DIAGNOSIS — J0140 Acute pansinusitis, unspecified: Secondary | ICD-10-CM

## 2024-01-11 DIAGNOSIS — I10 Essential (primary) hypertension: Secondary | ICD-10-CM

## 2024-01-11 MED ORDER — AZITHROMYCIN 250 MG PO TABS
ORAL_TABLET | ORAL | 0 refills | Status: AC
  Filled 2024-01-11: qty 6, 5d supply, fill #0

## 2024-01-11 MED ORDER — BENZONATATE 200 MG PO CAPS
200.0000 mg | ORAL_CAPSULE | Freq: Three times a day (TID) | ORAL | 0 refills | Status: DC | PRN
  Filled 2024-01-11: qty 21, 7d supply, fill #0

## 2024-01-11 MED ORDER — ATENOLOL 25 MG PO TABS
12.5000 mg | ORAL_TABLET | Freq: Every day | ORAL | 0 refills | Status: DC
  Filled 2024-01-11: qty 30, 60d supply, fill #0

## 2024-01-11 MED ORDER — METHYLPREDNISOLONE 4 MG PO TBPK
ORAL_TABLET | ORAL | 0 refills | Status: AC
  Filled 2024-01-11: qty 21, 6d supply, fill #0

## 2024-01-11 MED ORDER — METHYLPHENIDATE HCL 10 MG PO TABS
10.0000 mg | ORAL_TABLET | Freq: Three times a day (TID) | ORAL | 0 refills | Status: DC
  Filled 2024-01-11: qty 90, 30d supply, fill #0

## 2024-01-11 NOTE — Patient Instructions (Addendum)
 URI Instructions: Encourage adequate oral hydration. Use mucinex DM or Robitussin  or delsym for cough without sinus congestion  You can use plain "Tylenol " or "Advil" for fever, chills and achyness. Use cool mist humidifier at bedtime to help with nasal congestion and cough.  Cold/cough medications may have tylenol  or ibuprofen or guaifenesin or dextromethophan in them, so be careful not to take beyond the recommended dose for each of these medications.  Diarrhea, Adult Diarrhea is when you pass loose and sometimes watery poop (stool) often. Diarrhea can make you feel weak and cause you to lose water  in your body (get dehydrated). Losing water  in your body can cause you to: Feel tired and thirsty. Have a dry mouth. Go pee (urinate) less often. Diarrhea often lasts 2-3 days. It can last longer if it is a sign of something more serious. Be sure to treat your diarrhea as told by your doctor. Follow these instructions at home: Eating and drinking     Follow these instructions as told by your doctor: Take an ORS (oral rehydration solution). This is a drink that helps you replace fluids and minerals your body lost. It is sold at pharmacies and stores. Drink enough fluid to keep your pee (urine) pale yellow. Drink fluids such as: Water . You can also get fluids by sucking on ice chips. Diluted fruit juice. Low-calorie sports drinks. Milk. Avoid drinking fluids that have a lot of sugar or caffeine in them. These include soda, energy drinks, and regular sports drinks. Avoid alcohol. Eat bland, easy-to-digest foods in small amounts as you are able. These foods include: Bananas. Applesauce. Rice. Low-fat (lean) meats. Toast. Crackers. Avoid spicy or fatty foods.  Medicines Take over-the-counter and prescription medicines only as told by your doctor. If you were prescribed antibiotics, take them as told by your doctor. Do not stop taking them even if you start to feel better. General  instructions  Wash your hands often using soap and water  for 20 seconds. If soap and water  are not available, use hand sanitizer. Others in your home should wash their hands as well. Wash your hands: After using the toilet or changing a diaper. Before preparing, cooking, or serving food. While caring for a sick person. While visiting someone in a hospital. Rest at home while you get better. Take a warm bath to help with any burning or pain from having diarrhea. Watch your condition for any changes. Contact a doctor if: You have a fever. Your diarrhea gets worse. You have new symptoms. You vomit every time you eat or drink. You feel light-headed, dizzy, or you have a headache. You have muscle cramps. You have signs of losing too much water  in your body, such as: Dark pee, very little pee, or no pee. Cracked lips. Dry mouth. Sunken eyes. Sleepiness. Weakness. You have bloody or black poop or poop that looks like tar. You have very bad pain, cramping, or bloating in your belly (abdomen). Your skin feels cold and clammy. You feel confused. Get help right away if: You have chest pain. Your heart is beating very quickly. You have trouble breathing or you are breathing very quickly. You feel very weak or you faint. These symptoms may be an emergency. Get help right away. Call 911. Do not wait to see if the symptoms will go away. Do not drive yourself to the hospital. This information is not intended to replace advice given to you by your health care provider. Make sure you discuss any questions you have  with your health care provider. Document Revised: 02/15/2022 Document Reviewed: 02/15/2022 Elsevier Patient Education  2024 [employer].

## 2024-01-11 NOTE — Progress Notes (Addendum)
 Acute Office Visit  Subjective:    Patient ID: Samantha [name], female    DOB: Mar 03, 1971, 53 y.o.   MRN: [id number]  Chief Complaint  Patient presents with   Nasal Congestion    As well as HA, cough, diarrhea, congestion. Last BM was this morning, but Pt does have IBS.Sore lymph nodes.   Sinusitis This is a new problem. The current episode started 1 to 4 weeks ago. The problem has been waxing and waning since onset. There has been no fever. Associated symptoms include chills, congestion, coughing, headaches, a hoarse voice, sinus pressure, sneezing and a sore throat. Pertinent negatives include no diaphoresis, ear pain, neck pain or shortness of breath. Past treatments include nasal decongestants and acetaminophen  (azelastine , mucinex). The treatment provided no relief.  Sick contact with son and husband. Home COVID test: negative Diarrhea and bloating 1x/day, soft stool, no blood in stool, No nausea, no ABDOMEN pain, no emesis. She states oral prednisone  and azithromycin  taken in last with no adverse effects.  Outpatient Medications Prior to Visit  Medication Sig   AIRSUPRA  90-80 MCG/ACT AERO Inhale 2 puffs into the lungs every 6 (six) hours as needed.   albuterol  (VENTOLIN  HFA) 108 (90 Base) MCG/ACT inhaler INHALE 2 PUFFS BY MOUTH EVERY 4 HOURS AS NEEDED FOR WHEEZING FOR SHORTNESS OF BREATH   ALPRAZolam  (XANAX ) 1 MG tablet Take 1 tablet (1 mg total) by mouth 2 (two) times daily as needed.   Alum Hydroxide-Mag Trisilicate (GAVISCON) 80-14.2 MG CHEW Chew 2 tablets by mouth as needed (acid reflux).   aspirin EC 81 MG tablet Take 81 mg by mouth daily. Swallow whole.   atenolol  (TENORMIN ) 25 MG tablet Take 0.5 tablets (12.5 mg total) by mouth daily.   AUVI-Q  0.3 MG/0.3ML SOAJ injection Inject 0.3 mg into the muscle as needed for anaphylaxis. As directed for life-threatening allergic reactions   azelastine  (ASTELIN ) 0.1 % nasal spray Place 2 sprays into each nostril 1-2 times per  day.   budesonide  (PULMICORT ) 0.25 MG/2ML nebulizer solution Take 0.25 mg by nebulization 2 (two) times daily as needed (Asthma).   Cholecalciferol (VITAMIN D ) 50 MCG (2000 UT) CAPS Take 2,000 Units by mouth daily.   cimetidine (TAGAMET) 200 MG tablet Take 200 mg by mouth 2 (two) times daily.   diazepam  (VALIUM ) 10 MG tablet Take 1 tablet (10 mg total) by mouth every 6 (six) hours as needed for anxiety or sleep (vertigo/muscle spasms).   diphenhydrAMINE  (BENADRYL ) 12.5 MG chewable tablet Chew 25 mg by mouth 4 (four) times daily as needed for allergies.   EPINEPHrine  (PRIMATENE  MIST) 0.125 MG/ACT AERO Inhale 1 puff into the lungs as needed (Asthma).   estradiol  (ESTRACE ) 0.1 MG/GM vaginal cream Place 1 gram vaginally 2 (two) times a week at bedtime for 90 days.   famotidine (PEPCID AC MAXIMUM STRENGTH) 20 MG tablet Take 20 mg by mouth daily as needed (Hives/itching).   guaifenesin (HUMIBID E) 400 MG TABS tablet Take 400-800 mg by mouth every 4 (four) hours as needed (Cough/mucus).   ipratropium (ATROVENT ) 0.06 % nasal spray Place 2 sprays into each nostril 1-2 times per day.   levalbuterol  (XOPENEX  HFA) 45 MCG/ACT inhaler Inhale 2 puffs into the lungs every 6 (six) hours as needed for wheezing.   magnesium oxide (MAG-OX) 400 (240 Mg) MG tablet Take 400 mg by mouth daily.   Olopatadine HCl (PATADAY) 0.2 % SOLN Place 1 drop into both eyes 2 (two) times daily as needed (eye allergies).   rosuvastatin  (  CRESTOR ) 10 MG tablet Take 1 tablet by mouth once daily   SUMAtriptan  (IMITREX ) 50 MG tablet Take 1 tablet (50 mg total) by mouth once as needed. May repeat in 2 hours if headache persists or recurs.   [DISCONTINUED] benzonatate  (TESSALON ) 100 MG capsule Take 1 capsule (100 mg total) by mouth 3 (three) times daily as needed for cough.   methylphenidate  (RITALIN ) 10 MG tablet Take 1 tablet (10 mg total) by mouth 3 (three) times daily with meals.   No facility-administered medications prior to visit.    Reviewed past medical and social history.  Review of Systems  Constitutional:  Positive for chills. Negative for diaphoresis.  HENT:  Positive for congestion, hoarse voice, sinus pressure, sneezing and sore throat. Negative for ear pain.   Respiratory:  Positive for cough. Negative for shortness of breath.   Musculoskeletal:  Negative for neck pain.  Neurological:  Positive for headaches.   Per HPI     Objective:    Physical Exam Vitals and nursing note reviewed.  Constitutional:      General: She is not in acute distress. HENT:     Right Ear: Tympanic membrane, ear canal and external ear normal.     Left Ear: Tympanic membrane, ear canal and external ear normal.     Nose: Congestion and rhinorrhea present. No nasal tenderness or mucosal edema.     Right Nostril: No occlusion.     Left Nostril: No occlusion.     Right Turbinates: Not enlarged, swollen or pale.     Left Turbinates: Not enlarged, swollen or pale.     Right Sinus: No maxillary sinus tenderness or frontal sinus tenderness.     Left Sinus: No maxillary sinus tenderness or frontal sinus tenderness.     Mouth/Throat:     Pharynx: Oropharynx is clear. Uvula midline.     Tonsils: No tonsillar exudate or tonsillar abscesses.  Eyes:     Extraocular Movements: Extraocular movements intact.     Conjunctiva/sclera: Conjunctivae normal.  Cardiovascular:     Rate and Rhythm: Normal rate and regular rhythm.     Pulses: Normal pulses.     Heart sounds: Normal heart sounds.  Pulmonary:     Effort: Pulmonary effort is normal.     Breath sounds: Normal breath sounds.  Musculoskeletal:     Cervical back: Normal range of motion and neck supple.     Right lower leg: No edema.     Left lower leg: No edema.  Lymphadenopathy:     Cervical: No cervical adenopathy.  Neurological:     Mental Status: She is alert and oriented to person, place, and time.    BP (!) 110/90 (BP Location: Right Arm, Patient Position: Sitting)    Pulse 61   Temp 97.8 F (36.6 C)   Ht 5\' 2"  (1.575 m)   Wt 163 lb (73.9 kg)   LMP  (LMP Unknown)   SpO2 97%   BMI 29.81 kg/m    No results found for any visits on 01/11/24.     Assessment & Plan:   Problem List Items Addressed This Visit   None Visit Diagnoses       Acute non-recurrent pansinusitis    -  Primary   Relevant Medications   azithromycin  (ZITHROMAX  Z-PAK) 250 MG tablet   methylPREDNISolone  (MEDROL  DOSEPAK) 4 MG TBPK tablet   benzonatate  (TESSALON ) 200 MG capsule      Meds ordered this encounter  Medications   azithromycin  (ZITHROMAX  Z-PAK)  250 MG tablet    Sig: Take 2 tablets (500 mg total) by mouth daily for 1 day, THEN 1 tablet (250 mg total) daily for 4 days until finished.    Dispense:  6 tablet    Refill:  0    Supervising Provider:   Christianna Cowman ALFRED [[id number]]   methylPREDNISolone  (MEDROL  DOSEPAK) 4 MG TBPK tablet    Sig: Take 6 tablets (24 mg total) by mouth daily for 1 day, THEN 5 tablets (20 mg total) daily for 1 day, THEN 4 tablets (16 mg total) daily for 1 day, THEN 3 tablets (12 mg total) daily for 1 day, THEN 2 tablets (8 mg total) daily for 1 day, THEN 1 tablet (4 mg total) daily for 1 day.    Dispense:  21 tablet    Refill:  0    Supervising Provider:   Christianna Cowman ALFRED [[id number]]   benzonatate  (TESSALON ) 200 MG capsule    Sig: Take 1 capsule (200 mg total) by mouth 3 (three) times daily as needed.    Dispense:  21 capsule    Refill:  0    Supervising Provider:   Tonna Frederic [[id number]]   Return if symptoms worsen or fail to improve.  Kathrene Parents, NP

## 2024-01-19 ENCOUNTER — Other Ambulatory Visit: Payer: Self-pay | Admitting: Allergy and Immunology

## 2024-01-19 ENCOUNTER — Other Ambulatory Visit (HOSPITAL_BASED_OUTPATIENT_CLINIC_OR_DEPARTMENT_OTHER): Payer: Self-pay

## 2024-01-19 ENCOUNTER — Other Ambulatory Visit: Payer: Self-pay

## 2024-01-19 MED ORDER — AIRSUPRA 90-80 MCG/ACT IN AERO
2.0000 | INHALATION_SPRAY | Freq: Four times a day (QID) | RESPIRATORY_TRACT | 2 refills | Status: DC | PRN
  Filled 2024-01-19: qty 10.7, 15d supply, fill #0
  Filled 2024-02-26: qty 10.7, 15d supply, fill #1
  Filled 2024-03-10: qty 10.7, 15d supply, fill #2

## 2024-01-29 ENCOUNTER — Other Ambulatory Visit: Payer: Self-pay

## 2024-02-07 ENCOUNTER — Ambulatory Visit: Admitting: Nurse Practitioner

## 2024-02-13 ENCOUNTER — Other Ambulatory Visit: Payer: Self-pay | Admitting: Family

## 2024-02-13 ENCOUNTER — Other Ambulatory Visit: Payer: Self-pay | Admitting: Family Medicine

## 2024-02-13 ENCOUNTER — Other Ambulatory Visit (HOSPITAL_BASED_OUTPATIENT_CLINIC_OR_DEPARTMENT_OTHER): Payer: Self-pay

## 2024-02-13 DIAGNOSIS — F908 Attention-deficit hyperactivity disorder, other type: Secondary | ICD-10-CM

## 2024-02-13 DIAGNOSIS — F411 Generalized anxiety disorder: Secondary | ICD-10-CM

## 2024-02-13 DIAGNOSIS — F4001 Agoraphobia with panic disorder: Secondary | ICD-10-CM

## 2024-02-13 MED ORDER — ALPRAZOLAM 1 MG PO TABS
1.0000 mg | ORAL_TABLET | Freq: Two times a day (BID) | ORAL | 2 refills | Status: DC | PRN
  Filled 2024-02-13: qty 20, 10d supply, fill #0
  Filled 2024-02-27: qty 20, 10d supply, fill #1

## 2024-02-13 MED ORDER — DIAZEPAM 10 MG PO TABS
10.0000 mg | ORAL_TABLET | Freq: Four times a day (QID) | ORAL | 2 refills | Status: AC | PRN
Start: 2024-02-13 — End: ?
  Filled 2024-02-13: qty 30, 8d supply, fill #0
  Filled 2024-02-27: qty 30, 8d supply, fill #1

## 2024-02-13 NOTE — Telephone Encounter (Signed)
 Refill request for  Alprazolam  1 mg LR 01/03/24,   Diazapam  10 mg LR 01/03/24, 30, 2 rf LOV  12/28/23 FOV  02/27/2024   Please review and advise.  Thanks.  Dm/cma

## 2024-02-14 ENCOUNTER — Other Ambulatory Visit: Payer: Self-pay

## 2024-02-14 ENCOUNTER — Other Ambulatory Visit (HOSPITAL_BASED_OUTPATIENT_CLINIC_OR_DEPARTMENT_OTHER): Payer: Self-pay

## 2024-02-15 ENCOUNTER — Other Ambulatory Visit: Payer: Self-pay | Admitting: Family Medicine

## 2024-02-15 ENCOUNTER — Other Ambulatory Visit (HOSPITAL_BASED_OUTPATIENT_CLINIC_OR_DEPARTMENT_OTHER): Payer: Self-pay

## 2024-02-15 DIAGNOSIS — F908 Attention-deficit hyperactivity disorder, other type: Secondary | ICD-10-CM

## 2024-02-15 NOTE — Telephone Encounter (Signed)
 Refill request for  Ritalin  10 mg LR  01/11/24, #90, 0n rf LOV  12/28/23 FOV  02/27/24  Please review and advise.  Thanks. Dm/cma

## 2024-02-16 ENCOUNTER — Other Ambulatory Visit (HOSPITAL_BASED_OUTPATIENT_CLINIC_OR_DEPARTMENT_OTHER): Payer: Self-pay

## 2024-02-16 MED ORDER — METHYLPHENIDATE HCL 10 MG PO TABS
10.0000 mg | ORAL_TABLET | Freq: Three times a day (TID) | ORAL | 0 refills | Status: DC
  Filled 2024-02-16: qty 90, 30d supply, fill #0

## 2024-02-27 ENCOUNTER — Encounter: Payer: Self-pay | Admitting: Family Medicine

## 2024-02-27 ENCOUNTER — Other Ambulatory Visit (HOSPITAL_BASED_OUTPATIENT_CLINIC_OR_DEPARTMENT_OTHER): Payer: Self-pay

## 2024-02-27 ENCOUNTER — Other Ambulatory Visit: Payer: Self-pay

## 2024-02-27 ENCOUNTER — Ambulatory Visit: Admitting: Family Medicine

## 2024-02-27 VITALS — BP 122/74 | HR 82 | Temp 97.8°F | Ht 62.0 in | Wt 161.6 lb

## 2024-02-27 DIAGNOSIS — F4001 Agoraphobia with panic disorder: Secondary | ICD-10-CM | POA: Diagnosis not present

## 2024-02-27 DIAGNOSIS — I10 Essential (primary) hypertension: Secondary | ICD-10-CM | POA: Diagnosis not present

## 2024-02-27 DIAGNOSIS — G43709 Chronic migraine without aura, not intractable, without status migrainosus: Secondary | ICD-10-CM | POA: Diagnosis not present

## 2024-02-27 DIAGNOSIS — F411 Generalized anxiety disorder: Secondary | ICD-10-CM | POA: Diagnosis not present

## 2024-02-27 MED ORDER — SUMATRIPTAN SUCCINATE 50 MG PO TABS
ORAL_TABLET | ORAL | 3 refills | Status: DC
  Filled 2024-03-27: qty 18, 30d supply, fill #0
  Filled 2024-04-24 (×2): qty 18, 30d supply, fill #1
  Filled 2024-05-29: qty 4, 20d supply, fill #2

## 2024-02-27 MED ORDER — DIAZEPAM 10 MG PO TABS
10.0000 mg | ORAL_TABLET | Freq: Four times a day (QID) | ORAL | 2 refills | Status: DC | PRN
Start: 2024-02-27 — End: 2024-04-05
  Filled 2024-02-27: qty 30, 8d supply, fill #0
  Filled 2024-03-10: qty 30, 8d supply, fill #1
  Filled 2024-03-21: qty 30, 8d supply, fill #2

## 2024-02-27 MED ORDER — ALPRAZOLAM 1 MG PO TABS
1.0000 mg | ORAL_TABLET | Freq: Two times a day (BID) | ORAL | 2 refills | Status: DC | PRN
  Filled 2024-02-27: qty 20, 10d supply, fill #0
  Filled 2024-03-10: qty 20, 10d supply, fill #1
  Filled 2024-03-21: qty 20, 10d supply, fill #2

## 2024-02-27 NOTE — Assessment & Plan Note (Signed)
 Blood pressure is at goal. Continue atenolol 25 mg 1/2 tab daily.

## 2024-02-27 NOTE — Assessment & Plan Note (Signed)
 Stable. Continue use of alprazolam for breakthrough panic attacks.

## 2024-02-27 NOTE — Progress Notes (Signed)
 [name] [address] [place] [address] Dept: [phone] Dept Fax: [phone]  Chronic Care Office Visit  Subjective:    Patient ID: Samantha [name], female    DOB: 02/05/1971, 53 y.o..   MRN: [id number]  Chief Complaint  Patient presents with   Hypertension    2 month f/u HTN.     History of Present Illness:  Patient is in today for reassessment of chronic medical issues.  Samantha [name] has a history of hypertension and is managed on atenolol  25 mg 1/2 tab daily.   Samantha [name] has a history of hyperlipidemia. She is managed on rosuvastatin  10 mg daily.   Samantha [name] has a history of asthma and allergy symptoms. She is followed by Dr. Fernande Howells (allergist).  She notes she has had several episodes of sinusitis and URI symptoms since her last visit. Currently, she feels these are in good control today. She notes she was treated with azithromycin  and a steroid for her sinusitis and developed a day's worth of itching. She is unsure if one of these drugs or their additives contributed.   Samantha [name] has multiple psychiatric issues including anxiety, PTSD, panic disorder, and ADHD. She is managed on diazepam  for baseline control of her anxiety and management of insomnia, and alprazolam  for breakthrough panic issues or acute anxiety. She is on methylphenidate  TID for management of her ADHD and daytime hypersomnia.   Samantha [name] notes a number of diffuse joint pains. She feels she is managing with these for now.  Past Medical History: Patient Active Problem List   Diagnosis Date Noted   COVID-19 long hauler 06/26/2023   Gross hematuria 04/27/2023   Renal mass 04/24/2023   Cervical spine arthritis 02/25/2022   Leg swelling 10/19/2021   Interstitial cystitis 09/15/2021   Other urethral stricture, female 09/15/2021   Chronic kidney disease, stage 3a (HCC) 08/20/2021   Prediabetes 08/27/2020   History of [place]  spotted fever 02/04/2020   Moderate persistent asthma without complication 08/02/2019   LPRD (laryngopharyngeal reflux disease) 08/02/2019   Chronic rhinitis 08/02/2019   Chronic post-traumatic stress disorder (PTSD) 11/21/2018   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) 11/21/2018   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/2018   Pulmonary nodules 11/13/2018   Other microscopic hematuria 11/06/2018   Generalized anxiety disorder 07/04/2018   Fecal incontinence 07/02/2018   Leg weakness 06/12/2017   Left ear hearing loss 02/07/2017   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   Borderline hyperlipidemia 11/08/2016   Acute idiopathic gout of multiple sites 11/08/2016   Elbow pain, chronic, right 10/06/2016   Lateral epicondylitis, right elbow 10/06/2016   Fibromyalgia 10/04/2016   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   Migraine headache 07/29/2016   Irritable bowel syndrome with both constipation and diarrhea 07/29/2016   Hives 07/18/2016   Urinary incontinence 06/02/2016   Atherosclerosis of abdominal aorta (HCC) 09/03/2015   Carpal tunnel syndrome 11/19/2014   Primary hypersomnia 11/18/2014   Insomnia 11/18/2014   Lumbar radicular pain 11/18/2014   Chronic pain syndrome 11/18/2014   Cervical disc disorder with radiculopathy 11/18/2014   Chronic female pelvic pain 07/31/2014   Degeneration of intervertebral disc of lumbosacral region 07/31/2014   Rosacea 07/31/2014   Morton's neuroma 07/31/2014   Benign paroxysmal positional vertigo 07/31/2014   Essential hypertension 07/31/2014   History of methicillin resistant Staphylococcus aureus infection 07/09/2014   Past Surgical History:  Procedure Laterality Date   BLADDER SURGERY  age 63- 49   CERVICAL FUSION     08/1998, 03/10/2017   CESAREAN SECTION     COLONOSCOPY     COMBINED HYSTEROSCOPY DIAGNOSTIC / D&C     CYSTO WITH HYDRODISTENSION N/A 02/22/2023   Procedure:  HYDRODISTENSION;  Surgeon: Mallie Seal, MD;  Location: [facility];  Service: Urology;  Laterality: N/A;   CYSTOSCOPY W/ RETROGRADES N/A 02/22/2023   Procedure: CYSTOSCOPY WITH BILATERAL RETROGRADE PYELOGRAM;  Surgeon: Mallie Seal, MD;  Location: [facility];  Service: Urology;  Laterality: N/A;  60 MINUTES NEEDED FOR CASE   CYSTOSCOPY WITH URETHRAL DILATATION N/A 02/22/2023   Procedure: URETHRAL DILATATION;  Surgeon: Mallie Seal, MD;  Location: [facility];  Service: Urology;  Laterality: N/A;   HAND SURGERY     2012, 2013   LAPAROSCOPIC ENDOMETRIOSIS FULGURATION Right    LESION REMOVAL  08/23/2012   Procedure: LESION REMOVAL HAND;  Surgeon: Amelie Baize., MD;  Location: [facility];  Service: Orthopedics;  Laterality: Left;   ROTATOR CUFF REPAIR     SPINE SURGERY  [phone]   UPPER GASTROINTESTINAL ENDOSCOPY     Family History  Problem Relation Age of Onset   Asthma Mother    Clotting disorder Mother    Heart attack Mother    Hypertension Mother    Cancer Father        Multiple myeloma   Anemia Father    Arrhythmia Father    Heart attack Father    Heart disease Father    Hyperlipidemia Father    Hypertension Father    ADD / ADHD Brother    Arthritis Brother    Learning disabilities Brother    Stomach cancer Maternal Grandfather    Asthma Maternal Grandfather    Heart attack Maternal Grandfather    Cancer Maternal Grandmother    Kidney disease Maternal Grandmother    Heart attack Paternal Grandfather    Diabetes Paternal Grandmother    ADD / ADHD Son    Anxiety disorder Son    Hearing loss Son    Learning disabilities Son    Cancer Paternal Uncle    COPD Paternal Uncle    Colon cancer Neg Hx    Esophageal cancer Neg Hx    Rectal cancer Neg Hx    Outpatient Medications Prior to Visit  Medication Sig Dispense Refill   AIRSUPRA  90-80 MCG/ACT AERO Inhale 2 puffs into the lungs every 6 (six) hours as needed. 10.7 g 2   albuterol  (VENTOLIN  HFA) 108  (90 Base) MCG/ACT inhaler INHALE 2 PUFFS BY MOUTH EVERY 4 HOURS AS NEEDED FOR WHEEZING FOR SHORTNESS OF BREATH 18 g 0   Alum Hydroxide-Mag Trisilicate (GAVISCON) 80-14.2 MG CHEW Chew 2 tablets by mouth as needed (acid reflux).     aspirin EC 81 MG tablet Take 81 mg by mouth daily. Swallow whole.     atenolol  (TENORMIN ) 25 MG tablet Take 0.5 tablets (12.5 mg total) by mouth daily. 30 tablet 0   AUVI-Q  0.3 MG/0.3ML SOAJ injection Inject 0.3 mg into the muscle as needed for anaphylaxis. As directed for life-threatening allergic reactions 2 each 1   azelastine  (ASTELIN ) 0.1 % nasal spray Place 2 sprays into each nostril 1-2 times per day. 30 mL 5   benzonatate  (TESSALON ) 200 MG capsule Take 1 capsule (200 mg total) by mouth 3 (three) times daily as needed. 21 capsule 0   budesonide  (PULMICORT ) 0.25 MG/2ML nebulizer solution Take 0.25 mg by nebulization 2 (two) times  daily as needed (Asthma).     Cholecalciferol (VITAMIN D ) 50 MCG (2000 UT) CAPS Take 2,000 Units by mouth daily.     cimetidine (TAGAMET) 200 MG tablet Take 200 mg by mouth 2 (two) times daily.     EPINEPHrine  (PRIMATENE  MIST) 0.125 MG/ACT AERO Inhale 1 puff into the lungs as needed (Asthma).     estradiol  (ESTRACE ) 0.1 MG/GM vaginal cream Place 1 gram vaginally 2 (two) times a week at bedtime for 90 days. 42.5 g 3   famotidine (PEPCID AC MAXIMUM STRENGTH) 20 MG tablet Take 20 mg by mouth daily as needed (Hives/itching).     guaifenesin (HUMIBID E) 400 MG TABS tablet Take 400-800 mg by mouth every 4 (four) hours as needed (Cough/mucus).     ipratropium (ATROVENT ) 0.06 % nasal spray Place 2 sprays into each nostril 1-2 times per day. 15 mL 5   levalbuterol  (XOPENEX  HFA) 45 MCG/ACT inhaler Inhale 2 puffs into the lungs every 6 (six) hours as needed for wheezing. 15 g 1   magnesium oxide (MAG-OX) 400 (240 Mg) MG tablet Take 400 mg by mouth daily.     methylphenidate  (RITALIN ) 10 MG tablet Take 1 tablet (10 mg total) by mouth 3 (three) times  daily with meals. 90 tablet 0   Olopatadine HCl (PATADAY) 0.2 % SOLN Place 1 drop into both eyes 2 (two) times daily as needed (eye allergies).     rosuvastatin  (CRESTOR ) 10 MG tablet Take 1 tablet by mouth once daily 90 tablet 3   ALPRAZolam  (XANAX ) 1 MG tablet Take 1 tablet (1 mg total) by mouth 2 (two) times daily as needed. 20 tablet 2   diazepam  (VALIUM ) 10 MG tablet Take 1 tablet (10 mg total) by mouth every 6 (six) hours as needed for anxiety or sleep (vertigo/muscle spasms). 30 tablet 2   SUMAtriptan  (IMITREX ) 50 MG tablet Take 1 tablet (50 mg total) by mouth once as needed. May repeat in 2 hours if headache persists or recurs. 10 tablet 3   diphenhydrAMINE  (BENADRYL ) 12.5 MG chewable tablet Chew 25 mg by mouth 4 (four) times daily as needed for allergies.     No facility-administered medications prior to visit.   Allergies  Allergen Reactions   Acetaminophen  Rash    Blistering lymph nodes, and blistering rash    Airduo Digihaler  [Fluticasone -Salmeterol] Hives, Shortness Of Breath and Itching    Asthma attack   Allegra  [Fexofenadine ] Cough    She reports tachycardia and elevated BP and low Oxygen level after taking this   Aspirin Shortness Of Breath    High dose brings asthma attack    B-12 [Cyanocobalamin ] Shortness Of Breath and Itching    Drop O2   Baclofen  Other (See Comments)    Headache, muscle weakness, unable to urinate, unable to walk    Bactrim Anaphylaxis   Bee Venom Hives, Itching, Other (See Comments) and Rash   Benadryl  [Diphenhydramine ] Anaphylaxis    Only the liquid form she can not take because it causes throat closing,  DO NOT USE IN IV FORM - tablets are ok   Ciprofloxacin Shortness Of Breath    Blurred Vision, joint pain, headaches, dropped BP   Clindamycin Shortness Of Breath   Coconut (Cocos Nucifera) Hives, Itching and Other (See Comments)    Coconut Oil    Coq-10 [Coenzyme Q10] Hives, Shortness Of Breath and Itching   Cymbalta [Duloxetine Hcl]  Anaphylaxis and Shortness Of Breath   Darvon [Propoxyphene] Hives, Shortness Of Breath and Itching  Darvocet*   Flunisolide  Shortness Of Breath and Other (See Comments)    Aerospan  Nasal Inhaler* - caused blood clots in nose    Fluticasone  Furoate Anaphylaxis, Cough, Nausea And Vomiting, Palpitations and Shortness Of Breath   Formaldehyde Anaphylaxis   Hydrocodone-Acetaminophen  Hives, Shortness Of Breath and Itching    Vicodin, Norco   Nucynta [Tapentadol] Shortness Of Breath    Coughing, SOB    Other Hives, Itching, Rash and Other (See Comments)    Insect stings/ Bites  Wine spirit  Peanuts - Bring on Asthma attack   Cold Temperatures  - causes hives, and muscle spasms    Peanut-Containing Drug Products Hives, Shortness Of Breath, Itching, Swelling and Other (See Comments)   Potassium-Containing Compounds Anaphylaxis, Itching, Swelling and Palpitations    Tables, and Oral Solution    Ramipril Anaphylaxis   Skelaxin  [Metaxalone ] Hives, Shortness Of Breath and Itching    Brings on Asthma Attacks    Sulfa Antibiotics Anaphylaxis   Sulfamethoxazole-Trimethoprim Anaphylaxis   Tezspire  [Tezepelumab ] Shortness Of Breath, Nausea And Vomiting, Swelling, Rash and Other (See Comments)    Muscle weakness, joint pain, sore throat   Triamcinolone  Other (See Comments)    Kenalog  Injection - muscle weakness, spasms, BP drop, lethargic, tongue swelling   Nasacort  causes breathing issues    Voltaren [Diclofenac] Shortness Of Breath, Swelling and Rash    Gel*    Zofran  [Ondansetron  Hcl] Hives and Itching    Blurred Vision    Aspartame Other (See Comments)    Severe Migraines   Bacitra-Neomycin-Polymyxin-Hc Rash   Cranberry Extract Other (See Comments)    Throat itching / burning    Sulfur Hives and Itching   Aciphex  [Rabeprazole ]     Muscle weakness, headaches    Addyi [Flibanserin]     Decreased sex drive to 0   Alvesco  [Ciclesonide ] Other (See Comments)    Sores in mouth, coughing     Ambien [Zolpidem]     Mood Change, Aggressive, Insomnia    Amoxicillin      Pt unsure of Allergy    Ativan [Lorazepam] Other (See Comments)    Hallucination     Aygestin [Norethindrone]     Muscle weakness, abdominal pain   Celebrex [Celecoxib] Other (See Comments)    Possible Reaction - Not taken    Demeclocycline     Tetracycline Family-  GI Upset   Dexilant [Dexlansoprazole]     Severe joint pain    Doxycycline     Possibly joint pain or itch    Effexor Xr [Venlafaxine Hcl Er]     Migraines, weight gain, trouble sleeping   Elavil [Amitriptyline] Nausea And Vomiting    Headache, and muscle weakness   Erythromycin     Upset stomach   Erythromycin Base     Stomach Upset    Fentanyl  Dermatitis    Patches*    Fioricet [Butalbital-Apap-Caffeine]     Acetaminophen     Hydrocodone Itching   Hydrocodone Bit-Homatrop Mbr Hives and Itching    Not true allergy. irritated   Hydromorphone  Nausea Only   Inderal [Propranolol]     Agitation, difficulty sleeping    Indomethacin Hives and Itching   Latex Itching    Skin Peeling    Levofloxacin     Weakness, myalgia   Lyrica [Pregabalin]     Muscle weakness, Causes Falls    Maxalt [Rizatriptan] Other (See Comments)    Pt was knocked out from med, extreme drowsiness and did not help migraine  Meloxicam Itching    Increases BP   Morphine  Sulfate Er     Severe insomnia, does not help pain    Naproxen Hives and Itching    Head to Toe    Neurontin [Gabapentin]     Depression    Nexium [Esomeprazole]     Bad headaches    Nitrofuran Derivatives    Nortriptyline     Depression, decreased sex drive    Omeprazole-Sodium Bicarbonate     Chest pain-sore all over-HA   Ranitidine     Severe joint pain   Robaxin  [Methocarbamol ]     Made her very tired and unable to sleep at night Muscle weakness, fatigue    Tape Dermatitis   Tetanus Toxoid Other (See Comments)    Tetanus injection - blistering rash, swelling at injection site,  turn into baseball size lump, joint pain    Tetanus-Diphtheria Toxoids Td Hives    Robust local reaction   Tetracyclines & Related     GI Upset    Tizanidine Other (See Comments)    knocked her out- had a rebound migraine, causes muscles to lock, and bedridden    Topamax [Topiramate]     Lethargic, daytime drowsiness, insomnia    Trazodone     Pt thinks it did not help - unsure of reaction    Tums [Calcium  Carbonate] Other (See Comments)    Blistering Rash with high doses    Wound Dressing Adhesive Itching and Dermatitis    Bandages    Etodolac  Rash    Blistering Rash, and swollen lymph nodes    Flexeril  [Cyclobenzaprine ] Palpitations    Irregular heartbeat    Ibuprofen Itching, Hives, Rash and Swelling    Throat itching   Neosporin [Neomycin-Bacitracin Zn-Polymyx] Rash    Blistering    Objective:   Today's Vitals   02/27/24 1309  BP: 122/74  Pulse: 82  Temp: 97.8 F (36.6 C)  TempSrc: Temporal  SpO2: 98%  Weight: 161 lb 9.6 oz (73.3 kg)  Height: 5' 2 (1.575 m)   Body mass index is 29.56 kg/m.   General: Well developed, well nourished. No acute distress. Psych: Alert and oriented. Normal mood and affect.  Health Maintenance Due  Topic Date Due   HIV Screening  Never done   Pneumococcal Vaccine 21-73 Years old (1 of 2 - PCV) Never done   Zoster Vaccines- Shingrix (1 of 2) Never done     Assessment & Plan:   Problem List Items Addressed This Visit       Cardiovascular and Mediastinum   Essential hypertension - Primary   Blood pressure is at goal. Continue atenolol  25 mg 1/2 tab daily.      Migraine headache   Stable. Continue sumatriptan  (Imitrex ) for breakthrough.      Relevant Medications   SUMAtriptan  (IMITREX ) 50 MG tablet     Other   Generalized anxiety disorder   Stable. Overall improved control of anxiety. Continue diazepam  for baseline control and alprazolam  as needed for panic attacks.      Relevant Medications   ALPRAZolam  (XANAX ) 1  MG tablet   diazepam  (VALIUM ) 10 MG tablet   Panic disorder with agoraphobia   Stable. Continue use of alprazolam  for breakthrough panic attacks.      Relevant Medications   ALPRAZolam  (XANAX ) 1 MG tablet   diazepam  (VALIUM ) 10 MG tablet    Return in about 2 months (around 04/28/2024) for Reassessment.   Graig Lawyer, MD

## 2024-02-27 NOTE — Assessment & Plan Note (Signed)
Stable. Overall improved control of anxiety. Continue diazepam for baseline control and alprazolam as needed for panic attacks.

## 2024-02-27 NOTE — Assessment & Plan Note (Signed)
 Stable. Continue sumatriptan (Imitrex) for breakthrough.

## 2024-03-10 ENCOUNTER — Other Ambulatory Visit: Payer: Self-pay | Admitting: Family

## 2024-03-10 DIAGNOSIS — I10 Essential (primary) hypertension: Secondary | ICD-10-CM

## 2024-03-11 ENCOUNTER — Other Ambulatory Visit (HOSPITAL_BASED_OUTPATIENT_CLINIC_OR_DEPARTMENT_OTHER): Payer: Self-pay

## 2024-03-11 ENCOUNTER — Other Ambulatory Visit: Payer: Self-pay

## 2024-03-12 ENCOUNTER — Other Ambulatory Visit (HOSPITAL_COMMUNITY): Payer: Self-pay

## 2024-03-12 ENCOUNTER — Other Ambulatory Visit: Payer: Self-pay | Admitting: Family Medicine

## 2024-03-12 ENCOUNTER — Encounter (HOSPITAL_BASED_OUTPATIENT_CLINIC_OR_DEPARTMENT_OTHER): Payer: Self-pay

## 2024-03-12 ENCOUNTER — Other Ambulatory Visit (HOSPITAL_BASED_OUTPATIENT_CLINIC_OR_DEPARTMENT_OTHER): Payer: Self-pay

## 2024-03-12 DIAGNOSIS — I10 Essential (primary) hypertension: Secondary | ICD-10-CM

## 2024-03-12 MED ORDER — ATENOLOL 25 MG PO TABS
12.5000 mg | ORAL_TABLET | Freq: Every day | ORAL | 1 refills | Status: AC
  Filled 2024-03-12: qty 45, 90d supply, fill #0
  Filled 2024-06-05: qty 45, 90d supply, fill #1
  Filled 2024-09-06: qty 45, 90d supply, fill #2

## 2024-03-19 LAB — LAB REPORT - SCANNED: EGFR: 80

## 2024-03-21 ENCOUNTER — Other Ambulatory Visit: Payer: Self-pay | Admitting: Nephrology

## 2024-03-21 ENCOUNTER — Other Ambulatory Visit: Payer: Self-pay

## 2024-03-21 DIAGNOSIS — R3129 Other microscopic hematuria: Secondary | ICD-10-CM

## 2024-03-27 ENCOUNTER — Other Ambulatory Visit: Payer: Self-pay | Admitting: Allergy and Immunology

## 2024-03-27 ENCOUNTER — Other Ambulatory Visit: Payer: Self-pay | Admitting: Family Medicine

## 2024-03-27 ENCOUNTER — Other Ambulatory Visit (HOSPITAL_BASED_OUTPATIENT_CLINIC_OR_DEPARTMENT_OTHER): Payer: Self-pay

## 2024-03-27 ENCOUNTER — Other Ambulatory Visit: Payer: Self-pay

## 2024-03-27 ENCOUNTER — Encounter: Payer: Self-pay | Admitting: Allergy and Immunology

## 2024-03-27 ENCOUNTER — Other Ambulatory Visit (HOSPITAL_COMMUNITY): Payer: Self-pay

## 2024-03-27 DIAGNOSIS — F908 Attention-deficit hyperactivity disorder, other type: Secondary | ICD-10-CM

## 2024-03-27 MED ORDER — AUVI-Q 0.3 MG/0.3ML IJ SOAJ
0.3000 mg | INTRAMUSCULAR | 1 refills | Status: DC | PRN
  Filled 2024-03-27: qty 2, 1d supply, fill #0
  Filled 2024-07-17: qty 2, 1d supply, fill #1

## 2024-03-27 MED ORDER — AIRSUPRA 90-80 MCG/ACT IN AERO
2.0000 | INHALATION_SPRAY | Freq: Four times a day (QID) | RESPIRATORY_TRACT | 2 refills | Status: DC | PRN
  Filled 2024-03-27: qty 10.7, 15d supply, fill #0
  Filled 2024-04-16: qty 10.7, 15d supply, fill #1
  Filled 2024-07-17: qty 10.7, 15d supply, fill #2

## 2024-03-28 ENCOUNTER — Other Ambulatory Visit (HOSPITAL_BASED_OUTPATIENT_CLINIC_OR_DEPARTMENT_OTHER): Payer: Self-pay

## 2024-03-28 MED ORDER — METHYLPHENIDATE HCL 10 MG PO TABS
10.0000 mg | ORAL_TABLET | Freq: Three times a day (TID) | ORAL | 0 refills | Status: DC
  Filled 2024-03-28: qty 90, 30d supply, fill #0

## 2024-03-28 NOTE — Telephone Encounter (Signed)
 Refill requests for Ritalin  10 mg LR 02/16/24, #90, 0 rf LOV  02/27/24 FOV  04/29/24  Please review and advise.  Thanks. Dm/cma

## 2024-04-05 ENCOUNTER — Other Ambulatory Visit (HOSPITAL_BASED_OUTPATIENT_CLINIC_OR_DEPARTMENT_OTHER): Payer: Self-pay

## 2024-04-05 ENCOUNTER — Other Ambulatory Visit: Payer: Self-pay | Admitting: Family Medicine

## 2024-04-05 DIAGNOSIS — F411 Generalized anxiety disorder: Secondary | ICD-10-CM

## 2024-04-05 DIAGNOSIS — F4001 Agoraphobia with panic disorder: Secondary | ICD-10-CM

## 2024-04-05 MED ORDER — DIAZEPAM 10 MG PO TABS
10.0000 mg | ORAL_TABLET | Freq: Four times a day (QID) | ORAL | 2 refills | Status: DC | PRN
  Filled 2024-04-05: qty 30, 8d supply, fill #0
  Filled 2024-04-16: qty 30, 8d supply, fill #1
  Filled 2024-04-24: qty 30, 8d supply, fill #2

## 2024-04-05 MED ORDER — ALPRAZOLAM 1 MG PO TABS
1.0000 mg | ORAL_TABLET | Freq: Two times a day (BID) | ORAL | 2 refills | Status: DC | PRN
Start: 2024-04-05 — End: 2024-05-05
  Filled 2024-04-05: qty 20, 10d supply, fill #0
  Filled 2024-04-16: qty 20, 10d supply, fill #1
  Filled 2024-04-24 (×2): qty 20, 10d supply, fill #2

## 2024-04-05 NOTE — Telephone Encounter (Signed)
 Medication: Xanax  & Valium  Directions: Xan -  ALPRAZolam  (XANAX ) 1 MG tablet Take 1 tablet (1 mg total) by mouth 2 (two) times daily as needed   Valium - Take 1 tablet (10 mg total) by mouth every 6 (six) hours as needed for anxiety or sleep   Last given: 02/27/2024 Number refills: 2, 2 Last o/v: 02/27/2024 Follow up: 04/28/2024 Labs: 12/28/2023

## 2024-04-16 ENCOUNTER — Other Ambulatory Visit: Payer: Self-pay

## 2024-04-24 ENCOUNTER — Other Ambulatory Visit: Payer: Self-pay

## 2024-04-24 ENCOUNTER — Other Ambulatory Visit: Payer: Self-pay | Admitting: Allergy and Immunology

## 2024-04-24 ENCOUNTER — Other Ambulatory Visit (HOSPITAL_BASED_OUTPATIENT_CLINIC_OR_DEPARTMENT_OTHER): Payer: Self-pay

## 2024-04-24 ENCOUNTER — Other Ambulatory Visit: Payer: Self-pay | Admitting: Family Medicine

## 2024-04-24 DIAGNOSIS — F908 Attention-deficit hyperactivity disorder, other type: Secondary | ICD-10-CM

## 2024-04-24 MED ORDER — METHYLPHENIDATE HCL 10 MG PO TABS
10.0000 mg | ORAL_TABLET | Freq: Three times a day (TID) | ORAL | 0 refills | Status: DC
  Filled 2024-04-24 – 2024-04-25 (×3): qty 90, 30d supply, fill #0
  Filled ????-??-??: fill #0

## 2024-04-25 ENCOUNTER — Other Ambulatory Visit (HOSPITAL_BASED_OUTPATIENT_CLINIC_OR_DEPARTMENT_OTHER): Payer: Self-pay

## 2024-04-25 ENCOUNTER — Other Ambulatory Visit: Payer: Self-pay

## 2024-04-26 ENCOUNTER — Other Ambulatory Visit (HOSPITAL_BASED_OUTPATIENT_CLINIC_OR_DEPARTMENT_OTHER): Payer: Self-pay

## 2024-04-26 MED ORDER — IPRATROPIUM BROMIDE 0.06 % NA SOLN
NASAL | 5 refills | Status: DC
  Filled 2024-04-26: qty 15, 30d supply, fill #0

## 2024-04-29 ENCOUNTER — Ambulatory Visit: Admitting: Family Medicine

## 2024-04-29 ENCOUNTER — Encounter: Payer: Self-pay | Admitting: Family Medicine

## 2024-04-29 VITALS — BP 136/80 | HR 57 | Temp 98.5°F | Ht 62.0 in | Wt 160.0 lb

## 2024-04-29 DIAGNOSIS — I10 Essential (primary) hypertension: Secondary | ICD-10-CM

## 2024-04-29 DIAGNOSIS — G43709 Chronic migraine without aura, not intractable, without status migrainosus: Secondary | ICD-10-CM

## 2024-04-29 DIAGNOSIS — L723 Sebaceous cyst: Secondary | ICD-10-CM | POA: Diagnosis not present

## 2024-04-29 NOTE — Assessment & Plan Note (Signed)
 Samantha [name] blood pressure is borderline high today, likely due to her headache. Continue atenolol  25 mg 1/2 tab daily.

## 2024-04-29 NOTE — Progress Notes (Signed)
 [facility] [address] [place] [address] Dept: [phone] Dept Fax: [phone]  Chronic Care Office Visit  Subjective:    Patient ID: Samantha [name], female    DOB: 06/16/1971, 53 y.o..   MRN: [id number]  Chief Complaint  Patient presents with   Hypertension    2 month f/u.  Having a HA now.     History of Present Illness:  Patient is in today for reassessment of chronic medical issues. However, she did develop a typical migraine headache as she was leaving home. Her husband drove her to her appointment. She notes the sunlight makes her headache much worse. She did not try taking her sumatriptan  before leaving home.   [name] has a history of hypertension and is managed on atenolol  25 mg 1/2 tab daily. She notes her pressure was up this morning. She is unsure if this relates to her headache onset or not.   [name] notes she has a lump on her back that flares up periodically. This will fill up with a white material that she later can express out. When flared, this can be quite painful.  Past Medical History: Patient Active Problem List   Diagnosis Date Noted   COVID-19 long hauler 06/26/2023   Gross hematuria 04/27/2023   Renal mass 04/24/2023   Cervical spine arthritis 02/25/2022   Leg swelling 10/19/2021   Interstitial cystitis 09/15/2021   Other urethral stricture, female 09/15/2021   Chronic kidney disease, stage 3a (HCC) 08/20/2021   Prediabetes 08/27/2020   History of [place] spotted fever 02/04/2020   Moderate persistent asthma without complication 08/02/2019   LPRD (laryngopharyngeal reflux disease) 08/02/2019   Chronic rhinitis 08/02/2019   Chronic post-traumatic stress disorder (PTSD) 11/21/2018   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) 11/21/2018   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/2018   Pulmonary nodules 11/13/2018   Other microscopic  hematuria 11/06/2018   Generalized anxiety disorder 07/04/2018   Fecal incontinence 07/02/2018   Leg weakness 06/12/2017   Left ear hearing loss 02/07/2017   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   Borderline hyperlipidemia 11/08/2016   Acute idiopathic gout of multiple sites 11/08/2016   Elbow pain, chronic, right 10/06/2016   Lateral epicondylitis, right elbow 10/06/2016   Fibromyalgia 10/04/2016   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   Migraine headache 07/29/2016   Irritable bowel syndrome with both constipation and diarrhea 07/29/2016   Hives 07/18/2016   Urinary incontinence 06/02/2016   Atherosclerosis of abdominal aorta (HCC) 09/03/2015   Carpal tunnel syndrome 11/19/2014   Primary hypersomnia 11/18/2014   Insomnia 11/18/2014   Lumbar radicular pain 11/18/2014   Chronic pain syndrome 11/18/2014   Cervical disc disorder with radiculopathy 11/18/2014   Chronic female pelvic pain 07/31/2014   Degeneration of intervertebral disc of lumbosacral region 07/31/2014   Rosacea 07/31/2014   Morton's neuroma 07/31/2014   Benign paroxysmal positional vertigo 07/31/2014   Essential hypertension 07/31/2014   History of methicillin resistant Staphylococcus aureus infection 07/09/2014   Past Surgical History:  Procedure Laterality Date   BLADDER SURGERY     age 41- 61   CERVICAL FUSION     08/1998, 03/10/2017   CESAREAN SECTION     COLONOSCOPY     COMBINED HYSTEROSCOPY DIAGNOSTIC / D&C     CYSTO WITH HYDRODISTENSION N/A 02/22/2023   Procedure: HYDRODISTENSION;  Surgeon: Lovie Arlyss CROME, MD;  Location: [facility];  Service: Urology;  Laterality: N/A;   CYSTOSCOPY W/ RETROGRADES  N/A 02/22/2023   Procedure: CYSTOSCOPY WITH BILATERAL RETROGRADE PYELOGRAM;  Surgeon: Lovie Arlyss CROME, MD;  Location: [facility];  Service: Urology;  Laterality: N/A;  60 MINUTES NEEDED FOR CASE   CYSTOSCOPY WITH URETHRAL DILATATION N/A 02/22/2023   Procedure: URETHRAL DILATATION;  Surgeon:  Lovie Arlyss CROME, MD;  Location: [facility];  Service: Urology;  Laterality: N/A;   HAND SURGERY     2012, 2013   LAPAROSCOPIC ENDOMETRIOSIS FULGURATION Right    LESION REMOVAL  08/23/2012   Procedure: LESION REMOVAL HAND;  Surgeon: Lamar LULLA Leonor Mickey., MD;  Location: [facility];  Service: Orthopedics;  Laterality: Left;   ROTATOR CUFF REPAIR     SPINE SURGERY  [phone]   UPPER GASTROINTESTINAL ENDOSCOPY     Family History  Problem Relation Age of Onset   Asthma Mother    Clotting disorder Mother    Heart attack Mother    Hypertension Mother    Cancer Father        Multiple myeloma   Anemia Father    Arrhythmia Father    Heart attack Father    Heart disease Father    Hyperlipidemia Father    Hypertension Father    ADD / ADHD Brother    Arthritis Brother    Learning disabilities Brother    Stomach cancer Maternal Grandfather    Asthma Maternal Grandfather    Heart attack Maternal Grandfather    Cancer Maternal Grandmother    Kidney disease Maternal Grandmother    Heart attack Paternal Grandfather    Diabetes Paternal Grandmother    ADD / ADHD Son    Anxiety disorder Son    Hearing loss Son    Learning disabilities Son    Cancer Paternal Uncle    COPD Paternal Uncle    Colon cancer Neg Hx    Esophageal cancer Neg Hx    Rectal cancer Neg Hx    Outpatient Medications Prior to Visit  Medication Sig Dispense Refill   AIRSUPRA  90-80 MCG/ACT AERO Inhale 2 puffs into the lungs every 6 (six) hours as needed. 10.7 g 2   albuterol  (VENTOLIN  HFA) 108 (90 Base) MCG/ACT inhaler INHALE 2 PUFFS BY MOUTH EVERY 4 HOURS AS NEEDED FOR WHEEZING FOR SHORTNESS OF BREATH 18 g 0   ALPRAZolam  (XANAX ) 1 MG tablet Take 1 tablet (1 mg total) by mouth 2 (two) times daily as needed. 20 tablet 2   Alum Hydroxide-Mag Trisilicate (GAVISCON) 80-14.2 MG CHEW Chew 2 tablets by mouth as needed (acid reflux).     aspirin EC 81 MG tablet Take 81 mg by mouth daily. Swallow whole.     atenolol   (TENORMIN ) 25 MG tablet Take 0.5 tablets (12.5 mg total) by mouth daily. 90 tablet 1   AUVI-Q  0.3 MG/0.3ML SOAJ injection Inject 0.3 mg into the muscle as needed for anaphylaxis. As directed for life-threatening allergic reactions 2 each 1   azelastine  (ASTELIN ) 0.1 % nasal spray Place 2 sprays into each nostril 1-2 times per day. 30 mL 5   benzonatate  (TESSALON ) 200 MG capsule Take 1 capsule (200 mg total) by mouth 3 (three) times daily as needed. 21 capsule 0   budesonide  (PULMICORT ) 0.25 MG/2ML nebulizer solution Take 0.25 mg by nebulization 2 (two) times daily as needed (Asthma).     Cholecalciferol (VITAMIN D ) 50 MCG (2000 UT) CAPS Take 2,000 Units by mouth daily.     cimetidine (TAGAMET) 200 MG tablet Take 200 mg by mouth 2 (two) times daily.  diazepam  (VALIUM ) 10 MG tablet Take 1 tablet (10 mg total) by mouth every 6 (six) hours as needed for anxiety or sleep (vertigo/muscle spasms). 30 tablet 2   EPINEPHrine  (PRIMATENE  MIST) 0.125 MG/ACT AERO Inhale 1 puff into the lungs as needed (Asthma).     estradiol  (ESTRACE ) 0.1 MG/GM vaginal cream Place 1 gram vaginally 2 (two) times a week at bedtime for 90 days. 42.5 g 3   famotidine (PEPCID AC MAXIMUM STRENGTH) 20 MG tablet Take 20 mg by mouth daily as needed (Hives/itching).     guaifenesin (HUMIBID E) 400 MG TABS tablet Take 400-800 mg by mouth every 4 (four) hours as needed (Cough/mucus).     ipratropium (ATROVENT ) 0.06 % nasal spray Place 2 sprays into each nostril 1-2 times per day. 15 mL 5   levalbuterol  (XOPENEX  HFA) 45 MCG/ACT inhaler Inhale 2 puffs into the lungs every 6 (six) hours as needed for wheezing. 15 g 1   magnesium oxide (MAG-OX) 400 (240 Mg) MG tablet Take 400 mg by mouth daily.     methylphenidate  (RITALIN ) 10 MG tablet Take 1 tablet (10 mg total) by mouth 3 (three) times daily with meals. 90 tablet 0   Olopatadine HCl (PATADAY) 0.2 % SOLN Place 1 drop into both eyes 2 (two) times daily as needed (eye allergies).      rosuvastatin  (CRESTOR ) 10 MG tablet Take 1 tablet by mouth once daily 90 tablet 3   SUMAtriptan  (IMITREX ) 50 MG tablet Take 1 tablet (50 mg total) by mouth once as needed. May repeat in 2 hours if headache persists or recurs. 10 tablet 3   No facility-administered medications prior to visit.   Allergies  Allergen Reactions   Acetaminophen  Rash    Blistering lymph nodes, and blistering rash    Airduo Digihaler  [Fluticasone -Salmeterol] Hives, Shortness Of Breath and Itching    Asthma attack   Allegra  [Fexofenadine ] Cough    She reports tachycardia and elevated BP and low Oxygen level after taking this   Aspirin Shortness Of Breath    High dose brings asthma attack    B-12 [Cyanocobalamin ] Shortness Of Breath and Itching    Drop O2   Baclofen  Other (See Comments)    Headache, muscle weakness, unable to urinate, unable to walk    Bactrim Anaphylaxis   Bee Venom Hives, Itching, Other (See Comments) and Rash   Benadryl  [Diphenhydramine ] Anaphylaxis    Only the liquid form she can not take because it causes throat closing,  DO NOT USE IN IV FORM - tablets are ok   Ciprofloxacin Shortness Of Breath    Blurred Vision, joint pain, headaches, dropped BP   Clindamycin Shortness Of Breath   Coconut (Cocos Nucifera) Hives, Itching and Other (See Comments)    Coconut Oil    Coq-10 [Coenzyme Q10] Hives, Shortness Of Breath and Itching   Cymbalta [Duloxetine Hcl] Anaphylaxis and Shortness Of Breath   Darvon [Propoxyphene] Hives, Shortness Of Breath and Itching    Darvocet*   Estradiol  Rash and Nausea Only    Lethargy, abdominal and joint pain, nausea, and rash with Estradiol  vaginal cream   Flunisolide  Shortness Of Breath and Other (See Comments)    Aerospan  Nasal Inhaler* - caused blood clots in nose    Fluticasone  Furoate Anaphylaxis, Cough, Nausea And Vomiting, Palpitations and Shortness Of Breath   Formaldehyde Anaphylaxis   Hydrocodone-Acetaminophen  Hives, Shortness Of Breath and Itching     Vicodin, Norco   Nucynta [Tapentadol] Shortness Of Breath    Coughing,  SOB    Other Hives, Itching, Rash and Other (See Comments)    Insect stings/ Bites  Wine spirit  Peanuts - Bring on Asthma attack   Cold Temperatures  - causes hives, and muscle spasms    Peanut-Containing Drug Products Hives, Shortness Of Breath, Itching, Swelling and Other (See Comments)   Potassium-Containing Compounds Anaphylaxis, Itching, Swelling and Palpitations    Tables, and Oral Solution    Ramipril Anaphylaxis   Skelaxin  [Metaxalone ] Hives, Shortness Of Breath and Itching    Brings on Asthma Attacks    Sulfa Antibiotics Anaphylaxis   Sulfamethoxazole-Trimethoprim Anaphylaxis   Tezspire  [Tezepelumab ] Shortness Of Breath, Nausea And Vomiting, Swelling, Rash and Other (See Comments)    Muscle weakness, joint pain, sore throat   Triamcinolone  Other (See Comments)    Kenalog  Injection - muscle weakness, spasms, BP drop, lethargic, tongue swelling   Nasacort  causes breathing issues    Voltaren [Diclofenac] Shortness Of Breath, Swelling and Rash    Gel*    Zofran  [Ondansetron  Hcl] Hives and Itching    Blurred Vision    Aspartame Other (See Comments)    Severe Migraines   Bacitra-Neomycin-Polymyxin-Hc Rash   Cranberry Extract Other (See Comments)    Throat itching / burning    Sulfur Hives and Itching   Aciphex  [Rabeprazole ]     Muscle weakness, headaches    Addyi [Flibanserin]     Decreased sex drive to 0   Alvesco  [Ciclesonide ] Other (See Comments)    Sores in mouth, coughing    Ambien [Zolpidem]     Mood Change, Aggressive, Insomnia    Amoxicillin      Pt unsure of Allergy    Ativan [Lorazepam] Other (See Comments)    Hallucination     Aygestin [Norethindrone]     Muscle weakness, abdominal pain   Celebrex [Celecoxib] Other (See Comments)    Possible Reaction - Not taken    Demeclocycline     Tetracycline Family-  GI Upset   Dexilant [Dexlansoprazole]     Severe joint pain     Doxycycline     Possibly joint pain or itch    Effexor Xr [Venlafaxine Hcl Er]     Migraines, weight gain, trouble sleeping   Elavil [Amitriptyline] Nausea And Vomiting    Headache, and muscle weakness   Erythromycin     Upset stomach   Erythromycin Base     Stomach Upset    Fentanyl  Dermatitis    Patches*    Fioricet [Butalbital-Apap-Caffeine]     Acetaminophen     Hydrocodone Itching   Hydrocodone Bit-Homatrop Mbr Hives and Itching    Not true allergy. irritated   Hydromorphone  Nausea Only   Inderal [Propranolol]     Agitation, difficulty sleeping    Indomethacin Hives and Itching   Latex Itching    Skin Peeling    Levofloxacin     Weakness, myalgia   Lyrica [Pregabalin]     Muscle weakness, Causes Falls    Maxalt [Rizatriptan] Other (See Comments)    Pt was knocked out from med, extreme drowsiness and did not help migraine    Meloxicam Itching    Increases BP   Morphine  Sulfate Er     Severe insomnia, does not help pain    Naproxen Hives and Itching    Head to Toe    Neurontin [Gabapentin]     Depression    Nexium [Esomeprazole]     Bad headaches    Nitrofuran Derivatives    Nortriptyline  Depression, decreased sex drive    Omeprazole-Sodium Bicarbonate     Chest pain-sore all over-HA   Ranitidine     Severe joint pain   Robaxin  [Methocarbamol ]     Made her very tired and unable to sleep at night Muscle weakness, fatigue    Tape Dermatitis   Tetanus Toxoid Other (See Comments)    Tetanus injection - blistering rash, swelling at injection site, turn into baseball size lump, joint pain    Tetanus-Diphtheria Toxoids Td Hives    Robust local reaction   Tetracyclines & Related     GI Upset    Tizanidine Other (See Comments)    knocked her out- had a rebound migraine, causes muscles to lock, and bedridden    Topamax [Topiramate]     Lethargic, daytime drowsiness, insomnia    Trazodone     Pt thinks it did not help - unsure of reaction    Tums [Calcium   Carbonate] Other (See Comments)    Blistering Rash with high doses    Wound Dressing Adhesive Itching and Dermatitis    Bandages    Etodolac  Rash    Blistering Rash, and swollen lymph nodes    Flexeril  [Cyclobenzaprine ] Palpitations    Irregular heartbeat    Ibuprofen Itching, Hives, Rash and Swelling    Throat itching   Neosporin [Neomycin-Bacitracin Zn-Polymyx] Rash    Blistering    Objective:   Today's Vitals   04/29/24 1306  BP: 136/80  Pulse: (!) 57  Temp: 98.5 F (36.9 C)  TempSrc: Oral  SpO2: 97%  Weight: 160 lb (72.6 kg)  Height: 5' 2 (1.575 m)   Body mass index is 29.26 kg/m.   General: Well developed, well nourished. No acute distress. Skin: Warm and dry. There is a small subcutaneous nodule underlying  a pit in the skin. No current   redness or swelling noted. Psych: Alert and oriented. Normal mood and affect.  Health Maintenance Due  Topic Date Due   HIV Screening  Never done   Pneumococcal Vaccine: 50+ Years (1 of 2 - PCV) Never done   Zoster Vaccines- Shingrix (1 of 2) Never done     Assessment & Plan:   Problem List Items Addressed This Visit       Cardiovascular and Mediastinum   Essential hypertension   Ms. Johnson's blood pressure is borderline high today, likely due to her headache. Continue atenolol  25 mg 1/2 tab daily.      Migraine headache - Primary   [name] is having a headache today. Appears to be typical migraine. I recommend she go home, take her sumatriptan , use and ice pack and try and get some sleep.      Other Visit Diagnoses       Sebaceous cyst       Skin finding c/w a sebaceous cyst. I will refer her to dermatology for elective excision.   Relevant Orders   Ambulatory referral to Dermatology       Return in about 2 months (around 06/29/2024) for Reassessment.   Garnette CHRISTELLA Simpler, MD

## 2024-04-29 NOTE — Assessment & Plan Note (Signed)
 Samantha [name] is having a headache today. Appears to be typical migraine. I recommend she go home, take her sumatriptan , use and ice pack and try and get some sleep.

## 2024-05-05 ENCOUNTER — Other Ambulatory Visit: Payer: Self-pay | Admitting: Family Medicine

## 2024-05-05 DIAGNOSIS — F4001 Agoraphobia with panic disorder: Secondary | ICD-10-CM

## 2024-05-05 DIAGNOSIS — F411 Generalized anxiety disorder: Secondary | ICD-10-CM

## 2024-05-06 ENCOUNTER — Other Ambulatory Visit (HOSPITAL_BASED_OUTPATIENT_CLINIC_OR_DEPARTMENT_OTHER): Payer: Self-pay

## 2024-05-06 MED ORDER — ALPRAZOLAM 1 MG PO TABS
1.0000 mg | ORAL_TABLET | Freq: Two times a day (BID) | ORAL | 2 refills | Status: DC | PRN
  Filled 2024-05-06: qty 20, 10d supply, fill #0
  Filled 2024-05-21: qty 20, 10d supply, fill #1
  Filled 2024-06-05: qty 20, 10d supply, fill #2

## 2024-05-06 MED ORDER — DIAZEPAM 10 MG PO TABS
10.0000 mg | ORAL_TABLET | Freq: Four times a day (QID) | ORAL | 2 refills | Status: DC | PRN
  Filled 2024-05-06: qty 30, 8d supply, fill #0
  Filled 2024-05-21: qty 30, 8d supply, fill #1
  Filled 2024-06-05: qty 30, 8d supply, fill #2

## 2024-05-21 ENCOUNTER — Other Ambulatory Visit: Payer: Self-pay

## 2024-05-29 ENCOUNTER — Other Ambulatory Visit: Payer: Self-pay | Admitting: Family Medicine

## 2024-05-29 ENCOUNTER — Other Ambulatory Visit: Payer: Self-pay

## 2024-05-29 ENCOUNTER — Other Ambulatory Visit (HOSPITAL_BASED_OUTPATIENT_CLINIC_OR_DEPARTMENT_OTHER): Payer: Self-pay

## 2024-05-29 DIAGNOSIS — F908 Attention-deficit hyperactivity disorder, other type: Secondary | ICD-10-CM

## 2024-05-29 MED ORDER — METHYLPHENIDATE HCL 10 MG PO TABS
10.0000 mg | ORAL_TABLET | Freq: Three times a day (TID) | ORAL | 0 refills | Status: DC
  Filled 2024-05-29: qty 90, 30d supply, fill #0

## 2024-05-29 NOTE — Telephone Encounter (Signed)
 Refill request for  Methylphenidate  10 mg LR 04/24/24, #90, 0 rf LOV  04/29/24 FOV  06/28/24  Please review and advise.  Thanks. Dm/cma

## 2024-05-30 ENCOUNTER — Other Ambulatory Visit (HOSPITAL_BASED_OUTPATIENT_CLINIC_OR_DEPARTMENT_OTHER): Payer: Self-pay

## 2024-05-30 ENCOUNTER — Other Ambulatory Visit: Payer: Self-pay | Admitting: Family Medicine

## 2024-05-30 DIAGNOSIS — G43709 Chronic migraine without aura, not intractable, without status migrainosus: Secondary | ICD-10-CM

## 2024-05-31 ENCOUNTER — Other Ambulatory Visit (HOSPITAL_BASED_OUTPATIENT_CLINIC_OR_DEPARTMENT_OTHER): Payer: Self-pay

## 2024-05-31 MED ORDER — SUMATRIPTAN SUCCINATE 50 MG PO TABS
ORAL_TABLET | ORAL | 3 refills | Status: DC
  Filled 2024-05-31: qty 10, 30d supply, fill #0
  Filled 2024-07-04: qty 10, 30d supply, fill #1
  Filled 2024-08-12: qty 10, 30d supply, fill #2
  Filled 2024-09-17 – 2024-09-18 (×2): qty 10, 30d supply, fill #3

## 2024-06-06 ENCOUNTER — Other Ambulatory Visit (HOSPITAL_BASED_OUTPATIENT_CLINIC_OR_DEPARTMENT_OTHER): Payer: Self-pay

## 2024-06-06 ENCOUNTER — Encounter: Payer: Self-pay | Admitting: Urology

## 2024-06-06 ENCOUNTER — Ambulatory Visit: Admitting: Urology

## 2024-06-06 ENCOUNTER — Other Ambulatory Visit: Payer: Self-pay

## 2024-06-06 VITALS — BP 147/86 | HR 76 | Ht 62.0 in | Wt 155.0 lb

## 2024-06-06 DIAGNOSIS — N3010 Interstitial cystitis (chronic) without hematuria: Secondary | ICD-10-CM

## 2024-06-06 DIAGNOSIS — N3941 Urge incontinence: Secondary | ICD-10-CM | POA: Diagnosis not present

## 2024-06-06 DIAGNOSIS — R3129 Other microscopic hematuria: Secondary | ICD-10-CM

## 2024-06-06 DIAGNOSIS — R109 Unspecified abdominal pain: Secondary | ICD-10-CM

## 2024-06-06 LAB — URINALYSIS, ROUTINE W REFLEX MICROSCOPIC
Bilirubin, UA: NEGATIVE
Glucose, UA: NEGATIVE
Ketones, UA: NEGATIVE
Leukocytes,UA: NEGATIVE
Nitrite, UA: NEGATIVE
Protein,UA: NEGATIVE
Specific Gravity, UA: 1.01 (ref 1.005–1.030)
Urobilinogen, Ur: 0.2 mg/dL (ref 0.2–1.0)
pH, UA: 7 (ref 5.0–7.5)

## 2024-06-06 LAB — MICROSCOPIC EXAMINATION

## 2024-06-06 LAB — BLADDER SCAN AMB NON-IMAGING

## 2024-06-06 MED ORDER — MIRABEGRON ER 25 MG PO TB24
25.0000 mg | ORAL_TABLET | Freq: Every day | ORAL | 11 refills | Status: DC
  Filled 2024-06-06: qty 30, 30d supply, fill #0

## 2024-06-06 NOTE — Progress Notes (Signed)
 Assessment: 1. Microscopic hematuria   2. Urge incontinence   3. Interstitial cystitis    Plan: Trial of Myrbetriq  25 mg daily.  Prescription sent. Schedule for CT renal stone study for evaluation of abdominal pain. Will contact her with results. Return to office in 3 months.  Chief Complaint:  Chief Complaint  Patient presents with   Hematuria   Urinary Incontinence    History of Present Illness:  Samantha [name] is a 53 y.o. female who is seen for further evaluation of microscopic hematuria, interstitial cystitis, urethral stricture, urge incontinence, and possible renal mass. She has previously been followed at [facility] in [facility] and [facility]. She was last seen by me in April 2022.  She has previously been evaluated for microscopic hematuria by Dr. Alfonzo in May 2017.  At that time cystoscopy showed a normal bladder.  She has had continued microscopic hematuria.  Hematuria profile from 2/22 showed no malignant cells or dysplasia, blood and or erythrocytic casts indicating glomerular and/or renal tubular bleeding.  She was referred to nephrology for further evaluation.   She has had an extensive workup with autoimmune and genetic testing.  She has a history of interstitial cystitis which was managed with dietary modification.  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  She has had symptoms of frequency, urgency, and nocturia as well as sensation of incomplete emptying, hesitancy, and urge incontinence.  She was previously unable to tolerate Vesicare due to side effects of blurred vision, headaches, fatigue, constipation, and dry mouth.  She was given a trial of Gemtesa in April 2022.  CT imaging from April 2024 showed no renal or ureteral calculi and no evidence of obstruction. She underwent cystoscopy with retrograde pyelograms, urethral dilation, and hydrodistention by Dr.Machen on 02/22/2023.  No filling  defects or obstruction was seen on the pyelograms.  Her bladder was unremarkable without ulcerations or glomerulations.  Renal ultrasound from 04/13/2023 showed hypo to anechoic masses in the left kidney possibly representing cyst but incompletely characterized.  She reports 7 episodes of gross hematuria this year.  Her episodes typically last 1-2 days and resolve spontaneously.  She has associated symptoms of suprapubic pain, low back pain, dysuria, increased frequency, urgency.  No fevers or chills. She does not have significant urinary symptoms in between these episodes.  CT abdomen with and without contrast from 05/02/2023 showed no obvious renal masses and a punctate left renal stone. Hematuria profile from 04/27/2023 was negative for malignant cells or dysplasia and showed isomorphic erythrocytes indicating lower urinary tract or uroepithelial bleeding.  At her visit in March 2025, she reported increased urinary symptoms within the prior few weeks.  She noted pain with urination and some blood on her toilet paper.  She reported noticing that the skin around the urethra was torn and bleeding.  She had increased urinary symptoms with intermittent stream and sensation of incomplete emptying.  She generally felt bad with a decreased appetite. She was no longer using the Estrace  vaginal cream as she stated that it caused abdominal cramping.  Upon further questioning, she reports that most of these symptoms began after intercourse.  She returns today for follow-up.  She reports onset of some low back pain earlier this month.  She has not had any recent gross hematuria.  Her back pain has improved within the past week.  She continues with symptoms of frequency, urgency, sensation of incomplete emptying, and incontinence.  No dysuria.  Portions of the above documentation were copied from a prior visit for review purposes only.   Past Medical History:  Past Medical History:  Diagnosis Date   ADD  (attention deficit disorder)    Allergy    Anaphylaxis    Anemia    Anxiety    Arthritis    Asthma    Cataract    Chronic back pain    Chronic kidney disease    Chronic neck pain    Clotting disorder 2005   Complication of anesthesia    Waking up before procedure was over.   Degenerative disc disease at L5-S1 level    Dysrhythmia    Had  abnormal beats d/t B/P med she was on. B/P med reduced.   Endometriosis    Familial cold urticaria    Family history of adverse reaction to anesthesia    Fibromyalgia    GERD (gastroesophageal reflux disease)    Headache    Migraines   Hernia    Hyperlipidemia    Hypersomnia    Hypertension    IBS (irritable bowel syndrome)    IC (interstitial cystitis)    IgG Gliadin antibody positive    Low blood potassium    Migraines    Multiple allergies    OCD (obsessive compulsive disorder)    Raynaud disease    Raynaud's disease    Umbilical hernia    Vertigo    Vertigo     Past Surgical History:  Past Surgical History:  Procedure Laterality Date   BLADDER SURGERY     age 80- 26   CERVICAL FUSION     08/1998, 03/10/2017   CESAREAN SECTION     COLONOSCOPY     COMBINED HYSTEROSCOPY DIAGNOSTIC / D&C     CYSTO WITH HYDRODISTENSION N/A 02/22/2023   Procedure: HYDRODISTENSION;  Surgeon: Lovie Arlyss CROME, MD;  Location: [facility];  Service: Urology;  Laterality: N/A;   CYSTOSCOPY W/ RETROGRADES N/A 02/22/2023   Procedure: CYSTOSCOPY WITH BILATERAL RETROGRADE PYELOGRAM;  Surgeon: Lovie Arlyss CROME, MD;  Location: [facility];  Service: Urology;  Laterality: N/A;  60 MINUTES NEEDED FOR CASE   CYSTOSCOPY WITH URETHRAL DILATATION N/A 02/22/2023   Procedure: URETHRAL DILATATION;  Surgeon: Lovie Arlyss CROME, MD;  Location: [facility];  Service: Urology;  Laterality: N/A;   HAND SURGERY     2012, 2013   LAPAROSCOPIC ENDOMETRIOSIS FULGURATION Right    LESION REMOVAL  08/23/2012   Procedure: LESION REMOVAL HAND;  Surgeon: Lamar LULLA Leonor Mickey., MD;  Location:  [facility];  Service: Orthopedics;  Laterality: Left;   ROTATOR CUFF REPAIR     SPINE SURGERY  [id number]   UPPER GASTROINTESTINAL ENDOSCOPY      Allergies:  Allergies  Allergen Reactions   Acetaminophen  Rash    Blistering lymph nodes, and blistering rash    Airduo Digihaler  [Fluticasone -Salmeterol] Hives, Shortness Of Breath and Itching    Asthma attack   Allegra  [Fexofenadine ] Cough    She reports tachycardia and elevated BP and low Oxygen level after taking this   Aspirin Shortness Of Breath    High dose brings asthma attack    B-12 [Cyanocobalamin ] Shortness Of Breath and Itching    Drop O2   Baclofen  Other (See Comments)    Headache, muscle weakness, unable to urinate, unable to walk    Bactrim Anaphylaxis   Bee Venom Hives, Itching, Other (See Comments) and Rash   Benadryl  [Diphenhydramine ] Anaphylaxis    Only the liquid form she  can not take because it causes throat closing,  DO NOT USE IN IV FORM - tablets are ok   Ciprofloxacin Shortness Of Breath    Blurred Vision, joint pain, headaches, dropped BP   Clindamycin Shortness Of Breath   Coconut (Cocos Nucifera) Hives, Itching and Other (See Comments)    Coconut Oil    Coq-10 [Coenzyme Q10] Hives, Shortness Of Breath and Itching   Cymbalta [Duloxetine Hcl] Anaphylaxis and Shortness Of Breath   Darvon [Propoxyphene] Hives, Shortness Of Breath and Itching    Darvocet*   Estradiol  Rash and Nausea Only    Lethargy, abdominal and joint pain, nausea, and rash with Estradiol  vaginal cream   Flunisolide  Shortness Of Breath and Other (See Comments)    Aerospan  Nasal Inhaler* - caused blood clots in nose    Fluticasone  Furoate Anaphylaxis, Cough, Nausea And Vomiting, Palpitations and Shortness Of Breath   Formaldehyde Anaphylaxis   Hydrocodone-Acetaminophen  Hives, Shortness Of Breath and Itching    Vicodin, Norco   Nucynta [Tapentadol] Shortness Of Breath    Coughing, SOB    Other Hives, Itching, Rash and Other  (See Comments)    Insect stings/ Bites  Wine spirit  Peanuts - Bring on Asthma attack   Cold Temperatures  - causes hives, and muscle spasms    Peanut-Containing Drug Products Hives, Shortness Of Breath, Itching, Swelling and Other (See Comments)   Potassium-Containing Compounds Anaphylaxis, Itching, Swelling and Palpitations    Tables, and Oral Solution    Ramipril Anaphylaxis   Skelaxin  [Metaxalone ] Hives, Shortness Of Breath and Itching    Brings on Asthma Attacks    Sulfa Antibiotics Anaphylaxis   Sulfamethoxazole-Trimethoprim Anaphylaxis   Tezspire  [Tezepelumab ] Shortness Of Breath, Nausea And Vomiting, Swelling, Rash and Other (See Comments)    Muscle weakness, joint pain, sore throat   Triamcinolone  Other (See Comments)    Kenalog  Injection - muscle weakness, spasms, BP drop, lethargic, tongue swelling   Nasacort  causes breathing issues    Voltaren [Diclofenac] Shortness Of Breath, Swelling and Rash    Gel*    Zofran  [Ondansetron  Hcl] Hives and Itching    Blurred Vision    Aspartame Other (See Comments)    Severe Migraines   Bacitra-Neomycin-Polymyxin-Hc Rash   Cranberry Extract Other (See Comments)    Throat itching / burning    Sulfur Hives and Itching   Aciphex  [Rabeprazole ]     Muscle weakness, headaches    Addyi [Flibanserin]     Decreased sex drive to 0   Alvesco  [Ciclesonide ] Other (See Comments)    Sores in mouth, coughing    Ambien [Zolpidem]     Mood Change, Aggressive, Insomnia    Amoxicillin      Pt unsure of Allergy    Ativan [Lorazepam] Other (See Comments)    Hallucination     Aygestin [Norethindrone]     Muscle weakness, abdominal pain   Celebrex [Celecoxib] Other (See Comments)    Possible Reaction - Not taken    Demeclocycline     Tetracycline Family-  GI Upset   Dexilant [Dexlansoprazole]     Severe joint pain    Doxycycline     Possibly joint pain or itch    Effexor Xr [Venlafaxine Hcl Er]     Migraines, weight gain, trouble sleeping    Elavil [Amitriptyline] Nausea And Vomiting    Headache, and muscle weakness   Erythromycin     Upset stomach   Erythromycin Base     Stomach Upset    Fentanyl  Dermatitis  Patches*    Fioricet [Butalbital-Apap-Caffeine]     Acetaminophen     Hydrocodone Itching   Hydrocodone Bit-Homatrop Mbr Hives and Itching    Not true allergy. irritated   Hydromorphone  Nausea Only   Inderal [Propranolol]     Agitation, difficulty sleeping    Indomethacin Hives and Itching   Latex Itching    Skin Peeling    Levofloxacin     Weakness, myalgia   Lyrica [Pregabalin]     Muscle weakness, Causes Falls    Maxalt [Rizatriptan] Other (See Comments)    Pt was knocked out from med, extreme drowsiness and did not help migraine    Meloxicam Itching    Increases BP   Morphine  Sulfate Er     Severe insomnia, does not help pain    Naproxen Hives and Itching    Head to Toe    Neurontin [Gabapentin]     Depression    Nexium [Esomeprazole]     Bad headaches    Nitrofuran Derivatives    Nortriptyline     Depression, decreased sex drive    Omeprazole-Sodium Bicarbonate     Chest pain-sore all over-HA   Ranitidine     Severe joint pain   Robaxin  [Methocarbamol ]     Made her very tired and unable to sleep at night Muscle weakness, fatigue    Tape Dermatitis   Tetanus Toxoid Other (See Comments)    Tetanus injection - blistering rash, swelling at injection site, turn into baseball size lump, joint pain    Tetanus-Diphtheria Toxoids Td Hives    Robust local reaction   Tetracyclines & Related     GI Upset    Tizanidine Other (See Comments)    knocked her out- had a rebound migraine, causes muscles to lock, and bedridden    Topamax [Topiramate]     Lethargic, daytime drowsiness, insomnia    Trazodone     Pt thinks it did not help - unsure of reaction    Tums [Calcium  Carbonate] Other (See Comments)    Blistering Rash with high doses    Wound Dressing Adhesive Itching and Dermatitis     Bandages    Etodolac  Rash    Blistering Rash, and swollen lymph nodes    Flexeril  [Cyclobenzaprine ] Palpitations    Irregular heartbeat    Ibuprofen Itching, Hives, Rash and Swelling    Throat itching   Neosporin [Neomycin-Bacitracin Zn-Polymyx] Rash    Blistering     Family History:  Family History  Problem Relation Age of Onset   Asthma Mother    Clotting disorder Mother    Heart attack Mother    Hypertension Mother    Cancer Father        Multiple myeloma   Anemia Father    Arrhythmia Father    Heart attack Father    Heart disease Father    Hyperlipidemia Father    Hypertension Father    ADD / ADHD Brother    Arthritis Brother    Learning disabilities Brother    Stomach cancer Maternal Grandfather    Asthma Maternal Grandfather    Heart attack Maternal Grandfather    Cancer Maternal Grandmother    Kidney disease Maternal Grandmother    Heart attack Paternal Grandfather    Diabetes Paternal Grandmother    ADD / ADHD Son    Anxiety disorder Son    Hearing loss Son    Learning disabilities Son    Cancer Paternal Uncle    COPD Paternal Uncle  Colon cancer Neg Hx    Esophageal cancer Neg Hx    Rectal cancer Neg Hx     Social History:  Social History   Tobacco Use   Smoking status: Never   Smokeless tobacco: Never  Vaping Use   Vaping status: Never Used  Substance Use Topics   Alcohol use: Yes    Comment: occasional   Drug use: No    ROS: Constitutional:  Negative for fever, chills, weight loss CV: Negative for chest pain, previous MI, hypertension Respiratory:  Negative for shortness of breath, wheezing, sleep apnea, frequent cough GI:  Negative for nausea, vomiting, bloody stool, GERD  Physical exam: BP (!) 147/86   Pulse 76   Ht 5' 2 (1.575 m)   Wt 155 lb (70.3 kg)   LMP  (LMP Unknown)   BMI 28.35 kg/m  GENERAL APPEARANCE:  Well appearing, well developed, well nourished, NAD HEENT:  Atraumatic, normocephalic, oropharynx clear NECK:   Supple without lymphadenopathy or thyromegaly ABDOMEN:  Soft, non-tender, no masses EXTREMITIES:  Moves all extremities well, without clubbing, cyanosis, or edema NEUROLOGIC:  Alert and oriented x 3, normal gait, CN II-XII grossly intact MENTAL STATUS:  appropriate BACK:  Non-tender to palpation, No CVAT SKIN:  Warm, dry, and intact  Results: U/A: 0-5 WBCs, 0-2 RBCs, few bacteria  PVR = 24 mL

## 2024-06-19 ENCOUNTER — Other Ambulatory Visit: Payer: Self-pay | Admitting: Family Medicine

## 2024-06-19 ENCOUNTER — Other Ambulatory Visit (HOSPITAL_BASED_OUTPATIENT_CLINIC_OR_DEPARTMENT_OTHER): Payer: Self-pay

## 2024-06-19 ENCOUNTER — Other Ambulatory Visit: Payer: Self-pay

## 2024-06-19 DIAGNOSIS — F4001 Agoraphobia with panic disorder: Secondary | ICD-10-CM

## 2024-06-19 DIAGNOSIS — F411 Generalized anxiety disorder: Secondary | ICD-10-CM

## 2024-06-19 MED ORDER — ALPRAZOLAM 1 MG PO TABS
1.0000 mg | ORAL_TABLET | Freq: Two times a day (BID) | ORAL | 2 refills | Status: DC | PRN
  Filled 2024-06-19: qty 20, 10d supply, fill #0
  Filled 2024-07-02: qty 20, 10d supply, fill #1
  Filled 2024-07-17: qty 20, 10d supply, fill #2

## 2024-06-19 MED ORDER — DIAZEPAM 10 MG PO TABS
10.0000 mg | ORAL_TABLET | Freq: Four times a day (QID) | ORAL | 2 refills | Status: DC | PRN
  Filled 2024-06-19: qty 30, 8d supply, fill #0
  Filled 2024-07-02: qty 30, 8d supply, fill #1
  Filled 2024-07-17: qty 30, 8d supply, fill #2

## 2024-06-28 ENCOUNTER — Ambulatory Visit: Admitting: Family Medicine

## 2024-06-28 ENCOUNTER — Other Ambulatory Visit (HOSPITAL_BASED_OUTPATIENT_CLINIC_OR_DEPARTMENT_OTHER): Payer: Self-pay

## 2024-06-28 VITALS — BP 126/74 | HR 73 | Temp 97.6°F | Ht 62.0 in | Wt 156.2 lb

## 2024-06-28 DIAGNOSIS — I10 Essential (primary) hypertension: Secondary | ICD-10-CM | POA: Diagnosis not present

## 2024-06-28 DIAGNOSIS — N1831 Chronic kidney disease, stage 3a: Secondary | ICD-10-CM | POA: Diagnosis not present

## 2024-06-28 DIAGNOSIS — J4541 Moderate persistent asthma with (acute) exacerbation: Secondary | ICD-10-CM

## 2024-06-28 DIAGNOSIS — E785 Hyperlipidemia, unspecified: Secondary | ICD-10-CM | POA: Diagnosis not present

## 2024-06-28 MED ORDER — PREDNISONE 20 MG PO TABS
20.0000 mg | ORAL_TABLET | Freq: Every day | ORAL | 0 refills | Status: DC
  Filled 2024-06-28: qty 7, 7d supply, fill #0

## 2024-06-28 MED ORDER — METHYLPREDNISOLONE SODIUM SUCC 125 MG IJ SOLR
125.0000 mg | Freq: Once | INTRAMUSCULAR | Status: AC
  Administered 2024-06-28: 125 mg via INTRAMUSCULAR

## 2024-06-28 MED ORDER — ROSUVASTATIN CALCIUM 10 MG PO TABS
10.0000 mg | ORAL_TABLET | Freq: Every day | ORAL | 3 refills | Status: AC
  Filled 2024-06-28 (×2): qty 90, 90d supply, fill #0
  Filled 2024-09-25: qty 90, 90d supply, fill #1

## 2024-06-28 NOTE — Assessment & Plan Note (Signed)
 Stable. Continue focus on blood pressure control, adequate hydration, and avoidance of nephrotoxic medications.

## 2024-06-28 NOTE — Assessment & Plan Note (Signed)
 LDL cholesterol is at goal. Continue rosuvastatin 10 mg daily.

## 2024-06-28 NOTE — Assessment & Plan Note (Signed)
 Ms. Ferdie blood pressure is in good control. Continue atenolol  25 mg 1/2 tab daily.

## 2024-06-28 NOTE — Progress Notes (Signed)
 [facility] [address] [place] [address] Dept: [phone] Dept Fax: [phone]  Chronic Care Office Visit  Subjective:    Patient ID: Samantha [name], female    DOB: October 31, 1970, 53 y.o..   MRN: [id number]  Chief Complaint  Patient presents with   Hypertension    2 month f/u HTN.  C/o having some asthma issues (construction around home).    History of Present Illness:  Patient is in today for reassessment of chronic medical issues.  Samantha [name] has a history of hypertension and is managed on atenolol  25 mg 1/2 tab daily. She notes her pressure was up this morning. She is unsure if this relates to her headache onset or not.   Samantha [name] has a history of hyperlipidemia. She is managed on rosuvastatin  10 mg daily.   Samantha [name] has a history of asthma and allergy symptoms. She is followed by Dr. Bennetta (allergist).  She notes that her asthma has been recently flared. She feels this relates to construction going on near her home. she showed me a vidoe showing active grading work near her home with considerable dust into the air. Samantha [name] notes she awakened last night barely able to breathe. She was able to take 4 puffs of her AirSupra  which did help to relieve her symptoms.  Past Medical History: Patient Active Problem List   Diagnosis Date Noted   COVID-19 long hauler 06/26/2023   Gross hematuria 04/27/2023   Renal mass 04/24/2023   Cervical spine arthritis 02/25/2022   Leg swelling 10/19/2021   Interstitial cystitis 09/15/2021   Other urethral stricture, female 09/15/2021   Chronic kidney disease, stage 3a (HCC) 08/20/2021   Prediabetes 08/27/2020   History of [place] spotted fever 02/04/2020   Moderate persistent asthma without complication 08/02/2019   LPRD (laryngopharyngeal reflux disease) 08/02/2019   Chronic rhinitis 08/02/2019   Chronic post-traumatic stress disorder (PTSD) 11/21/2018    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) 11/21/2018   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/2018   Pulmonary nodules 11/13/2018   Other microscopic hematuria 11/06/2018   Generalized anxiety disorder 07/04/2018   Fecal incontinence 07/02/2018   Leg weakness 06/12/2017   Left ear hearing loss 02/07/2017   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   Borderline hyperlipidemia 11/08/2016   Acute idiopathic gout of multiple sites 11/08/2016   Elbow pain, chronic, right 10/06/2016   Lateral epicondylitis, right elbow 10/06/2016   Fibromyalgia 10/04/2016   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   Migraine headache 07/29/2016   Irritable bowel syndrome with both constipation and diarrhea 07/29/2016   Hives 07/18/2016   Urinary incontinence 06/02/2016   Atherosclerosis of abdominal aorta 09/03/2015   Carpal tunnel syndrome 11/19/2014   Primary hypersomnia 11/18/2014   Insomnia 11/18/2014   Lumbar radicular pain 11/18/2014   Chronic pain syndrome 11/18/2014   Cervical disc disorder with radiculopathy 11/18/2014   Chronic female pelvic pain 07/31/2014   Degeneration of intervertebral disc of lumbosacral region 07/31/2014   Rosacea 07/31/2014   Morton's neuroma 07/31/2014   Benign paroxysmal positional vertigo 07/31/2014   Essential hypertension 07/31/2014   History of methicillin resistant Staphylococcus aureus infection 07/09/2014   Past Surgical History:  Procedure Laterality Date   BLADDER SURGERY     age 89- 58   CERVICAL FUSION     08/1998, 03/10/2017   CESAREAN SECTION     COLONOSCOPY     COMBINED HYSTEROSCOPY DIAGNOSTIC / D&C     CYSTO WITH  HYDRODISTENSION N/A 02/22/2023   Procedure: HYDRODISTENSION;  Surgeon: Lovie Arlyss CROME, MD;  Location: [facility];  Service: Urology;  Laterality: N/A;   CYSTOSCOPY W/ RETROGRADES N/A 02/22/2023   Procedure: CYSTOSCOPY WITH BILATERAL RETROGRADE PYELOGRAM;  Surgeon: Lovie Arlyss CROME, MD;  Location: [facility];   Service: Urology;  Laterality: N/A;  60 MINUTES NEEDED FOR CASE   CYSTOSCOPY WITH URETHRAL DILATATION N/A 02/22/2023   Procedure: URETHRAL DILATATION;  Surgeon: Lovie Arlyss CROME, MD;  Location: [facility];  Service: Urology;  Laterality: N/A;   HAND SURGERY     2012, 2013   LAPAROSCOPIC ENDOMETRIOSIS FULGURATION Right    LESION REMOVAL  08/23/2012   Procedure: LESION REMOVAL HAND;  Surgeon: Lamar LULLA Leonor Mickey., MD;  Location: [facility];  Service: Orthopedics;  Laterality: Left;   ROTATOR CUFF REPAIR     SPINE SURGERY  [phone]   UPPER GASTROINTESTINAL ENDOSCOPY     Family History  Problem Relation Age of Onset   Asthma Mother    Clotting disorder Mother    Heart attack Mother    Hypertension Mother    Cancer Father        Multiple myeloma   Anemia Father    Arrhythmia Father    Heart attack Father    Heart disease Father    Hyperlipidemia Father    Hypertension Father    ADD / ADHD Brother    Arthritis Brother    Learning disabilities Brother    Stomach cancer Maternal Grandfather    Asthma Maternal Grandfather    Heart attack Maternal Grandfather    Cancer Maternal Grandmother    Kidney disease Maternal Grandmother    Heart attack Paternal Grandfather    Diabetes Paternal Grandmother    ADD / ADHD Son    Anxiety disorder Son    Hearing loss Son    Learning disabilities Son    Cancer Paternal Uncle    COPD Paternal Uncle    Colon cancer Neg Hx    Esophageal cancer Neg Hx    Rectal cancer Neg Hx    Outpatient Medications Prior to Visit  Medication Sig Dispense Refill   AIRSUPRA  90-80 MCG/ACT AERO Inhale 2 puffs into the lungs every 6 (six) hours as needed. 10.7 g 2   albuterol  (VENTOLIN  HFA) 108 (90 Base) MCG/ACT inhaler INHALE 2 PUFFS BY MOUTH EVERY 4 HOURS AS NEEDED FOR WHEEZING FOR SHORTNESS OF BREATH 18 g 0   ALPRAZolam  (XANAX ) 1 MG tablet Take 1 tablet (1 mg total) by mouth 2 (two) times daily as needed. 20 tablet 2   Alum Hydroxide-Mag Trisilicate  (GAVISCON) 80-14.2 MG CHEW Chew 2 tablets by mouth as needed (acid reflux).     aspirin EC 81 MG tablet Take 81 mg by mouth daily. Swallow whole.     atenolol  (TENORMIN ) 25 MG tablet Take 0.5 tablets (12.5 mg total) by mouth daily. 90 tablet 1   AUVI-Q  0.3 MG/0.3ML SOAJ injection Inject 0.3 mg into the muscle as needed for anaphylaxis. As directed for life-threatening allergic reactions 2 each 1   azelastine  (ASTELIN ) 0.1 % nasal spray Place 2 sprays into each nostril 1-2 times per day. 30 mL 5   benzonatate  (TESSALON ) 200 MG capsule Take 1 capsule (200 mg total) by mouth 3 (three) times daily as needed. 21 capsule 0   budesonide  (PULMICORT ) 0.25 MG/2ML nebulizer solution Take 0.25 mg by nebulization 2 (two) times daily as needed (Asthma).     Cholecalciferol (VITAMIN D ) 50 MCG (  2000 UT) CAPS Take 2,000 Units by mouth daily.     cimetidine (TAGAMET) 200 MG tablet Take 200 mg by mouth 2 (two) times daily.     diazepam  (VALIUM ) 10 MG tablet Take 1 tablet (10 mg total) by mouth every 6 (six) hours as needed for anxiety or sleep (vertigo/muscle spasms). 30 tablet 2   EPINEPHrine  (PRIMATENE  MIST) 0.125 MG/ACT AERO Inhale 1 puff into the lungs as needed (Asthma).     estradiol  (ESTRACE ) 0.1 MG/GM vaginal cream Place 1 gram vaginally 2 (two) times a week at bedtime for 90 days. 42.5 g 3   famotidine (PEPCID AC MAXIMUM STRENGTH) 20 MG tablet Take 20 mg by mouth daily as needed (Hives/itching).     guaifenesin (HUMIBID E) 400 MG TABS tablet Take 400-800 mg by mouth every 4 (four) hours as needed (Cough/mucus).     ipratropium (ATROVENT ) 0.06 % nasal spray Place 2 sprays into each nostril 1-2 times per day. 15 mL 5   levalbuterol  (XOPENEX  HFA) 45 MCG/ACT inhaler Inhale 2 puffs into the lungs every 6 (six) hours as needed for wheezing. 15 g 1   magnesium oxide (MAG-OX) 400 (240 Mg) MG tablet Take 400 mg by mouth daily.     methylphenidate  (RITALIN ) 10 MG tablet Take 1 tablet (10 mg total) by mouth 3 (three)  times daily with meals. 90 tablet 0   Olopatadine HCl (PATADAY) 0.2 % SOLN Place 1 drop into both eyes 2 (two) times daily as needed (eye allergies).     SUMAtriptan  (IMITREX ) 50 MG tablet Take 1 tablet (50 mg total) by mouth once as needed. May repeat in 2 hours if headache persists or recurs. 10 tablet 3   rosuvastatin  (CRESTOR ) 10 MG tablet Take 1 tablet by mouth once daily 90 tablet 3   mirabegron  ER (MYRBETRIQ ) 25 MG TB24 tablet Take 1 tablet (25 mg total) by mouth daily. (Patient not taking: Reported on 06/28/2024) 30 tablet 11   No facility-administered medications prior to visit.   Allergies  Allergen Reactions   Acetaminophen  Rash    Blistering lymph nodes, and blistering rash    Airduo Digihaler  [Fluticasone -Salmeterol] Hives, Shortness Of Breath and Itching    Asthma attack   Allegra  [Fexofenadine ] Cough    She reports tachycardia and elevated BP and low Oxygen level after taking this   Aspirin Shortness Of Breath    High dose brings asthma attack    B-12 [Cyanocobalamin ] Shortness Of Breath and Itching    Drop O2   Baclofen  Other (See Comments)    Headache, muscle weakness, unable to urinate, unable to walk    Bactrim Anaphylaxis   Bee Venom Hives, Itching, Other (See Comments) and Rash   Benadryl  [Diphenhydramine ] Anaphylaxis    Only the liquid form she can not take because it causes throat closing,  DO NOT USE IN IV FORM - tablets are ok   Ciprofloxacin Shortness Of Breath    Blurred Vision, joint pain, headaches, dropped BP   Clindamycin Shortness Of Breath   Coconut (Cocos Nucifera) Hives, Itching and Other (See Comments)    Coconut Oil    Coq-10 [Coenzyme Q10] Hives, Shortness Of Breath and Itching   Cymbalta [Duloxetine Hcl] Anaphylaxis and Shortness Of Breath   Darvon [Propoxyphene] Hives, Shortness Of Breath and Itching    Darvocet*   Estradiol  Rash and Nausea Only    Lethargy, abdominal and joint pain, nausea, and rash with Estradiol  vaginal cream    Flunisolide  Shortness Of Breath and Other (  See Comments)    Aerospan  Nasal Inhaler* - caused blood clots in nose    Fluticasone  Furoate Anaphylaxis, Cough, Nausea And Vomiting, Palpitations and Shortness Of Breath   Formaldehyde Anaphylaxis   Hydrocodone-Acetaminophen  Hives, Shortness Of Breath and Itching    Vicodin, Norco   Nucynta [Tapentadol] Shortness Of Breath    Coughing, SOB    Other Hives, Itching, Rash and Other (See Comments)    Insect stings/ Bites  Wine spirit  Peanuts - Bring on Asthma attack   Cold Temperatures  - causes hives, and muscle spasms    Peanut-Containing Drug Products Hives, Shortness Of Breath, Itching, Swelling and Other (See Comments)   Potassium-Containing Compounds Anaphylaxis, Itching, Swelling and Palpitations    Tables, and Oral Solution    Ramipril Anaphylaxis   Skelaxin  [Metaxalone ] Hives, Shortness Of Breath and Itching    Brings on Asthma Attacks    Sulfa Antibiotics Anaphylaxis   Sulfamethoxazole-Trimethoprim Anaphylaxis   Tezspire  [Tezepelumab ] Shortness Of Breath, Nausea And Vomiting, Swelling, Rash and Other (See Comments)    Muscle weakness, joint pain, sore throat   Triamcinolone  Other (See Comments)    Kenalog  Injection - muscle weakness, spasms, BP drop, lethargic, tongue swelling   Nasacort  causes breathing issues    Voltaren [Diclofenac] Shortness Of Breath, Swelling and Rash    Gel*    Zofran  [Ondansetron  Hcl] Hives and Itching    Blurred Vision    Aspartame Other (See Comments)    Severe Migraines   Bacitra-Neomycin-Polymyxin-Hc Rash   Cranberry Extract Other (See Comments)    Throat itching / burning    Sulfur Hives and Itching   Aciphex  [Rabeprazole ]     Muscle weakness, headaches    Addyi [Flibanserin]     Decreased sex drive to 0   Alvesco  [Ciclesonide ] Other (See Comments)    Sores in mouth, coughing    Ambien [Zolpidem]     Mood Change, Aggressive, Insomnia    Amoxicillin      Pt unsure of Allergy    Ativan  [Lorazepam] Other (See Comments)    Hallucination     Aygestin [Norethindrone]     Muscle weakness, abdominal pain   Celebrex [Celecoxib] Other (See Comments)    Possible Reaction - Not taken    Demeclocycline     Tetracycline Family-  GI Upset   Dexilant [Dexlansoprazole]     Severe joint pain    Doxycycline     Possibly joint pain or itch    Effexor Xr [Venlafaxine Hcl Er]     Migraines, weight gain, trouble sleeping   Elavil [Amitriptyline] Nausea And Vomiting    Headache, and muscle weakness   Erythromycin     Upset stomach   Erythromycin Base     Stomach Upset    Fentanyl  Dermatitis    Patches*    Fioricet [Butalbital-Apap-Caffeine]     Acetaminophen     Hydrocodone Itching   Hydrocodone Bit-Homatrop Mbr Hives and Itching    Not true allergy. irritated   Hydromorphone  Nausea Only   Inderal [Propranolol]     Agitation, difficulty sleeping    Indomethacin Hives and Itching   Latex Itching    Skin Peeling    Levofloxacin     Weakness, myalgia   Lyrica [Pregabalin]     Muscle weakness, Causes Falls    Maxalt [Rizatriptan] Other (See Comments)    Pt was knocked out from med, extreme drowsiness and did not help migraine    Meloxicam Itching    Increases BP  Morphine  Sulfate Er     Severe insomnia, does not help pain    Naproxen Hives and Itching    Head to Toe    Neurontin [Gabapentin]     Depression    Nexium [Esomeprazole]     Bad headaches    Nitrofuran Derivatives    Nortriptyline     Depression, decreased sex drive    Omeprazole-Sodium Bicarbonate     Chest pain-sore all over-HA   Ranitidine     Severe joint pain   Robaxin  [Methocarbamol ]     Made her very tired and unable to sleep at night Muscle weakness, fatigue    Tape Dermatitis   Tetanus Toxoid Other (See Comments)    Tetanus injection - blistering rash, swelling at injection site, turn into baseball size lump, joint pain    Tetanus-Diphtheria Toxoids Td Hives    Robust local reaction    Tetracyclines & Related     GI Upset    Tizanidine Other (See Comments)    knocked her out- had a rebound migraine, causes muscles to lock, and bedridden    Topamax [Topiramate]     Lethargic, daytime drowsiness, insomnia    Trazodone     Pt thinks it did not help - unsure of reaction    Tums [Calcium  Carbonate] Other (See Comments)    Blistering Rash with high doses    Wound Dressing Adhesive Itching and Dermatitis    Bandages    Etodolac  Rash    Blistering Rash, and swollen lymph nodes    Flexeril  [Cyclobenzaprine ] Palpitations    Irregular heartbeat    Ibuprofen Itching, Hives, Rash and Swelling    Throat itching   Neosporin [Neomycin-Bacitracin Zn-Polymyx] Rash    Blistering    Objective:   Today's Vitals   06/28/24 1352  BP: 126/74  Pulse: 73  Temp: 97.6 F (36.4 C)  TempSrc: Temporal  SpO2: 97%  Weight: 156 lb 3.2 oz (70.9 kg)  Height: 5' 2 (1.575 m)   Body mass index is 28.57 kg/m.   General: Well developed, well nourished. No acute distress. Lungs: Diffuse wheezing throughout. No rales or rhonchi. CV: RRR without murmurs or rubs. Pulses 2+ bilaterally. Psych: Alert and oriented. Normal mood and affect.  Health Maintenance Due  Topic Date Due   HIV Screening  Never done   Pneumococcal Vaccine: 50+ Years (1 of 2 - PCV) Never done   Zoster Vaccines- Shingrix (1 of 2) Never done     Assessment & Plan:   Problem List Items Addressed This Visit       Cardiovascular and Mediastinum   Essential hypertension   Ms. Johnson's blood pressure is in good control. Continue atenolol  25 mg 1/2 tab daily.      Relevant Medications   rosuvastatin  (CRESTOR ) 10 MG tablet     Genitourinary   Chronic kidney disease, stage 3a (HCC)   Stable. Continue focus on blood pressure control, adequate hydration, and avoidance of nephrotoxic medications.         Other   Borderline hyperlipidemia   LDL cholesterol is at goal. Continue rosuvastatin  10 mg daily.       Relevant Medications   rosuvastatin  (CRESTOR ) 10 MG tablet   Other Visit Diagnoses       Moderate persistent asthma with acute exacerbation    -  Primary   I will provide an injeciton of Solu-Medrol  125 mg IM today. I will prescribe a course of prednisone . She should continue use of AirSupra  q  4 hours as needed.   Relevant Medications   methylPREDNISolone  sodium succinate (SOLU-MEDROL ) 125 mg/2 mL injection 125 mg (Completed)   predniSONE  (DELTASONE ) 20 MG tablet       Return in about 2 months (around 08/28/2024) for Reassessment.   Garnette CHRISTELLA Simpler, MD

## 2024-07-01 ENCOUNTER — Ambulatory Visit: Payer: Self-pay

## 2024-07-01 ENCOUNTER — Ambulatory Visit: Payer: Self-pay | Admitting: Medical

## 2024-07-01 ENCOUNTER — Other Ambulatory Visit: Payer: Self-pay

## 2024-07-01 ENCOUNTER — Ambulatory Visit (HOSPITAL_BASED_OUTPATIENT_CLINIC_OR_DEPARTMENT_OTHER)
Admission: RE | Admit: 2024-07-01 | Discharge: 2024-07-01 | Disposition: A | Discharge status: Home / Self Care | Source: Ambulatory Visit | Attending: Medical | Admitting: Medical

## 2024-07-01 ENCOUNTER — Ambulatory Visit: Admitting: Medical

## 2024-07-01 ENCOUNTER — Encounter: Payer: Self-pay | Admitting: Medical

## 2024-07-01 ENCOUNTER — Other Ambulatory Visit (HOSPITAL_BASED_OUTPATIENT_CLINIC_OR_DEPARTMENT_OTHER): Payer: Self-pay

## 2024-07-01 VITALS — BP 130/88 | HR 60 | Temp 97.8°F | Resp 15 | Ht 62.0 in | Wt 157.4 lb

## 2024-07-01 DIAGNOSIS — R059 Cough, unspecified: Secondary | ICD-10-CM | POA: Diagnosis present

## 2024-07-01 DIAGNOSIS — J4541 Moderate persistent asthma with (acute) exacerbation: Secondary | ICD-10-CM | POA: Diagnosis not present

## 2024-07-01 MED ORDER — BENZONATATE 100 MG PO CAPS
100.0000 mg | ORAL_CAPSULE | Freq: Three times a day (TID) | ORAL | 0 refills | Status: AC | PRN
  Filled 2024-07-01: qty 30, 10d supply, fill #0

## 2024-07-01 MED ORDER — LEVALBUTEROL TARTRATE 45 MCG/ACT IN AERO
2.0000 | INHALATION_SPRAY | Freq: Four times a day (QID) | RESPIRATORY_TRACT | 1 refills | Status: AC | PRN
  Filled 2024-07-01: qty 15, 30d supply, fill #0

## 2024-07-01 MED ORDER — IPRATROPIUM BROMIDE 0.02 % IN SOLN
0.5000 mg | Freq: Four times a day (QID) | RESPIRATORY_TRACT | 12 refills | Status: AC
  Filled 2024-07-01: qty 75, 8d supply, fill #0
  Filled 2024-08-12: qty 75, 8d supply, fill #1
  Filled 2024-08-27: qty 75, 8d supply, fill #2

## 2024-07-01 NOTE — Telephone Encounter (Signed)
 FYI Pt is scheduled to see a PA today at another office

## 2024-07-01 NOTE — Patient Instructions (Signed)
 Asthma exacerbation with possible acute bronchitis Asthma exacerbation likely due to environmental triggers. Symptoms suggest worsening condition. Differential includes acute bronchitis. Discussed prednisone  side effects and considered lower dose. Preferred levalbuterol  over albuterol  due to heart rate concerns. - Order chest x-ray to evaluate for infection or bronchitis. - Pt pcp prescribe prednisone  20 mg once daily for 7 days. - Refill levalbuterol  inhaler. - Prescribe ipratropium nebulizer solution every 6 hours. - Prescribe benzonatate  for cough management. - Advise to use levalbuterol  inhaler as needed, avoiding albuterol  due to heart rate concerns. - Instruct to go to the emergency department if experiencing significant chest pain or adverse reactions to medications.  Follow up later this week thursday or friday with pcp or sooner if needed

## 2024-07-01 NOTE — Telephone Encounter (Signed)
 FYI Only or Action Required?: Action required by provider: request for appointment and clinical question for provider.  Patient was last seen in primary care on 06/28/2024 by Samantha Garnette HERO, MD.  Called Nurse Triage reporting Breathing Problem and Medication Problem.  Symptoms began several days ago.  Interventions attempted: Nothing.  Symptoms are: unchanged.  Triage Disposition: See PCP When Office is Open (Within 3 Days)  Patient/caregiver understands and will follow disposition?: Yes   Copied from CRM #[id number]. Topic: Clinical - Red Word Triage >> Jul 01, 2024  9:45 AM [place] J wrote: [facility] that prompted transfer to Nurse Triage: Patient was prescribed a medication that made her blood pressure shoot up, going as high as 150/95. She is also having problems breathing, along with chest pain. Reason for Disposition  [1] MODERATE longstanding difficulty breathing (e.g., speaks in phrases, SOB even at rest, pulse 100-120) AND [2] SAME as normal  Answer Assessment - Initial Assessment Questions No available appts with pcp. Scheduled appt with alternative provider, 07/01/24. Patient requests work in visit with pcp.  Patient reports had allergic reaction to methylPREDNISolone  sodium succinate (SOLU-MEDROL ) injection, LOV 06/28/24. Pateint does not want prednisone  medication. Reports redness, headache, elevated blood pressure.   Requesting alternative medication and or work in visit today.  1. RESPIRATORY STATUS: Describe your breathing? (e.g., wheezing, shortness of breath, unable to speak, severe coughing)    Shortness of breath,  Wheezing, coughing; non productive, seen in clinic 06/28/24 2. ONSET: When did this breathing problem begin?     Week ago 3. PATTERN Does the difficult breathing come and go, or has it been constant since it started?      constant 4. SEVERITY: How bad is your breathing? (e.g., mild, moderate, severe)      moderate 6. CARDIAC HISTORY: Do you  have any history of heart disease? (e.g., heart attack, angina, bypass surgery, angioplasty)      no 7. LUNG HISTORY: Do you have any history of lung disease?  (e.g., pulmonary embolus, asthma, emphysema)     asthma 8. CAUSE: What do you think is causing the breathing problem?      LOV 06/28/24; 9. OTHER SYMPTOMS: Do you have any other symptoms? (e.g., chest pain, cough, dizziness, fever, runny nose)     Chest pain only when coughing a lot, runny nose, chest congestion, feels tired Denies dizziness, blurred vision, diff walking/standing, fever, chills, n/v  Protocols used: Breathing Difficulty-A-AH

## 2024-07-01 NOTE — Progress Notes (Signed)
 Subjective:    Patient ID: Samantha [name], female    DOB: 1970/11/06, 53 y.o.   MRN: [id number]  HPI  Pt been seen past Friday  by pcp   Chief Complaint  Patient presents with   Hypertension      2 month f/u HTN.  C/o having some asthma issues (construction around home).     History of Present Illness:   Patient is in today for reassessment of chronic medical issues.   Samantha [name] has a history of hypertension and is managed on atenolol  25 mg 1/2 tab daily. She notes her pressure was up this morning. She is unsure if this relates to her headache onset or not.    Samantha [name] has a history of hyperlipidemia. She is managed on rosuvastatin  10 mg daily.   Samantha [name] has a history of asthma and allergy symptoms. She is followed by Dr. Bennetta (allergist).  She notes that her asthma has been recently flared. She feels this relates to construction going on near her home. she showed me a vidoe showing active grading work near her home with considerable dust into the air. Samantha [name] notes she awakened last night barely able to breathe. She was able to take 4 puffs of her AirSupra  which did help to relieve her symptoms.   Essential hypertension     Samantha [name]'s blood pressure is in good control. Continue atenolol  25 mg 1/2 tab daily.        Relevant Medications    rosuvastatin  (CRESTOR ) 10 MG tablet        Genitourinary    Chronic kidney disease, stage 3a (HCC)    Stable. Continue focus on blood pressure control, adequate hydration, and avoidance of nephrotoxic medications.              Other    Borderline hyperlipidemia    LDL cholesterol is at goal. Continue rosuvastatin  10 mg daily.        Relevant Medications    rosuvastatin  (CRESTOR ) 10 MG tablet    Other Visit Diagnoses         Moderate persistent asthma with acute exacerbation    -  Primary    I will provide an injeciton of Solu-Medrol  125 mg IM today. I will prescribe a course of prednisone . She should  continue use of AirSupra  q 4 hours as needed.    Relevant Medications    methylPREDNISolone  sodium succinate (SOLU-MEDROL ) 125 mg/2 mL injection 125 mg (Completed)    predniSONE  (DELTASONE ) 20 MG tablet           Return in about 2 months (around 08/28/2024) for Reassessment.   Samantha [name] is a 53 year old female with asthma who presents with coughing and wheezing.  She has been experiencing coughing and wheezing for more than a week, which she attributes to dust exposure from a nearby construction site. The site involves tree cutting and grinding, leading to significant dust exposure, and she lives directly across from it, making it difficult to avoid the dust.  She received a steroid injection on Friday, which caused facial flushing and increased heart rate. She was prescribed prednisone  20 mg but has not started it due to concerns about side effects. She has been using her regular asthma medications, including Air Supra, four to five times a day, two puffs each time, and levalbuterol  as a preemptive measure when going outside.  She feels worse than on Friday, with tightness and wheezing. No mucus production when  coughing, but she has a slight runny nose. Her blood pressure has been fluctuating, with a high of 150/98 and more stable readings around 127/74. She is concerned about developing bronchitis or pneumonia.  Her past medical history includes asthma, with management involving medications such as albuterol  and levalbuterol . She has a history of reactions to certain steroids, including triamcinolone  and fluticasone , but has used prednisone  in the past with recent issues. She is cautious about using medications that may increase her heart rate.   Review of Systems  Constitutional:  Negative for chills, fatigue and fever.  HENT:  Negative for congestion and ear discharge.   Respiratory:  Positive for cough, chest tightness and wheezing.   Cardiovascular:  Negative for chest  pain and palpitations.  Gastrointestinal:  Negative for abdominal pain.  Genitourinary:  Negative for dysuria.  Musculoskeletal:  Negative for back pain and myalgias.  Skin:  Negative for rash.  Neurological:  Negative for dizziness, syncope, weakness and light-headedness.  Hematological:  Negative for adenopathy.  Psychiatric/Behavioral:  Negative for agitation and decreased concentration.     Past Medical History:  Diagnosis Date   ADD (attention deficit disorder)    Allergy    Anaphylaxis    Anemia    Anxiety    Arthritis    Asthma    Cataract    Chronic back pain    Chronic kidney disease    Chronic neck pain    Clotting disorder 2005   Complication of anesthesia    Waking up before procedure was over.   Degenerative disc disease at L5-S1 level    Dysrhythmia    Had  abnormal beats d/t B/P med she was on. B/P med reduced.   Endometriosis    Familial cold urticaria    Family history of adverse reaction to anesthesia    Fibromyalgia    GERD (gastroesophageal reflux disease)    Headache    Migraines   Hernia    Hyperlipidemia    Hypersomnia    Hypertension    IBS (irritable bowel syndrome)    IC (interstitial cystitis)    IgG Gliadin antibody positive    Low blood potassium    Migraines    Multiple allergies    OCD (obsessive compulsive disorder)    Raynaud disease    Raynaud's disease    Umbilical hernia    Vertigo    Vertigo      Social History   Socioeconomic History   Marital status: Married    Spouse name: Not on file   Number of children: 1   Years of education: Not on file   Highest education level: Associate degree: academic program  Occupational History   Not on file  Tobacco Use   Smoking status: Never   Smokeless tobacco: Never  Vaping Use   Vaping status: Never Used  Substance and Sexual Activity   Alcohol use: Yes    Comment: occasional   Drug use: No   Sexual activity: Yes    Birth control/protection: Post-menopausal  Other  Topics Concern   Not on file  Social History Narrative   Not on file   Social Drivers of Health   Financial Resource Strain: Low Risk  (06/27/2024)   Overall Financial Resource Strain (CARDIA)    Difficulty of Paying Living Expenses: Not hard at all  Food Insecurity: No Food Insecurity (06/27/2024)   Hunger Vital Sign    Worried About Running Out of Food in the Last Year: Never true  Ran Out of Food in the Last Year: Never true  Transportation Needs: No Transportation Needs (06/27/2024)   PRAPARE - Administrator, Civil Service (Medical): No    Lack of Transportation (Non-Medical): No  Physical Activity: Insufficiently Active (06/27/2024)   Exercise Vital Sign    Days of Exercise per Week: 2 days    Minutes of Exercise per Session: 30 min  Stress: Stress Concern Present (06/27/2024)   [employer] of Occupational Health - Occupational Stress Questionnaire    Feeling of Stress: Very much  Social Connections: Moderately Isolated (06/27/2024)   Social Connection and Isolation Panel    Frequency of Communication with Friends and Family: More than three times a week    Frequency of Social Gatherings with Friends and Family: Never    Attends Religious Services: Never    Database administrator or Organizations: No    Attends Banker Meetings: Not on file    Marital Status: Married  Intimate Partner Violence: Not At Risk (08/27/2020)   Received from [facility] visits prior to 11/12/2022.   Humiliation, Afraid, Rape, and Kick questionnaire    Within the last year, have you been afraid of your partner or ex-partner?: No    Within the last year, have you been humiliated or emotionally abused in other ways by your partner or ex-partner?: No    Within the last year, have you been kicked, hit, slapped, or otherwise physically hurt by your partner or ex-partner?: No    Within the last year, have you been raped or forced to have any kind of  sexual activity by your partner or ex-partner?: No    Past Surgical History:  Procedure Laterality Date   BLADDER SURGERY     age 24- 70   CERVICAL FUSION     08/1998, 03/10/2017   CESAREAN SECTION     COLONOSCOPY     COMBINED HYSTEROSCOPY DIAGNOSTIC / D&C     CYSTO WITH HYDRODISTENSION N/A 02/22/2023   Procedure: HYDRODISTENSION;  Surgeon: Lovie Arlyss CROME, MD;  Location: [facility];  Service: Urology;  Laterality: N/A;   CYSTOSCOPY W/ RETROGRADES N/A 02/22/2023   Procedure: CYSTOSCOPY WITH BILATERAL RETROGRADE PYELOGRAM;  Surgeon: Lovie Arlyss CROME, MD;  Location: [facility];  Service: Urology;  Laterality: N/A;  60 MINUTES NEEDED FOR CASE   CYSTOSCOPY WITH URETHRAL DILATATION N/A 02/22/2023   Procedure: URETHRAL DILATATION;  Surgeon: Lovie Arlyss CROME, MD;  Location: [facility];  Service: Urology;  Laterality: N/A;   HAND SURGERY     2012, 2013   LAPAROSCOPIC ENDOMETRIOSIS FULGURATION Right    LESION REMOVAL  08/23/2012   Procedure: LESION REMOVAL HAND;  Surgeon: Lamar LULLA Leonor Mickey., MD;  Location: [facility];  Service: Orthopedics;  Laterality: Left;   ROTATOR CUFF REPAIR     SPINE SURGERY  [id number]   UPPER GASTROINTESTINAL ENDOSCOPY      Family History  Problem Relation Age of Onset   Asthma Mother    Clotting disorder Mother    Heart attack Mother    Hypertension Mother    Cancer Father        Multiple myeloma   Anemia Father    Arrhythmia Father    Heart attack Father    Heart disease Father    Hyperlipidemia Father    Hypertension Father    ADD / ADHD Brother    Arthritis Brother    Learning disabilities Brother    Stomach cancer  Maternal Grandfather    Asthma Maternal Grandfather    Heart attack Maternal Grandfather    Cancer Maternal Grandmother    Kidney disease Maternal Grandmother    Heart attack Paternal Grandfather    Diabetes Paternal Grandmother    ADD / ADHD Son    Anxiety disorder Son    Hearing loss Son    Learning disabilities Son    Cancer  Paternal Uncle    COPD Paternal Uncle    Colon cancer Neg Hx    Esophageal cancer Neg Hx    Rectal cancer Neg Hx     Allergies  Allergen Reactions   Acetaminophen  Rash    Blistering lymph nodes, and blistering rash    Airduo Digihaler  [Fluticasone -Salmeterol] Hives, Shortness Of Breath and Itching    Asthma attack   Allegra  [Fexofenadine ] Cough    She reports tachycardia and elevated BP and low Oxygen level after taking this   Aspirin Shortness Of Breath    High dose brings asthma attack    B-12 [Cyanocobalamin ] Shortness Of Breath and Itching    Drop O2   Baclofen  Other (See Comments)    Headache, muscle weakness, unable to urinate, unable to walk    Bactrim Anaphylaxis   Bee Venom Hives, Itching, Other (See Comments) and Rash   Benadryl  [Diphenhydramine ] Anaphylaxis    Only the liquid form she can not take because it causes throat closing,  DO NOT USE IN IV FORM - tablets are ok   Ciprofloxacin Shortness Of Breath    Blurred Vision, joint pain, headaches, dropped BP   Clindamycin Shortness Of Breath   Coconut (Cocos Nucifera) Hives, Itching and Other (See Comments)    Coconut Oil    Coq-10 [Coenzyme Q10] Hives, Shortness Of Breath and Itching   Cymbalta [Duloxetine Hcl] Anaphylaxis and Shortness Of Breath   Darvon [Propoxyphene] Hives, Shortness Of Breath and Itching    Darvocet*   Estradiol  Rash and Nausea Only    Lethargy, abdominal and joint pain, nausea, and rash with Estradiol  vaginal cream   Flunisolide  Shortness Of Breath and Other (See Comments)    Aerospan  Nasal Inhaler* - caused blood clots in nose    Fluticasone  Furoate Anaphylaxis, Cough, Nausea And Vomiting, Palpitations and Shortness Of Breath   Formaldehyde Anaphylaxis   Hydrocodone-Acetaminophen  Hives, Shortness Of Breath and Itching    Vicodin, Norco   Nucynta [Tapentadol] Shortness Of Breath    Coughing, SOB    Other Hives, Itching, Rash and Other (See Comments)    Insect stings/ Bites  Wine  spirit  Peanuts - Bring on Asthma attack   Cold Temperatures  - causes hives, and muscle spasms    Peanut-Containing Drug Products Hives, Shortness Of Breath, Itching, Swelling and Other (See Comments)   Potassium-Containing Compounds Anaphylaxis, Itching, Swelling and Palpitations    Tables, and Oral Solution    Ramipril Anaphylaxis   Skelaxin  [Metaxalone ] Hives, Shortness Of Breath and Itching    Brings on Asthma Attacks    Sulfa Antibiotics Anaphylaxis   Sulfamethoxazole-Trimethoprim Anaphylaxis   Tezspire  [Tezepelumab ] Shortness Of Breath, Nausea And Vomiting, Swelling, Rash and Other (See Comments)    Muscle weakness, joint pain, sore throat   Triamcinolone  Other (See Comments)    Kenalog  Injection - muscle weakness, spasms, BP drop, lethargic, tongue swelling   Nasacort  causes breathing issues    Voltaren [Diclofenac] Shortness Of Breath, Swelling and Rash    Gel*    Zofran  [Ondansetron  Hcl] Hives and Itching    Blurred Vision  Aspartame Other (See Comments)    Severe Migraines   Bacitra-Neomycin-Polymyxin-Hc Rash   Cranberry Extract Other (See Comments)    Throat itching / burning    Sulfur Hives and Itching   Aciphex  [Rabeprazole ]     Muscle weakness, headaches    Addyi [Flibanserin]     Decreased sex drive to 0   Alvesco  [Ciclesonide ] Other (See Comments)    Sores in mouth, coughing    Ambien [Zolpidem]     Mood Change, Aggressive, Insomnia    Amoxicillin      Pt unsure of Allergy    Ativan [Lorazepam] Other (See Comments)    Hallucination     Aygestin [Norethindrone]     Muscle weakness, abdominal pain   Celebrex [Celecoxib] Other (See Comments)    Possible Reaction - Not taken    Demeclocycline     Tetracycline Family-  GI Upset   Dexilant [Dexlansoprazole]     Severe joint pain    Doxycycline     Possibly joint pain or itch    Effexor Xr [Venlafaxine Hcl Er]     Migraines, weight gain, trouble sleeping   Elavil [Amitriptyline] Nausea And Vomiting     Headache, and muscle weakness   Erythromycin     Upset stomach   Erythromycin Base     Stomach Upset    Fentanyl  Dermatitis    Patches*    Fioricet [Butalbital-Apap-Caffeine]     Acetaminophen     Hydrocodone Itching   Hydrocodone Bit-Homatrop Mbr Hives and Itching    Not true allergy. irritated   Hydromorphone  Nausea Only   Inderal [Propranolol]     Agitation, difficulty sleeping    Indomethacin Hives and Itching   Latex Itching    Skin Peeling    Levofloxacin     Weakness, myalgia   Lyrica [Pregabalin]     Muscle weakness, Causes Falls    Maxalt [Rizatriptan] Other (See Comments)    Pt was knocked out from med, extreme drowsiness and did not help migraine    Meloxicam Itching    Increases BP   Morphine  Sulfate Er     Severe insomnia, does not help pain    Naproxen Hives and Itching    Head to Toe    Neurontin [Gabapentin]     Depression    Nexium [Esomeprazole]     Bad headaches    Nitrofuran Derivatives    Nortriptyline     Depression, decreased sex drive    Omeprazole-Sodium Bicarbonate     Chest pain-sore all over-HA   Ranitidine     Severe joint pain   Robaxin  [Methocarbamol ]     Made her very tired and unable to sleep at night Muscle weakness, fatigue    Tape Dermatitis   Tetanus Toxoid Other (See Comments)    Tetanus injection - blistering rash, swelling at injection site, turn into baseball size lump, joint pain    Tetanus-Diphtheria Toxoids Td Hives    Robust local reaction   Tetracyclines & Related     GI Upset    Tizanidine Other (See Comments)    knocked her out- had a rebound migraine, causes muscles to lock, and bedridden    Topamax [Topiramate]     Lethargic, daytime drowsiness, insomnia    Trazodone     Pt thinks it did not help - unsure of reaction    Tums [Calcium  Carbonate] Other (See Comments)    Blistering Rash with high doses    Wound Dressing Adhesive Itching and Dermatitis  Bandages    Etodolac  Rash    Blistering Rash, and  swollen lymph nodes    Flexeril  [Cyclobenzaprine ] Palpitations    Irregular heartbeat    Ibuprofen Itching, Hives, Rash and Swelling    Throat itching   Neosporin [Neomycin-Bacitracin Zn-Polymyx] Rash    Blistering     Current Outpatient Medications on File Prior to Visit  Medication Sig Dispense Refill   AIRSUPRA  90-80 MCG/ACT AERO Inhale 2 puffs into the lungs every 6 (six) hours as needed. 10.7 g 2   albuterol  (VENTOLIN  HFA) 108 (90 Base) MCG/ACT inhaler INHALE 2 PUFFS BY MOUTH EVERY 4 HOURS AS NEEDED FOR WHEEZING FOR SHORTNESS OF BREATH 18 g 0   ALPRAZolam  (XANAX ) 1 MG tablet Take 1 tablet (1 mg total) by mouth 2 (two) times daily as needed. 20 tablet 2   Alum Hydroxide-Mag Trisilicate (GAVISCON) 80-14.2 MG CHEW Chew 2 tablets by mouth as needed (acid reflux).     aspirin EC 81 MG tablet Take 81 mg by mouth daily. Swallow whole.     atenolol  (TENORMIN ) 25 MG tablet Take 0.5 tablets (12.5 mg total) by mouth daily. 90 tablet 1   AUVI-Q  0.3 MG/0.3ML SOAJ injection Inject 0.3 mg into the muscle as needed for anaphylaxis. As directed for life-threatening allergic reactions 2 each 1   azelastine  (ASTELIN ) 0.1 % nasal spray Place 2 sprays into each nostril 1-2 times per day. 30 mL 5   benzonatate  (TESSALON ) 200 MG capsule Take 1 capsule (200 mg total) by mouth 3 (three) times daily as needed. 21 capsule 0   budesonide  (PULMICORT ) 0.25 MG/2ML nebulizer solution Take 0.25 mg by nebulization 2 (two) times daily as needed (Asthma).     Cholecalciferol (VITAMIN D ) 50 MCG (2000 UT) CAPS Take 2,000 Units by mouth daily.     cimetidine (TAGAMET) 200 MG tablet Take 200 mg by mouth 2 (two) times daily.     diazepam  (VALIUM ) 10 MG tablet Take 1 tablet (10 mg total) by mouth every 6 (six) hours as needed for anxiety or sleep (vertigo/muscle spasms). 30 tablet 2   EPINEPHrine  (PRIMATENE  MIST) 0.125 MG/ACT AERO Inhale 1 puff into the lungs as needed (Asthma).     estradiol  (ESTRACE ) 0.1 MG/GM vaginal cream  Place 1 gram vaginally 2 (two) times a week at bedtime for 90 days. 42.5 g 3   famotidine (PEPCID AC MAXIMUM STRENGTH) 20 MG tablet Take 20 mg by mouth daily as needed (Hives/itching).     guaifenesin (HUMIBID E) 400 MG TABS tablet Take 400-800 mg by mouth every 4 (four) hours as needed (Cough/mucus).     ipratropium (ATROVENT ) 0.06 % nasal spray Place 2 sprays into each nostril 1-2 times per day. 15 mL 5   magnesium oxide (MAG-OX) 400 (240 Mg) MG tablet Take 400 mg by mouth daily.     methylphenidate  (RITALIN ) 10 MG tablet Take 1 tablet (10 mg total) by mouth 3 (three) times daily with meals. 90 tablet 0   Olopatadine HCl (PATADAY) 0.2 % SOLN Place 1 drop into both eyes 2 (two) times daily as needed (eye allergies).     rosuvastatin  (CRESTOR ) 10 MG tablet Take 1 tablet (10 mg total) by mouth daily. 90 tablet 3   SUMAtriptan  (IMITREX ) 50 MG tablet Take 1 tablet (50 mg total) by mouth once as needed. May repeat in 2 hours if headache persists or recurs. 10 tablet 3   mirabegron  ER (MYRBETRIQ ) 25 MG TB24 tablet Take 1 tablet (25 mg total) by mouth  daily. (Patient not taking: Reported on 07/01/2024) 30 tablet 11   predniSONE  (DELTASONE ) 20 MG tablet Take 1 tablet (20 mg total) by mouth daily with breakfast. 7 tablet 0   No current facility-administered medications on file prior to visit.    BP 130/88   Pulse 60   Temp 97.8 F (36.6 C) (Oral)   Resp 15   Ht 5' 2 (1.575 m)   Wt 157 lb 6.4 oz (71.4 kg)   LMP  (LMP Unknown)   SpO2 98%   BMI 28.79 kg/m        Objective:   Physical Exam  General- No acute distress. Pleasant patient. Neck- Full range of motion, no jvd Lungs- tight, shallow, even and unlabored. Expiratory wheeze throughout all lobes. Heart- regular rate and rhythm. Neurologic- CNII- XII grossly intact.  Heent- no sinus pressure. Lower ext- calfs symmetic, no pedal edema. Negative homans signs bilaterally.       Assessment & Plan:  Asthma exacerbation with possible  acute bronchitis Asthma exacerbation likely due to environmental triggers. Symptoms suggest worsening condition. Differential includes acute bronchitis. Discussed prednisone  side effects and considered lower dose. Preferred levalbuterol  over albuterol  due to heart rate concerns. - Order chest x-ray to evaluate for infection or bronchitis. - Pt pcp prescribe prednisone  20 mg once daily for 7 days. - Refill levalbuterol  inhaler. - Prescribe ipratropium nebulizer solution every 6 hours. - Prescribe benzonatate  for cough management. - Advise to use levalbuterol  inhaler as needed, avoiding albuterol  due to heart rate concerns. - Instruct to go to the emergency department if experiencing significant chest pain or adverse reactions to medications.  Follow up later this week thursday or friday with pcp or sooner if needed   [employer], PA-C    I personally spent a total of 40 minutes in the care of the patient today including getting/reviewing separately obtained history, performing a medically appropriate exam/evaluation, counseling and educating, placing orders, and documenting clinical information in the EHR. Extra time taken attempting to contact pcp as well as calling to pharmacy to assure prednisone  rx sent in.

## 2024-07-02 ENCOUNTER — Other Ambulatory Visit: Payer: Self-pay

## 2024-07-02 ENCOUNTER — Other Ambulatory Visit (HOSPITAL_BASED_OUTPATIENT_CLINIC_OR_DEPARTMENT_OTHER): Payer: Self-pay

## 2024-07-02 ENCOUNTER — Encounter (HOSPITAL_BASED_OUTPATIENT_CLINIC_OR_DEPARTMENT_OTHER): Payer: Self-pay

## 2024-07-02 ENCOUNTER — Emergency Department (HOSPITAL_BASED_OUTPATIENT_CLINIC_OR_DEPARTMENT_OTHER)

## 2024-07-02 ENCOUNTER — Emergency Department (HOSPITAL_BASED_OUTPATIENT_CLINIC_OR_DEPARTMENT_OTHER)
Admission: EM | Admit: 2024-07-02 | Discharge: 2024-07-02 | Disposition: A | Discharge status: Home / Self Care | Attending: Emergency Medicine | Admitting: Emergency Medicine

## 2024-07-02 DIAGNOSIS — Z91010 Allergy to peanuts: Secondary | ICD-10-CM | POA: Diagnosis not present

## 2024-07-02 DIAGNOSIS — R0602 Shortness of breath: Secondary | ICD-10-CM | POA: Insufficient documentation

## 2024-07-02 DIAGNOSIS — Z91040 Latex allergy status: Secondary | ICD-10-CM | POA: Insufficient documentation

## 2024-07-02 DIAGNOSIS — R059 Cough, unspecified: Secondary | ICD-10-CM | POA: Diagnosis not present

## 2024-07-02 DIAGNOSIS — Z7982 Long term (current) use of aspirin: Secondary | ICD-10-CM | POA: Diagnosis not present

## 2024-07-02 LAB — BASIC METABOLIC PANEL WITH GFR
Anion gap: 11 (ref 5–15)
BUN: 23 mg/dL — ABNORMAL HIGH (ref 6–20)
CO2: 25 mmol/L (ref 22–32)
Calcium: 9.6 mg/dL (ref 8.9–10.3)
Chloride: 105 mmol/L (ref 98–111)
Creatinine, Ser: 0.95 mg/dL (ref 0.44–1.00)
GFR, Estimated: >60 mL/min (ref >60)
Glucose, Bld: 121 mg/dL — ABNORMAL HIGH (ref 70–99)
Potassium: 4.1 mmol/L (ref 3.5–5.1)
Sodium: 141 mmol/L (ref 135–145)

## 2024-07-02 LAB — CBC
HCT: 39 % (ref 36.0–46.0)
Hemoglobin: 13.6 g/dL (ref 12.0–15.0)
MCH: 30.6 pg (ref 26.0–34.0)
MCHC: 34.9 g/dL (ref 30.0–36.0)
MCV: 87.6 fL (ref 80.0–100.0)
Platelets: 223 K/uL (ref 150–400)
RBC: 4.45 MIL/uL (ref 3.87–5.11)
RDW: 11.9 % (ref 11.5–15.5)
WBC: 8.7 K/uL (ref 4.0–10.5)
nRBC: 0 % (ref 0.0–0.2)

## 2024-07-02 MED ORDER — AZITHROMYCIN 250 MG PO TABS
ORAL_TABLET | ORAL | 0 refills | Status: AC
  Filled 2024-07-02: qty 6, 5d supply, fill #0

## 2024-07-02 NOTE — Progress Notes (Signed)
 Last read by Ethelene Vicci Batter at 10:42AM on 07/02/2024.

## 2024-07-02 NOTE — ED Triage Notes (Addendum)
 Reports SHOB for over 1 week, cough. Audible inhale, RRT in triage  Whispering when talking, [facility] during conversation   Recent dx of bronchitis

## 2024-07-02 NOTE — ED Notes (Signed)
 ED Provider at bedside.

## 2024-07-02 NOTE — Addendum Note (Signed)
 Addended by: DORINA DALLAS HERO on: 07/02/2024 08:48 PM   Modules accepted: Orders

## 2024-07-02 NOTE — Discharge Instructions (Signed)
 As we discussed, please take the medications as prescribed by your doctor and plan follow up with him later this week. You can stop the prednisone  if you feel symptoms today were related to having taken it. Please discuss that with your doctor.   Return to the ED with any new or worsening symptoms at any time.

## 2024-07-02 NOTE — ED Notes (Signed)
 D/c paperwork reviewed with pt, including follow up care.  All questions and/or concerns addressed at time of d/c.  No further needs expressed. . Pt verbalized understanding, Ambulatory without assistance to ED exit, NAD.

## 2024-07-02 NOTE — ED Provider Notes (Signed)
 [facility] Provider Note   CSN: [id number] Arrival date & time: 07/02/24  1656     Patient presents with: Shortness of Breath   Samantha [name] is a 53 y.o. female.   Patient to ED with sense of shortness of breath and cough for the past week. She was seen by her doctor 5 days ago and given prednisone , which she feels made her blood pressure spike over the last 3-4 days. No fever at any time. She is using Xopenex , Atrovent , prednisone , flovent , budesonide , Astelin  nasal. No vomiting. Today she felt she was getting abruptly worse.   The history is provided by the patient. No language interpreter was used.  Shortness of Breath      Prior to Admission medications   Medication Sig Start Date End Date Taking? Authorizing Provider  AIRSUPRA  90-80 MCG/ACT AERO Inhale 2 puffs into the lungs every 6 (six) hours as needed. 03/27/24   Kozlow, Camellia PARAS, MD  albuterol  (VENTOLIN  HFA) 108 (90 Base) MCG/ACT inhaler INHALE 2 PUFFS BY MOUTH EVERY 4 HOURS AS NEEDED FOR WHEEZING FOR SHORTNESS OF BREATH 09/16/22   Kozlow, Camellia PARAS, MD  ALPRAZolam  (XANAX ) 1 MG tablet Take 1 tablet (1 mg total) by mouth 2 (two) times daily as needed. 06/19/24   Thedora Garnette HERO, MD  Alum Hydroxide-Mag Trisilicate (GAVISCON) 80-14.2 MG CHEW Chew 2 tablets by mouth as needed (acid reflux).    [provider]  aspirin EC 81 MG tablet Take 81 mg by mouth daily. Swallow whole.    [provider]  atenolol  (TENORMIN ) 25 MG tablet Take 0.5 tablets (12.5 mg total) by mouth daily. 03/12/24   Thedora Garnette HERO, MD  AUVI-Q  0.3 MG/0.3ML SOAJ injection Inject 0.3 mg into the muscle as needed for anaphylaxis. As directed for life-threatening allergic reactions 03/27/24   Kozlow, Camellia PARAS, MD  azelastine  (ASTELIN ) 0.1 % nasal spray Place 2 sprays into each nostril 1-2 times per day. 10/02/23   Kozlow, Camellia PARAS, MD  benzonatate  (TESSALON ) 100 MG capsule Take 1 capsule (100 mg total)  by mouth 3 (three) times daily as needed for cough. 07/01/24   Saguier, [place], PA-C  benzonatate  (TESSALON ) 200 MG capsule Take 1 capsule (200 mg total) by mouth 3 (three) times daily as needed. 01/11/24   Nche, Roselie Rockford, NP  budesonide  (PULMICORT ) 0.25 MG/2ML nebulizer solution Take 0.25 mg by nebulization 2 (two) times daily as needed (Asthma).    [provider]  Cholecalciferol (VITAMIN D ) 50 MCG (2000 UT) CAPS Take 2,000 Units by mouth daily.    [provider]  cimetidine (TAGAMET) 200 MG tablet Take 200 mg by mouth 2 (two) times daily.    [provider]  diazepam  (VALIUM ) 10 MG tablet Take 1 tablet (10 mg total) by mouth every 6 (six) hours as needed for anxiety or sleep (vertigo/muscle spasms). 06/19/24   Thedora Garnette HERO, MD  EPINEPHrine  (PRIMATENE  MIST) 0.125 MG/ACT AERO Inhale 1 puff into the lungs as needed (Asthma).    [provider]  estradiol  (ESTRACE ) 0.1 MG/GM vaginal cream Place 1 gram vaginally 2 (two) times a week at bedtime for 90 days. 07/13/23     famotidine (PEPCID AC MAXIMUM STRENGTH) 20 MG tablet Take 20 mg by mouth daily as needed (Hives/itching). 09/16/22   [provider]  guaifenesin (HUMIBID E) 400 MG TABS tablet Take 400-800 mg by mouth every 4 (four) hours as needed (Cough/mucus).    [provider]  ipratropium (ATROVENT ) 0.02 % nebulizer solution Take 2.5 mLs (0.5 mg total) by nebulization 4 (four) times daily. 07/01/24   Saguier, Dallas, PA-C  ipratropium (ATROVENT ) 0.06 % nasal spray Place 2 sprays into each nostril 1-2 times per day. 04/26/24   Kozlow, Camellia PARAS, MD  levalbuterol  (XOPENEX  HFA) 45 MCG/ACT inhaler Inhale 2 puffs into the lungs every 6 (six) hours as needed for wheezing. 07/01/24   Saguier, [place], PA-C  magnesium oxide (MAG-OX) 400 (240 Mg) MG tablet Take 400 mg by mouth daily.    [provider]  methylphenidate  (RITALIN ) 10 MG tablet Take 1 tablet (10 mg total) by mouth 3 (three) times  daily with meals. 05/29/24 07/01/24  Webb, Padonda B, FNP  mirabegron  ER (MYRBETRIQ ) 25 MG TB24 tablet Take 1 tablet (25 mg total) by mouth daily. Patient not taking: Reported on 07/01/2024 06/06/24   Roseann Adine PARAS., MD  Olopatadine HCl (PATADAY) 0.2 % SOLN Place 1 drop into both eyes 2 (two) times daily as needed (eye allergies).    [provider]  predniSONE  (DELTASONE ) 20 MG tablet Take 1 tablet (20 mg total) by mouth daily with breakfast. 06/28/24   Thedora Garnette HERO, MD  rosuvastatin  (CRESTOR ) 10 MG tablet Take 1 tablet (10 mg total) by mouth daily. 06/28/24   Thedora Garnette HERO, MD  SUMAtriptan  (IMITREX ) 50 MG tablet Take 1 tablet (50 mg total) by mouth once as needed. May repeat in 2 hours if headache persists or recurs. 05/31/24   Webb, Padonda B, FNP    Allergies: Acetaminophen , Airduo digihaler  [fluticasone -salmeterol], Allegra  [fexofenadine ], Aspirin, B-12 [cyanocobalamin ], Baclofen , Bactrim, Bee venom, Benadryl  [diphenhydramine ], Ciprofloxacin, Clindamycin, Coconut (cocos nucifera), Coq-10 [coenzyme q10], Cymbalta [duloxetine hcl], Darvon [propoxyphene], Estradiol , Flunisolide , Fluticasone  furoate, Formaldehyde, Hydrocodone-acetaminophen , Nucynta [tapentadol], Other, Peanut-containing drug products, Potassium-containing compounds, Ramipril, Skelaxin  [metaxalone ], Sulfa antibiotics, Sulfamethoxazole-trimethoprim, Tezspire  [tezepelumab ], Triamcinolone , Voltaren [diclofenac], Zofran  [ondansetron  hcl], Aspartame, Bacitra-neomycin-polymyxin-hc, Cranberry extract, Sulfur, Aciphex  [rabeprazole ], Addyi [flibanserin], Alvesco  [ciclesonide ], Ambien [zolpidem], Amoxicillin , Ativan [lorazepam], Aygestin [norethindrone], Celebrex [celecoxib], Demeclocycline, Dexilant [dexlansoprazole], Doxycycline, Effexor xr [venlafaxine hcl er], Elavil [amitriptyline], Erythromycin, Erythromycin base, Fentanyl , Fioricet [butalbital-apap-caffeine], Hydrocodone, Hydrocodone bit-homatrop mbr, Hydromorphone , Inderal  [propranolol], Indomethacin, Latex, Levofloxacin, Lyrica [pregabalin], Maxalt [rizatriptan], Meloxicam, Morphine  sulfate er, Naproxen, Neurontin [gabapentin], Nexium [esomeprazole], Nitrofuran derivatives, Nortriptyline, Omeprazole-sodium bicarbonate, Ranitidine, Robaxin  [methocarbamol ], Tape, Tetanus toxoid, Tetanus-diphtheria toxoids td, Tetracyclines & related, Tizanidine, Topamax [topiramate], Trazodone, Tums [calcium  carbonate], Wound dressing adhesive, Etodolac , Flexeril  [cyclobenzaprine ], Ibuprofen, and Neosporin [neomycin-bacitracin zn-polymyx]    Review of Systems  Respiratory:  Positive for shortness of breath.     Updated Vital Signs BP 139/74   Pulse 60   Temp 97.9 F (36.6 C) (Oral)   Resp 12   LMP  (LMP Unknown)   SpO2 98%   Physical Exam Vitals and nursing note reviewed.  Constitutional:      General: She is not in acute distress.    Appearance: She is well-developed.     Comments: Speaking in whispers but able to complete sentences.   Cardiovascular:     Rate and Rhythm: Normal rate and regular rhythm.  Pulmonary:     Effort: Pulmonary effort is normal.     Breath sounds: No decreased breath sounds, rhonchi or rales.     Comments: Breath sounds to all fields. There is expiratory wheezes left lobes, most likely upper airway origin.  Abdominal:     Palpations: Abdomen is soft.  Musculoskeletal:        General: Normal range of motion.     Cervical back: Normal  range of motion.     Right lower leg: No edema.     Left lower leg: No edema.  Skin:    General: Skin is warm and dry.  Neurological:     General: No focal deficit present.     Mental Status: She is alert and oriented to person, place, and time.     (all labs ordered are listed, but only abnormal results are displayed) Labs Reviewed  BASIC METABOLIC PANEL WITH GFR - Abnormal; Notable for the following components:      Result Value   Glucose, Bld 121 (*)    BUN 23 (*)    All other components within  normal limits  CBC    EKG: EKG Interpretation Date/Time:  Tuesday July 02 2024 17:08:00 EDT Ventricular Rate:  60 PR Interval:  129 QRS Duration:  112 QT Interval:  393 QTC Calculation: 393 R Axis:   11  Text Interpretation: Sinus rhythm Ventricular premature complex Borderline intraventricular conduction delay Confirmed by Cottie Cough [id number]) on 07/02/2024 5:36:23 PM  Radiology: DG Chest 2 View Result Date: 07/02/2024 EXAM: 2 VIEW(S) XRAY OF THE CHEST 07/02/2024 05:45:00 PM COMPARISON: 07/01/2024 CLINICAL HISTORY: SHOB. Reports SHOB for over 1 week, cough. Recent diagnosis of bronchitis. FINDINGS: LUNGS AND PLEURA: Left basilar atelectasis. No pulmonary edema. No pleural effusion. No pneumothorax. HEART AND MEDIASTINUM: No acute abnormality of the cardiac and mediastinal silhouettes. BONES AND SOFT TISSUES: ACDF hardware noted. IMPRESSION: 1. No acute cardiopulmonary process . Electronically signed by: Norman Gatlin MD 07/02/2024 06:05 PM EDT RP Workstation: [id number]   DG Chest 2 View Result Date: 07/01/2024 CLINICAL DATA:  One-week history of asthma flare and central chest pain EXAM: CHEST - 2 VIEW COMPARISON:  Chest radiograph dated 07/04/2023 FINDINGS: Normal lung volumes. Hazy opacity projecting over the right middle lobe and lingula. No pleural effusion or pneumothorax. The heart size and mediastinal contours are within normal limits. Cervical spinal fixation hardware appears intact. IMPRESSION: Hazy opacity projecting over the right middle lobe and lingula, which may represent atelectasis or pneumonia. Electronically Signed   By: Limin  Xu M.D.   On: 07/01/2024 17:10     Procedures   Medications Ordered in the ED - No data to display  Clinical Course as of 07/02/24 1831  Tue Jul 02, 2024  1825 Patient to ED with c/o worsening SOB. Had been seen by her doctor 5 days ago and yesterday. She was started on prednisone , Xopenex , has atrovent , pulmicort . She was given an  antibiotic yesterday and hasn't picked it up from pharmacy yet.   On arrival the patient spoke in whispers but did not have any objective findings of wheezing, hypoxia, tachycardia or tachypnea. Labs are reassuring, CXR clear. She is observed on the monitor and has no changes. Over time she has improved and feels much better on my re-evaluation. She feels her symptoms were related to having taken prednisone  yesterday and this morning. She remembers that she had similar symptoms when she took it in the past. She will stop the prednisone  at this point. She is encouraged to pick up the antibiotic as prescribed by her doctor and follow up in office later this week.  [SU]    Clinical Course User Index [SU] Odell Balls, PA-C                                 Medical Decision Making Amount and/or Complexity of Data Reviewed Labs:  ordered. Radiology: ordered.        Final diagnoses:  Shortness of breath    ED Discharge Orders     None          Odell Balls, DEVONNA 07/02/24 1831    Cottie Donnice PARAS, MD 07/02/24 2256

## 2024-07-02 NOTE — ED Notes (Signed)
 RT called to assess patient. When I arrived, patient had a very forceful cough and a wheezing noise upper airway on inspiration. She was able to speak to me in complete sentences when I spoke to her, and stated she had taken 2 neb treatments prior to arrival. BBS clear at the moment. I asked her if she had been having a sore throat due to her cough, which could explain the upper airway noise. VS WNL at this time. No treatment given until after PA assessment. Stated she takes Xopenex  due to albuterol  increasing her HR.

## 2024-07-02 NOTE — ED Notes (Signed)
 Pt transported to imaging.

## 2024-07-03 ENCOUNTER — Other Ambulatory Visit (HOSPITAL_BASED_OUTPATIENT_CLINIC_OR_DEPARTMENT_OTHER): Payer: Self-pay

## 2024-07-05 ENCOUNTER — Encounter: Payer: Self-pay | Admitting: Nurse Practitioner

## 2024-07-05 ENCOUNTER — Ambulatory Visit: Admitting: Nurse Practitioner

## 2024-07-05 ENCOUNTER — Ambulatory Visit: Admitting: Medical

## 2024-07-05 ENCOUNTER — Other Ambulatory Visit (HOSPITAL_BASED_OUTPATIENT_CLINIC_OR_DEPARTMENT_OTHER): Payer: Self-pay

## 2024-07-05 VITALS — BP 132/78 | HR 76 | Temp 97.8°F | Ht 62.0 in | Wt 156.6 lb

## 2024-07-05 DIAGNOSIS — J4541 Moderate persistent asthma with (acute) exacerbation: Secondary | ICD-10-CM | POA: Diagnosis not present

## 2024-07-05 MED ORDER — MONTELUKAST SODIUM 10 MG PO TABS
10.0000 mg | ORAL_TABLET | Freq: Every day | ORAL | 5 refills | Status: DC
  Filled 2024-07-05: qty 30, 30d supply, fill #0

## 2024-07-05 NOTE — Progress Notes (Signed)
 Acute Office Visit  Subjective:    Patient ID: Samantha [name], female    DOB: 1971-06-23, 53 y.o.   MRN: [id number]  Chief Complaint  Patient presents with   Follow-up    Recent ED visit for SOB, now having cough, sore throat and sinus pressure, home tested for COVID-negative   HPI Moderate persistent asthma with acute exacerbation Reports daily cough, and wheezing. Some relief with albuterol , airsupra , and pulmicort . CXR on 07/01/2024 indicated possible pneumonia, but CXR on 07/02/2024 was normal without pneumonia. She reports an allergic reaction to oral prednisone  20mg - worsening SOB, sorethroat and chest tightness. This led to an ED visit on 07/02/2024. She opted not to take azithromycin  due to normal repeat CXR. Today she reports persistent cough, and sorethroat. Resolved chest tightness and intermittent wheezing. No fever, no purulent mucus, no GERD symptoms Unable to tolerate any OVER THE COUNTER antihistamine. Previous use of singulair with no adverse effects. Current use of air purifers with hepa filters at home  Start singulair 10mg  at hs Maintain current inhaler and nebulizer treatment F/up with pcp in 13month  Outpatient Medications Prior to Visit  Medication Sig Note   AIRSUPRA  90-80 MCG/ACT AERO Inhale 2 puffs into the lungs every 6 (six) hours as needed.    albuterol  (VENTOLIN  HFA) 108 (90 Base) MCG/ACT inhaler INHALE 2 PUFFS BY MOUTH EVERY 4 HOURS AS NEEDED FOR WHEEZING FOR SHORTNESS OF BREATH    ALPRAZolam  (XANAX ) 1 MG tablet Take 1 tablet (1 mg total) by mouth 2 (two) times daily as needed.    Alum Hydroxide-Mag Trisilicate (GAVISCON) 80-14.2 MG CHEW Chew 2 tablets by mouth as needed (acid reflux).    aspirin EC 81 MG tablet Take 81 mg by mouth daily. Swallow whole.    atenolol  (TENORMIN ) 25 MG tablet Take 0.5 tablets (12.5 mg total) by mouth daily.    AUVI-Q  0.3 MG/0.3ML SOAJ injection Inject 0.3 mg into the muscle as needed for anaphylaxis. As directed  for life-threatening allergic reactions    azelastine  (ASTELIN ) 0.1 % nasal spray Place 2 sprays into each nostril 1-2 times per day.    benzonatate  (TESSALON ) 100 MG capsule Take 1 capsule (100 mg total) by mouth 3 (three) times daily as needed for cough.    budesonide  (PULMICORT ) 0.25 MG/2ML nebulizer solution Take 0.25 mg by nebulization 2 (two) times daily as needed (Asthma).    Cholecalciferol (VITAMIN D ) 50 MCG (2000 UT) CAPS Take 2,000 Units by mouth daily.    cimetidine (TAGAMET) 200 MG tablet Take 200 mg by mouth 2 (two) times daily.    diazepam  (VALIUM ) 10 MG tablet Take 1 tablet (10 mg total) by mouth every 6 (six) hours as needed for anxiety or sleep (vertigo/muscle spasms).    EPINEPHrine  (PRIMATENE  MIST) 0.125 MG/ACT AERO Inhale 1 puff into the lungs as needed (Asthma).    estradiol  (ESTRACE ) 0.1 MG/GM vaginal cream Place 1 gram vaginally 2 (two) times a week at bedtime for 90 days.    famotidine (PEPCID AC MAXIMUM STRENGTH) 20 MG tablet Take 20 mg by mouth daily as needed (Hives/itching).    guaifenesin (HUMIBID E) 400 MG TABS tablet Take 400-800 mg by mouth every 4 (four) hours as needed (Cough/mucus).    ipratropium (ATROVENT ) 0.02 % nebulizer solution Take 2.5 mLs (0.5 mg total) by nebulization 4 (four) times daily.    ipratropium (ATROVENT ) 0.06 % nasal spray Place 2 sprays into each nostril 1-2 times per day.    levalbuterol  (XOPENEX  HFA) 45 MCG/ACT  inhaler Inhale 2 puffs into the lungs every 6 (six) hours as needed for wheezing.    magnesium oxide (MAG-OX) 400 (240 Mg) MG tablet Take 400 mg by mouth daily.    methylphenidate  (RITALIN ) 10 MG tablet Take 1 tablet (10 mg total) by mouth 3 (three) times daily with meals.    mirabegron  ER (MYRBETRIQ ) 25 MG TB24 tablet Take 1 tablet (25 mg total) by mouth daily.    Olopatadine HCl (PATADAY) 0.2 % SOLN Place 1 drop into both eyes 2 (two) times daily as needed (eye allergies).    rosuvastatin  (CRESTOR ) 10 MG tablet Take 1 tablet (10 mg  total) by mouth daily.    SUMAtriptan  (IMITREX ) 50 MG tablet Take 1 tablet (50 mg total) by mouth once as needed. May repeat in 2 hours if headache persists or recurs.    [DISCONTINUED] benzonatate  (TESSALON ) 200 MG capsule Take 1 capsule (200 mg total) by mouth 3 (three) times daily as needed.    [DISCONTINUED] azithromycin  (ZITHROMAX ) 250 MG tablet Take 2 tablets on day 1, then 1 tablet daily on days 2 through 5 (Patient not taking: Reported on 07/05/2024) 07/05/2024: Has not picked up yet     [DISCONTINUED] predniSONE  (DELTASONE ) 20 MG tablet Take 1 tablet (20 mg total) by mouth daily with breakfast. (Patient not taking: Reported on 07/05/2024)    No facility-administered medications prior to visit.   Reviewed past medical and social history.  Review of Systems Per HPI     Objective:    Physical Exam Vitals and nursing note reviewed.  Constitutional:      Appearance: She is not ill-appearing.  HENT:     Head: Normocephalic.     Mouth/Throat:     Pharynx: Uvula midline. Posterior oropharyngeal erythema and postnasal drip present. No pharyngeal swelling, oropharyngeal exudate or uvula swelling.     Tonsils: No tonsillar exudate or tonsillar abscesses.  Cardiovascular:     Rate and Rhythm: Normal rate and regular rhythm.     Pulses: Normal pulses.     Heart sounds: Normal heart sounds.  Pulmonary:     Effort: Pulmonary effort is normal.     Breath sounds: Normal breath sounds.  Lymphadenopathy:     Cervical: No cervical adenopathy.  Neurological:     Mental Status: She is alert.    BP 132/78 (BP Location: Left Arm, Patient Position: Sitting, Cuff Size: Normal)   Pulse 76   Temp 97.8 F (36.6 C) (Temporal)   Ht 5' 2 (1.575 m)   Wt 156 lb 9.6 oz (71 kg)   LMP  (LMP Unknown)   SpO2 98%   BMI 28.64 kg/m    No results found for any visits on 07/05/24.     Assessment & Plan:   Problem List Items Addressed This Visit     Moderate persistent asthma with acute  exacerbation - Primary   Reports daily cough, and wheezing. Some relief with albuterol , airsupra , and pulmicort . CXR on 07/01/2024 indicated possible pneumonia, but CXR on 07/02/2024 was normal without pneumonia. She reports an allergic reaction to oral prednisone  20mg - worsening SOB, sorethroat and chest tightness. This led to an ED visit on 07/02/2024. She opted not to take azithromycin  due to normal repeat CXR. Today she reports persistent cough, and sorethroat. Resolved chest tightness and intermittent wheezing. No fever, no purulent mucus, no GERD symptoms Unable to tolerate any OVER THE COUNTER antihistamine. Previous use of singulair with no adverse effects. Current use of air purifers with hepa filters  at home  Start singulair 10mg  at hs Maintain current inhaler and nebulizer treatment F/up with pcp in 55month      Relevant Medications   montelukast (SINGULAIR) 10 MG tablet   Meds ordered this encounter  Medications   montelukast (SINGULAIR) 10 MG tablet    Sig: Take 1 tablet (10 mg total) by mouth at bedtime.    Dispense:  30 tablet    Refill:  5    Supervising Provider:   BERNETA ELSIE SAYRE [[id number]]   Return in about 4 weeks (around 08/02/2024) for maintain upcoming appt with pcp in December.  Roselie Mood, NP

## 2024-07-05 NOTE — Assessment & Plan Note (Signed)
 Reports daily cough, and wheezing. Some relief with albuterol , airsupra , and pulmicort . CXR on 07/01/2024 indicated possible pneumonia, but CXR on 07/02/2024 was normal without pneumonia. She reports an allergic reaction to oral prednisone  20mg - worsening SOB, sorethroat and chest tightness. This led to an ED visit on 07/02/2024. She opted not to take azithromycin  due to normal repeat CXR. Today she reports persistent cough, and sorethroat. Resolved chest tightness and intermittent wheezing. No fever, no purulent mucus, no GERD symptoms Unable to tolerate any OVER THE COUNTER antihistamine. Previous use of singulair with no adverse effects. Current use of air purifers with hepa filters at home  Start singulair 10mg  at hs Maintain current inhaler and nebulizer treatment F/up with pcp in 10month

## 2024-07-08 ENCOUNTER — Encounter: Payer: Self-pay | Admitting: Nurse Practitioner

## 2024-07-08 NOTE — Telephone Encounter (Signed)
 Patient seen Nche on Friday and pt states she had a allergic reaction singular prescribed. Patient states she used a epi pen and chilled out. Patient states it not much she feels we can do. She will follow up with her asthma and allergy doctor. Patient wants DR. Rudd to please read message sent from Patient to [place].

## 2024-07-09 NOTE — Telephone Encounter (Signed)
 Reviewed note as requested.

## 2024-07-11 ENCOUNTER — Other Ambulatory Visit: Payer: Self-pay

## 2024-07-11 ENCOUNTER — Other Ambulatory Visit (HOSPITAL_BASED_OUTPATIENT_CLINIC_OR_DEPARTMENT_OTHER): Payer: Self-pay

## 2024-07-11 MED ORDER — INTRAROSA 6.5 MG VA INST
1.0000 | VAGINAL_INSERT | VAGINAL | 0 refills | Status: AC
  Filled 2024-07-11: qty 28, 84d supply, fill #0

## 2024-07-17 ENCOUNTER — Other Ambulatory Visit: Payer: Self-pay

## 2024-07-17 ENCOUNTER — Other Ambulatory Visit: Payer: Self-pay | Admitting: Family

## 2024-07-17 DIAGNOSIS — F908 Attention-deficit hyperactivity disorder, other type: Secondary | ICD-10-CM

## 2024-07-18 ENCOUNTER — Other Ambulatory Visit: Payer: Self-pay

## 2024-07-19 ENCOUNTER — Other Ambulatory Visit (HOSPITAL_BASED_OUTPATIENT_CLINIC_OR_DEPARTMENT_OTHER): Payer: Self-pay

## 2024-07-19 ENCOUNTER — Other Ambulatory Visit: Payer: Self-pay | Admitting: Family Medicine

## 2024-07-19 DIAGNOSIS — F908 Attention-deficit hyperactivity disorder, other type: Secondary | ICD-10-CM

## 2024-07-19 MED ORDER — METHYLPHENIDATE HCL 10 MG PO TABS
10.0000 mg | ORAL_TABLET | Freq: Three times a day (TID) | ORAL | 0 refills | Status: DC
  Filled 2024-07-19: qty 90, 30d supply, fill #0

## 2024-07-30 ENCOUNTER — Other Ambulatory Visit (HOSPITAL_BASED_OUTPATIENT_CLINIC_OR_DEPARTMENT_OTHER): Payer: Self-pay

## 2024-07-30 ENCOUNTER — Other Ambulatory Visit: Payer: Self-pay

## 2024-07-30 ENCOUNTER — Other Ambulatory Visit: Payer: Self-pay | Admitting: Family Medicine

## 2024-07-30 DIAGNOSIS — F4001 Agoraphobia with panic disorder: Secondary | ICD-10-CM

## 2024-07-30 DIAGNOSIS — F411 Generalized anxiety disorder: Secondary | ICD-10-CM

## 2024-07-30 MED ORDER — ALPRAZOLAM 1 MG PO TABS
1.0000 mg | ORAL_TABLET | Freq: Two times a day (BID) | ORAL | 2 refills | Status: DC | PRN
  Filled 2024-07-30: qty 20, 10d supply, fill #0
  Filled 2024-08-12: qty 20, 10d supply, fill #1
  Filled 2024-08-27: qty 20, 10d supply, fill #2

## 2024-07-30 MED ORDER — DIAZEPAM 10 MG PO TABS
10.0000 mg | ORAL_TABLET | Freq: Four times a day (QID) | ORAL | 2 refills | Status: DC | PRN
  Filled 2024-07-30: qty 30, 8d supply, fill #0
  Filled 2024-08-12: qty 30, 8d supply, fill #1
  Filled 2024-08-27: qty 30, 8d supply, fill #2

## 2024-08-12 ENCOUNTER — Other Ambulatory Visit: Payer: Self-pay | Admitting: Allergy and Immunology

## 2024-08-12 ENCOUNTER — Other Ambulatory Visit (HOSPITAL_BASED_OUTPATIENT_CLINIC_OR_DEPARTMENT_OTHER): Payer: Self-pay

## 2024-08-12 MED ORDER — AIRSUPRA 90-80 MCG/ACT IN AERO
2.0000 | INHALATION_SPRAY | Freq: Four times a day (QID) | RESPIRATORY_TRACT | 2 refills | Status: AC | PRN
  Filled 2024-08-12: qty 10.7, 15d supply, fill #0
  Filled 2024-08-27: qty 10.7, 15d supply, fill #1

## 2024-08-13 ENCOUNTER — Other Ambulatory Visit (HOSPITAL_BASED_OUTPATIENT_CLINIC_OR_DEPARTMENT_OTHER): Payer: Self-pay

## 2024-08-13 ENCOUNTER — Telehealth: Payer: Self-pay | Admitting: Allergy and Immunology

## 2024-08-13 NOTE — Telephone Encounter (Signed)
 Patient would like a refill of budesonide  .25mg  nebulizer solution and sent to her pharmacy and to please give her a call.

## 2024-08-13 NOTE — Telephone Encounter (Signed)
 Is it okay to refill the nebulizer medication for the patient? I didn't see mention in your last office note and just wanted to be sure.

## 2024-08-15 ENCOUNTER — Other Ambulatory Visit (HOSPITAL_BASED_OUTPATIENT_CLINIC_OR_DEPARTMENT_OTHER): Payer: Self-pay

## 2024-08-15 ENCOUNTER — Other Ambulatory Visit: Payer: Self-pay | Admitting: *Deleted

## 2024-08-15 MED ORDER — BUDESONIDE 0.25 MG/2ML IN SUSP
0.2500 mg | Freq: Two times a day (BID) | RESPIRATORY_TRACT | 1 refills | Status: DC | PRN
  Filled 2024-08-15: qty 120, 30d supply, fill #0

## 2024-08-15 NOTE — Telephone Encounter (Signed)
 Medication refill has been sent in to pharmacy. Called and left a voicemail asking for the patient to return call to inform.

## 2024-08-16 ENCOUNTER — Other Ambulatory Visit (HOSPITAL_BASED_OUTPATIENT_CLINIC_OR_DEPARTMENT_OTHER): Payer: Self-pay

## 2024-08-27 ENCOUNTER — Other Ambulatory Visit: Payer: Self-pay

## 2024-08-27 ENCOUNTER — Other Ambulatory Visit: Payer: Self-pay | Admitting: Allergy and Immunology

## 2024-08-27 ENCOUNTER — Other Ambulatory Visit: Payer: Self-pay | Admitting: Family Medicine

## 2024-08-27 DIAGNOSIS — F908 Attention-deficit hyperactivity disorder, other type: Secondary | ICD-10-CM

## 2024-08-27 NOTE — Progress Notes (Signed)
 [facility] PRIMARY CARE [facility] [address] [place] [address] Dept: [phone] Dept Fax: [phone]  Chronic Care Office Visit  Subjective:    Patient ID: Samantha [name], female    DOB: 11/28/1970, 53 y.o..   MRN: [id number]  No chief complaint on file.  History of Present Illness:  Patient is in today for reassessment of chronic medical conditions.   Samantha [name] has a history of hypertension and is managed on atenolol  25 mg 1/2 tab daily. She notes her pressure was up this morning. She is unsure if this relates to her headache onset or not.    Samantha [name] has a history of hyperlipidemia. She is managed on rosuvastatin  10 mg daily.   Samantha [name] has a history of asthma and allergy symptoms. She is followed by Dr. Bennetta (allergist).  She uses AirSupra  q 4 hours as needed. Due to a recent exacerbation, I prescribed a course of Prednisone .  Past Medical History: Patient Active Problem List   Diagnosis Date Noted   Moderate persistent asthma with acute exacerbation 07/05/2024   COVID-19 long hauler 06/26/2023   Gross hematuria 04/27/2023   Renal mass 04/24/2023   Cervical spine arthritis 02/25/2022   Leg swelling 10/19/2021   Interstitial cystitis 09/15/2021   Other urethral stricture, female 09/15/2021   Chronic kidney disease, stage 3a (HCC) 08/20/2021   Prediabetes 08/27/2020   History of [place] spotted fever 02/04/2020   LPRD (laryngopharyngeal reflux disease) 08/02/2019   Chronic rhinitis 08/02/2019   Chronic post-traumatic stress disorder (PTSD) 11/21/2018   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) 11/21/2018   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/2018   Pulmonary nodules 11/13/2018   Other microscopic hematuria 11/06/2018   Generalized anxiety disorder 07/04/2018   Fecal incontinence 07/02/2018   Leg weakness 06/12/2017   Left ear hearing loss 02/07/2017   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   Borderline hyperlipidemia 11/08/2016   Acute idiopathic gout of multiple sites 11/08/2016   Elbow pain, chronic, right 10/06/2016   Lateral epicondylitis, right elbow 10/06/2016   Fibromyalgia 10/04/2016   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   Migraine headache 07/29/2016   Irritable bowel syndrome with both constipation and diarrhea 07/29/2016   Hives 07/18/2016   Urinary incontinence 06/02/2016   Atherosclerosis of abdominal aorta 09/03/2015   Carpal tunnel syndrome 11/19/2014   Primary hypersomnia 11/18/2014   Insomnia 11/18/2014   Lumbar radicular pain 11/18/2014   Chronic pain syndrome 11/18/2014   Cervical disc disorder with radiculopathy 11/18/2014   Chronic female pelvic pain 07/31/2014   Degeneration of intervertebral disc of lumbosacral region 07/31/2014   Rosacea 07/31/2014   Morton's neuroma 07/31/2014   Benign paroxysmal positional vertigo 07/31/2014   Essential hypertension 07/31/2014   History of methicillin resistant Staphylococcus aureus infection 07/09/2014   Past Surgical History:  Procedure Laterality Date   BLADDER SURGERY     age 51- 34   CERVICAL FUSION     08/1998, 03/10/2017   CESAREAN SECTION     COLONOSCOPY     COMBINED HYSTEROSCOPY DIAGNOSTIC / D&C     CYSTO WITH HYDRODISTENSION N/A 02/22/2023   Procedure: HYDRODISTENSION;  Surgeon: Lovie Arlyss CROME, MD;  Location: [facility];  Service: Urology;  Laterality: N/A;   CYSTOSCOPY W/ RETROGRADES N/A 02/22/2023   Procedure: CYSTOSCOPY WITH BILATERAL RETROGRADE PYELOGRAM;  Surgeon: Lovie Arlyss CROME, MD;  Location: [facility];  Service: Urology;  Laterality: N/A;  60 MINUTES NEEDED FOR CASE   CYSTOSCOPY WITH URETHRAL DILATATION  N/A 02/22/2023   Procedure: URETHRAL DILATATION;  Surgeon: Lovie Arlyss CROME, MD;  Location: [facility];  Service: Urology;  Laterality: N/A;   HAND SURGERY     2012, 2013   LAPAROSCOPIC ENDOMETRIOSIS FULGURATION Right    LESION REMOVAL  08/23/2012    Procedure: LESION REMOVAL HAND;  Surgeon: Lamar LULLA Leonor Mickey., MD;  Location: [facility];  Service: Orthopedics;  Laterality: Left;   ROTATOR CUFF REPAIR     SPINE SURGERY  [phone]   UPPER GASTROINTESTINAL ENDOSCOPY     Family History  Problem Relation Age of Onset   Asthma Mother    Clotting disorder Mother    Heart attack Mother    Hypertension Mother    Cancer Father        Multiple myeloma   Anemia Father    Arrhythmia Father    Heart attack Father    Heart disease Father    Hyperlipidemia Father    Hypertension Father    ADD / ADHD Brother    Arthritis Brother    Learning disabilities Brother    Stomach cancer Maternal Grandfather    Asthma Maternal Grandfather    Heart attack Maternal Grandfather    Cancer Maternal Grandmother    Kidney disease Maternal Grandmother    Heart attack Paternal Grandfather    Diabetes Paternal Grandmother    ADD / ADHD Son    Anxiety disorder Son    Hearing loss Son    Learning disabilities Son    Cancer Paternal Uncle    COPD Paternal Uncle    Colon cancer Neg Hx    Esophageal cancer Neg Hx    Rectal cancer Neg Hx    Outpatient Medications Prior to Visit  Medication Sig Dispense Refill   AIRSUPRA  90-80 MCG/ACT AERO Inhale 2 puffs into the lungs every 6 (six) hours as needed. 10.7 g 2   albuterol  (VENTOLIN  HFA) 108 (90 Base) MCG/ACT inhaler INHALE 2 PUFFS BY MOUTH EVERY 4 HOURS AS NEEDED FOR WHEEZING FOR SHORTNESS OF BREATH 18 g 0   ALPRAZolam  (XANAX ) 1 MG tablet Take 1 tablet (1 mg total) by mouth 2 (two) times daily as needed. 20 tablet 2   Alum Hydroxide-Mag Trisilicate (GAVISCON) 80-14.2 MG CHEW Chew 2 tablets by mouth as needed (acid reflux).     aspirin EC 81 MG tablet Take 81 mg by mouth daily. Swallow whole.     atenolol  (TENORMIN ) 25 MG tablet Take 0.5 tablets (12.5 mg total) by mouth daily. 90 tablet 1   AUVI-Q  0.3 MG/0.3ML SOAJ injection Inject 0.3 mg into the muscle as needed for anaphylaxis. As directed  for life-threatening allergic reactions 2 each 1   azelastine  (ASTELIN ) 0.1 % nasal spray Place 2 sprays into each nostril 1-2 times per day. 30 mL 5   benzonatate  (TESSALON ) 100 MG capsule Take 1 capsule (100 mg total) by mouth 3 (three) times daily as needed for cough. 30 capsule 0   budesonide  (PULMICORT ) 0.25 MG/2ML nebulizer solution Take 2 mLs (0.25 mg total) by nebulization 2 (two) times daily as needed (Asthma). 120 mL 1   Cholecalciferol (VITAMIN D ) 50 MCG (2000 UT) CAPS Take 2,000 Units by mouth daily.     cimetidine (TAGAMET) 200 MG tablet Take 200 mg by mouth 2 (two) times daily.     diazepam  (VALIUM ) 10 MG tablet Take 1 tablet (10 mg total) by mouth every 6 (six) hours as needed for anxiety or sleep (vertigo/muscle spasms). 30 tablet 2  EPINEPHrine  (PRIMATENE  MIST) 0.125 MG/ACT AERO Inhale 1 puff into the lungs as needed (Asthma).     estradiol  (ESTRACE ) 0.1 MG/GM vaginal cream Place 1 gram vaginally 2 (two) times a week at bedtime for 90 days. 42.5 g 3   famotidine (PEPCID AC MAXIMUM STRENGTH) 20 MG tablet Take 20 mg by mouth daily as needed (Hives/itching).     guaifenesin (HUMIBID E) 400 MG TABS tablet Take 400-800 mg by mouth every 4 (four) hours as needed (Cough/mucus).     ipratropium (ATROVENT ) 0.02 % nebulizer solution Take 2.5 mLs (0.5 mg total) by nebulization 4 (four) times daily. 75 mL 12   ipratropium (ATROVENT ) 0.06 % nasal spray Place 2 sprays into each nostril 1-2 times per day. 15 mL 5   levalbuterol  (XOPENEX  HFA) 45 MCG/ACT inhaler Inhale 2 puffs into the lungs every 6 (six) hours as needed for wheezing. 15 g 1   magnesium oxide (MAG-OX) 400 (240 Mg) MG tablet Take 400 mg by mouth daily.     methylphenidate  (RITALIN ) 10 MG tablet Take 1 tablet (10 mg total) by mouth 3 (three) times daily with meals. 90 tablet 0   mirabegron  ER (MYRBETRIQ ) 25 MG TB24 tablet Take 1 tablet (25 mg total) by mouth daily. 30 tablet 11   montelukast  (SINGULAIR ) 10 MG tablet Take 1 tablet (10  mg total) by mouth at bedtime. 30 tablet 5   Olopatadine HCl (PATADAY) 0.2 % SOLN Place 1 drop into both eyes 2 (two) times daily as needed (eye allergies).     Prasterone  (INTRAROSA ) 6.5 MG INST Place 1 Insert vaginally 2 (two) times a week. 28 each 0   rosuvastatin  (CRESTOR ) 10 MG tablet Take 1 tablet (10 mg total) by mouth daily. 90 tablet 3   SUMAtriptan  (IMITREX ) 50 MG tablet Take 1 tablet (50 mg total) by mouth once as needed. May repeat in 2 hours if headache persists or recurs. 10 tablet 3   No facility-administered medications prior to visit.   Allergies[1]   Objective:   There were no vitals filed for this visit. There is no height or weight on file to calculate BMI.   General: Well developed, well nourished. No acute distress. HEENT: Normocephalic, non-traumatic. PERRL, EOMI. Conjunctiva clear. External ears normal. EAC and TMs normal bilaterally. Nose    clear without congestion or rhinorrhea. Mucous membranes moist. Oropharynx clear. Good dentition. Neck: Supple. No lymphadenopathy. No thyromegaly. Lungs: Clear to auscultation bilaterally. No wheezing, rales or rhonchi. CV: RRR without murmurs or rubs. Pulses 2+ bilaterally. Abdomen: Soft, non-tender. Bowel sounds positive, normal pitch and frequency. No hepatosplenomegaly. No rebound or guarding. Back: Straight. No CVA tenderness bilaterally. Extremities: Full ROM. No joint swelling or tenderness. No edema noted. Skin: Warm and dry. No rashes. Neuro: CN II-XII intact. Normal sensation and DTR bilaterally. Psych: Alert and oriented. Normal mood and affect.  Health Maintenance Due  Topic Date Due   HIV Screening  Never done   COVID-19 Vaccine (6 - 2025-26 season) 05/13/2024    Imaging DG Chest 2 View Result Date: 07/02/2024 IMPRESSION: 1. No acute cardiopulmonary process.   DG Chest 2 View Result Date: 07/01/2024 IMPRESSION: Hazy opacity projecting over the right middle lobe and lingula, which may represent  atelectasis or pneumonia.   Lab Results {Labs (Optional):29002}    Assessment & Plan:   Problem List Items Addressed This Visit   None   No follow-ups on file.   Garnette CHRISTELLA Simpler, MD  I,Emily Lagle,acting as a scribe for Garnette  CHRISTELLA Simpler, MD.,have documented all relevant documentation on the behalf of Garnette CHRISTELLA Simpler, MD.  I, Garnette CHRISTELLA Simpler, MD, have reviewed all documentation for this visit. The documentation on 08/28/2024 for the exam, diagnosis, procedures, and orders are all accurate and complete.    [1]  Allergies Allergen Reactions   Acetaminophen  Rash    Blistering lymph nodes, and blistering rash    Airduo Digihaler  [Fluticasone -Salmeterol] Hives, Shortness Of Breath and Itching    Asthma attack   Allegra  [Fexofenadine ] Cough    She reports tachycardia and elevated BP and low Oxygen level after taking this   Aspirin Shortness Of Breath    High dose brings asthma attack    B-12 [Cyanocobalamin ] Shortness Of Breath and Itching    Drop O2   Baclofen  Other (See Comments)    Headache, muscle weakness, unable to urinate, unable to walk    Bactrim Anaphylaxis   Bee Venom Hives, Itching, Other (See Comments) and Rash   Benadryl  [Diphenhydramine ] Anaphylaxis    Only the liquid form she can not take because it causes throat closing,  DO NOT USE IN IV FORM - tablets are ok   Ciprofloxacin Shortness Of Breath    Blurred Vision, joint pain, headaches, dropped BP   Clindamycin Shortness Of Breath   Coconut (Cocos Nucifera) Hives, Itching and Other (See Comments)    Coconut Oil    Coq-10 [Coenzyme Q10] Hives, Shortness Of Breath and Itching   Cymbalta [Duloxetine Hcl] Anaphylaxis and Shortness Of Breath   Darvon [Propoxyphene] Hives, Shortness Of Breath and Itching    Darvocet*   Estradiol  Rash and Nausea Only    Lethargy, abdominal and joint pain, nausea, and rash with Estradiol  vaginal cream   Flunisolide  Shortness Of Breath and Other (See Comments)    Aerospan  Nasal  Inhaler* - caused blood clots in nose    Fluticasone  Furoate Anaphylaxis, Cough, Nausea And Vomiting, Palpitations and Shortness Of Breath   Formaldehyde Anaphylaxis   Hydrocodone-Acetaminophen  Hives, Shortness Of Breath and Itching    Vicodin, Norco   Nucynta [Tapentadol] Shortness Of Breath    Coughing, SOB    Other Hives, Itching, Rash and Other (See Comments)    Insect stings/ Bites  Wine spirit  Peanuts - Bring on Asthma attack   Cold Temperatures  - causes hives, and muscle spasms    Peanut-Containing Drug Products Hives, Shortness Of Breath, Itching, Swelling and Other (See Comments)   Potassium-Containing Compounds Anaphylaxis, Itching, Swelling and Palpitations    Tables, and Oral Solution    Prednisone  Other (See Comments)    Per pt, had severe allergic reaction and was forced to go to ED   Ramipril Anaphylaxis   Skelaxin  [Metaxalone ] Hives, Shortness Of Breath and Itching    Brings on Asthma Attacks    Sulfa Antibiotics Anaphylaxis   Sulfamethoxazole-Trimethoprim Anaphylaxis   Tezspire  [Tezepelumab ] Shortness Of Breath, Nausea And Vomiting, Swelling, Rash and Other (See Comments)    Muscle weakness, joint pain, sore throat   Triamcinolone  Other (See Comments)    Kenalog  Injection - muscle weakness, spasms, BP drop, lethargic, tongue swelling   Nasacort  causes breathing issues    Voltaren [Diclofenac] Shortness Of Breath, Swelling and Rash    Gel*    Zofran  [Ondansetron  Hcl] Hives and Itching    Blurred Vision    Aspartame Other (See Comments)    Severe Migraines   Bacitra-Neomycin-Polymyxin-Hc Rash   Cranberry Extract Other (See Comments)    Throat itching / burning    Sulfur  Hives and Itching   Aciphex  [Rabeprazole ]     Muscle weakness, headaches    Addyi [Flibanserin]     Decreased sex drive to 0   Alvesco  [Ciclesonide ] Other (See Comments)    Sores in mouth, coughing    Ambien [Zolpidem]     Mood Change, Aggressive, Insomnia    Amoxicillin      Pt unsure  of Allergy    Ativan [Lorazepam] Other (See Comments)    Hallucination     Aygestin [Norethindrone]     Muscle weakness, abdominal pain   Celebrex [Celecoxib] Other (See Comments)    Possible Reaction - Not taken    Demeclocycline     Tetracycline Family-  GI Upset   Dexilant [Dexlansoprazole]     Severe joint pain    Doxycycline     Possibly joint pain or itch    Effexor Xr [Venlafaxine Hcl Er]     Migraines, weight gain, trouble sleeping   Elavil [Amitriptyline] Nausea And Vomiting    Headache, and muscle weakness   Erythromycin     Upset stomach   Erythromycin Base     Stomach Upset    Fentanyl  Dermatitis    Patches*    Fioricet [Butalbital-Apap-Caffeine]     Acetaminophen     Hydrocodone Itching   Hydrocodone Bit-Homatrop Mbr Hives and Itching    Not true allergy. irritated   Hydromorphone  Nausea Only   Inderal [Propranolol]     Agitation, difficulty sleeping    Indomethacin Hives and Itching   Latex Itching    Skin Peeling    Levofloxacin     Weakness, myalgia   Lyrica [Pregabalin]     Muscle weakness, Causes Falls    Maxalt [Rizatriptan] Other (See Comments)    Pt was knocked out from med, extreme drowsiness and did not help migraine    Meloxicam Itching    Increases BP   Morphine  Sulfate Er     Severe insomnia, does not help pain    Naproxen Hives and Itching    Head to Toe    Neurontin [Gabapentin]     Depression    Nexium [Esomeprazole]     Bad headaches    Nitrofuran Derivatives    Nortriptyline     Depression, decreased sex drive    Omeprazole-Sodium Bicarbonate     Chest pain-sore all over-HA   Ranitidine     Severe joint pain   Robaxin  [Methocarbamol ]     Made her very tired and unable to sleep at night Muscle weakness, fatigue    Tape Dermatitis   Tetanus Toxoid Other (See Comments)    Tetanus injection - blistering rash, swelling at injection site, turn into baseball size lump, joint pain    Tetanus-Diphtheria Toxoids Td Hives     Robust local reaction   Tetracyclines & Related     GI Upset    Tizanidine Other (See Comments)    knocked her out- had a rebound migraine, causes muscles to lock, and bedridden    Topamax [Topiramate]     Lethargic, daytime drowsiness, insomnia    Trazodone     Pt thinks it did not help - unsure of reaction    Tums [Calcium  Carbonate] Other (See Comments)    Blistering Rash with high doses    Wound Dressing Adhesive Itching and Dermatitis    Bandages    Etodolac  Rash    Blistering Rash, and swollen lymph nodes    Flexeril  [Cyclobenzaprine ] Palpitations    Irregular heartbeat  Ibuprofen Itching, Hives, Rash and Swelling    Throat itching   Neosporin [Neomycin-Bacitracin Zn-Polymyx] Rash    Blistering

## 2024-08-27 NOTE — Assessment & Plan Note (Signed)
 Stable. Continue focus on blood pressure control, adequate hydration, and avoidance of nephrotoxic medications.

## 2024-08-27 NOTE — Assessment & Plan Note (Signed)
 LDL cholesterol is at goal. Continue rosuvastatin 10 mg daily.

## 2024-08-27 NOTE — Assessment & Plan Note (Signed)
 Samantha [name] blood pressure is in good control. Continue atenolol  25 mg 1/2 tab daily.

## 2024-08-28 ENCOUNTER — Other Ambulatory Visit (HOSPITAL_BASED_OUTPATIENT_CLINIC_OR_DEPARTMENT_OTHER): Payer: Self-pay

## 2024-08-28 ENCOUNTER — Ambulatory Visit: Admitting: Family Medicine

## 2024-08-28 ENCOUNTER — Other Ambulatory Visit: Payer: Self-pay

## 2024-08-28 ENCOUNTER — Encounter: Payer: Self-pay | Admitting: Family Medicine

## 2024-08-28 VITALS — BP 120/76 | HR 62 | Temp 97.7°F | Ht 62.0 in | Wt 161.0 lb

## 2024-08-28 DIAGNOSIS — E785 Hyperlipidemia, unspecified: Secondary | ICD-10-CM | POA: Diagnosis not present

## 2024-08-28 DIAGNOSIS — G4700 Insomnia, unspecified: Secondary | ICD-10-CM | POA: Diagnosis not present

## 2024-08-28 DIAGNOSIS — N1831 Chronic kidney disease, stage 3a: Secondary | ICD-10-CM | POA: Diagnosis not present

## 2024-08-28 DIAGNOSIS — I10 Essential (primary) hypertension: Secondary | ICD-10-CM

## 2024-08-28 DIAGNOSIS — J4541 Moderate persistent asthma with (acute) exacerbation: Secondary | ICD-10-CM

## 2024-08-28 MED ORDER — AUVI-Q 0.3 MG/0.3ML IJ SOAJ
0.3000 mg | INTRAMUSCULAR | 1 refills | Status: DC | PRN
  Filled 2024-08-28: qty 2, 30d supply, fill #0

## 2024-08-28 MED ORDER — METHYLPHENIDATE HCL 10 MG PO TABS
10.0000 mg | ORAL_TABLET | Freq: Three times a day (TID) | ORAL | 0 refills | Status: DC
  Filled 2024-08-28: qty 90, 30d supply, fill #0

## 2024-08-28 NOTE — Assessment & Plan Note (Signed)
 Stable. Continue use of budesonide  nebs, AirSupra  and PRN albuterol  as managed by Dr. Kazlow.

## 2024-08-28 NOTE — Telephone Encounter (Signed)
 Appointment today. Dm/cma

## 2024-08-28 NOTE — Assessment & Plan Note (Signed)
 Current issues are likely reactive to recent deaths in family and friends. Samantha [name] is currently on diazepam . I do not recommend adding additional psychoactive drugs. I offered referral for counseling, but she declined.

## 2024-08-29 ENCOUNTER — Other Ambulatory Visit (HOSPITAL_BASED_OUTPATIENT_CLINIC_OR_DEPARTMENT_OTHER): Payer: Self-pay

## 2024-09-06 ENCOUNTER — Other Ambulatory Visit: Payer: Self-pay | Admitting: Family Medicine

## 2024-09-06 ENCOUNTER — Emergency Department (HOSPITAL_BASED_OUTPATIENT_CLINIC_OR_DEPARTMENT_OTHER)
Admission: EM | Admit: 2024-09-06 | Discharge: 2024-09-06 | Disposition: A | Discharge status: Home / Self Care | Attending: Emergency Medicine | Admitting: Emergency Medicine

## 2024-09-06 ENCOUNTER — Encounter (HOSPITAL_BASED_OUTPATIENT_CLINIC_OR_DEPARTMENT_OTHER): Payer: Self-pay

## 2024-09-06 ENCOUNTER — Other Ambulatory Visit: Payer: Self-pay

## 2024-09-06 ENCOUNTER — Emergency Department (HOSPITAL_BASED_OUTPATIENT_CLINIC_OR_DEPARTMENT_OTHER)

## 2024-09-06 DIAGNOSIS — M79621 Pain in right upper arm: Secondary | ICD-10-CM | POA: Insufficient documentation

## 2024-09-06 DIAGNOSIS — M79631 Pain in right forearm: Secondary | ICD-10-CM | POA: Diagnosis not present

## 2024-09-06 DIAGNOSIS — F411 Generalized anxiety disorder: Secondary | ICD-10-CM

## 2024-09-06 DIAGNOSIS — W0110XA Fall on same level from slipping, tripping and stumbling with subsequent striking against unspecified object, initial encounter: Secondary | ICD-10-CM | POA: Insufficient documentation

## 2024-09-06 DIAGNOSIS — Z91010 Allergy to peanuts: Secondary | ICD-10-CM | POA: Diagnosis not present

## 2024-09-06 DIAGNOSIS — M25521 Pain in right elbow: Secondary | ICD-10-CM | POA: Insufficient documentation

## 2024-09-06 DIAGNOSIS — M25531 Pain in right wrist: Secondary | ICD-10-CM | POA: Insufficient documentation

## 2024-09-06 DIAGNOSIS — F4001 Agoraphobia with panic disorder: Secondary | ICD-10-CM

## 2024-09-06 DIAGNOSIS — S86811A Strain of other muscle(s) and tendon(s) at lower leg level, right leg, initial encounter: Secondary | ICD-10-CM | POA: Diagnosis not present

## 2024-09-06 DIAGNOSIS — M25511 Pain in right shoulder: Secondary | ICD-10-CM | POA: Insufficient documentation

## 2024-09-06 DIAGNOSIS — Z7982 Long term (current) use of aspirin: Secondary | ICD-10-CM | POA: Insufficient documentation

## 2024-09-06 DIAGNOSIS — Z91040 Latex allergy status: Secondary | ICD-10-CM | POA: Diagnosis not present

## 2024-09-06 DIAGNOSIS — M25561 Pain in right knee: Secondary | ICD-10-CM | POA: Diagnosis present

## 2024-09-06 DIAGNOSIS — W19XXXA Unspecified fall, initial encounter: Secondary | ICD-10-CM

## 2024-09-06 DIAGNOSIS — T148XXA Other injury of unspecified body region, initial encounter: Secondary | ICD-10-CM

## 2024-09-06 MED ORDER — OXYCODONE HCL 5 MG PO TABS
5.0000 mg | ORAL_TABLET | Freq: Once | ORAL | Status: AC
  Administered 2024-09-06: 5 mg via ORAL
  Filled 2024-09-06: qty 1

## 2024-09-06 NOTE — ED Triage Notes (Signed)
 Patient reports ground level fall and struck her right side, denies LOC, denies striking her head. Patient reports right knee pain and discoloration, right hip pain, right wrist, right elbow, and right shoulder pain. Patient reports taking 81mg  daily.

## 2024-09-06 NOTE — ED Provider Notes (Signed)
 " [place] [facility] Provider Note   CSN: [id number] Arrival date & time: 09/06/24  1523     Patient presents with: Fall (Ground level fall)   Samantha [name] is a 53 y.o. female.  Presents to the ED after a fall.  She reports falling 1 step after slipping and striking her right side of body.  Denies LOC.  Denies hitting her head.  She reports right knee, right shoulder, right hip, right forearm, right wrist and right elbow pain after the incident.  Patient only takes a baby aspirin.  No blood thinners.  she has multiple allergies and cannot take multiple medications including Tylenol  and ibuprofen.  Patient has a history of fibromyalgia. she denies any abrasions.  She denies any nausea, vomiting, confusion after the incident. Patient denies any fevers, chills, cough, shortness of breath, back pain.  She was able to ambulate after the incident.       Fall       Prior to Admission medications  Medication Sig Start Date End Date Taking? Authorizing Provider  AIRSUPRA  90-80 MCG/ACT AERO Inhale 2 puffs into the lungs every 6 (six) hours as needed. 08/12/24   Kozlow, Camellia PARAS, MD  albuterol  (VENTOLIN  HFA) 108 [phone] Base) MCG/ACT inhaler INHALE 2 PUFFS BY MOUTH EVERY 4 HOURS AS NEEDED FOR WHEEZING FOR SHORTNESS OF BREATH 09/16/22   Kozlow, Camellia PARAS, MD  ALPRAZolam  (XANAX ) 1 MG tablet Take 1 tablet (1 mg total) by mouth 2 (two) times daily as needed. 07/30/24   Thedora Garnette HERO, MD  Alum Hydroxide-Mag Trisilicate (GAVISCON) 80-14.2 MG CHEW Chew 2 tablets by mouth as needed (acid reflux).    [provider]  aspirin EC 81 MG tablet Take 81 mg by mouth daily. Swallow whole.    [provider]  atenolol  (TENORMIN ) 25 MG tablet Take 0.5 tablets (12.5 mg total) by mouth daily. 03/12/24   Thedora Garnette HERO, MD  AUVI-Q  0.3 MG/0.3ML SOAJ injection Inject 0.3 mg into the muscle as needed for anaphylaxis. Use as directed for life-threatening allergic  reactions. 08/28/24   Kozlow, Camellia PARAS, MD  azelastine  (ASTELIN ) 0.1 % nasal spray Place 2 sprays into each nostril 1-2 times per day. 10/02/23   Kozlow, Camellia PARAS, MD  benzonatate  (TESSALON ) 100 MG capsule Take 1 capsule (100 mg total) by mouth 3 (three) times daily as needed for cough. 07/01/24   Saguier, [place], PA-C  budesonide  (PULMICORT ) 0.25 MG/2ML nebulizer solution Take 2 mLs (0.25 mg total) by nebulization 2 (two) times daily as needed (Asthma). 08/15/24   Kozlow, Camellia PARAS, MD  Cholecalciferol (VITAMIN D ) 50 MCG (2000 UT) CAPS Take 2,000 Units by mouth daily.    [provider]  cimetidine (TAGAMET) 200 MG tablet Take 200 mg by mouth 2 (two) times daily.    [provider]  diazepam  (VALIUM ) 10 MG tablet Take 1 tablet (10 mg total) by mouth every 6 (six) hours as needed for anxiety or sleep (vertigo/muscle spasms). 07/30/24   Thedora Garnette HERO, MD  EPINEPHrine  (PRIMATENE  MIST) 0.125 MG/ACT AERO Inhale 1 puff into the lungs as needed (Asthma).    [provider]  famotidine (PEPCID AC MAXIMUM STRENGTH) 20 MG tablet Take 20 mg by mouth daily as needed (Hives/itching). 09/16/22   [provider]  guaifenesin (HUMIBID E) 400 MG TABS tablet Take 400-800 mg by mouth every 4 (four) hours as needed (Cough/mucus).    [provider]  ipratropium (ATROVENT ) 0.02 % nebulizer  solution Take 2.5 mLs (0.5 mg total) by nebulization 4 (four) times daily. 07/01/24   Saguier, [place], PA-C  ipratropium (ATROVENT ) 0.06 % nasal spray Place 2 sprays into each nostril 1-2 times per day. 04/26/24   Kozlow, Camellia PARAS, MD  levalbuterol  (XOPENEX  HFA) 45 MCG/ACT inhaler Inhale 2 puffs into the lungs every 6 (six) hours as needed for wheezing. 07/01/24   Saguier, [place], PA-C  magnesium oxide (MAG-OX) 400 (240 Mg) MG tablet Take 400 mg by mouth daily.    [provider]  methylphenidate  (RITALIN ) 10 MG tablet Take 1 tablet (10 mg total) by mouth 3 (three) times daily with meals.  08/28/24 09/27/24  Thedora Garnette HERO, MD  mirabegron  ER (MYRBETRIQ ) 25 MG TB24 tablet Take 1 tablet (25 mg total) by mouth daily. 06/06/24   Stoneking, Adine PARAS., MD  Olopatadine HCl (PATADAY) 0.2 % SOLN Place 1 drop into both eyes 2 (two) times daily as needed (eye allergies).    [provider]  Prasterone  (INTRAROSA ) 6.5 MG INST Place 1 Insert vaginally 2 (two) times a week. 07/11/24     rosuvastatin  (CRESTOR ) 10 MG tablet Take 1 tablet (10 mg total) by mouth daily. 06/28/24   Thedora Garnette HERO, MD  SUMAtriptan  (IMITREX ) 50 MG tablet Take 1 tablet (50 mg total) by mouth once as needed. May repeat in 2 hours if headache persists or recurs. 05/31/24   Webb, Padonda B, FNP    Allergies: Acetaminophen , Airduo digihaler  [fluticasone -salmeterol], Allegra  [fexofenadine ], Aspirin, B-12 [cyanocobalamin ], Baclofen , Bactrim, Bee venom, Benadryl  [diphenhydramine ], Ciprofloxacin, Clindamycin, Coconut (cocos nucifera), Coq-10 [coenzyme q10], Cymbalta [duloxetine hcl], Darvon [propoxyphene], Estradiol , Flunisolide , Fluticasone  furoate, Formaldehyde, Hydrocodone-acetaminophen , Nucynta [tapentadol], Other, Peanut-containing drug products, Potassium-containing compounds, Prednisone , Ramipril, Skelaxin  [metaxalone ], Sulfa antibiotics, Sulfamethoxazole-trimethoprim, Tezspire  [tezepelumab ], Triamcinolone , Voltaren [diclofenac], Zofran  [ondansetron  hcl], Aspartame, Bacitra-neomycin-polymyxin-hc, Cranberry extract, Sulfur, Aciphex  [rabeprazole ], Addyi [flibanserin], Alvesco  [ciclesonide ], Ambien [zolpidem], Amoxicillin , Ativan [lorazepam], Aygestin [norethindrone], Celebrex [celecoxib], Demeclocycline, Dexilant [dexlansoprazole], Doxycycline, Effexor xr [venlafaxine hcl er], Elavil [amitriptyline], Erythromycin, Erythromycin base, Fentanyl , Fioricet [butalbital-apap-caffeine], Hydrocodone, Hydrocodone bit-homatrop mbr, Hydromorphone , Inderal [propranolol], Indomethacin, Latex, Levofloxacin, Lyrica [pregabalin], Maxalt  [rizatriptan], Meloxicam, Morphine  sulfate er, Naproxen, Neurontin [gabapentin], Nexium [esomeprazole], Nitrofuran derivatives, Nortriptyline, Omeprazole-sodium bicarbonate, Ranitidine, Robaxin  [methocarbamol ], Tape, Tetanus toxoid, Tetanus-diphtheria toxoids td, Tetracyclines & related, Tizanidine, Topamax [topiramate], Trazodone, Tums [calcium  carbonate], Wound dressing adhesive, Etodolac , Flexeril  [cyclobenzaprine ], Ibuprofen, and Neosporin [neomycin-bacitracin zn-polymyx]    Review of Systems  Musculoskeletal:  Positive for arthralgias.    Updated Vital Signs BP 126/86   Pulse 68   Temp 97.6 F (36.4 C) (Oral)   Ht 5' 2 (1.575 m)   Wt 71.2 kg   LMP  (LMP Unknown)   SpO2 100%   BMI 28.72 kg/m   Physical Exam Vitals and nursing note reviewed.  Constitutional:      General: She is not in acute distress.    Appearance: Normal appearance. She is well-developed.  HENT:     Head: Normocephalic and atraumatic.  Eyes:     Conjunctiva/sclera: Conjunctivae normal.  Cardiovascular:     Rate and Rhythm: Normal rate and regular rhythm.     Heart sounds: No murmur heard. Pulmonary:     Effort: Pulmonary effort is normal. No respiratory distress.     Breath sounds: Normal breath sounds.  Abdominal:     Palpations: Abdomen is soft.     Tenderness: There is no abdominal tenderness.  Musculoskeletal:        General: No swelling.     Right shoulder: Tenderness present. No deformity. Normal  range of motion. Decreased strength.     Left shoulder: Normal.     Right upper arm: Tenderness present. No swelling, edema, deformity or lacerations.     Left upper arm: Normal.     Right elbow: No swelling, deformity, effusion or lacerations. Normal range of motion. Tenderness present in olecranon process.     Left elbow: Normal.     Right forearm: Tenderness present. No swelling, edema, deformity or bony tenderness.     Left forearm: Normal.     Right wrist: Tenderness present. No swelling,  deformity, effusion, lacerations, bony tenderness or snuff box tenderness. Normal range of motion.     Left wrist: Normal.     Cervical back: Normal and neck supple.     Thoracic back: Normal.     Lumbar back: Normal.     Right hip: Normal.     Left hip: Normal.     Right upper leg: Normal.     Left upper leg: Normal.     Right knee: No swelling, deformity, effusion, erythema, ecchymosis, lacerations or bony tenderness. Normal range of motion. Tenderness present over the MCL and patellar tendon.     Left knee: Normal.     Right lower leg: Normal.     Left lower leg: Normal.     Right ankle: Normal.     Left ankle: Normal.     Comments: Diffuse tenderness to right elbow, right shoulder, right knee.  Normal range of motion of the neck, elbow, ankle, knee.  No erythema or swelling present.  No abrasions or laceration present  Skin:    General: Skin is warm and dry.     Capillary Refill: Capillary refill takes less than 2 seconds.     Findings: No abrasion, ecchymosis, erythema, signs of injury, laceration, lesion or rash.  Neurological:     Mental Status: She is alert and oriented to person, place, and time.     Cranial Nerves: Cranial nerves 2-12 are intact.     Sensory: Sensation is intact.     Comments: Mental Status:  Alert, oriented, thought content appropriate, able to give a coherent history. Speech fluent without evidence of aphasia. Able to follow 2 step commands without difficulty.  Cranial Nerves:  II:  Peripheral visual fields grossly normal, pupils equal, round, reactive to light III,IV, VI: ptosis not present, extra-ocular motions intact bilaterally  V,VII: smile symmetric, facial light touch sensation equal VIII: hearing grossly normal to voice  X: uvula elevates symmetrically  XI: bilateral shoulder shrug symmetric and strong XII: midline tongue extension without fassiculations Motor:  Normal tone. 5/5 in upper and lower extremities bilaterally including strong and  equal grip strength and dorsiflexion/plantar flexion Sensory: Pinprick and light touch normal in all extremities.  Deep Tendon Reflexes: 2+ and symmetric in the biceps and patella Cerebellar: normal finger-to-nose with bilateral upper extremities Gait: normal gait and balance CV: distal pulses palpable throughout    Psychiatric:        Mood and Affect: Mood normal.     (all labs ordered are listed, but only abnormal results are displayed) Labs Reviewed - No data to display  EKG: None  Radiology: DG Shoulder Right Result Date: 09/06/2024 EXAM: 1 VIEW(S) XRAY OF THE _LATERALITY_ SHOULDER 09/06/2024 04:26:00 PM COMPARISON: None available. CLINICAL HISTORY: Ground-level fall FINDINGS: BONES AND JOINTS: Glenohumeral joint is normally aligned. No acute fracture. No malalignment. Mild glenohumeral and acromioclavicular joint osteoarthritis. SOFT TISSUES: No abnormal calcifications. Visualized lung is unremarkable. IMPRESSION: 1. No  acute fracture or dislocation. Electronically signed by: Morgane Naveau MD 09/06/2024 04:48 PM EST RP Workstation: [id number]   DG Knee Complete 4 Views Right Result Date: 09/06/2024 EXAM: 4 VIEW(S) XRAY OF THE KNEE 09/06/2024 04:26:00 PM COMPARISON: None available. CLINICAL HISTORY: Ground-level fall FINDINGS: BONES AND JOINTS: No acute fracture. No malalignment. No significant joint effusion. No significant degenerative changes. SOFT TISSUES: The soft tissues are unremarkable. IMPRESSION: 1. No evidence of acute traumatic injury. Electronically signed by: Morgane Naveau MD 09/06/2024 04:47 PM EST RP Workstation: [id number]   DG Hip Unilat  With Pelvis 2-3 Views Right Result Date: 09/06/2024 EXAM: 2 OR MORE VIEW(S) XRAY OF THE HIP 09/06/2024 04:26:00 PM COMPARISON: XR Hip 01/15/2009. CLINICAL HISTORY: fall FINDINGS: BONES AND JOINTS: No acute fracture. No malalignment. SOFT TISSUES: The soft tissues are unremarkable. IMPRESSION: 1. No evidence of acute traumatic  injury. Electronically signed by: Morgane Naveau MD 09/06/2024 04:46 PM EST RP Workstation: [id number]   DG Forearm Right Result Date: 09/06/2024 EXAM: VIEW(S) XRAY OF THE LATERALITY FOREARM 09/06/2024 04:26:00 PM COMPARISON: None available. CLINICAL HISTORY: fall FINDINGS: FINDINGS: BONES AND JOINTS: No acute fracture. No malalignment. SOFT TISSUES: The soft tissues are unremarkable. IMPRESSION: 1. No acute fracture or dislocation. Electronically signed by: Morgane Naveau MD 09/06/2024 04:44 PM EST RP Workstation: [id number]   DG Elbow Complete Right Result Date: 09/06/2024 EXAM: 3 VIEW(S) XRAY OF THE ELBOW COMPARISON: None available. CLINICAL HISTORY: fall FINDINGS: BONES AND JOINTS: No acute fracture. No malalignment. SOFT TISSUES: The soft tissues are unremarkable. IMPRESSION: 1. No acute fracture or dislocation. Electronically signed by: Morgane Naveau MD 09/06/2024 04:44 PM EST RP Workstation: [id number]   DG Wrist Complete Right Result Date: 09/06/2024 EXAM: 3 or more VIEW(S) XRAY OF THE WRIST 09/06/2024 04:26:00 PM COMPARISON: None available. CLINICAL HISTORY: fall FINDINGS: BONES AND JOINTS: No acute fracture. No malalignment. SOFT TISSUES: The soft tissues are unremarkable. IMPRESSION: 1. No evidence of acute traumatic injury. Electronically signed by: Morgane Naveau MD 09/06/2024 04:44 PM EST RP Workstation: [id number]     Procedures   Medications Ordered in the ED  oxyCODONE  (Oxy IR/ROXICODONE ) immediate release tablet 5 mg (5 mg Oral Given 09/06/24 1854)                                    Medical Decision Making Amount and/or Complexity of Data Reviewed Radiology: ordered.   Patient presents to the ED with right-sided shoulder, knee, hip, forearm, elbow pain after a fall.  She did have diffuse tenderness on the right side of her body.  Neurovascularly intact.  X-rays did not reveal any acute fractures at this time.  Vital signs stable.  Not ill-appearing or in distress.   Since the fall was low mechanism, and is able to move her extremities including her neck, will not obtain CT head or CT cervical spine.  Per [place] head CT and cervical spine rule, risk is very low.  Since the patient is allergic to multiple medications, I have only prescribed her 1 oxycodone  here.  Did not recommend ibuprofen or Tylenol  as she is allergic.  Educated her that this is most likely a strain and she will be sore for the next few days.  Recommended her to rest, apply heat or ice and stretch.  Patient is in agreement with plan and is stable for discharge.  Return precautions were provided.       Final diagnoses:  Fall, initial encounter  Muscle strain    ED Discharge Orders     None          Braxton Dubois, [place] 09/06/24 1927    Dreama Longs, MD 09/07/24 1205  "

## 2024-09-06 NOTE — Discharge Instructions (Addendum)
 Rest elevate, apply heat or ice as needed.  Stretch muscles as tolerated.

## 2024-09-09 ENCOUNTER — Other Ambulatory Visit (HOSPITAL_BASED_OUTPATIENT_CLINIC_OR_DEPARTMENT_OTHER): Payer: Self-pay

## 2024-09-09 MED ORDER — ALPRAZOLAM 1 MG PO TABS
1.0000 mg | ORAL_TABLET | Freq: Two times a day (BID) | ORAL | 2 refills | Status: AC | PRN
  Filled 2024-09-09: qty 20, 10d supply, fill #0
  Filled 2024-09-23: qty 20, 10d supply, fill #1
  Filled 2024-10-10: qty 20, 10d supply, fill #2

## 2024-09-09 MED ORDER — DIAZEPAM 10 MG PO TABS
10.0000 mg | ORAL_TABLET | Freq: Four times a day (QID) | ORAL | 2 refills | Status: AC | PRN
  Filled 2024-09-09: qty 30, 8d supply, fill #0
  Filled 2024-09-23: qty 30, 8d supply, fill #1
  Filled 2024-10-10: qty 30, 8d supply, fill #2

## 2024-09-09 NOTE — Telephone Encounter (Signed)
 Refill request for  Alprazolam   1 mg LR 07/30/24, #20, 2 rf  Diazepam  10 mg LR 11/18//25, #30, 2 rf LOV  08/28/24 FOV  10/29/24  Please review and advise.  Thanks.  Dm/cma

## 2024-09-11 ENCOUNTER — Ambulatory Visit: Admitting: Urology

## 2024-09-18 ENCOUNTER — Other Ambulatory Visit (HOSPITAL_BASED_OUTPATIENT_CLINIC_OR_DEPARTMENT_OTHER): Payer: Self-pay

## 2024-09-23 ENCOUNTER — Other Ambulatory Visit (HOSPITAL_BASED_OUTPATIENT_CLINIC_OR_DEPARTMENT_OTHER): Payer: Self-pay

## 2024-09-23 ENCOUNTER — Other Ambulatory Visit: Payer: Self-pay

## 2024-09-24 ENCOUNTER — Other Ambulatory Visit (HOSPITAL_BASED_OUTPATIENT_CLINIC_OR_DEPARTMENT_OTHER): Payer: Self-pay

## 2024-09-25 ENCOUNTER — Other Ambulatory Visit (HOSPITAL_BASED_OUTPATIENT_CLINIC_OR_DEPARTMENT_OTHER): Payer: Self-pay

## 2024-09-26 ENCOUNTER — Other Ambulatory Visit: Payer: Self-pay | Admitting: Family Medicine

## 2024-09-26 DIAGNOSIS — F908 Attention-deficit hyperactivity disorder, other type: Secondary | ICD-10-CM

## 2024-09-26 NOTE — Telephone Encounter (Signed)
 Refill request for  Methylphenidate  10 mg LR  08/28/24, #90, o rf LOV  08/28/24 FOV 10/29/24  Please review and advise. Thanks Dm/cma

## 2024-09-27 ENCOUNTER — Other Ambulatory Visit (HOSPITAL_BASED_OUTPATIENT_CLINIC_OR_DEPARTMENT_OTHER): Payer: Self-pay

## 2024-09-27 ENCOUNTER — Other Ambulatory Visit: Payer: Self-pay

## 2024-09-27 MED ORDER — METHYLPHENIDATE HCL 10 MG PO TABS
10.0000 mg | ORAL_TABLET | Freq: Three times a day (TID) | ORAL | 0 refills | Status: AC
  Filled 2024-09-27: qty 90, 30d supply, fill #0

## 2024-10-10 ENCOUNTER — Other Ambulatory Visit: Payer: Self-pay

## 2024-10-15 ENCOUNTER — Encounter: Payer: Self-pay | Admitting: Allergy

## 2024-10-15 ENCOUNTER — Other Ambulatory Visit (HOSPITAL_BASED_OUTPATIENT_CLINIC_OR_DEPARTMENT_OTHER): Payer: Self-pay

## 2024-10-15 ENCOUNTER — Ambulatory Visit: Payer: No Typology Code available for payment source | Admitting: Allergy and Immunology

## 2024-10-15 ENCOUNTER — Other Ambulatory Visit: Payer: Self-pay

## 2024-10-15 VITALS — BP 124/86 | HR 75 | Temp 98.3°F | Resp 14 | Ht 62.0 in | Wt 162.7 lb

## 2024-10-15 DIAGNOSIS — J3089 Other allergic rhinitis: Secondary | ICD-10-CM | POA: Diagnosis not present

## 2024-10-15 DIAGNOSIS — J4520 Mild intermittent asthma, uncomplicated: Secondary | ICD-10-CM | POA: Diagnosis not present

## 2024-10-15 DIAGNOSIS — R232 Flushing: Secondary | ICD-10-CM | POA: Diagnosis not present

## 2024-10-15 DIAGNOSIS — R400 Somnolence: Secondary | ICD-10-CM

## 2024-10-15 DIAGNOSIS — L501 Idiopathic urticaria: Secondary | ICD-10-CM | POA: Diagnosis not present

## 2024-10-15 DIAGNOSIS — K219 Gastro-esophageal reflux disease without esophagitis: Secondary | ICD-10-CM

## 2024-10-15 DIAGNOSIS — J383 Other diseases of vocal cords: Secondary | ICD-10-CM

## 2024-10-15 DIAGNOSIS — R0683 Snoring: Secondary | ICD-10-CM

## 2024-10-15 MED ORDER — FAMOTIDINE 20 MG PO TABS
20.0000 mg | ORAL_TABLET | Freq: Every day | ORAL | 1 refills | Status: AC | PRN
  Filled 2024-10-15: qty 90, 90d supply, fill #0

## 2024-10-15 MED ORDER — ALBUTEROL SULFATE HFA 108 (90 BASE) MCG/ACT IN AERS
2.0000 | INHALATION_SPRAY | Freq: Four times a day (QID) | RESPIRATORY_TRACT | 2 refills | Status: AC | PRN
  Filled 2024-10-15: qty 6.7, 25d supply, fill #0

## 2024-10-15 MED ORDER — IPRATROPIUM BROMIDE 0.06 % NA SOLN
NASAL | 5 refills | Status: AC
  Filled 2024-10-15: qty 15, 30d supply, fill #0

## 2024-10-15 MED ORDER — BUDESONIDE 0.25 MG/2ML IN SUSP
0.2500 mg | Freq: Two times a day (BID) | RESPIRATORY_TRACT | 1 refills | Status: AC | PRN
  Filled 2024-10-15: qty 120, 30d supply, fill #0

## 2024-10-15 MED ORDER — AUVI-Q 0.3 MG/0.3ML IJ SOAJ
0.3000 mg | INTRAMUSCULAR | 1 refills | Status: AC | PRN
  Filled 2024-10-15: qty 2, 30d supply, fill #0

## 2024-10-15 MED ORDER — AZELASTINE HCL 0.1 % NA SOLN
NASAL | 5 refills | Status: AC
  Filled 2024-10-15: qty 30, 30d supply, fill #0

## 2024-10-17 ENCOUNTER — Other Ambulatory Visit: Payer: Self-pay | Admitting: Family

## 2024-10-17 ENCOUNTER — Other Ambulatory Visit (HOSPITAL_BASED_OUTPATIENT_CLINIC_OR_DEPARTMENT_OTHER): Payer: Self-pay

## 2024-10-17 DIAGNOSIS — G43709 Chronic migraine without aura, not intractable, without status migrainosus: Secondary | ICD-10-CM

## 2024-10-17 MED ORDER — SUMATRIPTAN SUCCINATE 50 MG PO TABS
ORAL_TABLET | ORAL | 3 refills | Status: AC
  Filled 2024-10-17: qty 10, 5d supply, fill #0
  Filled 2024-10-18: qty 10, 30d supply, fill #0

## 2024-10-18 ENCOUNTER — Other Ambulatory Visit (HOSPITAL_BASED_OUTPATIENT_CLINIC_OR_DEPARTMENT_OTHER): Payer: Self-pay

## 2024-10-18 LAB — TRYPTASE: Tryptase: 4.4 ug/L (ref 2.2–13.2)

## 2024-10-18 LAB — ALLERGEN EGG WHITE F1: Egg White IgE: <0.1 kU/L

## 2024-10-29 ENCOUNTER — Ambulatory Visit: Admitting: Family Medicine

## 2024-11-05 ENCOUNTER — Ambulatory Visit: Admitting: Pulmonary Disease

## 2024-12-09 ENCOUNTER — Ambulatory Visit: Admitting: Physician Assistant

## 2025-04-15 ENCOUNTER — Ambulatory Visit: Admitting: Allergy and Immunology
